# Patient Record
Sex: Female | Born: 1965 | Race: White | Hispanic: No | Marital: Married | Smoking: Never smoker
Health system: Southern US, Community
[De-identification: ages and names within clinical notes are randomized; demographics above are authoritative.]

## PROBLEM LIST (undated history)

## (undated) DIAGNOSIS — R51 Headache: Secondary | ICD-10-CM

## (undated) DIAGNOSIS — C801 Malignant (primary) neoplasm, unspecified: Secondary | ICD-10-CM

## (undated) DIAGNOSIS — R112 Nausea with vomiting, unspecified: Secondary | ICD-10-CM

## (undated) DIAGNOSIS — K219 Gastro-esophageal reflux disease without esophagitis: Secondary | ICD-10-CM

## (undated) DIAGNOSIS — J45909 Unspecified asthma, uncomplicated: Secondary | ICD-10-CM

## (undated) DIAGNOSIS — K625 Hemorrhage of anus and rectum: Secondary | ICD-10-CM

## (undated) DIAGNOSIS — M67439 Ganglion, unspecified wrist: Secondary | ICD-10-CM

## (undated) DIAGNOSIS — E7800 Pure hypercholesterolemia, unspecified: Secondary | ICD-10-CM

## (undated) DIAGNOSIS — T83711A Erosion of implanted vaginal mesh and other prosthetic materials to surrounding organ or tissue, initial encounter: Secondary | ICD-10-CM

## (undated) DIAGNOSIS — M359 Systemic involvement of connective tissue, unspecified: Secondary | ICD-10-CM

## (undated) DIAGNOSIS — I10 Essential (primary) hypertension: Secondary | ICD-10-CM

## (undated) DIAGNOSIS — M6530 Trigger finger, unspecified finger: Secondary | ICD-10-CM

## (undated) DIAGNOSIS — M1990 Unspecified osteoarthritis, unspecified site: Secondary | ICD-10-CM

## (undated) DIAGNOSIS — Z9889 Other specified postprocedural states: Secondary | ICD-10-CM

## (undated) DIAGNOSIS — M069 Rheumatoid arthritis, unspecified: Secondary | ICD-10-CM

## (undated) DIAGNOSIS — M797 Fibromyalgia: Secondary | ICD-10-CM

## (undated) HISTORY — DX: Hemorrhage of anus and rectum: K62.5

## (undated) HISTORY — DX: Rheumatoid arthritis, unspecified: M06.9

## (undated) HISTORY — DX: Pure hypercholesterolemia, unspecified: E78.00

## (undated) HISTORY — DX: Fibromyalgia: M79.7

## (undated) HISTORY — PX: OTHER SURGICAL HISTORY: SHX169

## (undated) HISTORY — DX: Unspecified osteoarthritis, unspecified site: M19.90

## (undated) HISTORY — PX: VAGINA RECONSTRUCTION SURGERY: SHX828

## (undated) HISTORY — DX: Essential (primary) hypertension: I10

---

## 1990-12-10 HISTORY — PX: TUBAL LIGATION: SHX77

## 1992-12-10 HISTORY — PX: ABDOMINAL HYSTERECTOMY: SHX81

## 1993-12-10 HISTORY — PX: ECTOPIC PREGNANCY SURGERY: SHX613

## 1994-12-10 HISTORY — PX: SALPINGOOPHORECTOMY: SHX82

## 1994-12-10 HISTORY — PX: APPENDECTOMY: SHX54

## 2001-08-04 ENCOUNTER — Encounter: Payer: Self-pay | Admitting: Emergency Medicine

## 2001-08-04 ENCOUNTER — Emergency Department (HOSPITAL_COMMUNITY): Admission: EM | Admit: 2001-08-04 | Discharge: 2001-08-04 | Payer: Self-pay | Admitting: Emergency Medicine

## 2001-08-20 ENCOUNTER — Emergency Department (HOSPITAL_COMMUNITY): Admission: EM | Admit: 2001-08-20 | Discharge: 2001-08-20 | Payer: Self-pay | Admitting: *Deleted

## 2001-08-20 ENCOUNTER — Encounter: Payer: Self-pay | Admitting: Emergency Medicine

## 2004-07-28 ENCOUNTER — Ambulatory Visit (HOSPITAL_COMMUNITY): Admission: RE | Admit: 2004-07-28 | Discharge: 2004-07-28 | Payer: Self-pay | Admitting: Family Medicine

## 2006-04-01 ENCOUNTER — Emergency Department (HOSPITAL_COMMUNITY): Admission: EM | Admit: 2006-04-01 | Discharge: 2006-04-01 | Payer: Self-pay | Admitting: Emergency Medicine

## 2006-07-24 ENCOUNTER — Emergency Department (HOSPITAL_COMMUNITY): Admission: EM | Admit: 2006-07-24 | Discharge: 2006-07-24 | Payer: Self-pay | Admitting: Emergency Medicine

## 2006-08-22 ENCOUNTER — Emergency Department (HOSPITAL_COMMUNITY): Admission: EM | Admit: 2006-08-22 | Discharge: 2006-08-22 | Payer: Self-pay | Admitting: Emergency Medicine

## 2006-09-06 ENCOUNTER — Observation Stay (HOSPITAL_COMMUNITY): Admission: RE | Admit: 2006-09-06 | Discharge: 2006-09-07 | Payer: Self-pay | Admitting: General Surgery

## 2006-09-06 HISTORY — PX: VENTRAL HERNIA REPAIR: SHX424

## 2006-12-31 ENCOUNTER — Ambulatory Visit (HOSPITAL_COMMUNITY): Admission: RE | Admit: 2006-12-31 | Discharge: 2006-12-31 | Payer: Self-pay | Admitting: Obstetrics & Gynecology

## 2007-01-02 ENCOUNTER — Ambulatory Visit (HOSPITAL_COMMUNITY): Admission: RE | Admit: 2007-01-02 | Discharge: 2007-01-02 | Payer: Self-pay | Admitting: Obstetrics and Gynecology

## 2007-04-01 ENCOUNTER — Ambulatory Visit (HOSPITAL_BASED_OUTPATIENT_CLINIC_OR_DEPARTMENT_OTHER): Admission: RE | Admit: 2007-04-01 | Discharge: 2007-04-01 | Payer: Self-pay | Admitting: Urology

## 2007-04-01 HISTORY — PX: CYSTOSCOPY WITH HYDRODISTENSION AND BIOPSY: SHX5127

## 2007-06-23 ENCOUNTER — Emergency Department (HOSPITAL_COMMUNITY): Admission: EM | Admit: 2007-06-23 | Discharge: 2007-06-23 | Payer: Self-pay | Admitting: Emergency Medicine

## 2008-01-08 ENCOUNTER — Other Ambulatory Visit: Admission: RE | Admit: 2008-01-08 | Discharge: 2008-01-08 | Payer: Self-pay | Admitting: Obstetrics and Gynecology

## 2008-02-12 ENCOUNTER — Emergency Department (HOSPITAL_COMMUNITY): Admission: EM | Admit: 2008-02-12 | Discharge: 2008-02-12 | Payer: Self-pay | Admitting: Emergency Medicine

## 2008-12-10 HISTORY — PX: PUBOVAGINAL SLING: SHX1035

## 2009-01-27 ENCOUNTER — Encounter (HOSPITAL_COMMUNITY): Admission: RE | Admit: 2009-01-27 | Discharge: 2009-02-26 | Payer: Self-pay | Admitting: Internal Medicine

## 2010-02-20 ENCOUNTER — Ambulatory Visit: Payer: Self-pay | Admitting: Orthopedic Surgery

## 2010-02-20 DIAGNOSIS — M25569 Pain in unspecified knee: Secondary | ICD-10-CM | POA: Insufficient documentation

## 2010-02-20 DIAGNOSIS — M069 Rheumatoid arthritis, unspecified: Secondary | ICD-10-CM | POA: Insufficient documentation

## 2010-02-20 DIAGNOSIS — IMO0001 Reserved for inherently not codable concepts without codable children: Secondary | ICD-10-CM | POA: Insufficient documentation

## 2010-02-20 LAB — CONVERTED CEMR LAB
Anti Nuclear Antibody(ANA): NEGATIVE
CRP: 1.2 mg/dL — ABNORMAL HIGH (ref <0.6)
Rhuematoid fact SerPl-aCnc: <20 intl units/mL (ref 0–20)
Sed Rate: 12 mm/hr (ref 0–22)

## 2010-02-21 ENCOUNTER — Telehealth: Payer: Self-pay | Admitting: Orthopedic Surgery

## 2010-02-22 ENCOUNTER — Encounter: Payer: Self-pay | Admitting: Orthopedic Surgery

## 2010-02-28 ENCOUNTER — Encounter (HOSPITAL_COMMUNITY): Admission: RE | Admit: 2010-02-28 | Discharge: 2010-03-30 | Payer: Self-pay | Admitting: Orthopedic Surgery

## 2010-03-01 ENCOUNTER — Telehealth: Payer: Self-pay | Admitting: Orthopedic Surgery

## 2010-03-09 ENCOUNTER — Ambulatory Visit: Payer: Self-pay | Admitting: Orthopedic Surgery

## 2010-03-10 ENCOUNTER — Encounter (INDEPENDENT_AMBULATORY_CARE_PROVIDER_SITE_OTHER): Payer: Self-pay | Admitting: *Deleted

## 2010-03-14 ENCOUNTER — Encounter: Payer: Self-pay | Admitting: Orthopedic Surgery

## 2010-03-15 ENCOUNTER — Encounter: Payer: Self-pay | Admitting: Orthopedic Surgery

## 2010-03-16 ENCOUNTER — Ambulatory Visit (HOSPITAL_COMMUNITY): Admission: RE | Admit: 2010-03-16 | Discharge: 2010-03-16 | Payer: Self-pay | Admitting: Internal Medicine

## 2010-03-23 ENCOUNTER — Ambulatory Visit (HOSPITAL_COMMUNITY): Admission: RE | Admit: 2010-03-23 | Discharge: 2010-03-23 | Payer: Self-pay | Admitting: Neurology

## 2010-04-04 ENCOUNTER — Telehealth: Payer: Self-pay | Admitting: Orthopedic Surgery

## 2010-04-24 ENCOUNTER — Telehealth: Payer: Self-pay | Admitting: Orthopedic Surgery

## 2011-01-09 NOTE — Progress Notes (Signed)
Summary: no reply for referral appointment  Phone Note Other Incoming   Summary of Call: Someone from [facility] ( did not leave a name) left a message that they have left messages for Debbrah Sampedro to call their office to schedule an appointment.  So far there has been bo reply to the messages Initial call taken by: Jacklynn Ganong,  April 04, 2010 4:19 PM

## 2011-01-09 NOTE — Assessment & Plan Note (Signed)
Summary: BILAT KNEE PAIN/FILMS HERE/?NEW XRAY/NOTES REV'D,APPR'D DR H/...   Visit Type:  Initial Consult  CC:  bilateral knee pian.  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  Patient reports LEFT knee pain worse than RIGHT for over a year.  She does get some relief from tramadol.  She complains of swelling in her knees with stabbing pain whic  It's  an 8/10 it is constant.  It's worse when she gets up in the morning and last until she goes to bed.  It came on gradually she seems to get relief by soaking her feet and the child were soaking in its or sitting or lying down it's worse with bending and getting on her knees and walking.  She has associated numbness and tingling.  Previous treatment includes anti-inflammatory medications which included Voltaren 75 mg.  She is on Ultram 50 mg.  She had bilateral injections and she said she couldn't walk for one week after the shots.  This made her unhappy and she wanted to transfer care at that point.  She had x-rays of both knees according to the 01/02/10 no  Medications: Clonazepam, Tramadol, V-dot.  Allergies (verified): 1)  ! Darvocet  Past History:  Past Medical History: IC  Past Surgical History: Cholecystectomy  Review of Systems Constitutional:  Complains of weight loss and fatigue; denies weight gain, fever, and chills. Cardiovascular:  Denies chest pain, palpitations, fainting, and murmurs. Respiratory:  Denies short of breath, wheezing, couch, tightness, pain on inspiration, and snoring . Gastrointestinal:  Complains of heartburn; denies nausea, vomiting, diarrhea, constipation, and blood in your stools. Genitourinary:  Complains of frequency and urgency; denies difficulty urinating, painful urination, flank pain, and bleeding in urine. Neurologic:  Complains of numbness and  tingling; denies unsteady gait, dizziness, tremors, and seizure. Musculoskeletal:  Complains of joint pain, swelling, and stiffness; denies instability, redness, heat, and muscle pain. Endocrine:  Denies excessive thirst, exessive urination, and heat or cold intolerance. Psychiatric:  Complains of depression; denies nervousness, anxiety, and hallucinations. Skin:  Denies changes in the skin, poor healing, rash, itching, and redness. HEENT:  Denies blurred or double vision, eye pain, redness, and watering. Immunology:  Complains of seasonal allergies; denies sinus problems and allergic to bee stings. Hemoatologic:  Denies easy bleeding and brusing.  Physical Exam  Additional Exam:   VS reviewed and were normal  GEN: appearance was normal body habitus was endomorphic, medium  CDV: normal pulses temperature and no edema  LYMPH nodes were normal   SKIN was normal   Neuro: normal sensation, reflexes were normal, balance was normal, coordination was normal Psyche: AAO x 3 and mood was normal   MSK *Gait was normal   Bilateral knee examination she was tender over both knee joints surrounding the patella and both joint lines and really just everywhere.  There was no swelling there was no fullness to the synovium.  Her range of motion was full she had normal strength there is no instability.  The only abnormal finding was that she went into hyperextension of about 10 with both knees.      Impression & Recommendations:  Problem # 1:  RHEUMATOID ARTHRITIS (ICD-714.0) Assessment New  Orders: T-Antinuclear Antib (ANA) [phone]) T-C-Reactive Protein [phone]) T-Rheumatoid Factor [phone]) T-Sed Rate (Automated) [phone]) New Patient Level III (28413)  Problem # 2:  KNEE PAIN (KGM-010.27) Assessment: New  Orders: New Patient Level III (16109)  Problem # 3:  FIBROMYALGIA (ICD-729.1) Assessment: New  differential diagnoses and assessment  The patient's x-rays  are normal I've seen him I do not see a reason for her pain.  She has no joint swelling.  I think she should at least have a rheumatoid panel, a bone scan and then reassess the situation after 12 days of a steroid Dosepak.  If no relief things reveal a diagnosis then I would get rheumatologic and neurologic consultations.  At this point though I do not see any surgical problem here area the patient says she is depressed because her mom recently past but her symptoms started before then.  Orders: New Patient Level III (60454)  Patient Instructions: 1)  Bone scan come back in 2 weeks 2)  Rheumatoid panel  3)  Prednisone dose pack

## 2011-01-09 NOTE — Progress Notes (Signed)
Summary: unable to contact patient  Phone Note Other Incoming   Summary of Call: Donnie/[facility] had been unable to reach Viviano Simas to schedule an appointment with Dr. Corliss Skains. Will shred all paperwork from our office. Initial call taken by: Jacklynn Ganong,  Apr 24, 2010 2:24 PM

## 2011-01-09 NOTE — Letter (Signed)
Summary: Previous notes brought by the patient  Previous notes brought by the patient   Imported By: Jacklynn Ganong 02/23/2010 08:18:01  _____________________________________________________________________  External Attachment:    Type:   Image     Comment:   External Document

## 2011-01-09 NOTE — Consult Note (Signed)
Summary: Consult notes from Dr. Gerilyn Pilgrim  Consult notes from Dr. Gerilyn Pilgrim   Imported By: Jacklynn Ganong 03/16/2010 15:48:21  _____________________________________________________________________  External Attachment:    Type:   Image     Comment:   External Document

## 2011-01-09 NOTE — Assessment & Plan Note (Signed)
Summary: bone scan results/bcbs/bsf    Visit Type:  Follow-up  CC:  bilateral knee pain.Marland Kitchen  History of Present Illness: see previous history for details but patient came in with bilateral knee pain, multiple joint aches, and numbness and tingling in her legs with some back pain as well  I sent her for rheumatology workup it was negative, I sent her for a bone scan and we only saw uptake in her shoulders.  I explained the results to her she did not have any other questions.  I advised her that she should see a rheumatologist to evaluate her for fibromyalgia.  She does not need any surgery at this time.  Data reviewed includes laboratory studies, bone scan with report.   Medications: Clonazepam, Tramadol, V-dot.    Allergies: 1)  ! Darvocet   Impression & Recommendations:  Problem # 1:  FIBROMYALGIA (ICD-729.1) Assessment Unchanged  Orders: Neurology Referral (Neuro) Rheumatology Referral (Rheumatology) Est. Patient Level II (36144)  Patient Instructions: 1)  Referral to Neurology and Rheumatology

## 2011-01-09 NOTE — Letter (Signed)
Summary: History form  History form   Imported By: [name] 02/23/2010 08:17:11  _____________________________________________________________________  External Attachment:    Type:   Image     Comment:   External Document

## 2011-01-09 NOTE — Progress Notes (Signed)
Summary: call from patient about results and to report back hurting  Phone Note Call from Patient   Caller: Patient Summary of Call: Patient called, left voice mail msg, relaying that her back is hurting after "they made me unload the truck at work".  States knows that she will be called with results of her bone scan.  Her ph# [phone]. Initial call taken by: Cammie Sickle,  March 01, 2010 3:34 PM  Follow-up for Phone Call        Patient Instructions: 1)  Bone scan come back in 2 weeks 2)  Rheumatoid panel  3)  Prednisone dose pack    not sure what this note means but she should be coming back wk of the 28th  Follow-up by: Fuller Canada MD,  March 02, 2010 7:54 AM  Additional Follow-up for Phone Call Additional follow up Details #1::        Appointment scheduled for 03/09/10,confirmed. Additional Follow-up by: Cammie Sickle,  March 03, 2010 4:04 PM

## 2011-01-09 NOTE — Medication Information (Signed)
Summary: Tax adviser   Imported By: Cammie Sickle 03/23/2010 08:47:31  _____________________________________________________________________  External Attachment:    Type:   Image     Comment:   External Document

## 2011-01-09 NOTE — Miscellaneous (Signed)
Summary: faxed notes for rheumatoid and neurologist referral  Clinical Lists Changes

## 2011-01-09 NOTE — Progress Notes (Signed)
Summary: having pain in knees and numbness in toes  Phone Note Call from Patient Call back at Home Phone [phone]   Summary of Call: patient states that she is having alot of pain with her knees and numbness in her toes since her examination yesterday, she started her prednisone pack this am, she just wanted to let you know Initial call taken by: Ether Griffins,  February 21, 2010 10:58 AM

## 2011-01-17 ENCOUNTER — Emergency Department (HOSPITAL_COMMUNITY)
Admission: EM | Admit: 2011-01-17 | Discharge: 2011-01-17 | Disposition: A | Discharge status: Home / Self Care | Payer: BC Managed Care – PPO | Attending: Emergency Medicine | Admitting: Emergency Medicine

## 2011-01-17 DIAGNOSIS — R110 Nausea: Secondary | ICD-10-CM | POA: Insufficient documentation

## 2011-01-17 DIAGNOSIS — Z79899 Other long term (current) drug therapy: Secondary | ICD-10-CM | POA: Insufficient documentation

## 2011-01-17 DIAGNOSIS — I2510 Atherosclerotic heart disease of native coronary artery without angina pectoris: Secondary | ICD-10-CM | POA: Insufficient documentation

## 2011-01-17 DIAGNOSIS — R339 Retention of urine, unspecified: Secondary | ICD-10-CM | POA: Insufficient documentation

## 2011-01-17 LAB — CBC
HCT: 31.5 % — ABNORMAL LOW (ref 36.0–46.0)
Hemoglobin: 10.9 g/dL — ABNORMAL LOW (ref 12.0–15.0)
MCH: 33.3 pg (ref 26.0–34.0)
MCHC: 34.6 g/dL (ref 30.0–36.0)
MCV: 96.3 fL (ref 78.0–100.0)
Platelets: 189 10*3/uL (ref 150–400)
RBC: 3.27 MIL/uL — ABNORMAL LOW (ref 3.87–5.11)
RDW: 13 % (ref 11.5–15.5)
WBC: 10.4 10*3/uL (ref 4.0–10.5)

## 2011-01-17 LAB — URINALYSIS, ROUTINE W REFLEX MICROSCOPIC
Bilirubin Urine: NEGATIVE
Hgb urine dipstick: NEGATIVE
Ketones, ur: NEGATIVE mg/dL
Nitrite: NEGATIVE
Protein, ur: NEGATIVE mg/dL
Specific Gravity, Urine: 1.015 (ref 1.005–1.030)
Urine Glucose, Fasting: NEGATIVE mg/dL
Urobilinogen, UA: 0.2 mg/dL (ref 0.0–1.0)
pH: 6 (ref 5.0–8.0)

## 2011-01-17 LAB — BASIC METABOLIC PANEL
BUN: 12 mg/dL (ref 6–23)
CO2: 28 mEq/L (ref 19–32)
Calcium: 8.8 mg/dL (ref 8.4–10.5)
Chloride: 105 mEq/L (ref 96–112)
Creatinine, Ser: 0.75 mg/dL (ref 0.4–1.2)
GFR calc Af Amer: >60 mL/min (ref >60)
GFR calc non Af Amer: >60 mL/min (ref >60)
Glucose, Bld: 110 mg/dL — ABNORMAL HIGH (ref 70–99)
Potassium: 4.4 mEq/L (ref 3.5–5.1)
Sodium: 139 mEq/L (ref 135–145)

## 2011-01-17 LAB — DIFFERENTIAL
Basophils Absolute: 0 10*3/uL (ref 0.0–0.1)
Basophils Relative: 0 % (ref 0–1)
Eosinophils Absolute: 0.1 10*3/uL (ref 0.0–0.7)
Eosinophils Relative: 1 % (ref 0–5)
Lymphocytes Relative: 30 % (ref 12–46)
Lymphs Abs: 3.1 10*3/uL (ref 0.7–4.0)
Monocytes Absolute: 0.7 10*3/uL (ref 0.1–1.0)
Monocytes Relative: 7 % (ref 3–12)
Neutro Abs: 6.5 10*3/uL (ref 1.7–7.7)
Neutrophils Relative %: 63 % (ref 43–77)

## 2011-02-16 ENCOUNTER — Ambulatory Visit (HOSPITAL_COMMUNITY)
Admission: RE | Admit: 2011-02-16 | Discharge: 2011-02-16 | Disposition: A | Discharge status: Home / Self Care | Payer: BC Managed Care – PPO | Source: Ambulatory Visit | Attending: Internal Medicine | Admitting: Internal Medicine

## 2011-02-16 ENCOUNTER — Other Ambulatory Visit (HOSPITAL_COMMUNITY): Payer: Self-pay | Admitting: Internal Medicine

## 2011-02-16 DIAGNOSIS — Z139 Encounter for screening, unspecified: Secondary | ICD-10-CM

## 2011-02-16 DIAGNOSIS — R0602 Shortness of breath: Secondary | ICD-10-CM | POA: Insufficient documentation

## 2011-03-20 ENCOUNTER — Ambulatory Visit (HOSPITAL_COMMUNITY)
Admission: RE | Admit: 2011-03-20 | Discharge: 2011-03-20 | Disposition: A | Discharge status: Home / Self Care | Payer: BC Managed Care – PPO | Source: Ambulatory Visit | Attending: Internal Medicine | Admitting: Internal Medicine

## 2011-03-20 DIAGNOSIS — Z1231 Encounter for screening mammogram for malignant neoplasm of breast: Secondary | ICD-10-CM | POA: Insufficient documentation

## 2011-03-20 DIAGNOSIS — Z139 Encounter for screening, unspecified: Secondary | ICD-10-CM

## 2011-04-24 NOTE — Procedures (Signed)
[name], [name]                ACCOUNT NO.:  [id number]   MEDICAL RECORD NO.:  [id number]          PATIENT TYPE:  REC   LOCATION:  RAD                           FACILITY:  [facility]   PHYSICIAN:  Kingsley Callander. Ouida Sills, MD       DATE OF BIRTH:  10/13/1966   DATE OF PROCEDURE:  DATE OF DISCHARGE:                                  STRESS TEST   MYOVIEW STRESS TEST REPORT   [name] underwent a Myoview stress test for evaluation of chest pain.  She exercised 10 minutes 2 seconds (1 minute 2 seconds into stage IV of  the Bruce protocol) attaining a maximal heart rate of 178 (99% of the  age-predicted maximal heart rate) at a workload of 12.8 METS and  discontinued exercise due to fatigue.  She experienced chest tightness  early in stage I.  Symptoms resolved during exercise.  There were no  arrhythmias.  There were no ST segment changes diagnostic of ischemia.  The baseline electrocardiogram revealed normal sinus rhythm at 74 beats  per minute with poor R-wave progression.   IMPRESSION:  No evidence of exercise-induced ischemia.  Myoview images  pending.      Kingsley Callander. Ouida Sills, MD  Electronically Signed     ROF/MEDQ  D:  01/27/2009  T:  01/27/2009  Job:  [id number]

## 2011-04-27 NOTE — Op Note (Signed)
[name], [name]                ACCOUNT NO.:  [id number]   MEDICAL RECORD NO.:  [id number]          PATIENT TYPE:  OBV   LOCATION:  A305                          FACILITY:  [facility]   PHYSICIAN:  Dalia Heading, M.D.  DATE OF BIRTH:  March 08, 1966   DATE OF PROCEDURE:  09/06/2006  DATE OF DISCHARGE:  09/07/2006                                 OPERATIVE REPORT   PREOPERATIVE DIAGNOSIS:  Ventral hernia.   POSTOPERATIVE DIAGNOSIS:  Ventral hernia, right spigelian hernia   PROCEDURE:  Ventral herniorrhaphy with mesh.   SURGEON:  Dr. Franky Macho   ANESTHESIA:  General endotracheal.   INDICATIONS:  The patient is a 45 year old white female who is referred for  evaluation and treatment of a right lower quadrant hernia.  It has been  present for several months and recently has been increasing in size and  causing her discomfort.  It appears to be a right spigelian hernia.  The  patient now comes to the operating room for a ventral herniorrhaphy with  mesh.  The risks and benefits of the procedure including bleeding,  infection, pain, recurrence of the hernia were fully explained to the  patient, gave informed consent.   PROCEDURE NOTE:  The patient was placed in the supine position.  After  induction of general endotracheal anesthesia, the abdomen was prepped and  draped in the usual sterile technique with Betadine.  Surgical site  confirmation was performed.   An oblique incision was made in the right lower quadrant region.  This was  taken down to the hernia defect.  The patient was noted to have  properitoneal fat herniating through the external oblique aponeurosis.  This  appeared be close to the semilunar line.  The excess adipose tissue was  excised and removed from the operative field.  A small 1.5 to 2 cm oblong  defect was noted in the external oblique.  This was repaired using a small  size ultra pro mesh.  The external portion of the mesh was secured to the  external oblique  aponeurosis using 2-0 Novofil interrupted sutures.  Subcutaneous layers were reapproximated using a 2-0 Vicryl interrupted  suture.  The skin was closed using staples.  0.5 cm Sensorcaine was  instilled into the surrounding wound.  Betadine ointment and dry sterile  dressing were applied.   All sponge and needle counts correct and the end of the procedure.  The  patient was extubated in the operating room, went back to recovery room  awake and in stable condition.  Complications none.   SPECIMEN:  Properitoneal fat, hernia contents.   BLOOD LOSS:  Minimal.      Dalia Heading, M.D.  Electronically Signed     MAJ/MEDQ  D:  09/06/2006  T:  09/08/2006  Job:  [id number]   cc:   Kirk Ruths, M.D.  Fax: [phone]   Dalia Heading, M.D.  Fax: [phone]

## 2011-04-27 NOTE — H&P (Signed)
NAMEJOVANI, [name]                ACCOUNT NO.:  [id number]   MEDICAL RECORD NO.:  [id number]          PATIENT TYPE:  AMB   LOCATION:  DAY                           FACILITY:  [facility]   PHYSICIAN:  Dalia Heading, M.D.  DATE OF BIRTH:  10/28/1966   DATE OF ADMISSION:  DATE OF DISCHARGE:  LH                                HISTORY & PHYSICAL   CHIEF COMPLAINT:  Ventral hernia.   HISTORY OF PRESENT ILLNESS:  The patient is a 45 year old white female who  is referred for and evaluation treatment of a right lower quadrant hernia.  It has been present for several months and recently has enlarged in size and  is causing her discomfort.  No nausea or vomiting had been noted.  It is  made worse with straining.   PAST MEDICAL HISTORY:  Unremarkable.   PAST SURGICAL HISTORY:  Hysterectomy, oophorectomy, laparoscopic  cholecystectomy, appendectomy.   CURRENT MEDICATIONS:  __________  1 tablet p.o. q.d.   ALLERGIES:  DARVOCET.   REVIEW OF SYSTEMS:  The patient denies drinking or smoking.   PHYSICAL EXAMINATION:  GENERAL:  The patient is a well-developed, well-  nourished white female in no acute distress.  She is a afebrile.  VITAL SIGNS:  Stable.  LUNGS:  Clear to auscultation with equal breath sounds bilaterally.  HEART:  Examination reveals regular a rate and rhythm without S3, S4,  murmurs.  ABDOMEN:  Soft, nontender, nondistended.  No hepatosplenomegaly or masses  noted.  Reducible right spigelian hernia is noted.   IMPRESSION:  Right spigelian hernia.   PLAN:  The patient is scheduled for ventral herniorrhaphy with mesh on  09/06/2006.  The risks and benefits of the procedure including bleeding,  infection, pain and recurrence of the hernia were fully explained to the  patient, gave informed consent.      Dalia Heading, M.D.  Electronically Signed     MAJ/MEDQ  D:  08/29/2006  T:  09/01/2006  Job:  [id number]   cc:   Dalia Heading, M.D.  Fax: [phone]   Kirk Ruths, M.D.  Fax: [phone]   Short Stay - [facility]

## 2011-04-27 NOTE — Op Note (Signed)
[name], [name]                ACCOUNT NO.:  [id number]   MEDICAL RECORD NO.:  [id number]          PATIENT TYPE:  AMB   LOCATION:  [facility]                         FACILITY:  [facility]   PHYSICIAN:  Jamison Neighbor, M.D.  DATE OF BIRTH:  [birth year]   DATE OF PROCEDURE:  04/01/2007  DATE OF DISCHARGE:                               OPERATIVE REPORT   PREOPERATIVE DIAGNOSES:  1. Microscopic hematuria.  2. Chronic pelvic pain consistent with interstitial cystitis.   POSTOPERATIVE DIAGNOSES:  1. Microscopic hematuria.  2. Chronic pelvic pain consistent with interstitial cystitis.   PROCEDURE:  Cystoscopy, urethral calibration, hydrodistention of the  bladder, Marcaine and Pyridium installation, Marcaine and Kenalog  injection.   SURGEON:  Dr. Logan Bores.   ANESTHESIA:  General.   COMPLICATIONS:  None.   DRAINS:  None.   BRIEF HISTORY:  This 45 year old female has had problems with urgency  and frequency as well as urgency incontinence.  She underwent urodynamic  testing.  This showed evidence of sensory urgency but no detrusor  instability.  The patient has reasonably good support for the urethra  and only a modest decrease in her leak-point pressure.  The patient also  has chronic pelvic pain and microscopic hematuria.  In order to  determine if she has interstitial cystitis, we made the decision to  proceed with cystoscopy, hydrodistention of the bladder, and retrograde  studies to evaluate the hematuria.  She understands the risks and  benefits of the procedure and gave full informed consent.   PROCEDURE:  After successful induction of general anesthesia, the  patient was placed in the dorsal lithotomy position, prepped with  Betadine and draped in the usual sterile fashion.  Careful bimanual  examination revealed a modest cystocele but not much in the way of  rectocele or vault prolapse.  There was some urethral hypermobility but  not significant prolapse.  The urethra was  calibrated and accepted a 63-  [place] female urethral sound with minimal stenosis and no stricturing.  The cystoscope was inserted.  The bladder was carefully inspected.  It  is free of any tumor or stones.  Both ureteral orifices are normal in  configuration and location.   Bilateral retrograde pyelograms were performed.  An acorn-tipped  catheter was placed into the right ureter.  Contrast was injected.  The  ureter was of normal caliber.  Some air bubbles did get in which caused  some irregularities at the upper portion of the ureter on the retrograde  pyelogram, but it was clear that there was no actual filling defect.  Collecting system itself was of normal caliber, very thin calyces, and  an intrarenal pelvis.  Drain-out films appeared normal, and it was felt  that that was a normal retrograde.  On the opposite side, the same  catheter was utilized.  The ureter was unremarkable in its appearance.  Collecting system was also free of any filling defect or obstruction,  and drain-out films were normal.  There certainly was no source for  hematuria on the retrograde studies.   Following completion of the retrograde, the patient underwent  hydrodistention of the bladder.  Bladder was distended at a pressure of  100 cm of water for 5 minutes.  When the bladder was drained,  granulations could be seen throughout the bladder consistent with  interstitial cystitis.  No ulcers could be seen.  There was nothing  requiring biopsy.  The bladder capacity was 600 mL which is almost  exactly the same as the average IC capacity under anesthesia of 575 and  significantly less than normal bladder capacity of 1150.  A mixture of  Marcaine and Pyridium was left in the bladder.  Marcaine and Kenalog  were injected periurethrally.  The patient tolerated the procedure and  was taken to the recovery in good condition.  She will be sent home with  Lorcet Plus, Pyridium Plus and doxycycline and return in  follow-up.  We  will then determine whether we should start her on instillation therapy,  but certainly she will be started on oral therapy for IC, and if she can  get that stabilized, we can certainly consider sling repair.           ______________________________  Jamison Neighbor, M.D.  Electronically Signed     RJE/MEDQ  D:  04/01/2007  T:  04/01/2007  Job:  [id number]

## 2011-07-09 ENCOUNTER — Other Ambulatory Visit (HOSPITAL_COMMUNITY): Payer: Self-pay | Admitting: Internal Medicine

## 2011-07-09 DIAGNOSIS — R52 Pain, unspecified: Secondary | ICD-10-CM

## 2011-07-10 ENCOUNTER — Ambulatory Visit (HOSPITAL_COMMUNITY): Payer: BC Managed Care – PPO

## 2011-07-10 ENCOUNTER — Ambulatory Visit (HOSPITAL_COMMUNITY)
Admission: RE | Admit: 2011-07-10 | Discharge: 2011-07-10 | Disposition: A | Discharge status: Home / Self Care | Payer: BC Managed Care – PPO | Source: Ambulatory Visit | Attending: Internal Medicine | Admitting: Internal Medicine

## 2011-07-10 DIAGNOSIS — N949 Unspecified condition associated with female genital organs and menstrual cycle: Secondary | ICD-10-CM | POA: Insufficient documentation

## 2011-07-10 DIAGNOSIS — R1031 Right lower quadrant pain: Secondary | ICD-10-CM | POA: Insufficient documentation

## 2011-07-10 DIAGNOSIS — R52 Pain, unspecified: Secondary | ICD-10-CM

## 2011-09-03 LAB — HEPATIC FUNCTION PANEL
ALT: 14
AST: 18
Albumin: 3.1 — ABNORMAL LOW
Alkaline Phosphatase: 51
Bilirubin, Direct: 0.1
Indirect Bilirubin: 0.8
Total Bilirubin: 0.9
Total Protein: 5.8 — ABNORMAL LOW

## 2012-02-18 ENCOUNTER — Other Ambulatory Visit: Payer: Self-pay | Admitting: Obstetrics & Gynecology

## 2012-02-18 DIAGNOSIS — Z139 Encounter for screening, unspecified: Secondary | ICD-10-CM

## 2012-03-21 ENCOUNTER — Ambulatory Visit (HOSPITAL_COMMUNITY)
Admission: RE | Admit: 2012-03-21 | Discharge: 2012-03-21 | Disposition: A | Discharge status: Home / Self Care | Payer: BC Managed Care – PPO | Source: Ambulatory Visit | Attending: Obstetrics & Gynecology | Admitting: Obstetrics & Gynecology

## 2012-03-21 DIAGNOSIS — Z1231 Encounter for screening mammogram for malignant neoplasm of breast: Secondary | ICD-10-CM | POA: Insufficient documentation

## 2012-03-21 DIAGNOSIS — Z139 Encounter for screening, unspecified: Secondary | ICD-10-CM

## 2012-12-10 DIAGNOSIS — M67439 Ganglion, unspecified wrist: Secondary | ICD-10-CM

## 2012-12-10 DIAGNOSIS — M6530 Trigger finger, unspecified finger: Secondary | ICD-10-CM

## 2012-12-10 HISTORY — DX: Ganglion, unspecified wrist: M67.439

## 2012-12-10 HISTORY — DX: Trigger finger, unspecified finger: M65.30

## 2012-12-11 ENCOUNTER — Encounter (HOSPITAL_BASED_OUTPATIENT_CLINIC_OR_DEPARTMENT_OTHER): Payer: Self-pay | Admitting: *Deleted

## 2012-12-17 ENCOUNTER — Encounter (HOSPITAL_BASED_OUTPATIENT_CLINIC_OR_DEPARTMENT_OTHER): Payer: Self-pay | Admitting: Anesthesiology

## 2012-12-17 ENCOUNTER — Ambulatory Visit (HOSPITAL_BASED_OUTPATIENT_CLINIC_OR_DEPARTMENT_OTHER): Payer: BC Managed Care – PPO | Admitting: Anesthesiology

## 2012-12-17 ENCOUNTER — Ambulatory Visit (HOSPITAL_BASED_OUTPATIENT_CLINIC_OR_DEPARTMENT_OTHER)
Admission: RE | Admit: 2012-12-17 | Discharge: 2012-12-17 | Disposition: A | Discharge status: Home / Self Care | Payer: BC Managed Care – PPO | Source: Ambulatory Visit | Attending: Orthopedic Surgery | Admitting: Orthopedic Surgery

## 2012-12-17 ENCOUNTER — Encounter (HOSPITAL_BASED_OUTPATIENT_CLINIC_OR_DEPARTMENT_OTHER): Payer: Self-pay | Admitting: Orthopedic Surgery

## 2012-12-17 ENCOUNTER — Encounter (HOSPITAL_BASED_OUTPATIENT_CLINIC_OR_DEPARTMENT_OTHER)
Admission: RE | Disposition: A | Discharge status: Home / Self Care | Payer: Self-pay | Source: Ambulatory Visit | Attending: Orthopedic Surgery

## 2012-12-17 DIAGNOSIS — M6530 Trigger finger, unspecified finger: Secondary | ICD-10-CM | POA: Insufficient documentation

## 2012-12-17 DIAGNOSIS — Z8261 Family history of arthritis: Secondary | ICD-10-CM | POA: Insufficient documentation

## 2012-12-17 DIAGNOSIS — J45909 Unspecified asthma, uncomplicated: Secondary | ICD-10-CM | POA: Insufficient documentation

## 2012-12-17 DIAGNOSIS — K219 Gastro-esophageal reflux disease without esophagitis: Secondary | ICD-10-CM | POA: Insufficient documentation

## 2012-12-17 DIAGNOSIS — M6740 Ganglion, unspecified site: Secondary | ICD-10-CM | POA: Insufficient documentation

## 2012-12-17 DIAGNOSIS — Z833 Family history of diabetes mellitus: Secondary | ICD-10-CM | POA: Insufficient documentation

## 2012-12-17 DIAGNOSIS — Z8249 Family history of ischemic heart disease and other diseases of the circulatory system: Secondary | ICD-10-CM | POA: Insufficient documentation

## 2012-12-17 HISTORY — DX: Gastro-esophageal reflux disease without esophagitis: K21.9

## 2012-12-17 HISTORY — DX: Headache: R51

## 2012-12-17 HISTORY — DX: Other specified postprocedural states: Z98.890

## 2012-12-17 HISTORY — PX: GANGLION CYST EXCISION: SHX1691

## 2012-12-17 HISTORY — DX: Nausea with vomiting, unspecified: R11.2

## 2012-12-17 HISTORY — DX: Erosion of implanted vaginal mesh to surrounding organ or tissue, initial encounter: T83.711A

## 2012-12-17 HISTORY — PX: TRIGGER FINGER RELEASE: SHX641

## 2012-12-17 HISTORY — DX: Ganglion, unspecified wrist: M67.439

## 2012-12-17 HISTORY — DX: Unspecified asthma, uncomplicated: J45.909

## 2012-12-17 HISTORY — DX: Trigger finger, unspecified finger: M65.30

## 2012-12-17 LAB — POCT HEMOGLOBIN-HEMACUE: Hemoglobin: 11.5 g/dL — ABNORMAL LOW (ref 12.0–15.0)

## 2012-12-17 SURGERY — RELEASE, A1 PULLEY, FOR TRIGGER FINGER
Anesthesia: General | Site: Wrist | Laterality: Left | Wound class: Clean

## 2012-12-17 MED ORDER — OXYCODONE HCL 5 MG/5ML PO SOLN: 5.0000 mg | Freq: Once | ORAL | Status: DC | PRN

## 2012-12-17 MED ORDER — HYDROCODONE-IBUPROFEN 7.5-200 MG PO TABS: 1.0000 | ORAL_TABLET | Freq: Three times a day (TID) | ORAL | Status: DC | PRN

## 2012-12-17 MED ORDER — BUPIVACAINE HCL (PF) 0.25 % IJ SOLN
INTRAMUSCULAR | Status: DC | PRN
  Administered 2012-12-17: 8 mL

## 2012-12-17 MED ORDER — MIDAZOLAM HCL 5 MG/5ML IJ SOLN
INTRAMUSCULAR | Status: DC | PRN
  Administered 2012-12-17: 2 mg via INTRAVENOUS

## 2012-12-17 MED ORDER — ONDANSETRON HCL 4 MG/2ML IJ SOLN
INTRAMUSCULAR | Status: DC | PRN
  Administered 2012-12-17: 4 mg via INTRAVENOUS

## 2012-12-17 MED ORDER — PROPOFOL 10 MG/ML IV BOLUS
INTRAVENOUS | Status: DC | PRN
  Administered 2012-12-17: 150 mg via INTRAVENOUS

## 2012-12-17 MED ORDER — OXYCODONE HCL 5 MG PO TABS: 5.0000 mg | ORAL_TABLET | Freq: Once | ORAL | Status: DC | PRN

## 2012-12-17 MED ORDER — CITRIC ACID-SODIUM CITRATE 334-500 MG/5ML PO SOLN: 30.0000 mL | Freq: Once | ORAL | Status: DC

## 2012-12-17 MED ORDER — SCOPOLAMINE 1 MG/3DAYS TD PT72
1.0000 | MEDICATED_PATCH | Freq: Once | TRANSDERMAL | Status: DC
  Administered 2012-12-17: 1.5 mg via TRANSDERMAL

## 2012-12-17 MED ORDER — HYDROMORPHONE HCL PF 1 MG/ML IJ SOLN
0.2500 mg | INTRAMUSCULAR | Status: DC | PRN
  Administered 2012-12-17: 0.5 mg via INTRAVENOUS
  Administered 2012-12-17: 0.25 mg via INTRAVENOUS
  Administered 2012-12-17: 0.5 mg via INTRAVENOUS

## 2012-12-17 MED ORDER — PROMETHAZINE HCL 25 MG/ML IJ SOLN: 6.2500 mg | INTRAMUSCULAR | Status: DC | PRN

## 2012-12-17 MED ORDER — MIDAZOLAM HCL 2 MG/2ML IJ SOLN: 1.0000 mg | INTRAMUSCULAR | Status: DC | PRN

## 2012-12-17 MED ORDER — DEXAMETHASONE SODIUM PHOSPHATE 4 MG/ML IJ SOLN
INTRAMUSCULAR | Status: DC | PRN
  Administered 2012-12-17: 10 mg via INTRAVENOUS

## 2012-12-17 MED ORDER — ACETAMINOPHEN 10 MG/ML IV SOLN
1000.0000 mg | Freq: Once | INTRAVENOUS | Status: AC
  Administered 2012-12-17: 1000 mg via INTRAVENOUS

## 2012-12-17 MED ORDER — LACTATED RINGERS IV SOLN
INTRAVENOUS | Status: DC
  Administered 2012-12-17: 20 mL/h via INTRAVENOUS
  Administered 2012-12-17 (×2): via INTRAVENOUS

## 2012-12-17 MED ORDER — LIDOCAINE HCL (CARDIAC) 20 MG/ML IV SOLN
INTRAVENOUS | Status: DC | PRN
  Administered 2012-12-17: 75 mg via INTRAVENOUS

## 2012-12-17 MED ORDER — CHLORHEXIDINE GLUCONATE 4 % EX LIQD
60.0000 mL | Freq: Once | CUTANEOUS | Status: AC
  Administered 2012-12-17: 4 via TOPICAL

## 2012-12-17 MED ORDER — CITRIC ACID-SODIUM CITRATE 334-500 MG/5ML PO SOLN
30.0000 mL | Freq: Once | ORAL | Status: AC
  Administered 2012-12-17: 30 mL via ORAL

## 2012-12-17 MED ORDER — FENTANYL CITRATE 0.05 MG/ML IJ SOLN
INTRAMUSCULAR | Status: DC | PRN
  Administered 2012-12-17: 50 ug via INTRAVENOUS

## 2012-12-17 MED ORDER — FENTANYL CITRATE 0.05 MG/ML IJ SOLN: 50.0000 ug | Freq: Once | INTRAMUSCULAR | Status: DC

## 2012-12-17 MED ORDER — CEFAZOLIN SODIUM-DEXTROSE 2-3 GM-% IV SOLR
2.0000 g | INTRAVENOUS | Status: AC
  Administered 2012-12-17: 2 g via INTRAVENOUS

## 2012-12-17 SURGICAL SUPPLY — 47 items
BANDAGE COBAN STERILE 2 (GAUZE/BANDAGES/DRESSINGS) ×2 IMPLANT
BANDAGE GAUZE ELAST BULKY 4 IN (GAUZE/BANDAGES/DRESSINGS) ×3 IMPLANT
BLADE MINI RND TIP GREEN BEAV (BLADE) IMPLANT
BLADE SURG 15 STRL LF DISP TIS (BLADE) ×2 IMPLANT
BLADE SURG 15 STRL SS (BLADE) ×3
BNDG CMPR 9X4 STRL LF SNTH (GAUZE/BANDAGES/DRESSINGS) ×2
BNDG COHESIVE 3X5 TAN STRL LF (GAUZE/BANDAGES/DRESSINGS) ×3 IMPLANT
BNDG ESMARK 4X9 LF (GAUZE/BANDAGES/DRESSINGS) ×1 IMPLANT
CHLORAPREP W/TINT 26ML (MISCELLANEOUS) ×3 IMPLANT
CLOTH BEACON ORANGE TIMEOUT ST (SAFETY) ×3 IMPLANT
CORDS BIPOLAR (ELECTRODE) ×3 IMPLANT
COVER MAYO STAND STRL (DRAPES) ×3 IMPLANT
COVER TABLE BACK 60X90 (DRAPES) ×3 IMPLANT
CUFF TOURNIQUET SINGLE 18IN (TOURNIQUET CUFF) ×1 IMPLANT
DECANTER SPIKE VIAL GLASS SM (MISCELLANEOUS) IMPLANT
DRAPE EXTREMITY T 121X128X90 (DRAPE) ×3 IMPLANT
DRAPE SURG 17X23 STRL (DRAPES) ×3 IMPLANT
GAUZE XEROFORM 1X8 LF (GAUZE/BANDAGES/DRESSINGS) ×3 IMPLANT
GLOVE BIO SURGEON STRL SZ 6.5 (GLOVE) ×2 IMPLANT
GLOVE BIOGEL PI IND STRL 7.0 (GLOVE) IMPLANT
GLOVE BIOGEL PI IND STRL 8 (GLOVE) IMPLANT
GLOVE BIOGEL PI IND STRL 8.5 (GLOVE) ×2 IMPLANT
GLOVE BIOGEL PI INDICATOR 7.0 (GLOVE) ×1
GLOVE BIOGEL PI INDICATOR 8 (GLOVE) ×1
GLOVE BIOGEL PI INDICATOR 8.5 (GLOVE) ×1
GLOVE SURG ORTHO 8.0 STRL STRW (GLOVE) ×3 IMPLANT
GOWN BRE IMP PREV XXLGXLNG (GOWN DISPOSABLE) ×3 IMPLANT
GOWN PREVENTION PLUS XLARGE (GOWN DISPOSABLE) ×3 IMPLANT
NEEDLE 27GAX1X1/2 (NEEDLE) ×3 IMPLANT
NS IRRIG 1000ML POUR BTL (IV SOLUTION) ×3 IMPLANT
PACK BASIN DAY SURGERY FS (CUSTOM PROCEDURE TRAY) ×3 IMPLANT
PAD CAST 3X4 CTTN HI CHSV (CAST SUPPLIES) ×2 IMPLANT
PADDING CAST ABS 4INX4YD NS (CAST SUPPLIES) ×1
PADDING CAST ABS COTTON 4X4 ST (CAST SUPPLIES) ×2 IMPLANT
PADDING CAST COTTON 3X4 STRL (CAST SUPPLIES) ×3
SPLINT PLASTER CAST XFAST 3X15 (CAST SUPPLIES) ×10 IMPLANT
SPLINT PLASTER XTRA FASTSET 3X (CAST SUPPLIES) ×5
SPONGE GAUZE 4X4 12PLY (GAUZE/BANDAGES/DRESSINGS) ×3 IMPLANT
STOCKINETTE 4X48 STRL (DRAPES) ×3 IMPLANT
SUT VIC AB 4-0 P2 18 (SUTURE) IMPLANT
SUT VICRYL 4-0 PS2 18IN ABS (SUTURE) IMPLANT
SUT VICRYL RAPIDE 4/0 PS 2 (SUTURE) ×3 IMPLANT
SYR BULB 3OZ (MISCELLANEOUS) ×3 IMPLANT
SYR CONTROL 10ML LL (SYRINGE) ×3 IMPLANT
TOWEL OR 17X24 6PK STRL BLUE (TOWEL DISPOSABLE) ×6 IMPLANT
UNDERPAD 30X30 INCONTINENT (UNDERPADS AND DIAPERS) ×3 IMPLANT
WATER STERILE IRR 1000ML POUR (IV SOLUTION) ×3 IMPLANT

## 2012-12-17 NOTE — Transfer of Care (Signed)
Immediate Anesthesia Transfer of Care Note  Patient: [name]  Procedure(s) Performed: Procedure(s) (LRB) with comments: RELEASE TRIGGER FINGER/A-1 PULLEY (Left) REMOVAL GANGLION OF WRIST (Left)  Patient Location: PACU  Anesthesia Type:General  Level of Consciousness: sedated and patient cooperative  Airway & Oxygen Therapy: Patient Spontanous Breathing and Patient connected to face mask oxygen  Post-op Assessment: Report given to PACU RN and Post -op Vital signs reviewed and stable  Post vital signs: Reviewed and stable  Complications: No apparent anesthesia complications

## 2012-12-17 NOTE — Op Note (Signed)
Dictation Number [id number]

## 2012-12-17 NOTE — Anesthesia Postprocedure Evaluation (Signed)
  Anesthesia Post-op Note  Patient: [name]  Procedure(s) Performed: Procedure(s) (LRB) with comments: RELEASE TRIGGER FINGER/A-1 PULLEY (Left) REMOVAL GANGLION OF WRIST (Left)  Patient Location: PACU  Anesthesia Type:General  Level of Consciousness: awake and alert   Airway and Oxygen Therapy: Patient Spontanous Breathing  Post-op Pain: mild  Post-op Assessment: Post-op Vital signs reviewed, Patient's Cardiovascular Status Stable, Respiratory Function Stable, Patent Airway, No signs of Nausea or vomiting, Adequate PO intake and Pain level controlled  Post-op Vital Signs: stable  Complications: No apparent anesthesia complications

## 2012-12-17 NOTE — Brief Op Note (Signed)
12/17/2012  10:36 AM  PATIENT:  [name]  47 y.o. female  PRE-OPERATIVE DIAGNOSIS:  STS LMF DORSAL CYST LEFT WRIST  POST-OPERATIVE DIAGNOSIS:  STS LMF DORSAL CYST LEFT WRIST  PROCEDURE:  Procedure(s) (LRB) with comments: RELEASE TRIGGER FINGER/A-1 PULLEY (Left) REMOVAL GANGLION OF WRIST (Left)  SURGEON:  Surgeon(s) and Role:    * Nicki Reaper, MD - Primary  PHYSICIAN ASSISTANT:   ASSISTANTS: none   ANESTHESIA:   local and general  EBL:     BLOOD ADMINISTERED:none  DRAINS: none   LOCAL MEDICATIONS USED:  MARCAINE     SPECIMEN:  Excision  DISPOSITION OF SPECIMEN:  PATHOLOGY  COUNTS:  YES  TOURNIQUET:   Total Tourniquet Time Documented: Upper Arm (Right) - 23 minutes  DICTATION: .Other Dictation: Dictation Number [id number]  PLAN OF CARE: Discharge to home after PACU  PATIENT DISPOSITION:  PACU - hemodynamically stable.

## 2012-12-17 NOTE — Anesthesia Preprocedure Evaluation (Signed)
Anesthesia Evaluation  Patient identified by MRN, date of birth, ID band Patient awake    Reviewed: Allergy & Precautions, H&P , NPO status , Patient's Chart, lab work & pertinent test results  History of Anesthesia Complications (+) PONV  Airway Mallampati: II TM Distance: >3 FB Neck ROM: Full    Dental   Pulmonary asthma ,  breath sounds clear to auscultation        Cardiovascular Rhythm:Regular Rate:Normal     Neuro/Psych  Headaches,    GI/Hepatic GERD-  ,  Endo/Other    Renal/GU      Musculoskeletal  (+) Fibromyalgia -  Abdominal   Peds  Hematology   Anesthesia Other Findings   Reproductive/Obstetrics                           Anesthesia Physical Anesthesia Plan  ASA: II  Anesthesia Plan: General   Post-op Pain Management:    Induction: Intravenous  Airway Management Planned: LMA  Additional Equipment:   Intra-op Plan:   Post-operative Plan: Extubation in OR  Informed Consent: I have reviewed the patients History and Physical, chart, labs and discussed the procedure including the risks, benefits and alternatives for the proposed anesthesia with the patient or authorized representative who has indicated his/her understanding and acceptance.     Plan Discussed with: CRNA and Surgeon  Anesthesia Plan Comments:         Anesthesia Quick Evaluation

## 2012-12-17 NOTE — Anesthesia Procedure Notes (Signed)
Procedure Name: LMA Insertion Date/Time: 12/17/2012 10:02 AM Performed by: Gar Gibbon Pre-anesthesia Checklist: Patient identified, Emergency Drugs available, Suction available and Patient being monitored Patient Re-evaluated:Patient Re-evaluated prior to inductionOxygen Delivery Method: Circle System Utilized Preoxygenation: Pre-oxygenation with 100% oxygen Intubation Type: IV induction Ventilation: Mask ventilation without difficulty LMA: LMA with gastric port inserted LMA Size: 4.0 Number of attempts: 1 Placement Confirmation: positive ETCO2 Tube secured with: Tape Dental Injury: Teeth and Oropharynx as per pre-operative assessment

## 2012-12-17 NOTE — H&P (Signed)
[name] is a 47 year-old right-hand dominant female with a mass dorsal aspect left wrist.  This has been present for several months.  It has been increasing in size . It causes a moderate amount of discomfort. She complains of numbness of her middle finger.  She suffered an injury to her fourth finger and has been treated by [place] and was told she had a fracture.  She is not wearing a splint at the present time.  She has no history of diabetes, thyroid problems, arthritis or gout.  There is family history of diabetes, arthritis and gout.  She complains of intermittent, moderate to severe, sharp, dull, stabbing, throbbing, burning pain all of the time with a feeling of numbness and weakness.  She states it is getting worse.  Activity, exercise and work make this worse.  Rest has helped.  She has been wearing a brace which helps her work. She states that a ladder fell on it over the weekend. She is complaining of discomfort on the dorsal aspect.  X-rays reveal some irregularity along the dorsal what appears to be capitate or hamate distally. AP and lateral shows no discrete fracture. She does have the dorsal wrist ganglion and triggering of her left middle finger left wrist and hand.  She has had her nerve conductions done and these are entirely normal  ALLERGIES:    Darvocet and latex.  MEDICATIONS:    Oxcarbazepine, gabapentin and ibuprofen PRN headaches.  SURGICAL HISTORY:    She has had five surgeries in the past two years, 20 over her lifetime including hernia repairs, multiple GYN surgeries, a tubal pregnancy, bladder reconstruction.  FAMILY MEDICAL HISTORY:     Positive for diabetes, heart disease, high blood pressure and arthritis.  SOCIAL HISTORY:     She does not smoke or drink.  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   REVIEW OF SYSTEMS:   Positive for cancer, glasses, ringing in her ears, headaches, otherwise negative 14 points.  [name]  is an 47 y.o. female.   Chief Complaint: trigger long finger and dorsal cyst leeft HPI: see above  Past Medical History  Diagnosis Date  . Headache     tension/migraines  . GERD (gastroesophageal reflux disease)     TUMS as needed  . Asthma     triggered by weather changes; no inhaler  . 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   . PONV (postoperative nausea and vomiting)     also states stopped breathing after hernia surgery  . History of uterine cancer   . Stenosing tenosynovitis of finger 12/2012    left middle finger  . Ganglion cyst of wrist 12/2012    left dorsal cyst    Past Surgical History  Procedure Date  . Pubovaginal sling 2010  . Vagina reconstruction surgery     x 5 since 2010  . Abdominal hysterectomy 1994  . Ectopic pregnancy surgery 1995    left tube and ovary removed  . Appendectomy 1996  . Tubal ligation 1992  . Salpingoophorectomy 1996    right  . Ventral hernia repair 09/06/2006  . Cystoscopy with hydrodistension and biopsy 04/01/2007    History reviewed. No pertinent family history. Social History:  reports that she has never smoked. She has never used smokeless tobacco. She reports that she does not drink alcohol or use illicit drugs.  Allergies:  Allergies  Allergen Reactions  . Latex Rash  . Propoxyphene-Acetaminophen Nausea And Vomiting  and Rash    No prescriptions prior to admission    No results found for this or any previous visit (from the past 48 hour(s)).  No results found.   Pertinent items are noted in HPI.  Height 5\' 4"  (1.626 m), weight 78.472 kg (173 lb).  General appearance: alert, cooperative and appears stated age Head: Normocephalic, without obvious abnormality Neck: no adenopathy Resp: clear to auscultation bilaterally Cardio: regular rate and rhythm, S1, S2 normal, no murmur, click, rub or gallop GI: soft, non-tender; bowel sounds normal; no masses,  no organomegaly Extremities: extremities normal, atraumatic, no  cyanosis or edema Pulses: 2+ and symmetric Skin: Skin color, texture, turgor normal. No rashes or lesions Neurologic: Grossly normal Incision/Wound: na  Assessment/Plan e have discussed the possibility of releasing the A-1 pulley middle finger, excision of the dorsal wrist ganglion. She would like to go ahead and have that done. She is scheduled for excision dorsal wrist ganglion left wrist with release A-1 pulley left middle finger. The pre, peri and post op course are discussed along with risks and complications.  She is aware there is no guarantee with surgery, possibility of infection, recurrence, injury to arteries, nerves and tendons, incomplete relief of symptoms and dystrophy.  She will be scheduled for excision left dorsal wrist ganglion, STS left middle finger.  Hildegarde Dunaway R 12/17/2012, 8:04 AM

## 2012-12-18 ENCOUNTER — Encounter (HOSPITAL_BASED_OUTPATIENT_CLINIC_OR_DEPARTMENT_OTHER): Payer: Self-pay | Admitting: Orthopedic Surgery

## 2012-12-18 NOTE — Op Note (Signed)
[name], [name]                ACCOUNT NO.:  [id number]  MEDICAL RECORD NO.:  [id number]  LOCATION:                               FACILITY:  [facility]  PHYSICIAN:  Cindee Salt, M.D.            DATE OF BIRTH:  DATE OF PROCEDURE:  12/17/2012 DATE OF DISCHARGE:  12/17/2012                              OPERATIVE REPORT   PREOPERATIVE DIAGNOSIS:  Trigger finger left middle finger, ganglion cyst, dorsal left wrist.  POSTOPERATIVE DIAGNOSIS:  Trigger finger left middle finger, ganglion cyst, dorsal left wrist.  OPERATION:  Release A1 pulley, left long finger with excision of dorsal wrist ganglion, left wrist.  SURGEON:  Cindee Salt, MD  ANESTHESIA:  General with local infiltration.  ANESTHESIOLOGIST:  Bedelia Person, M.D.  HISTORY:  The patient is a 47 year old female with a history of a large cyst dorsal aspect left wrist with triggering left middle finger.  This did not responded to conservative treatment.  She has elected to undergo surgical release of the A1 pulley of left arm middle finger and excision of dorsal wrist ganglion.  She is scheduled as an outpatient.  She is aware that there is no guarantee with surgery; possibility of infection; recurrence of injury to arteries, nerves, tendons, incomplete relief of symptoms, dystrophy.  In the preoperative area, the patient is seen, the extremity marked by both the patient and surgeon.  Antibiotic given.  PROCEDURE:  The patient was brought to the operating room where a general anesthetic was carried out without difficulty.  She was prepped using ChloraPrep, supine in position, left arm free.  A 3-minute dry time was allowed.  Time-out taken, confirming patient and procedure.  An oblique incision was made over the A1 pulley left middle finger carried down through subcutaneous tissue.  Bleeders were electrocauterized with bipolar.  Neurovascular structures protected with retractors the A1 pulley was released on its radial aspect small  incision was made centrally in A2 and 8 partial tenosynovectomy was performed proximally of the finger placed through full range motion, no further triggering was noted.  The wound was irrigated and closed with interrupted 4-0 Vicryl deep sutures.  A transverse incision was then made over the mass, dorsal aspect of her left wrist carried down through subcutaneous tissue.  Bleeders again electrocauterized with bipolar.  A large cystic lesion was immediately encountered with blunt and sharp dissection this was dissected free and found to arise from the ulnar aspect of her wrist just volar to the dorsal radiocarpal ligament.  This area was debrided. The wound irrigated the opening closed with figure-of-eight 4-0 Vicryl sutures.  The retinaculum closed with interrupted 4-0 Vicryl, the subcutaneous tissue with interrupted 4-0 Vicryl and the skin with subcuticular 4-0 Vicryl Rapide suture.  A local infiltration with 0.25% Marcaine without epinephrine was given to each incision.  A 8 mL was used.  Sterile compressive dressing, volar splint with the fingers free applied.  On deflation of the tourniquet, all fingers immediately pinked.  She was taken to the recovery room for observation in satisfactory condition.  She will be discharged home from current [facility] in 1 week on Vicodin.  ______________________________ Cindee Salt, M.D.     GK/MEDQ  D:  12/17/2012  T:  12/18/2012  Job:  [id number]

## 2013-02-11 ENCOUNTER — Other Ambulatory Visit (HOSPITAL_COMMUNITY): Payer: Self-pay | Admitting: Internal Medicine

## 2013-02-11 DIAGNOSIS — IMO0001 Reserved for inherently not codable concepts without codable children: Secondary | ICD-10-CM

## 2013-03-23 ENCOUNTER — Ambulatory Visit (HOSPITAL_COMMUNITY)
Admission: RE | Admit: 2013-03-23 | Discharge: 2013-03-23 | Disposition: A | Discharge status: Home / Self Care | Payer: BC Managed Care – PPO | Source: Ambulatory Visit | Attending: Internal Medicine | Admitting: Internal Medicine

## 2013-03-23 DIAGNOSIS — Z1231 Encounter for screening mammogram for malignant neoplasm of breast: Secondary | ICD-10-CM | POA: Insufficient documentation

## 2013-03-23 DIAGNOSIS — IMO0001 Reserved for inherently not codable concepts without codable children: Secondary | ICD-10-CM

## 2013-04-29 ENCOUNTER — Other Ambulatory Visit (INDEPENDENT_AMBULATORY_CARE_PROVIDER_SITE_OTHER): Payer: Self-pay | Admitting: *Deleted

## 2013-04-29 ENCOUNTER — Encounter (INDEPENDENT_AMBULATORY_CARE_PROVIDER_SITE_OTHER): Payer: Self-pay

## 2013-04-29 ENCOUNTER — Encounter (HOSPITAL_COMMUNITY): Payer: Self-pay | Admitting: Pharmacy Technician

## 2013-04-29 ENCOUNTER — Telehealth (INDEPENDENT_AMBULATORY_CARE_PROVIDER_SITE_OTHER): Payer: Self-pay | Admitting: *Deleted

## 2013-04-29 DIAGNOSIS — K625 Hemorrhage of anus and rectum: Secondary | ICD-10-CM

## 2013-04-29 DIAGNOSIS — Z800 Family history of malignant neoplasm of digestive organs: Secondary | ICD-10-CM

## 2013-04-29 MED ORDER — SOD PHOS MONO-SOD PHOS DIBASIC 1.102-0.398 G PO TABS: 1.0000 | ORAL_TABLET | Freq: Once | ORAL | Status: DC

## 2013-04-29 NOTE — Telephone Encounter (Signed)
  Procedure: tcs  Reason/Indication:  Rectal bleeding, fam hx colon ca  Has patient had this procedure before?  no  If so, when, by whom and where?    Is there a family history of colon cancer?  Yes, maternal grandmother  Who?  What age when diagnosed?    Is patient diabetic?   no      Does patient have prosthetic heart valve?  no  Do you have a pacemaker?  no  Has patient ever had endocarditis? no  Has patient had joint replacement within last 12 months?  no  Is patient on Coumadin, Plavix and/or Aspirin? no  Medications: ibuprofen prn, neurotin 900 mg daily  Allergies: darvocet, cymbalta, imitrex  Medication Adjustment:   Procedure date & time: 05/08/13 at 240

## 2013-04-29 NOTE — Telephone Encounter (Signed)
agree

## 2013-05-08 ENCOUNTER — Encounter (HOSPITAL_COMMUNITY): Payer: Self-pay | Admitting: *Deleted

## 2013-05-08 ENCOUNTER — Ambulatory Visit (HOSPITAL_COMMUNITY)
Admission: RE | Admit: 2013-05-08 | Discharge: 2013-05-08 | Disposition: A | Discharge status: Home / Self Care | Payer: BC Managed Care – PPO | Source: Ambulatory Visit | Attending: Internal Medicine | Admitting: Internal Medicine

## 2013-05-08 ENCOUNTER — Encounter (HOSPITAL_COMMUNITY)
Admission: RE | Disposition: A | Discharge status: Home / Self Care | Payer: Self-pay | Source: Ambulatory Visit | Attending: Internal Medicine

## 2013-05-08 DIAGNOSIS — Z800 Family history of malignant neoplasm of digestive organs: Secondary | ICD-10-CM

## 2013-05-08 DIAGNOSIS — K625 Hemorrhage of anus and rectum: Secondary | ICD-10-CM

## 2013-05-08 DIAGNOSIS — K644 Residual hemorrhoidal skin tags: Secondary | ICD-10-CM

## 2013-05-08 DIAGNOSIS — Z85038 Personal history of other malignant neoplasm of large intestine: Secondary | ICD-10-CM | POA: Insufficient documentation

## 2013-05-08 DIAGNOSIS — D126 Benign neoplasm of colon, unspecified: Secondary | ICD-10-CM | POA: Insufficient documentation

## 2013-05-08 DIAGNOSIS — K921 Melena: Secondary | ICD-10-CM | POA: Insufficient documentation

## 2013-05-08 HISTORY — PX: COLONOSCOPY: SHX5424

## 2013-05-08 SURGERY — COLONOSCOPY
Anesthesia: Moderate Sedation

## 2013-05-08 MED ORDER — MIDAZOLAM HCL 5 MG/5ML IJ SOLN
INTRAMUSCULAR | Status: DC | PRN
  Administered 2013-05-08: 3 mg via INTRAVENOUS
  Administered 2013-05-08: 2 mg via INTRAVENOUS
  Administered 2013-05-08: 1 mg via INTRAVENOUS
  Administered 2013-05-08 (×2): 2 mg via INTRAVENOUS

## 2013-05-08 MED ORDER — SODIUM CHLORIDE 0.9 % IV SOLN
INTRAVENOUS | Status: DC
  Administered 2013-05-08: 1000 mL via INTRAVENOUS

## 2013-05-08 MED ORDER — STERILE WATER FOR IRRIGATION IR SOLN
Status: DC | PRN
  Administered 2013-05-08: 15:00:00

## 2013-05-08 MED ORDER — MEPERIDINE HCL 50 MG/ML IJ SOLN
INTRAMUSCULAR | Status: DC | PRN
  Administered 2013-05-08 (×2): 25 mg via INTRAVENOUS

## 2013-05-08 MED ORDER — ONDANSETRON HCL 4 MG/2ML IJ SOLN
INTRAMUSCULAR | Status: DC | PRN
  Administered 2013-05-08: 4 mg via INTRAVENOUS

## 2013-05-08 MED ORDER — ONDANSETRON HCL 4 MG/2ML IJ SOLN
INTRAMUSCULAR | Status: AC
  Filled 2013-05-08: qty 2

## 2013-05-08 MED ORDER — MEPERIDINE HCL 50 MG/ML IJ SOLN
INTRAMUSCULAR | Status: AC
  Filled 2013-05-08: qty 1

## 2013-05-08 MED ORDER — HYDROCORTISONE ACETATE 25 MG RE SUPP: 25.0000 mg | Freq: Every day | RECTAL | Status: DC

## 2013-05-08 MED ORDER — HYDROCODONE-ACETAMINOPHEN 5-300 MG PO TABS: 1.0000 | ORAL_TABLET | Freq: Three times a day (TID) | ORAL | Status: DC | PRN

## 2013-05-08 MED ORDER — MIDAZOLAM HCL 5 MG/5ML IJ SOLN
INTRAMUSCULAR | Status: AC
  Filled 2013-05-08: qty 10

## 2013-05-08 NOTE — H&P (Signed)
[name] is an 47 y.o. female.   Chief Complaint: Patient's here for colonoscopy. HPI: Patient is 47 year old Caucasian female who presents with 5 month history of intermittent hematochezia. She generally would have one episode a month of this month she's had 4 episodes and on one occasion she got some clots. She denies abdominal pain diarrhea or constipation. She has good appetite and has not lost any weight. She does take OTC NSAIDs once or twice a week for headache. Family history significant for colon carcinoma and maternal grandmother 52 and she died of anesthetic disease at 39.  Past Medical History  Diagnosis Date  . Headache(784.0)     tension/migraines  . GERD (gastroesophageal reflux disease)     TUMS as needed  . Asthma     triggered by weather changes; no inhaler  . 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   . PONV (postoperative nausea and vomiting)     also states stopped breathing after hernia surgery  . History of uterine cancer   . Stenosing tenosynovitis of finger 12/2012    left middle finger  . Ganglion cyst of wrist 12/2012    left dorsal cyst    Past Surgical History  Procedure Laterality Date  . Pubovaginal sling  2010  . Vagina reconstruction surgery      x 5 since 2010  . Abdominal hysterectomy  1994  . Ectopic pregnancy surgery  1995    left tube and ovary removed  . Appendectomy  1996  . Tubal ligation  1992  . Salpingoophorectomy  1996    right  . Ventral hernia repair  09/06/2006  . Cystoscopy with hydrodistension and biopsy  04/01/2007  . Trigger finger release  12/17/2012    Procedure: RELEASE TRIGGER FINGER/A-1 PULLEY;  Surgeon: Nicki Reaper, MD;  Location: [facility];  Service: Orthopedics;  Laterality: Left;  . Ganglion cyst excision  12/17/2012    Procedure: REMOVAL GANGLION OF WRIST;  Surgeon: Nicki Reaper, MD;  Location:  SURGERY CENTER;  Service: Orthopedics;  Laterality: Left;    History reviewed. No pertinent family  history. Social History:  reports that she has never smoked. She has never used smokeless tobacco. She reports that she does not drink alcohol or use illicit drugs.  Allergies:  Allergies  Allergen Reactions  . Imitrex (Sumatriptan)     Causes a migraine  . Cymbalta (Duloxetine Hcl) Rash  . Latex Rash  . Propoxyphene-Acetaminophen Nausea And Vomiting and Rash    Medications Prior to Admission  Medication Sig Dispense Refill  . estradiol (VIVELLE-DOT) 0.075 MG/24HR Place 1 patch onto the skin 2 (two) times a week.      . gabapentin (NEURONTIN) 300 MG capsule Take 900 mg by mouth at bedtime.      Marland Kitchen ibuprofen (ADVIL,MOTRIN) 200 MG tablet Take 400 mg by mouth every 6 (six) hours as needed for pain.       . sodium phosphates (OSMOPREP) 1.102-0.398 G TABS Take 1 tablet by mouth once.  32 tablet  0    No results found for this or any previous visit (from the past 48 hour(s)). No results found.  ROS  Blood pressure 127/79, pulse 61, temperature 98.1 F (36.7 C), temperature source Oral, resp. rate 16, height 5\' 4"  (1.626 m), weight 172 lb (78.019 kg), SpO2 95.00%. Physical Exam  Constitutional: She appears well-developed and well-nourished.  HENT:  Mouth/Throat: Oropharynx is clear and moist.  Eyes: Conjunctivae are normal. No scleral icterus.  Neck: No thyromegaly present.  Cardiovascular: Normal rate, regular rhythm and normal heart sounds.   No murmur heard. Respiratory: Effort normal and breath sounds normal.  GI: Soft. She exhibits no distension and no mass. There is no tenderness.  Musculoskeletal: She exhibits no edema.  Lymphadenopathy:    She has no cervical adenopathy.  Neurological: She is alert.  Skin: Skin is warm and dry.     Assessment/Plan Recurrent hematochezia. Diagnostic colonoscopy.  REHMAN,NAJEEB U 05/08/2013, 2:32 PM

## 2013-05-08 NOTE — Op Note (Signed)
COLONOSCOPY PROCEDURE REPORT  PATIENT:  [name]  MR#:  [id number] Birthdate:  01-Dec-1966, 47 y.o., female Endoscopist:  Dr. Malissa Hippo, MD Referred By:  Dr. Carylon Perches, MD Procedure Date: 05/08/2013  Procedure:   Colonoscopy  Indications:  Patient is 47 year old Caucasian female who presents with five-month history of intermittent hematochezia. She denies diarrhea or constipation. Family history significant for colon carcinoma and maternal grandmother at age 5.  Informed Consent:  The procedure and risks were reviewed with the patient and informed consent was obtained.  Medications:  Demerol 50 mg IV Versed 10 mg IV  Description of procedure:  After a digital rectal exam was performed, that colonoscope was advanced from the anus through the rectum and colon to the area of the cecum, ileocecal valve and appendiceal orifice. The cecum was deeply intubated. These structures were well-seen and photographed for the record. From the level of the cecum and ileocecal valve, the scope was slowly and cautiously withdrawn. The mucosal surfaces were carefully surveyed utilizing scope tip to flexion to facilitate fold flattening as needed. The scope was pulled down into the rectum where a thorough exam including retroflexion was performed.  Findings:   Prep satisfactory except she had stool coating the mucosa ascending colon and cecum. Cecal landmarks were well-seen. Noncompliant tortuous sigmoid colon resulting in difficult exam. Four small polyps were ablated via cold biopsy. These were submitted together. Two are located at cecum one hepatic flexure and one at sigmoid colon. Normal rectal mucosa. Small hemorrhoids below the dentate line. Suspect recurrent hematochezia secondary to hemorrhoids.   Therapeutic/Diagnostic Maneuvers Performed:  See above  Complications:  None  Cecal Withdrawal Time:  10 minutes  Impression:  Examination performed to cecum. Noncompliant sigmoid colon  resulting in difficult exam. Four small polyps ablated via cold biopsy and submitted together(two at cecum, one at hepatic flexure and fourth one at sigmoid colon). External hemorrhoids.   Recommendations:  Standard instructions given. Anusol-HC suppository 1 per rectum daily at bedtime for 2 weeks. Vicodin 5/300 by mouth 3 times a day when necessary for abdominal discomfort. Prescription given for 20 doses. Patient advised to keep symptom diary until office visit in 3 months. I will contact patient with biopsy results and further recommendations.  [name] U  05/08/2013 3:45 PM  CC: Dr. Carylon Perches, MD & Dr. Bonnetta Barry ref. provider found

## 2013-05-11 ENCOUNTER — Encounter (HOSPITAL_COMMUNITY): Payer: Self-pay | Admitting: Internal Medicine

## 2013-05-19 ENCOUNTER — Encounter (INDEPENDENT_AMBULATORY_CARE_PROVIDER_SITE_OTHER): Payer: Self-pay | Admitting: *Deleted

## 2013-07-14 ENCOUNTER — Telehealth (INDEPENDENT_AMBULATORY_CARE_PROVIDER_SITE_OTHER): Payer: Self-pay | Admitting: *Deleted

## 2013-07-14 NOTE — Telephone Encounter (Signed)
Noted, this is the recommendation's from procedure in May 2014 : Recommendations:  Standard instructions given.  Anusol-HC suppository 1 per rectum daily at bedtime for 2 weeks.  Vicodin 5/300 by mouth 3 times a day when necessary for abdominal discomfort. Prescription given for 20 doses.  Patient advised to keep symptom diary until office visit in 3 months.  I will contact patient with biopsy results and further recommendations   This will be discussed with Dr.Rehman.

## 2013-07-14 NOTE — Telephone Encounter (Signed)
[name] had a TCS in May 2014. She was told to call if she had anymore episodes of a bloody stool. Had one 07/01/13 then today, 07/14/13, was the worse. Her return phone number is [phone].

## 2013-07-15 NOTE — Telephone Encounter (Signed)
I have spoke with the patient and she states that on yesterday it was really bad. Her bowel movement was bloody and when she wiped it was blood and she noted a blood clot. She has not had a BM this morning. She is out of the Anusol Suppositories. She ask that Dr.Rehman be made aware that  She is just like she was before the Colonoscopy. Her work # [phone] and ask for her.

## 2013-07-15 NOTE — Telephone Encounter (Signed)
Per Dr.Rehman: Make sure the patient is on a High Fiber Diet , we may call in Anusol [facility] Suppositories 1 per rectum every night for 2 weeks. Patient should have a HGB the day before or day of appointment 08/11/13.Give 1 Hemoccult card, and patient should collect this prior to appointment and when she does not see blood.  Patient was called and made aware. She ask that we call Dr. Ouida Sills and request her recent lab work she just had done. She will come by and pick up her card on 07/16/13. Prescription was called to [facility].

## 2013-07-15 NOTE — Telephone Encounter (Signed)
The prescription information was given to Shawn/[facility]

## 2013-07-27 ENCOUNTER — Telehealth (INDEPENDENT_AMBULATORY_CARE_PROVIDER_SITE_OTHER): Payer: Self-pay | Admitting: *Deleted

## 2013-07-27 NOTE — Telephone Encounter (Signed)
Patient called and made aware.

## 2013-07-27 NOTE — Telephone Encounter (Signed)
Per Dr.Rehman the patient may try something else , such as Fiber Choice  Daily and take 2 stool softners a day.

## 2013-07-27 NOTE — Telephone Encounter (Signed)
Emanuella has an upcoming apt with Dr. Karilyn Cota on 08/11/13. She has had another episode on Friday, 07/24/13. When she went to the bathroom to have a BM, it came out like "plunk" with blood. Right before this is when the pit of her stomach started to hurt and lasted one hour afterwards. Edgar is scared because her grandmother died at a young age with colon cancer. On Sunday, 07/26/13, she had another bowel movement that was normal and NO blood. An average bowel movement for her is x2 weekly and can even go for weeks without one. She has tried the Fiber One but can not take it when she has to work. It giver her gas and she feels bloated. Can no longer eat Broccoli either. Nija's return phone number is [phone].

## 2013-07-27 NOTE — Telephone Encounter (Signed)
Message left on her answering machine 

## 2013-08-11 ENCOUNTER — Encounter (INDEPENDENT_AMBULATORY_CARE_PROVIDER_SITE_OTHER): Payer: Self-pay | Admitting: Internal Medicine

## 2013-08-11 ENCOUNTER — Ambulatory Visit (INDEPENDENT_AMBULATORY_CARE_PROVIDER_SITE_OTHER): Payer: BC Managed Care – PPO | Admitting: Internal Medicine

## 2013-08-11 VITALS — BP 110/70 | HR 78 | Temp 97.8°F | Resp 18

## 2013-08-11 DIAGNOSIS — K921 Melena: Secondary | ICD-10-CM | POA: Insufficient documentation

## 2013-08-11 DIAGNOSIS — K5900 Constipation, unspecified: Secondary | ICD-10-CM | POA: Insufficient documentation

## 2013-08-11 MED ORDER — LINACLOTIDE 145 MCG PO CAPS: 145.0000 ug | ORAL_CAPSULE | Freq: Every day | ORAL | Status: DC

## 2013-08-11 NOTE — Progress Notes (Signed)
Presenting complaint;  Followup for rectal bleeding.  Subjective:  Patient is 47 year old Caucasian female who underwent colonoscopy on 05/08/2013 for 1 year history of intermittent rectal bleeding. 4 small polyps were removed and these turned out to be tubular adenomas. She was also found to have external hemorrhoids. Patient was given Anusol suppository for 2 weeks. She was asked to keep symptom diary. She's had 4 episodes of hematochezia since her colonoscopy. On 2 occasions she passed small amount of blood with a bowel movement and on two other occasions she describes passing large amount of fresh blood per rectum. She had explosive bowel movement both times. She also complains of constipation which she's had since hysterectomy in 1994. She has tried stool softeners OTC laxatives but without good results. She has no more than 2-3 bowel movements per month. She also complains of pain and hypogastric region which starts before all moment and may last for an hour after a bowel movement. She has good appetite. Her weight has been stable. She tries to eat a high-fiber diet. She has experienced bloating with fiber supplements. She tries to walk at least 1 mile every day.  Current Medications: Current Outpatient Prescriptions  Medication Sig Dispense Refill  . estradiol (VIVELLE-DOT) 0.075 MG/24HR Place 1 patch onto the skin 2 (two) times a week.      . gabapentin (NEURONTIN) 300 MG capsule Take 900 mg by mouth at bedtime.      Marland Kitchen Hydrocodone-Acetaminophen (VICODIN) 5-300 MG TABS Take 1 tablet by mouth 3 (three) times daily as needed.  20 each  0  . ibuprofen (ADVIL,MOTRIN) 200 MG tablet Take 400 mg by mouth every 6 (six) hours as needed for pain.        No current facility-administered medications for this visit.     Objective: Blood pressure 110/70, pulse 78, temperature 97.8 F (36.6 C), temperature source Oral, resp. rate 18. Patient is alert and in no acute distress. Conjunctiva is pink.  Sclera is nonicteric Oropharyngeal mucosa is normal. No neck masses or thyromegaly noted. Abdomen. Bowel sounds are normal. Abdomen is soft and nontender without organomegaly or masses. No LE edema or clubbing noted.    Assessment:  #1. Recurrent hematochezia felt to be secondary to hemorrhoids in the setting of constipation and/or explosive bowel movements. Recent colonoscopy negative for colitis. #2. Chronic constipation. 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 #3. Recent colonoscopy revealed 4 small tubular adenomas.    Plan:  Continue high fiber diet. Probiotic one capsule daily(OTC). Linzess 145 mcg by mouth every morning. One month supply given along with prescription. Patient will keep symptom and artery for one month. Office visit in 4 months. Next colonoscopy would be in May, 2019.

## 2013-08-11 NOTE — Patient Instructions (Addendum)
High fiber diet. Keep symptom diary as to frequency, consistency of stools, rectal bleeding and abdominal pain for 4 weeks.

## 2013-09-15 ENCOUNTER — Encounter (INDEPENDENT_AMBULATORY_CARE_PROVIDER_SITE_OTHER): Payer: Self-pay | Admitting: Internal Medicine

## 2013-10-14 ENCOUNTER — Encounter (HOSPITAL_COMMUNITY): Payer: Self-pay | Admitting: Emergency Medicine

## 2013-10-14 ENCOUNTER — Emergency Department (HOSPITAL_COMMUNITY)
Admission: EM | Admit: 2013-10-14 | Discharge: 2013-10-14 | Disposition: A | Discharge status: Home / Self Care | Payer: BC Managed Care – PPO | Attending: Emergency Medicine | Admitting: Emergency Medicine

## 2013-10-14 ENCOUNTER — Emergency Department (HOSPITAL_COMMUNITY): Payer: BC Managed Care – PPO

## 2013-10-14 DIAGNOSIS — Z8739 Personal history of other diseases of the musculoskeletal system and connective tissue: Secondary | ICD-10-CM | POA: Insufficient documentation

## 2013-10-14 DIAGNOSIS — K219 Gastro-esophageal reflux disease without esophagitis: Secondary | ICD-10-CM | POA: Insufficient documentation

## 2013-10-14 DIAGNOSIS — R202 Paresthesia of skin: Secondary | ICD-10-CM

## 2013-10-14 DIAGNOSIS — Z8542 Personal history of malignant neoplasm of other parts of uterus: Secondary | ICD-10-CM | POA: Insufficient documentation

## 2013-10-14 DIAGNOSIS — Z91040 Latex allergy status: Secondary | ICD-10-CM | POA: Insufficient documentation

## 2013-10-14 DIAGNOSIS — J45909 Unspecified asthma, uncomplicated: Secondary | ICD-10-CM | POA: Insufficient documentation

## 2013-10-14 DIAGNOSIS — Z79899 Other long term (current) drug therapy: Secondary | ICD-10-CM | POA: Insufficient documentation

## 2013-10-14 DIAGNOSIS — R209 Unspecified disturbances of skin sensation: Secondary | ICD-10-CM | POA: Insufficient documentation

## 2013-10-14 LAB — DIFFERENTIAL
Basophils Absolute: 0 10*3/uL (ref 0.0–0.1)
Basophils Relative: 0 % (ref 0–1)
Eosinophils Absolute: 0.3 10*3/uL (ref 0.0–0.7)
Eosinophils Relative: 4 % (ref 0–5)
Lymphocytes Relative: 43 % (ref 12–46)
Lymphs Abs: 3.2 10*3/uL (ref 0.7–4.0)
Monocytes Absolute: 0.6 10*3/uL (ref 0.1–1.0)
Monocytes Relative: 8 % (ref 3–12)
Neutro Abs: 3.4 10*3/uL (ref 1.7–7.7)
Neutrophils Relative %: 45 % (ref 43–77)

## 2013-10-14 LAB — URINALYSIS, ROUTINE W REFLEX MICROSCOPIC
Bilirubin Urine: NEGATIVE
Glucose, UA: NEGATIVE mg/dL
Ketones, ur: NEGATIVE mg/dL
Leukocytes, UA: NEGATIVE
Nitrite: NEGATIVE
Protein, ur: NEGATIVE mg/dL
Specific Gravity, Urine: >1.03 — ABNORMAL HIGH (ref 1.005–1.030)
Urobilinogen, UA: 0.2 mg/dL (ref 0.0–1.0)
pH: 5.5 (ref 5.0–8.0)

## 2013-10-14 LAB — CBC
HCT: 42.5 % (ref 36.0–46.0)
Hemoglobin: 14.1 g/dL (ref 12.0–15.0)
MCH: 32.8 pg (ref 26.0–34.0)
MCHC: 33.2 g/dL (ref 30.0–36.0)
MCV: 98.8 fL (ref 78.0–100.0)
Platelets: 227 10*3/uL (ref 150–400)
RBC: 4.3 MIL/uL (ref 3.87–5.11)
RDW: 12.6 % (ref 11.5–15.5)
WBC: 7.5 10*3/uL (ref 4.0–10.5)

## 2013-10-14 LAB — RAPID URINE DRUG SCREEN, HOSP PERFORMED
Amphetamines: NOT DETECTED
Barbiturates: NOT DETECTED
Benzodiazepines: NOT DETECTED
Cocaine: NOT DETECTED
Opiates: NOT DETECTED
Tetrahydrocannabinol: NOT DETECTED

## 2013-10-14 LAB — COMPREHENSIVE METABOLIC PANEL
ALT: 21 U/L (ref 0–35)
AST: 24 U/L (ref 0–37)
Albumin: 4.2 g/dL (ref 3.5–5.2)
Alkaline Phosphatase: 84 U/L (ref 39–117)
BUN: 16 mg/dL (ref 6–23)
CO2: 28 mEq/L (ref 19–32)
Calcium: 9.8 mg/dL (ref 8.4–10.5)
Chloride: 102 mEq/L (ref 96–112)
Creatinine, Ser: 0.82 mg/dL (ref 0.50–1.10)
GFR calc Af Amer: >90 mL/min (ref >90)
GFR calc non Af Amer: 85 mL/min — ABNORMAL LOW (ref >90)
Glucose, Bld: 98 mg/dL (ref 70–99)
Potassium: 3.9 mEq/L (ref 3.5–5.1)
Sodium: 139 mEq/L (ref 135–145)
Total Bilirubin: 0.6 mg/dL (ref 0.3–1.2)
Total Protein: 7.8 g/dL (ref 6.0–8.3)

## 2013-10-14 LAB — PROTIME-INR
INR: 0.93 (ref 0.00–1.49)
Prothrombin Time: 12.3 seconds (ref 11.6–15.2)

## 2013-10-14 LAB — URINE MICROSCOPIC-ADD ON

## 2013-10-14 LAB — TROPONIN I: Troponin I: <0.3 ng/mL (ref <0.30)

## 2013-10-14 LAB — APTT: aPTT: 27 seconds (ref 24–37)

## 2013-10-14 LAB — ETHANOL: Alcohol, Ethyl (B): 18 mg/dL — ABNORMAL HIGH (ref 0–11)

## 2013-10-14 MED ORDER — ONDANSETRON HCL 4 MG/2ML IJ SOLN
4.0000 mg | Freq: Once | INTRAMUSCULAR | Status: AC
  Administered 2013-10-14: 4 mg via INTRAVENOUS
  Filled 2013-10-14: qty 2

## 2013-10-14 NOTE — ED Notes (Signed)
Pt c/o right side numbness, headache, and nausea. Pt states symptoms began this morning when she woke up at 6am. Pt states "at first I couldn't move my right arm or leg". Pt was ambulatory on arrival to ED. Pt is unsure if she had slurred speech at time of symptom onset. Pt denies chest pain, SOB. Pt states "I just feel funny on my entire right side. My face, arms, and legs". Pt has family hx of stroke.

## 2013-10-14 NOTE — ED Notes (Signed)
Pt reports waking at 6am today and "whole right side of body was numb", headache, and nausea. Able to move her extremities, but is walking slower. No difficulty w/ swallowing.

## 2013-10-14 NOTE — ED Provider Notes (Signed)
CSN: [id number]     Arrival date & time 10/14/13  4098 History  This chart was scribed for Charles B. Bernette Mayers, MD by Quintella Reichert, ED scribe.  This patient was seen in room APA06/[facility]06 and the patient's care was started at 8:42 AM.   Chief Complaint  Patient presents with  . Numbness    The history is provided by the patient. No language interpreter was used.    HPI Comments: [name] is a 47 y.o. female who presents to the Emergency Department complaining of constant, progressively-worsening right-sided extremity and facial numbness that began this morning.  Pt states she awoke up this morning at 6 AM with her right arm completely numb.  She then developed numbness to her right leg and the right side of her face.  Numbness is localized to the entire arm and leg all the way down, and to the right side of her face including her teeth.  She denies numbness to abdomen or chest.  Numbness has persisted since onset and has not been relieved by anything.  She states she is walking slow and "can't walk normal pace" but denies loss of coordination, difficulty with balance or recent falls.  She denies prior h/o similar symptoms.  She states she has been slightly fatigued recently but has otherwise felt well before her symptoms today.  She denies h/o HTN, DM, or high cholesterol.  She does not smoke.  She denies possibility of pregnancy (h/o hysterectomy).  PCP: Carylon Perches, MD   Past Medical History  Diagnosis Date  . Headache(784.0)     tension/migraines  . GERD (gastroesophageal reflux disease)     TUMS as needed  . Asthma     triggered by weather changes; no inhaler  . 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   . PONV (postoperative nausea and vomiting)     also states stopped breathing after hernia surgery  . History of uterine cancer   . Stenosing tenosynovitis of finger 12/2012    left middle finger  . Ganglion cyst of wrist 12/2012    left dorsal cyst    Past Surgical History   Procedure Laterality Date  . Pubovaginal sling  2010  . Vagina reconstruction surgery      x 5 since 2010  . Abdominal hysterectomy  1994  . Ectopic pregnancy surgery  1995    left tube and ovary removed  . Appendectomy  1996  . Tubal ligation  1992  . Salpingoophorectomy  1996    right  . Ventral hernia repair  09/06/2006  . Cystoscopy with hydrodistension and biopsy  04/01/2007  . Trigger finger release  12/17/2012    Procedure: RELEASE TRIGGER FINGER/A-1 PULLEY;  Surgeon: Nicki Reaper, MD;  Location: [facility];  Service: Orthopedics;  Laterality: Left;  . Ganglion cyst excision  12/17/2012    Procedure: REMOVAL GANGLION OF WRIST;  Surgeon: Nicki Reaper, MD;  Location: [facility];  Service: Orthopedics;  Laterality: Left;  . Colonoscopy N/A 05/08/2013    Procedure: COLONOSCOPY;  Surgeon: Malissa Hippo, MD;  [facility];  Service: Endoscopy;  Laterality: N/A;  240    No family history on file.   History  Substance Use Topics  . Smoking status: Never Smoker   . Smokeless tobacco: Never Used  . Alcohol Use: No    OB History   Grav Para Term Preterm Abortions TAB SAB Ect Mult Living  Review of Systems A complete 10 system review of systems was obtained and all systems are negative except as noted in the HPI and PMH.    Allergies  Imitrex; Cymbalta; Latex; and Propoxyphene-acetaminophen  Home Medications   Current Outpatient Rx  Name  Route  Sig  Dispense  Refill  . estradiol (VIVELLE-DOT) 0.075 MG/24HR   Transdermal   Place 1 patch onto the skin 2 (two) times a week.         . gabapentin (NEURONTIN) 300 MG capsule   Oral   Take 900 mg by mouth at bedtime.         Marland Kitchen Hydrocodone-Acetaminophen (VICODIN) 5-300 MG TABS   Oral   Take 1 tablet by mouth 3 (three) times daily as needed.   20 each   0   . ibuprofen (ADVIL,MOTRIN) 200 MG tablet   Oral   Take 400 mg by mouth every 6 (six) hours as needed for  pain.          Marland Kitchen Linaclotide (LINZESS) 145 MCG CAPS capsule   Oral   Take 1 capsule (145 mcg total) by mouth daily.   30 capsule   5    BP 133/87  Pulse 78  Temp(Src) 99.7 F (37.6 C) (Oral)  Resp 18  Ht 5\' 4"  (1.626 m)  Wt 165 lb (74.844 kg)  BMI 28.31 kg/m2  SpO2 97%  Physical Exam  Nursing note and vitals reviewed. Constitutional: She is oriented to person, place, and time. She appears well-developed and well-nourished.  HENT:  Head: Normocephalic and atraumatic.  Eyes: EOM are normal. Pupils are equal, round, and reactive to light.  Neck: Normal range of motion. Neck supple.  Cardiovascular: Normal rate, normal heart sounds and intact distal pulses.   Pulmonary/Chest: Effort normal and breath sounds normal.  Abdominal: Bowel sounds are normal. She exhibits no distension. There is no tenderness.  Musculoskeletal: Normal range of motion. She exhibits no edema and no tenderness.  Neurological: She is alert and oriented to person, place, and time. She has normal strength and normal reflexes. No cranial nerve deficit. Coordination normal.  Diminished sensation to entire right side of body including face, torso arm and leg  Skin: Skin is warm and dry. No rash noted.  Psychiatric: She has a normal mood and affect.    ED Course  Procedures (including critical care time)  DIAGNOSTIC STUDIES: Oxygen Saturation is 97% on room air, normal by my interpretation.    COORDINATION OF CARE: 8:47 AM-Discussed treatment plan which includes labs and imaging with pt at bedside and pt agreed to plan.    Labs Review Labs Reviewed  ETHANOL - Abnormal; Notable for the following:    Alcohol, Ethyl (B) 18 (*)    All other components within normal limits  COMPREHENSIVE METABOLIC PANEL - Abnormal; Notable for the following:    GFR calc non Af Amer 85 (*)    All other components within normal limits  URINALYSIS, ROUTINE W REFLEX MICROSCOPIC - Abnormal; Notable for the following:     Specific Gravity, Urine >1.030 (*)    Hgb urine dipstick TRACE (*)    All other components within normal limits  PROTIME-INR  APTT  CBC  DIFFERENTIAL  URINE RAPID DRUG SCREEN (HOSP PERFORMED)  TROPONIN I  URINE MICROSCOPIC-ADD ON     Imaging Review Ct Head Wo Contrast  10/14/2013   CLINICAL DATA:  Right-sided numbness and headache.  EXAM: CT HEAD WITHOUT CONTRAST  TECHNIQUE: Contiguous axial images were obtained  from the base of the skull through the vertex without contrast.  COMPARISON:  Brain MR 03/23/2010  FINDINGS: Normal appearance of the intracranial structures. No evidence for acute hemorrhage, mass lesion, midline shift, hydrocephalus or large infarct. No acute bony abnormality. The visualized sinuses are clear.  IMPRESSION: No acute intracranial abnormality.   Electronically Signed   By: Richarda Overlie M.D.   On: 10/14/2013 09:22   Mr Brain Wo Contrast  10/14/2013   CLINICAL DATA:  Right-sided numbness. Rule out stroke.  EXAM: MRI HEAD WITHOUT CONTRAST  TECHNIQUE: Multiplanar, multisequence MR imaging was performed. No intravenous contrast was administered.  COMPARISON:  CT 10/14/2013. MRI 03/23/2010  FINDINGS: Ventricle size is normal. Craniocervical junction normal. Pituitary is normal in size.  Negative for acute or chronic infarction. Negative for demyelinating disease. Cerebral white matter is normal. Brainstem and cerebellum are normal.  Negative for intracranial hemorrhage. Negative for infarct space at negative for mass or edema.  Paranasal sinuses are clear.  IMPRESSION: Normal examination.   Electronically Signed   By: Marlan Palau M.D.   On: 10/14/2013 11:33    EKG Interpretation     Ventricular Rate:  65 PR Interval:  164 QRS Duration: 88 QT Interval:  392 QTC Calculation: 407 R Axis:   65 Text Interpretation:  Normal sinus rhythm Nonspecific ST abnormality Abnormal ECG When compared with ECG of 23-Jun-2007 16:31, No significant change was found             MDM   1. Paresthesias     Labs and imaging reviewed and unremarkable. Paresthesias without focal neuro deficits. Neg CT and MRI. Doubt this is CVA or TIA. Advised Neurology followup for recheck.     I personally performed the services described in this documentation, which was scribed in my presence. The recorded information has been reviewed and is accurate.      Charles B. Bernette Mayers, MD 10/14/13 1256

## 2013-10-15 ENCOUNTER — Other Ambulatory Visit: Payer: Self-pay

## 2013-12-14 ENCOUNTER — Ambulatory Visit (INDEPENDENT_AMBULATORY_CARE_PROVIDER_SITE_OTHER): Payer: BC Managed Care – PPO | Admitting: Internal Medicine

## 2014-01-04 ENCOUNTER — Ambulatory Visit (INDEPENDENT_AMBULATORY_CARE_PROVIDER_SITE_OTHER): Payer: BC Managed Care – PPO | Admitting: Obstetrics & Gynecology

## 2014-01-04 ENCOUNTER — Other Ambulatory Visit (HOSPITAL_COMMUNITY)
Admission: RE | Admit: 2014-01-04 | Discharge: 2014-01-04 | Disposition: A | Discharge status: Home / Self Care | Payer: BC Managed Care – PPO | Source: Ambulatory Visit | Attending: Obstetrics & Gynecology | Admitting: Obstetrics & Gynecology

## 2014-01-04 ENCOUNTER — Encounter: Payer: Self-pay | Admitting: Obstetrics & Gynecology

## 2014-01-04 VITALS — BP 112/80 | Ht 63.2 in | Wt 173.0 lb

## 2014-01-04 DIAGNOSIS — Z01419 Encounter for gynecological examination (general) (routine) without abnormal findings: Secondary | ICD-10-CM | POA: Insufficient documentation

## 2014-01-04 DIAGNOSIS — Z1151 Encounter for screening for human papillomavirus (HPV): Secondary | ICD-10-CM | POA: Insufficient documentation

## 2014-01-04 NOTE — Addendum Note (Signed)
Addended by: Doyne Keel on: 01/04/2014 10:48 AM   Modules accepted: Orders

## 2014-01-04 NOTE — Progress Notes (Signed)
Patient ID: [name], female   DOB: Aug 26, 1966, 48 y.o.   MRN: [id number] Subjective:     [name] is a 48 y.o. female here for a routine exam.  No LMP recorded. Patient has had a hysterectomy. No obstetric history on file. Birth Control Method:  na Menstrual Calendar(currently): na  Current complaints: stress incontinence.   Current acute medical issues:  none   Recent Gynecologic History No LMP recorded. Patient has had a hysterectomy. Last Pap: na,  na Last mammogram: 2014,  normal  Past Medical History  Diagnosis Date  . Headache(784.0)     tension/migraines  . GERD (gastroesophageal reflux disease)     TUMS as needed  . Asthma     triggered by weather changes; no inhaler  . 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   . PONV (postoperative nausea and vomiting)     also states stopped breathing after hernia surgery  . History of uterine cancer   . Stenosing tenosynovitis of finger 12/2012    left middle finger  . Ganglion cyst of wrist 12/2012    left dorsal cyst    Past Surgical History  Procedure Laterality Date  . Pubovaginal sling  2010  . Vagina reconstruction surgery      x 5 since 2010  . Abdominal hysterectomy  1994  . Ectopic pregnancy surgery  1995    left tube and ovary removed  . Appendectomy  1996  . Tubal ligation  1992  . Salpingoophorectomy  1996    right  . Ventral hernia repair  09/06/2006  . Cystoscopy with hydrodistension and biopsy  04/01/2007  . Trigger finger release  12/17/2012    Procedure: RELEASE TRIGGER FINGER/A-1 PULLEY;  Surgeon: Wynonia Sours, MD;  Location: [place];  Service: Orthopedics;  Laterality: Left;  . Ganglion cyst excision  12/17/2012    Procedure: REMOVAL GANGLION OF WRIST;  Surgeon: Wynonia Sours, MD;  Location: [place];  Service: Orthopedics;  Laterality: Left;  . Colonoscopy N/A 05/08/2013    Procedure: COLONOSCOPY;  Surgeon: Rogene Houston, MD;  [facility];  Service:  Endoscopy;  Laterality: N/A;  240    OB History   Grav Para Term Preterm Abortions TAB SAB Ect Mult Living                  History   Social History  . Marital Status: Married    Spouse Name: N/A    Number of Children: N/A  . Years of Education: N/A   Social History Main Topics  . Smoking status: Never Smoker   . Smokeless tobacco: Never Used  . Alcohol Use: No  . Drug Use: No  . Sexual Activity: Yes    Birth Control/ Protection: Surgical   Other Topics Concern  . None   Social History Narrative  . None    Family History  Problem Relation Age of Onset  . Lung cancer Mother   . Bladder Cancer Mother   . Cervical cancer Mother   . Heart disease Father   . Cancer Maternal Grandmother      Review of Systems  Review of Systems  Constitutional: Negative for fever, chills, weight loss, malaise/fatigue and diaphoresis.  HENT: Negative for hearing loss, ear pain, nosebleeds, congestion, sore throat, neck pain, tinnitus and ear discharge.   Eyes: Negative for blurred vision, double vision, photophobia, pain, discharge and redness.  Respiratory: Negative for cough, hemoptysis, sputum production, shortness  of breath, wheezing and stridor.   Cardiovascular: Negative for chest pain, palpitations, orthopnea, claudication, leg swelling and PND.  Gastrointestinal: negative for abdominal pain. Negative for heartburn, nausea, vomiting, diarrhea, constipation, blood in stool and melena.  Genitourinary: Negative for dysuria, urgency, frequency, hematuria and flank pain.  Musculoskeletal: Negative for myalgias, back pain, joint pain and falls.  Skin: Negative for itching and rash.  Neurological: Negative for dizziness, tingling, tremors, sensory change, speech change, focal weakness, seizures, loss of consciousness, weakness and headaches.  Endo/Heme/Allergies: Negative for environmental allergies and polydipsia. Does not bruise/bleed easily.  Psychiatric/Behavioral: Negative for  depression, suicidal ideas, hallucinations, memory loss and substance abuse. The patient is not nervous/anxious and does not have insomnia.        Objective:    Physical Exam  Vitals reviewed. Constitutional: She is oriented to person, place, and time. She appears well-developed and well-nourished.  HENT:  Head: Normocephalic and atraumatic.        Right Ear: External ear normal.  Left Ear: External ear normal.  Nose: Nose normal.  Mouth/Throat: Oropharynx is clear and moist.  Eyes: Conjunctivae and EOM are normal. Pupils are equal, round, and reactive to light. Right eye exhibits no discharge. Left eye exhibits no discharge. No scleral icterus.  Neck: Normal range of motion. Neck supple. No tracheal deviation present. No thyromegaly present.  Cardiovascular: Normal rate, regular rhythm, normal heart sounds and intact distal pulses.  Exam reveals no gallop and no friction rub.   No murmur heard. Respiratory: Effort normal and breath sounds normal. No respiratory distress. She has no wheezes. She has no rales. She exhibits no tenderness.  GI: Soft. Bowel sounds are normal. She exhibits no distension and no mass. There is no tenderness. There is no rebound and no guarding.  Genitourinary:  Breasts no masses skin changes or nipple changes bilaterally      Vulva is normal without lesions Vagina is pink moist without discharge Cervix absent Uterus is absent Adnexa is negative with normal sized ovaries   Musculoskeletal: Normal range of motion. She exhibits no edema and no tenderness.  Neurological: She is alert and oriented to person, place, and time. She has normal reflexes. She displays normal reflexes. No cranial nerve deficit. She exhibits normal muscle tone. Coordination normal.  Skin: Skin is warm and dry. No rash noted. No erythema. No pallor.  Psychiatric: She has a normal mood and affect. Her behavior is normal. Judgment and thought content normal.       Assessment:     Healthy female exam.    Plan:    Mammogram ordered. Follow up in: 1 year.

## 2014-01-05 ENCOUNTER — Telehealth: Payer: Self-pay | Admitting: Obstetrics & Gynecology

## 2014-01-05 MED ORDER — ESTRADIOL 0.075 MG/24HR TD PTTW: 1.0000 | MEDICATED_PATCH | TRANSDERMAL | Status: DC

## 2014-01-05 NOTE — Telephone Encounter (Signed)
Left message letting pt know Vivelle-dot had been sent to pharmacy. [place]

## 2014-01-05 NOTE — Telephone Encounter (Signed)
Pt saw Dr. Elonda Husky yesterday and RX for vivelle-dot to be sent to pharmacy but pharmacy states did not get RX.

## 2014-01-13 ENCOUNTER — Emergency Department (HOSPITAL_COMMUNITY)
Admission: EM | Admit: 2014-01-13 | Discharge: 2014-01-13 | Disposition: A | Discharge status: Home / Self Care | Payer: BC Managed Care – PPO | Attending: Emergency Medicine | Admitting: Emergency Medicine

## 2014-01-13 ENCOUNTER — Emergency Department (HOSPITAL_COMMUNITY): Payer: BC Managed Care – PPO

## 2014-01-13 ENCOUNTER — Encounter (HOSPITAL_COMMUNITY): Payer: Self-pay | Admitting: Emergency Medicine

## 2014-01-13 DIAGNOSIS — Y9389 Activity, other specified: Secondary | ICD-10-CM | POA: Insufficient documentation

## 2014-01-13 DIAGNOSIS — Z8542 Personal history of malignant neoplasm of other parts of uterus: Secondary | ICD-10-CM | POA: Insufficient documentation

## 2014-01-13 DIAGNOSIS — Y9289 Other specified places as the place of occurrence of the external cause: Secondary | ICD-10-CM | POA: Insufficient documentation

## 2014-01-13 DIAGNOSIS — R51 Headache: Secondary | ICD-10-CM | POA: Insufficient documentation

## 2014-01-13 DIAGNOSIS — Z8719 Personal history of other diseases of the digestive system: Secondary | ICD-10-CM | POA: Insufficient documentation

## 2014-01-13 DIAGNOSIS — S96819A Strain of other specified muscles and tendons at ankle and foot level, unspecified foot, initial encounter: Principal | ICD-10-CM

## 2014-01-13 DIAGNOSIS — M65839 Other synovitis and tenosynovitis, unspecified forearm: Secondary | ICD-10-CM | POA: Insufficient documentation

## 2014-01-13 DIAGNOSIS — IMO0002 Reserved for concepts with insufficient information to code with codable children: Secondary | ICD-10-CM

## 2014-01-13 DIAGNOSIS — Z79899 Other long term (current) drug therapy: Secondary | ICD-10-CM | POA: Insufficient documentation

## 2014-01-13 DIAGNOSIS — X500XXA Overexertion from strenuous movement or load, initial encounter: Secondary | ICD-10-CM | POA: Insufficient documentation

## 2014-01-13 DIAGNOSIS — Z8742 Personal history of other diseases of the female genital tract: Secondary | ICD-10-CM | POA: Insufficient documentation

## 2014-01-13 DIAGNOSIS — M65849 Other synovitis and tenosynovitis, unspecified hand: Secondary | ICD-10-CM

## 2014-01-13 DIAGNOSIS — Z91040 Latex allergy status: Secondary | ICD-10-CM | POA: Insufficient documentation

## 2014-01-13 DIAGNOSIS — S93499A Sprain of other ligament of unspecified ankle, initial encounter: Secondary | ICD-10-CM | POA: Insufficient documentation

## 2014-01-13 DIAGNOSIS — J45901 Unspecified asthma with (acute) exacerbation: Secondary | ICD-10-CM | POA: Insufficient documentation

## 2014-01-13 MED ORDER — IBUPROFEN 800 MG PO TABS
800.0000 mg | ORAL_TABLET | Freq: Once | ORAL | Status: AC
  Administered 2014-01-13: 800 mg via ORAL
  Filled 2014-01-13: qty 1

## 2014-01-13 MED ORDER — OXYCODONE-ACETAMINOPHEN 5-325 MG PO TABS: 1.0000 | ORAL_TABLET | Freq: Four times a day (QID) | ORAL | Status: DC | PRN

## 2014-01-13 MED ORDER — ONDANSETRON HCL 4 MG PO TABS
4.0000 mg | ORAL_TABLET | Freq: Once | ORAL | Status: AC
  Administered 2014-01-13: 4 mg via ORAL
  Filled 2014-01-13: qty 1

## 2014-01-13 MED ORDER — OXYCODONE-ACETAMINOPHEN 5-325 MG PO TABS
1.0000 | ORAL_TABLET | Freq: Once | ORAL | Status: AC
  Administered 2014-01-13: 1 via ORAL
  Filled 2014-01-13: qty 1

## 2014-01-13 MED ORDER — DICLOFENAC SODIUM 75 MG PO TBEC: 75.0000 mg | DELAYED_RELEASE_TABLET | Freq: Two times a day (BID) | ORAL | Status: DC

## 2014-01-13 NOTE — Discharge Instructions (Signed)
Your x-ray is negative for fracture. There is question of ligament injury of the right ankle. Please keep the foot and ankle elevated above the waist. Please apply ice until you're seen by the orthopedic specialist. Please use crutches until seen by the orthopedic specialist. Please use diclofenac 2 times daily with food until all taken. May use Tylenol for mild pain, use Percocet for more severe pain. This medication may cause drowsiness, and/or constipation. Please use with caution. Please see Dr. Aline Brochure as sone as possible for orthopedic evaluation. Ligament Sprain Ligaments are tough, fibrous tissues that hold bones together at the joints. A sprain can occur when a ligament is stretched. This injury may take several weeks to heal. [place] the injured area for as long as directed by your caregiver. Then slowly start using the joint as directed by your caregiver and as the pain allows.  Keep the affected joint raised if possible to lessen swelling.  Apply ice for 15-20 minutes to the injured area every couple hours for the first half day, then 03-04 times per day for the first 48 hours. Put the ice in a plastic bag and place a towel between the bag of ice and your skin.  Wear any splinting, casting, or elastic bandage applications as instructed.  Only take over-the-counter or prescription medicines for pain, discomfort, or fever as directed by your caregiver. Do not use aspirin immediately after the injury unless instructed by your caregiver. Aspirin can cause increased bleeding and bruising of the tissues.  If you were given crutches, continue to use them as instructed and do not resume weight bearing on the affected extremity until instructed. SEEK MEDICAL CARE IF:   Your bruising, swelling, or pain increases.  You have cold and numb fingers or toes if your arm or leg was injured. SEEK IMMEDIATE MEDICAL CARE IF:   Your toes are numb or blue if your leg was  injured.  Your fingers are numb or blue if your arm was injured.  Your pain is not responding to medicines and continues to stay the same or gets worse. MAKE SURE YOU:   Understand these instructions.  Will watch your condition.  Will get help right away if you are not doing well or get worse. Document Released: 11/23/2000 Document Revised: 02/18/2012 Document Reviewed: 09/21/2008 [facility] Patient Information 2014 [place].

## 2014-01-13 NOTE — ED Provider Notes (Signed)
CSN: [id number]     Arrival date & time 01/13/14  1730 History   First MD Initiated Contact with Patient 01/13/14 1909     Chief Complaint  Patient presents with  . Ankle Pain   (Consider location/radiation/quality/duration/timing/severity/associated sxs/prior Treatment) HPI Comments: Patient is a 48 year old female who presents to the emergency department with a complaint of right ankle pain. The patient states that she stepped in a hole in her yard and heard 2 pops. After that she had swelling of the ankle and severe pain. She states she is unable to put any weight on the right ankle. It is of note that she has" weak ankles" she has had problems with strains and sprains in the past, but this one seems to be worse than usual. The patient denies being on any blood thinning type medications. She's not had any previous operations or procedures involving the ankle.  Patient is a 49 y.o. female presenting with ankle pain. The history is provided by the patient.  Ankle Pain Location:  Ankle Ankle location:  R ankle Associated symptoms: no back pain and no neck pain     Past Medical History  Diagnosis Date  . Headache(784.0)     tension/migraines  . GERD (gastroesophageal reflux disease)     TUMS as needed  . Asthma     triggered by weather changes; no inhaler  . 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   . PONV (postoperative nausea and vomiting)     also states stopped breathing after hernia surgery  . History of uterine cancer   . Stenosing tenosynovitis of finger 12/2012    left middle finger  . Ganglion cyst of wrist 12/2012    left dorsal cyst   Past Surgical History  Procedure Laterality Date  . Pubovaginal sling  2010  . Vagina reconstruction surgery      x 5 since 2010  . Abdominal hysterectomy  1994  . Ectopic pregnancy surgery  1995    left tube and ovary removed  . Appendectomy  1996  . Tubal ligation  1992  . Salpingoophorectomy  1996    right  . Ventral hernia repair   09/06/2006  . Cystoscopy with hydrodistension and biopsy  04/01/2007  . Trigger finger release  12/17/2012    Procedure: RELEASE TRIGGER FINGER/A-1 PULLEY;  Surgeon: Wynonia Sours, MD;  Location: [place];  Service: Orthopedics;  Laterality: Left;  . Ganglion cyst excision  12/17/2012    Procedure: REMOVAL GANGLION OF WRIST;  Surgeon: Wynonia Sours, MD;  Location: [place];  Service: Orthopedics;  Laterality: Left;  . Colonoscopy N/A 05/08/2013    Procedure: COLONOSCOPY;  Surgeon: Rogene Houston, MD;  [facility];  Service: Endoscopy;  Laterality: N/A;  240   Family History  Problem Relation Age of Onset  . Lung cancer Mother   . Bladder Cancer Mother   . Cervical cancer Mother   . Heart disease Father   . Cancer Maternal Grandmother    History  Substance Use Topics  . Smoking status: Never Smoker   . Smokeless tobacco: Never Used  . Alcohol Use: No   OB History   Grav Para Term Preterm Abortions TAB SAB Ect Mult Living                 Review of Systems  Constitutional: Negative for activity change.       All ROS Neg except as noted in HPI  HENT:  Negative for nosebleeds.   Eyes: Negative for photophobia and discharge.  Respiratory: Positive for wheezing. Negative for cough and shortness of breath.   Cardiovascular: Negative for chest pain and palpitations.  Gastrointestinal: Negative for abdominal pain and blood in stool.  Genitourinary: Negative for dysuria, frequency and hematuria.  Musculoskeletal: Positive for arthralgias. Negative for back pain and neck pain.  Skin: Negative.   Neurological: Positive for headaches. Negative for dizziness, seizures and speech difficulty.  Psychiatric/Behavioral: Negative for hallucinations and confusion.    Allergies  Imitrex; Cymbalta; Latex; and Propoxyphene n-acetaminophen  Home Medications   Current Outpatient Rx  Name  Route  Sig  Dispense  Refill  . estradiol (VIVELLE-DOT) 0.075  MG/24HR   Transdermal   Place 1 patch onto the skin 2 (two) times a week.   8 patch   11   . gabapentin (NEURONTIN) 300 MG capsule   Oral   Take 900 mg by mouth at bedtime.          BP 139/88  Pulse 82  Temp(Src) 98.4 F (36.9 C) (Oral)  Resp 18  SpO2 98% Physical Exam  Nursing note and vitals reviewed. Constitutional: She is oriented to person, place, and time. She appears well-developed and well-nourished.  Non-toxic appearance.  HENT:  Head: Normocephalic.  Right Ear: Tympanic membrane and external ear normal.  Left Ear: Tympanic membrane and external ear normal.  Eyes: EOM and lids are normal. Pupils are equal, round, and reactive to light.  Neck: Normal range of motion. Neck supple. Carotid bruit is not present.  Cardiovascular: Normal rate, regular rhythm, normal heart sounds, intact distal pulses and normal pulses.   Pulmonary/Chest: Breath sounds normal. No respiratory distress.  Abdominal: Soft. Bowel sounds are normal. There is no tenderness. There is no guarding.  Musculoskeletal: Normal range of motion.  There is full range of motion of the right hip and knee. There is no deformity of the tibia or fibula area. There is pain and swelling of the lateral malleolus. The examination of the Achilles tendon is limited but appears to be intact. The dorsalis pedis pulses 2+. There is good range of motion of the toes of the right lower extremity.  Lymphadenopathy:       Head (right side): No submandibular adenopathy present.       Head (left side): No submandibular adenopathy present.    She has no cervical adenopathy.  Neurological: She is alert and oriented to person, place, and time. She has normal strength. No cranial nerve deficit or sensory deficit.  Skin: Skin is warm and dry.  Psychiatric: She has a normal mood and affect. Her speech is normal.    ED Course  Procedures (including critical care time) Labs Review Labs Reviewed - No data to display Imaging  Review Dg Ankle Complete Right  01/13/2014   CLINICAL DATA:  Fall.  Lateral ankle pain and swelling.  EXAM: RIGHT ANKLE - COMPLETE 3+ VIEW  COMPARISON:  DG FOOT COMPLETE*R* dated 09/06/2006; DG ANKLE COMPLETE*R* dated 09/06/2006  FINDINGS: Abnormal soft tissue swelling overlies the lateral malleolus, without underlying malleolar fracture observed. However, there is abnormal widening of the tibiotalar joint laterally, implying a significant lateral ligamentous complex injury. Tibiotalar distance 6 mm laterally and 3 mm medially. Tibiotalar joint effusion.  Plantar and Achilles calcaneal spurs.  IMPRESSION: 1. Prominent soft tissue swelling over the lateral malleolus with abnormal widening of the tibiotalar joint laterally implying torn lateral ligamentous complex. 2. Tibiotalar joint effusion.   Electronically Signed  By: Sherryl Barters M.D.   On: 01/13/2014 18:42    EKG Interpretation   None       MDM  No diagnosis found. **I have reviewed nursing notes, vital signs, and all appropriate lab and imaging results for this patient.*  Patient stepped in a hole in her regard heard 2 pops in her ankle and began having pain and swelling of the right ankle. X-ray of the right ankle is negative for fracture, there is noted prominent soft tissue swelling over the lateral malleolus. There is widening of the tibiotalar joint laterally, suggesting ligamentous damage.  Plan at this time is for the patient to use ice and elevation. Patient is fitted with a posterior splint, postop shoe, and crutches. The patient will be treated with Percocet and diclofenac. Patient will see Dr. Aline Brochure in the office for office appointment.  Lenox Ahr, PA-C 01/13/14 [phone]

## 2014-01-13 NOTE — ED Provider Notes (Signed)
Medical screening examination/treatment/procedure(s) were performed by non-physician practitioner and as supervising physician I was immediately available for consultation/collaboration.  EKG Interpretation   None         Maudry Diego, MD 01/13/14 2228

## 2014-01-13 NOTE — ED Notes (Signed)
Pt reports stepped in hole in her yard and heard r ankle "pop."  Swelling noted. Pedal pulse present, pt can wiggle toes but causes pain.  Capillary refill WNL.

## 2014-01-13 NOTE — ED Notes (Signed)
Pt states she has crutches at home.

## 2014-01-14 ENCOUNTER — Telehealth: Payer: Self-pay | Admitting: Orthopedic Surgery

## 2014-01-14 NOTE — Telephone Encounter (Signed)
Call received from patient's husband Dellis Filbert, states patient was asleep, requests appointment following [facility] visit last evening, 01/13/14 for right ankle injury.  Offered first available appointment, which is Monday 01/18/14, due to Dr Aline Brochure being called to do emergency surgery today and schedule changes which have resulted.  Patient's husband elects to wait to schedule, states he would like to get her seen today and therefore said "will call [facility]" and if needs to call back for the Monday appointment, he will call back. PH# [phone].

## 2014-01-15 ENCOUNTER — Encounter: Payer: Self-pay | Admitting: Obstetrics & Gynecology

## 2014-01-15 ENCOUNTER — Encounter (INDEPENDENT_AMBULATORY_CARE_PROVIDER_SITE_OTHER): Payer: Self-pay | Admitting: Internal Medicine

## 2014-01-15 NOTE — Telephone Encounter (Signed)
No reply back to offer of appointment for Monday, 01/18/14, as patient and husband were planning to seek out appointment in [facility] for yesterday, 01/14/14.

## 2014-01-28 ENCOUNTER — Other Ambulatory Visit (HOSPITAL_COMMUNITY): Payer: Self-pay | Admitting: Orthopaedic Surgery

## 2014-01-28 DIAGNOSIS — T148XXA Other injury of unspecified body region, initial encounter: Secondary | ICD-10-CM

## 2014-02-02 ENCOUNTER — Ambulatory Visit (HOSPITAL_COMMUNITY)
Admission: RE | Admit: 2014-02-02 | Discharge: 2014-02-02 | Disposition: A | Discharge status: Home / Self Care | Payer: BC Managed Care – PPO | Source: Ambulatory Visit | Attending: Orthopaedic Surgery | Admitting: Orthopaedic Surgery

## 2014-02-02 DIAGNOSIS — M65979 Unspecified synovitis and tenosynovitis, unspecified ankle and foot: Secondary | ICD-10-CM | POA: Insufficient documentation

## 2014-02-02 DIAGNOSIS — M25473 Effusion, unspecified ankle: Secondary | ICD-10-CM | POA: Insufficient documentation

## 2014-02-02 DIAGNOSIS — S96819A Strain of other specified muscles and tendons at ankle and foot level, unspecified foot, initial encounter: Principal | ICD-10-CM

## 2014-02-02 DIAGNOSIS — M659 Synovitis and tenosynovitis, unspecified: Secondary | ICD-10-CM | POA: Insufficient documentation

## 2014-02-02 DIAGNOSIS — M25579 Pain in unspecified ankle and joints of unspecified foot: Secondary | ICD-10-CM | POA: Insufficient documentation

## 2014-02-02 DIAGNOSIS — X500XXA Overexertion from strenuous movement or load, initial encounter: Secondary | ICD-10-CM | POA: Insufficient documentation

## 2014-02-02 DIAGNOSIS — M25476 Effusion, unspecified foot: Secondary | ICD-10-CM | POA: Insufficient documentation

## 2014-02-02 DIAGNOSIS — M948X9 Other specified disorders of cartilage, unspecified sites: Secondary | ICD-10-CM | POA: Insufficient documentation

## 2014-02-02 DIAGNOSIS — S93499A Sprain of other ligament of unspecified ankle, initial encounter: Secondary | ICD-10-CM | POA: Insufficient documentation

## 2014-02-02 DIAGNOSIS — S8990XA Unspecified injury of unspecified lower leg, initial encounter: Secondary | ICD-10-CM | POA: Insufficient documentation

## 2014-02-02 DIAGNOSIS — S99919A Unspecified injury of unspecified ankle, initial encounter: Secondary | ICD-10-CM

## 2014-02-02 DIAGNOSIS — T148XXA Other injury of unspecified body region, initial encounter: Secondary | ICD-10-CM

## 2014-02-02 DIAGNOSIS — S99929A Unspecified injury of unspecified foot, initial encounter: Secondary | ICD-10-CM

## 2014-02-24 ENCOUNTER — Encounter (HOSPITAL_COMMUNITY): Payer: Self-pay | Admitting: Emergency Medicine

## 2014-02-24 ENCOUNTER — Emergency Department (HOSPITAL_COMMUNITY)
Admission: EM | Admit: 2014-02-24 | Discharge: 2014-02-24 | Disposition: A | Discharge status: Home / Self Care | Payer: BC Managed Care – PPO | Attending: Emergency Medicine | Admitting: Emergency Medicine

## 2014-02-24 ENCOUNTER — Emergency Department (HOSPITAL_COMMUNITY): Payer: BC Managed Care – PPO

## 2014-02-24 DIAGNOSIS — Z9889 Other specified postprocedural states: Secondary | ICD-10-CM | POA: Insufficient documentation

## 2014-02-24 DIAGNOSIS — Z8739 Personal history of other diseases of the musculoskeletal system and connective tissue: Secondary | ICD-10-CM | POA: Insufficient documentation

## 2014-02-24 DIAGNOSIS — Z91040 Latex allergy status: Secondary | ICD-10-CM | POA: Insufficient documentation

## 2014-02-24 DIAGNOSIS — R5383 Other fatigue: Secondary | ICD-10-CM

## 2014-02-24 DIAGNOSIS — Z8742 Personal history of other diseases of the female genital tract: Secondary | ICD-10-CM | POA: Insufficient documentation

## 2014-02-24 DIAGNOSIS — Z8542 Personal history of malignant neoplasm of other parts of uterus: Secondary | ICD-10-CM | POA: Insufficient documentation

## 2014-02-24 DIAGNOSIS — H538 Other visual disturbances: Secondary | ICD-10-CM

## 2014-02-24 DIAGNOSIS — J45909 Unspecified asthma, uncomplicated: Secondary | ICD-10-CM | POA: Insufficient documentation

## 2014-02-24 DIAGNOSIS — R5381 Other malaise: Secondary | ICD-10-CM | POA: Insufficient documentation

## 2014-02-24 DIAGNOSIS — R002 Palpitations: Secondary | ICD-10-CM | POA: Insufficient documentation

## 2014-02-24 DIAGNOSIS — K219 Gastro-esophageal reflux disease without esophagitis: Secondary | ICD-10-CM | POA: Insufficient documentation

## 2014-02-24 DIAGNOSIS — Z791 Long term (current) use of non-steroidal anti-inflammatories (NSAID): Secondary | ICD-10-CM | POA: Insufficient documentation

## 2014-02-24 LAB — COMPREHENSIVE METABOLIC PANEL
ALT: 30 U/L (ref 0–35)
AST: 44 U/L — ABNORMAL HIGH (ref 0–37)
Albumin: 3.4 g/dL — ABNORMAL LOW (ref 3.5–5.2)
Alkaline Phosphatase: 63 U/L (ref 39–117)
BUN: 15 mg/dL (ref 6–23)
CO2: 32 mEq/L (ref 19–32)
Calcium: 9.5 mg/dL (ref 8.4–10.5)
Chloride: 105 mEq/L (ref 96–112)
Creatinine, Ser: 1.09 mg/dL (ref 0.50–1.10)
GFR calc Af Amer: 70 mL/min — ABNORMAL LOW (ref >90)
GFR calc non Af Amer: 60 mL/min — ABNORMAL LOW (ref >90)
Glucose, Bld: 91 mg/dL (ref 70–99)
Potassium: 4.6 mEq/L (ref 3.7–5.3)
Sodium: 143 mEq/L (ref 137–147)
Total Bilirubin: 0.2 mg/dL — ABNORMAL LOW (ref 0.3–1.2)
Total Protein: 6.6 g/dL (ref 6.0–8.3)

## 2014-02-24 LAB — CBC WITH DIFFERENTIAL/PLATELET
Basophils Absolute: 0.1 10*3/uL (ref 0.0–0.1)
Basophils Relative: 1 % (ref 0–1)
Eosinophils Absolute: 0.4 10*3/uL (ref 0.0–0.7)
Eosinophils Relative: 5 % (ref 0–5)
HCT: 34 % — ABNORMAL LOW (ref 36.0–46.0)
Hemoglobin: 11.4 g/dL — ABNORMAL LOW (ref 12.0–15.0)
Lymphocytes Relative: 43 % (ref 12–46)
Lymphs Abs: 3.4 10*3/uL (ref 0.7–4.0)
MCH: 33.1 pg (ref 26.0–34.0)
MCHC: 33.5 g/dL (ref 30.0–36.0)
MCV: 98.8 fL (ref 78.0–100.0)
Monocytes Absolute: 0.5 10*3/uL (ref 0.1–1.0)
Monocytes Relative: 7 % (ref 3–12)
Neutro Abs: 3.5 10*3/uL (ref 1.7–7.7)
Neutrophils Relative %: 45 % (ref 43–77)
Platelets: 168 10*3/uL (ref 150–400)
RBC: 3.44 MIL/uL — ABNORMAL LOW (ref 3.87–5.11)
RDW: 12.9 % (ref 11.5–15.5)
WBC: 7.8 10*3/uL (ref 4.0–10.5)

## 2014-02-24 MED ORDER — HYDROCODONE-ACETAMINOPHEN 5-325 MG PO TABS: 1.0000 | ORAL_TABLET | Freq: Four times a day (QID) | ORAL | Status: DC | PRN

## 2014-02-24 MED ORDER — ONDANSETRON HCL 4 MG/2ML IJ SOLN
4.0000 mg | Freq: Once | INTRAMUSCULAR | Status: AC
  Administered 2014-02-24: 4 mg via INTRAVENOUS
  Filled 2014-02-24: qty 2

## 2014-02-24 MED ORDER — SODIUM CHLORIDE 0.9 % IV BOLUS (SEPSIS)
500.0000 mL | Freq: Once | INTRAVENOUS | Status: AC
  Administered 2014-02-24: 500 mL via INTRAVENOUS

## 2014-02-24 MED ORDER — HYDROCODONE-ACETAMINOPHEN 5-325 MG PO TABS: ORAL_TABLET | ORAL | Status: DC

## 2014-02-24 MED ORDER — MORPHINE SULFATE 4 MG/ML IJ SOLN
4.0000 mg | Freq: Once | INTRAMUSCULAR | Status: AC
  Administered 2014-02-24: 4 mg via INTRAVENOUS
  Filled 2014-02-24: qty 1

## 2014-02-24 NOTE — ED Provider Notes (Addendum)
CSN: [id number]     Arrival date & time 02/24/14  1054 History  This chart was scribed for Maudry Diego, MD by Ludger Nutting, ED Scribe. This patient was seen in room APA09/[facility]09 and the patient's care was started 11:14 AM.    Chief Complaint  Patient presents with  . Allergic Reaction      Patient is a 48 y.o. female presenting with eye problem. The history is provided by the patient. No language interpreter was used.  Eye Problem Location:  R eye Duration:  2 hours Timing:  Constant Progression:  Unchanged Chronicity:  New Relieved by:  Nothing Associated symptoms: no discharge and no headaches     HPI Comments: [name] is a 48 y.o. female who presents to the Emergency Department complaining of a constant, unchanged dilated right pupil that began upon waking this morning. Pt reports having right ankle surgery by Dr. Lorin Mercy 2 days ago in [place]. She has been taking pain medication, anti-emetics, and benadryl since the surgery. She states the pain medication causes her to itch but she was guided to take benadryl with each dose to minimize this. She reports feeling fatigued and having palpitations since the surgery. She also complains of muscle spasms to her right leg. Pt is concerned she is having an allergic reaction. She usually wears eyeglasses. She denies HA, dizziness.    Past Medical History  Diagnosis Date  . Headache(784.0)     tension/migraines  . GERD (gastroesophageal reflux disease)     TUMS as needed  . Asthma     triggered by weather changes; no inhaler  . 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   . PONV (postoperative nausea and vomiting)     also states stopped breathing after hernia surgery  . History of uterine cancer   . Stenosing tenosynovitis of finger 12/2012    left middle finger  . Ganglion cyst of wrist 12/2012    left dorsal cyst   Past Surgical History  Procedure Laterality Date  . Pubovaginal sling  2010  . Vagina reconstruction surgery       x 5 since 2010  . Abdominal hysterectomy  1994  . Ectopic pregnancy surgery  1995    left tube and ovary removed  . Appendectomy  1996  . Tubal ligation  1992  . Salpingoophorectomy  1996    right  . Ventral hernia repair  09/06/2006  . Cystoscopy with hydrodistension and biopsy  04/01/2007  . Trigger finger release  12/17/2012    Procedure: RELEASE TRIGGER FINGER/A-1 PULLEY;  Surgeon: Wynonia Sours, MD;  Location: [place];  Service: Orthopedics;  Laterality: Left;  . Ganglion cyst excision  12/17/2012    Procedure: REMOVAL GANGLION OF WRIST;  Surgeon: Wynonia Sours, MD;  Location: [place];  Service: Orthopedics;  Laterality: Left;  . Colonoscopy N/A 05/08/2013    Procedure: COLONOSCOPY;  Surgeon: Rogene Houston, MD;  [facility];  Service: Endoscopy;  Laterality: N/A;  240   Family History  Problem Relation Age of Onset  . Lung cancer Mother   . Bladder Cancer Mother   . Cervical cancer Mother   . Heart disease Father   . Cancer Maternal Grandmother    History  Substance Use Topics  . Smoking status: Never Smoker   . Smokeless tobacco: Never Used  . Alcohol Use: No   OB History   Grav Para Term Preterm Abortions TAB SAB Ect Mult Living  3 2  2 1   1        Review of Systems  Constitutional: Negative for appetite change and fatigue.  HENT: Negative for congestion, ear discharge and sinus pressure.   Eyes: Negative for discharge.       Right pupil dilation   Respiratory: Negative for cough.   Cardiovascular: Negative for chest pain.  Gastrointestinal: Negative for abdominal pain and diarrhea.  Genitourinary: Negative for frequency and hematuria.  Musculoskeletal: Negative for back pain.  Skin: Negative for rash.  Neurological: Negative for seizures and headaches.  Psychiatric/Behavioral: Negative for hallucinations.      Allergies  Imitrex; Cymbalta; Latex; and Propoxyphene n-acetaminophen  Home Medications   Current  Outpatient Rx  Name  Route  Sig  Dispense  Refill  . diclofenac (VOLTAREN) 75 MG EC tablet   Oral   Take 1 tablet (75 mg total) by mouth 2 (two) times daily.   12 tablet   0   . estradiol (VIVELLE-DOT) 0.075 MG/24HR   Transdermal   Place 1 patch onto the skin 2 (two) times a week.   8 patch   11   . gabapentin (NEURONTIN) 300 MG capsule   Oral   Take 900 mg by mouth at bedtime.         Marland Kitchen oxyCODONE-acetaminophen (PERCOCET/ROXICET) 5-325 MG per tablet   Oral   Take 1 tablet by mouth every 6 (six) hours as needed for severe pain.   20 tablet   0    BP 124/70  Pulse 70  Temp(Src) 98.3 F (36.8 C) (Oral)  Resp 16  Ht 5\' 4"  (1.626 m)  Wt 170 lb (77.111 kg)  BMI 29.17 kg/m2  SpO2 96% Physical Exam  Nursing note and vitals reviewed. Constitutional: She is oriented to person, place, and time. She appears well-developed.  HENT:  Head: Normocephalic.  Eyes: Conjunctivae and EOM are normal. No scleral icterus. Pupils are unequal.  Right pupil is large, dilated, and minimally reactive. Approximately 9 mm.  Vision nl.      Neck: Neck supple. No thyromegaly present.  Cardiovascular: Normal rate and regular rhythm.  Exam reveals no gallop and no friction rub.   No murmur heard. Pulmonary/Chest: No stridor. She has no wheezes. She has no rales. She exhibits no tenderness.  Abdominal: She exhibits no distension. There is no tenderness. There is no rebound.  Musculoskeletal: Normal range of motion. She exhibits no edema.  Cast on right ankle  Lymphadenopathy:    She has no cervical adenopathy.  Neurological: She is oriented to person, place, and time. She exhibits normal muscle tone. Coordination normal.  Skin: No rash noted. No erythema.  Psychiatric: She has a normal mood and affect. Her behavior is normal.    ED Course  Procedures (including critical care time)  DIAGNOSTIC STUDIES: Oxygen Saturation is 96% on RA, adequate by my interpretation.    COORDINATION OF  CARE: 11:24 AM Discussed treatment plan with pt at bedside and pt agreed to plan.   Labs Review Labs Reviewed  CBC WITH DIFFERENTIAL - Abnormal; Notable for the following:    RBC 3.44 (*)    Hemoglobin 11.4 (*)    HCT 34.0 (*)    All other components within normal limits  COMPREHENSIVE METABOLIC PANEL - Abnormal; Notable for the following:    Albumin 3.4 (*)    AST 44 (*)    Total Bilirubin 0.2 (*)    GFR calc non Af Amer 60 (*)    GFR  calc Af Amer 70 (*)    All other components within normal limits   Imaging Review Ct Head Wo Contrast  02/24/2014   CLINICAL DATA:  Patient awoke this morning with dilated right pupil, blurred vision, and itching, with history of right ankle surgery 2 days ago  EXAM: CT HEAD WITHOUT CONTRAST  TECHNIQUE: Contiguous axial images were obtained from the base of the skull through the vertex without intravenous contrast.  COMPARISON:  MR HEAD W/O CM dated 10/14/2013; CT HEAD W/O CM dated 10/14/2013  FINDINGS: No mass lesion. No midline shift. No acute hemorrhage or hematoma. No extra-axial fluid collections. No evidence of acute infarction.  IMPRESSION: Normal CT of the head   Electronically Signed   By: Skipper Cliche M.D.   On: 02/24/2014 11:36     EKG Interpretation None      MDM   Final diagnoses:  None  I spoke with Dr. Iona Hansen opthalmology.  He stated the dilation was from benadryl.  He will see the pt Monday if she has not improved   The chart was scribed for me under my direct supervision.  I personally performed the history, physical, and medical decision making and all procedures in the evaluation of this patient.Maudry Diego, MD 02/26/14 1126  Maudry Diego, MD 02/26/14 [phone]

## 2014-02-24 NOTE — ED Notes (Signed)
Pt awoke this am with dilated R pupil, blurred vision and itching. Pt had surgery on her R ankle Monday.

## 2014-02-24 NOTE — Discharge Instructions (Signed)
Stop the benadryl and follow up with Dr. Iona Hansen Monday if not better

## 2014-05-05 ENCOUNTER — Other Ambulatory Visit (HOSPITAL_COMMUNITY): Payer: Self-pay | Admitting: Internal Medicine

## 2014-05-05 DIAGNOSIS — Z1231 Encounter for screening mammogram for malignant neoplasm of breast: Secondary | ICD-10-CM

## 2014-05-17 ENCOUNTER — Ambulatory Visit (HOSPITAL_COMMUNITY)
Admission: RE | Admit: 2014-05-17 | Discharge: 2014-05-17 | Disposition: A | Discharge status: Home / Self Care | Payer: BC Managed Care – PPO | Source: Ambulatory Visit | Attending: Internal Medicine | Admitting: Internal Medicine

## 2014-05-17 DIAGNOSIS — Z1231 Encounter for screening mammogram for malignant neoplasm of breast: Secondary | ICD-10-CM | POA: Insufficient documentation

## 2014-08-05 ENCOUNTER — Encounter (HOSPITAL_COMMUNITY): Payer: Self-pay | Admitting: Pharmacy Technician

## 2014-08-05 ENCOUNTER — Other Ambulatory Visit (HOSPITAL_COMMUNITY): Payer: Self-pay | Admitting: Orthopaedic Surgery

## 2014-08-06 ENCOUNTER — Encounter (HOSPITAL_COMMUNITY): Payer: Self-pay

## 2014-08-06 ENCOUNTER — Encounter (HOSPITAL_COMMUNITY)
Admission: RE | Admit: 2014-08-06 | Discharge: 2014-08-06 | Disposition: A | Discharge status: Home / Self Care | Payer: BC Managed Care – PPO | Source: Ambulatory Visit | Attending: Orthopaedic Surgery | Admitting: Orthopaedic Surgery

## 2014-08-06 DIAGNOSIS — Z472 Encounter for removal of internal fixation device: Secondary | ICD-10-CM | POA: Diagnosis present

## 2014-08-06 DIAGNOSIS — X58XXXA Exposure to other specified factors, initial encounter: Secondary | ICD-10-CM | POA: Diagnosis not present

## 2014-08-06 DIAGNOSIS — M24173 Other articular cartilage disorders, unspecified ankle: Secondary | ICD-10-CM | POA: Diagnosis not present

## 2014-08-06 DIAGNOSIS — J45909 Unspecified asthma, uncomplicated: Secondary | ICD-10-CM | POA: Diagnosis not present

## 2014-08-06 DIAGNOSIS — Z90710 Acquired absence of both cervix and uterus: Secondary | ICD-10-CM | POA: Diagnosis not present

## 2014-08-06 DIAGNOSIS — Z91040 Latex allergy status: Secondary | ICD-10-CM | POA: Diagnosis not present

## 2014-08-06 DIAGNOSIS — M24876 Other specific joint derangements of unspecified foot, not elsewhere classified: Secondary | ICD-10-CM | POA: Diagnosis not present

## 2014-08-06 DIAGNOSIS — K219 Gastro-esophageal reflux disease without esophagitis: Secondary | ICD-10-CM | POA: Diagnosis not present

## 2014-08-06 DIAGNOSIS — Z9089 Acquired absence of other organs: Secondary | ICD-10-CM | POA: Diagnosis not present

## 2014-08-06 DIAGNOSIS — M24176 Other articular cartilage disorders, unspecified foot: Secondary | ICD-10-CM | POA: Diagnosis not present

## 2014-08-06 DIAGNOSIS — Z885 Allergy status to narcotic agent status: Secondary | ICD-10-CM | POA: Diagnosis not present

## 2014-08-06 DIAGNOSIS — M24873 Other specific joint derangements of unspecified ankle, not elsewhere classified: Secondary | ICD-10-CM | POA: Diagnosis not present

## 2014-08-06 DIAGNOSIS — M249 Joint derangement, unspecified: Secondary | ICD-10-CM | POA: Diagnosis not present

## 2014-08-06 DIAGNOSIS — G43909 Migraine, unspecified, not intractable, without status migrainosus: Secondary | ICD-10-CM | POA: Diagnosis not present

## 2014-08-06 DIAGNOSIS — Z888 Allergy status to other drugs, medicaments and biological substances status: Secondary | ICD-10-CM | POA: Diagnosis not present

## 2014-08-06 LAB — BASIC METABOLIC PANEL
Anion gap: 11 (ref 5–15)
BUN: 14 mg/dL (ref 6–23)
CO2: 25 mEq/L (ref 19–32)
Calcium: 9.6 mg/dL (ref 8.4–10.5)
Chloride: 103 mEq/L (ref 96–112)
Creatinine, Ser: 0.76 mg/dL (ref 0.50–1.10)
GFR calc Af Amer: >90 mL/min (ref >90)
GFR calc non Af Amer: >90 mL/min (ref >90)
Glucose, Bld: 85 mg/dL (ref 70–99)
Potassium: 4.4 mEq/L (ref 3.7–5.3)
Sodium: 139 mEq/L (ref 137–147)

## 2014-08-06 LAB — CBC
HCT: 37.7 % (ref 36.0–46.0)
Hemoglobin: 12.7 g/dL (ref 12.0–15.0)
MCH: 32.5 pg (ref 26.0–34.0)
MCHC: 33.7 g/dL (ref 30.0–36.0)
MCV: 96.4 fL (ref 78.0–100.0)
Platelets: 211 10*3/uL (ref 150–400)
RBC: 3.91 MIL/uL (ref 3.87–5.11)
RDW: 12.7 % (ref 11.5–15.5)
WBC: 7.3 10*3/uL (ref 4.0–10.5)

## 2014-08-06 NOTE — Pre-Procedure Instructions (Signed)
[name]  08/06/2014   Your procedure is scheduled on:  08-09-2014   Monday    Report to [facility] Admitting at 5:30 AM.   Call this number if you have problems the morning of surgery: [phone]   Remember:   Do not eat food or drink liquids after midnight.    Take these medicines the morning of surgery with A SIP OF WATER: Zofran as needed,terbinafine(Lamisil)   Do not wear jewelry, make-up or nail polish.  Do not wear lotions, powders, or perfumes. You may not wear deodorant.  Do not shave 48 hours prior to surgery. Men may shave face and neck.  Do not bring valuables to the hospital.  [facility] is not responsible for any belongings or valuables.               Contacts, dentures or bridgework may not be worn into surgery.   Leave suitcase in the car. After surgery it may be brought to your room.   For patients admitted to the hospital, discharge time is determined by your treatment team.               Patients discharged the day of surgery will not be allowed to drive home.    Special Instructions: See attached sheet for instructions on CHG shower/bath   Please read over the following fact sheets that you were given: Pain Booklet, Coughing and Deep Breathing and Surgical Site Infection Prevention

## 2014-08-06 NOTE — H&P (Signed)
[name] is an 48 y.o. female.   Chief Complaint: Followup right ankle ORIF syndesmosis with 2-hole plate and 2 screws and Brostrom repair for chronic lateral right ankle instability.  HPI:  She is back working at [employer].  She has been using her boot.  She sits occasionally but really does not have much time to sit as she is so busy working.  She has had a plate and ORIF syndesmotic screw for right  lateral ankle reconstruction. Pt had right ankle longstanding injury, and 2-hole Synthes plate and 2 bicortical screws were placed.  She has now been 3 months.  We will schedule her for placement of a tightrope anchor, tensioned, and then remove the plate and 2 screws with the lateral incision.  She is limited in dorsiflexion, just comes to neutral.  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  Pt wishes to proceed.  Past Medical History  Diagnosis Date  . Headache(784.0)     tension/migraines  . GERD (gastroesophageal reflux disease)     TUMS as needed  . Asthma     triggered by weather changes; no inhaler  . 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   . PONV (postoperative nausea and vomiting)     also states stopped breathing after hernia surgery  . History of uterine cancer   . Stenosing tenosynovitis of finger 12/2012    left middle finger  . Ganglion cyst of wrist 12/2012    left dorsal cyst    Past Surgical History  Procedure Laterality Date  . Pubovaginal sling  2010  . Vagina reconstruction surgery      x 5 since 2010  . Abdominal hysterectomy  1994  . Ectopic pregnancy surgery  1995    left tube and ovary removed  . Appendectomy  1996  . Tubal ligation  1992  . Salpingoophorectomy  1996    right  . Ventral hernia repair  09/06/2006  . Cystoscopy with hydrodistension and biopsy  04/01/2007  . Trigger  finger release  12/17/2012    Procedure: RELEASE TRIGGER FINGER/A-1 PULLEY;  Surgeon: Wynonia Sours, MD;  Location: [place];  Service: Orthopedics;  Laterality: Left;  . Ganglion cyst excision  12/17/2012    Procedure: REMOVAL GANGLION OF WRIST;  Surgeon: Wynonia Sours, MD;  Location: [place];  Service: Orthopedics;  Laterality: Left;  . Colonoscopy N/A 05/08/2013    Procedure: COLONOSCOPY;  Surgeon: Rogene Houston, MD;  [facility];  Service: Endoscopy;  Laterality: N/A;  240    Family History  Problem Relation Age of Onset  . Lung cancer Mother   . Bladder Cancer Mother   . Cervical cancer Mother   . Heart disease Father   . Cancer Maternal Grandmother    Social History:  reports that she has never smoked. She has never used smokeless tobacco. She reports that she does not drink alcohol or use illicit drugs.  Allergies:  Allergies  Allergen Reactions  . Imitrex [Sumatriptan]     Causes a migraine  . Percocet [Oxycodone-Acetaminophen] Other (See Comments)    Eyes dilate  . Cymbalta [Duloxetine Hcl] Rash  . Latex Rash  . Propoxyphene N-Acetaminophen Nausea And Vomiting and Rash  . Tramadol Rash    No prescriptions prior to admission  No results found for this or any previous visit (from the past 48 hour(s)). No results found.  Review of Systems  Musculoskeletal:       Right ankle pain and swellinig    There were no vitals taken for this visit. Physical Exam  Constitutional: She is oriented to person, place, and time. She appears well-developed and well-nourished.  HENT:  Head: Normocephalic and atraumatic.  Eyes: Pupils are equal, round, and reactive to light.  Neck: Normal range of motion.  Cardiovascular: Normal rate and intact distal pulses.   Respiratory: Effort normal.  GI: Soft.  Musculoskeletal:  Well healed surgical wounds of right ankle.  Limited ROM of right ankle with dorsiflexion  Neurological: She is alert  and oriented to person, place, and time.  Skin: Skin is warm.  Psychiatric: She has a normal mood and affect.     Assessment/Plan Status post right ankle syndesmosis ORIF  PLAN:  Removal of right ankle plates and screws with placement of tight rope for right ankle instability  Evanell Redlich M 08/06/2014, 10:57 AM

## 2014-08-08 MED ORDER — CEFAZOLIN SODIUM-DEXTROSE 2-3 GM-% IV SOLR
2.0000 g | INTRAVENOUS | Status: AC
  Administered 2014-08-09: 2 g via INTRAVENOUS
  Filled 2014-08-08: qty 50

## 2014-08-08 NOTE — Anesthesia Preprocedure Evaluation (Addendum)
Anesthesia Evaluation   Patient awake    Reviewed: Allergy & Precautions, H&P , NPO status , Patient's Chart, lab work & pertinent test results  History of Anesthesia Complications (+) PONV  Airway Mallampati: II TM Distance: >3 FB     Dental  (+) Teeth Intact, Dental Advisory Given   Pulmonary asthma ,  breath sounds clear to auscultation        Cardiovascular Rhythm:Regular Rate:Abnormal     Neuro/Psych    GI/Hepatic GERD-  Medicated,  Endo/Other    Renal/GU      Musculoskeletal  (+) Fibromyalgia -  Abdominal   Peds  Hematology   Anesthesia Other Findings   Reproductive/Obstetrics                          Anesthesia Physical Anesthesia Plan  ASA: II  Anesthesia Plan: Regional   Post-op Pain Management:    Induction: Intravenous  Airway Management Planned: LMA and Oral ETT  Additional Equipment:   Intra-op Plan:   Post-operative Plan: Extubation in OR  Informed Consent: I have reviewed the patients History and Physical, chart, labs and discussed the procedure including the risks, benefits and alternatives for the proposed anesthesia with the patient or authorized representative who has indicated his/her understanding and acceptance.     Plan Discussed with:   Anesthesia Plan Comments:         Anesthesia Quick Evaluation

## 2014-08-09 ENCOUNTER — Ambulatory Visit (HOSPITAL_COMMUNITY): Payer: BC Managed Care – PPO

## 2014-08-09 ENCOUNTER — Ambulatory Visit (HOSPITAL_COMMUNITY): Payer: BC Managed Care – PPO | Admitting: Anesthesiology

## 2014-08-09 ENCOUNTER — Encounter (HOSPITAL_COMMUNITY): Payer: Self-pay | Admitting: Certified Registered Nurse Anesthetist

## 2014-08-09 ENCOUNTER — Encounter (HOSPITAL_COMMUNITY): Payer: BC Managed Care – PPO | Admitting: Anesthesiology

## 2014-08-09 ENCOUNTER — Ambulatory Visit (HOSPITAL_COMMUNITY)
Admission: RE | Admit: 2014-08-09 | Discharge: 2014-08-09 | Disposition: A | Discharge status: Home / Self Care | Payer: BC Managed Care – PPO | Source: Ambulatory Visit | Attending: Orthopaedic Surgery | Admitting: Orthopaedic Surgery

## 2014-08-09 ENCOUNTER — Encounter (HOSPITAL_COMMUNITY)
Admission: RE | Disposition: A | Discharge status: Home / Self Care | Payer: Self-pay | Source: Ambulatory Visit | Attending: Orthopaedic Surgery

## 2014-08-09 DIAGNOSIS — Z91040 Latex allergy status: Secondary | ICD-10-CM | POA: Insufficient documentation

## 2014-08-09 DIAGNOSIS — M24876 Other specific joint derangements of unspecified foot, not elsewhere classified: Secondary | ICD-10-CM

## 2014-08-09 DIAGNOSIS — G43909 Migraine, unspecified, not intractable, without status migrainosus: Secondary | ICD-10-CM | POA: Insufficient documentation

## 2014-08-09 DIAGNOSIS — Z472 Encounter for removal of internal fixation device: Secondary | ICD-10-CM | POA: Diagnosis not present

## 2014-08-09 DIAGNOSIS — Z885 Allergy status to narcotic agent status: Secondary | ICD-10-CM | POA: Insufficient documentation

## 2014-08-09 DIAGNOSIS — M25371 Other instability, right ankle: Secondary | ICD-10-CM

## 2014-08-09 DIAGNOSIS — X58XXXA Exposure to other specified factors, initial encounter: Secondary | ICD-10-CM | POA: Insufficient documentation

## 2014-08-09 DIAGNOSIS — M24173 Other articular cartilage disorders, unspecified ankle: Secondary | ICD-10-CM | POA: Insufficient documentation

## 2014-08-09 DIAGNOSIS — Z9089 Acquired absence of other organs: Secondary | ICD-10-CM | POA: Insufficient documentation

## 2014-08-09 DIAGNOSIS — M24873 Other specific joint derangements of unspecified ankle, not elsewhere classified: Secondary | ICD-10-CM | POA: Insufficient documentation

## 2014-08-09 DIAGNOSIS — M24176 Other articular cartilage disorders, unspecified foot: Secondary | ICD-10-CM | POA: Insufficient documentation

## 2014-08-09 DIAGNOSIS — M249 Joint derangement, unspecified: Secondary | ICD-10-CM | POA: Insufficient documentation

## 2014-08-09 DIAGNOSIS — Z888 Allergy status to other drugs, medicaments and biological substances status: Secondary | ICD-10-CM | POA: Insufficient documentation

## 2014-08-09 DIAGNOSIS — K219 Gastro-esophageal reflux disease without esophagitis: Secondary | ICD-10-CM | POA: Insufficient documentation

## 2014-08-09 DIAGNOSIS — Z90710 Acquired absence of both cervix and uterus: Secondary | ICD-10-CM | POA: Insufficient documentation

## 2014-08-09 DIAGNOSIS — J45909 Unspecified asthma, uncomplicated: Secondary | ICD-10-CM | POA: Insufficient documentation

## 2014-08-09 HISTORY — PX: HARDWARE REMOVAL: SHX979

## 2014-08-09 SURGERY — REMOVAL, HARDWARE
Anesthesia: Regional | Site: Ankle | Laterality: Right

## 2014-08-09 MED ORDER — LACTATED RINGERS IV SOLN
INTRAVENOUS | Status: DC | PRN
  Administered 2014-08-09: 07:00:00 via INTRAVENOUS

## 2014-08-09 MED ORDER — ACETAMINOPHEN 10 MG/ML IV SOLN
INTRAVENOUS | Status: AC
  Filled 2014-08-09: qty 100

## 2014-08-09 MED ORDER — MEPERIDINE HCL 25 MG/ML IJ SOLN: 6.2500 mg | INTRAMUSCULAR | Status: DC | PRN

## 2014-08-09 MED ORDER — FENTANYL CITRATE 0.05 MG/ML IJ SOLN
INTRAMUSCULAR | Status: AC
  Filled 2014-08-09: qty 2

## 2014-08-09 MED ORDER — DIPHENHYDRAMINE HCL 50 MG/ML IJ SOLN
INTRAMUSCULAR | Status: DC | PRN
  Administered 2014-08-09: 12.5 mg via INTRAVENOUS

## 2014-08-09 MED ORDER — FENTANYL CITRATE 0.05 MG/ML IJ SOLN
INTRAMUSCULAR | Status: DC | PRN
  Administered 2014-08-09 (×2): 100 ug via INTRAVENOUS

## 2014-08-09 MED ORDER — DEXAMETHASONE SODIUM PHOSPHATE 10 MG/ML IJ SOLN
INTRAMUSCULAR | Status: DC | PRN
  Administered 2014-08-09: 10 mg via INTRAVENOUS

## 2014-08-09 MED ORDER — ONDANSETRON HCL 4 MG/2ML IJ SOLN
INTRAMUSCULAR | Status: DC | PRN
  Administered 2014-08-09: 4 mg via INTRAVENOUS

## 2014-08-09 MED ORDER — FENTANYL CITRATE 0.05 MG/ML IJ SOLN
INTRAMUSCULAR | Status: AC
  Filled 2014-08-09: qty 5

## 2014-08-09 MED ORDER — LIDOCAINE HCL (CARDIAC) 20 MG/ML IV SOLN
INTRAVENOUS | Status: DC | PRN
  Administered 2014-08-09: 100 mg via INTRAVENOUS

## 2014-08-09 MED ORDER — PROPOFOL 10 MG/ML IV BOLUS
INTRAVENOUS | Status: DC | PRN
  Administered 2014-08-09: 180 mg via INTRAVENOUS

## 2014-08-09 MED ORDER — MIDAZOLAM HCL 2 MG/2ML IJ SOLN
INTRAMUSCULAR | Status: AC
  Filled 2014-08-09: qty 2

## 2014-08-09 MED ORDER — PROMETHAZINE HCL 25 MG/ML IJ SOLN: 6.2500 mg | INTRAMUSCULAR | Status: DC | PRN

## 2014-08-09 MED ORDER — 0.9 % SODIUM CHLORIDE (POUR BTL) OPTIME
TOPICAL | Status: DC | PRN
  Administered 2014-08-09: 1000 mL

## 2014-08-09 MED ORDER — FENTANYL CITRATE 0.05 MG/ML IJ SOLN
25.0000 ug | INTRAMUSCULAR | Status: DC | PRN
  Administered 2014-08-09 (×3): 50 ug via INTRAVENOUS

## 2014-08-09 MED ORDER — HYDROCODONE-ACETAMINOPHEN 5-325 MG PO TABS: 2.0000 | ORAL_TABLET | Freq: Four times a day (QID) | ORAL | Status: DC | PRN

## 2014-08-09 MED ORDER — ACETAMINOPHEN 10 MG/ML IV SOLN
1000.0000 mg | Freq: Once | INTRAVENOUS | Status: AC
  Administered 2014-08-09: 1000 mg via INTRAVENOUS

## 2014-08-09 MED ORDER — SCOPOLAMINE 1 MG/3DAYS TD PT72
1.0000 | MEDICATED_PATCH | TRANSDERMAL | Status: DC
  Administered 2014-08-09: 1.5 mg via TRANSDERMAL

## 2014-08-09 MED ORDER — PROPOFOL 10 MG/ML IV BOLUS
INTRAVENOUS | Status: AC
  Filled 2014-08-09: qty 20

## 2014-08-09 MED ORDER — SCOPOLAMINE 1 MG/3DAYS TD PT72
MEDICATED_PATCH | TRANSDERMAL | Status: AC
  Administered 2014-08-09: 1.5 mg via TRANSDERMAL
  Filled 2014-08-09: qty 1

## 2014-08-09 MED ORDER — MIDAZOLAM HCL 5 MG/5ML IJ SOLN
INTRAMUSCULAR | Status: DC | PRN
  Administered 2014-08-09: 2 mg via INTRAVENOUS

## 2014-08-09 MED ORDER — ONDANSETRON HCL 4 MG PO TABS: 4.0000 mg | ORAL_TABLET | Freq: Three times a day (TID) | ORAL | Status: DC | PRN

## 2014-08-09 SURGICAL SUPPLY — 57 items
BANDAGE ELASTIC 4 VELCRO ST LF (GAUZE/BANDAGES/DRESSINGS) ×1 IMPLANT
BANDAGE ELASTIC 6 VELCRO ST LF (GAUZE/BANDAGES/DRESSINGS) IMPLANT
BANDAGE ESMARK 6X9 LF (GAUZE/BANDAGES/DRESSINGS) IMPLANT
BNDG CMPR 9X6 STRL LF SNTH (GAUZE/BANDAGES/DRESSINGS) ×1
BNDG COHESIVE 4X5 TAN STRL (GAUZE/BANDAGES/DRESSINGS) IMPLANT
BNDG ESMARK 6X9 LF (GAUZE/BANDAGES/DRESSINGS) ×2
BNDG GAUZE ELAST 4 BULKY (GAUZE/BANDAGES/DRESSINGS) ×1 IMPLANT
CATH URET WHISTLE 8FR 331008 (CATHETERS) ×1 IMPLANT
COVER SURGICAL LIGHT HANDLE (MISCELLANEOUS) ×2 IMPLANT
CUFF TOURNIQUET SINGLE 18IN (TOURNIQUET CUFF) ×1 IMPLANT
DRAPE C-ARM 42X72 X-RAY (DRAPES) ×1 IMPLANT
DRAPE EXTREMITY T 121X128X90 (DRAPE) ×1 IMPLANT
DRAPE INCISE IOBAN 66X45 STRL (DRAPES) ×1 IMPLANT
DRAPE ORTHO SPLIT 77X108 STRL (DRAPES)
DRAPE PROXIMA HALF (DRAPES) ×1 IMPLANT
DRAPE SURG ORHT 6 SPLT 77X108 (DRAPES) IMPLANT
DRAPE U-SHAPE 47X51 STRL (DRAPES) ×1 IMPLANT
DRSG EMULSION OIL 3X3 NADH (GAUZE/BANDAGES/DRESSINGS) ×1 IMPLANT
DRSG PAD ABDOMINAL 8X10 ST (GAUZE/BANDAGES/DRESSINGS) ×1 IMPLANT
ELECT REM PT RETURN 9FT ADLT (ELECTROSURGICAL) ×2
ELECTRODE REM PT RTRN 9FT ADLT (ELECTROSURGICAL) ×1 IMPLANT
GAUZE SPONGE 4X4 12PLY STRL (GAUZE/BANDAGES/DRESSINGS) ×2 IMPLANT
GAUZE XEROFORM 1X8 LF (GAUZE/BANDAGES/DRESSINGS) ×1 IMPLANT
GLOVE BIOGEL PI IND STRL 6.5 (GLOVE) IMPLANT
GLOVE BIOGEL PI IND STRL 7.5 (GLOVE) ×1 IMPLANT
GLOVE BIOGEL PI IND STRL 8 (GLOVE) ×1 IMPLANT
GLOVE BIOGEL PI INDICATOR 6.5 (GLOVE) ×1
GLOVE BIOGEL PI INDICATOR 7.5 (GLOVE) ×1
GLOVE BIOGEL PI INDICATOR 8 (GLOVE) ×1
GLOVE BIOGEL PI ORTHO PRO SZ7 (GLOVE) ×2
GLOVE ECLIPSE 7.0 STRL STRAW (GLOVE) ×1 IMPLANT
GLOVE ORTHO TXT STRL SZ7.5 (GLOVE) ×1 IMPLANT
GLOVE PI ORTHO PRO STRL SZ7 (GLOVE) IMPLANT
GLOVE SURG SS PI 6.5 STRL IVOR (GLOVE) ×1 IMPLANT
GLOVE SURG SS PI 7.0 STRL IVOR (GLOVE) ×3 IMPLANT
GLOVE SURG SS PI 7.5 STRL IVOR (GLOVE) ×2 IMPLANT
GOWN STRL REUS W/ TWL LRG LVL3 (GOWN DISPOSABLE) ×3 IMPLANT
GOWN STRL REUS W/TWL LRG LVL3 (GOWN DISPOSABLE) ×6
KIT BASIN OR (CUSTOM PROCEDURE TRAY) ×2 IMPLANT
KIT ROOM TURNOVER OR (KITS) ×2 IMPLANT
MANIFOLD NEPTUNE II (INSTRUMENTS) ×2 IMPLANT
NS IRRIG 1000ML POUR BTL (IV SOLUTION) ×2 IMPLANT
PACK GENERAL/GYN (CUSTOM PROCEDURE TRAY) ×2 IMPLANT
PAD ARMBOARD 7.5X6 YLW CONV (MISCELLANEOUS) ×3 IMPLANT
PAD CAST 4YDX4 CTTN HI CHSV (CAST SUPPLIES) ×1 IMPLANT
PADDING CAST COTTON 4X4 STRL (CAST SUPPLIES) ×2
STAPLER VISISTAT 35W (STAPLE) ×2 IMPLANT
STOCKINETTE IMPERVIOUS 9X36 MD (GAUZE/BANDAGES/DRESSINGS) ×1 IMPLANT
SUT ETHILON 4 0 FS 1 (SUTURE) ×1 IMPLANT
SUT VIC AB 0 CT1 27 (SUTURE)
SUT VIC AB 0 CT1 27XBRD ANBCTR (SUTURE) IMPLANT
SUT VIC AB 2-0 CT1 27 (SUTURE) ×2
SUT VIC AB 2-0 CT1 TAPERPNT 27 (SUTURE) IMPLANT
TIGHTROPE SYNDESMOSIS (Orthopedic Implant) ×1 IMPLANT
TOWEL OR 17X24 6PK STRL BLUE (TOWEL DISPOSABLE) ×2 IMPLANT
TOWEL OR 17X26 10 PK STRL BLUE (TOWEL DISPOSABLE) ×2 IMPLANT
WATER STERILE IRR 1000ML POUR (IV SOLUTION) ×1 IMPLANT

## 2014-08-09 NOTE — Progress Notes (Signed)
Orthopedic Tech Progress Note Patient Details:  [name] [birth year] [id number]  Ortho Devices Type of Ortho Device: CAM walker Ortho Device/Splint Interventions: Application   Cammer, Theodoro Parma 08/09/2014, 1:28 PM

## 2014-08-09 NOTE — Interval H&P Note (Signed)
History and Physical Interval Note:  08/09/2014 7:20 AM  [name]  has presented today for surgery, with the diagnosis of Status Post Open Reduction Internal Fixation Right Syndesmosis  The various methods of treatment have been discussed with the patient and family. After consideration of risks, benefits and other options for treatment, the patient has consented to  Procedure(s): Placement Right Ankle Tight Rope, Removal Plate and Screws (Right) as a surgical intervention .  The patient's history has been reviewed, patient examined, no change in status, stable for surgery.  I have reviewed the patient's chart and labs.  Questions were answered to the patient's satisfaction.     Tarae Wooden C

## 2014-08-09 NOTE — Discharge Summary (Signed)
  Patient not admitted had surgery on right ankle and sent home.

## 2014-08-09 NOTE — Discharge Instructions (Signed)
Elevate ankle for swelling . Wear boot. Weight bearing as tolerated. Keep dressing dry  For 48 hrs then may remove shower and rewrap ace bandage over sutures and reapply boot.   What to eat:  For your first meals, you should eat lightly; only small meals initially.  If you do not have nausea, you may eat larger meals.  Avoid spicy, greasy and heavy food.    General Anesthesia, Adult, Care After  Refer to this sheet in the next few weeks. These instructions provide you with information on caring for yourself after your procedure. Your health care provider may also give you more specific instructions. Your treatment has been planned according to current medical practices, but problems sometimes occur. Call your health care provider if you have any problems or questions after your procedure.  WHAT TO EXPECT AFTER THE PROCEDURE  After the procedure, it is typical to experience:  Sleepiness.  Nausea and vomiting. HOME CARE INSTRUCTIONS  For the first 24 hours after general anesthesia:  Have a responsible person with you.  Do not drive a car. If you are alone, do not take public transportation.  Do not drink alcohol.  Do not take medicine that has not been prescribed by your health care provider.  Do not sign important papers or make important decisions.  You may resume a normal diet and activities as directed by your health care provider.  Change bandages (dressings) as directed.  If you have questions or problems that seem related to general anesthesia, call the hospital and ask for the anesthetist or anesthesiologist on call. SEEK MEDICAL CARE IF:  You have nausea and vomiting that continue the day after anesthesia.  You develop a rash. SEEK IMMEDIATE MEDICAL CARE IF:  You have difficulty breathing.  You have chest pain.  You have any allergic problems. Document Released: 03/04/2001 Document Revised: 07/29/2013 Document Reviewed: 06/11/2013  [facility] Patient Information 2014 [place],  [place].

## 2014-08-09 NOTE — Op Note (Signed)
[name], [name]                ACCOUNT NO.:  [id number]  MEDICAL RECORD NO.:  [id number]  LOCATION:  [facility]                         FACILITY:  [place]  PHYSICIAN:  Kensie Susman C. Lorin Mercy, M.D.    DATE OF BIRTH:  19-Nov-1966  DATE OF PROCEDURE:  08/09/2014 DATE OF DISCHARGE:                              OPERATIVE REPORT   PREOPERATIVE DIAGNOSIS:  Status post right lateral ankle reconstruction with syndesmosis, open reduction and internal fixation with retained plate and screws.  POSTOPERATIVE DIAGNOSIS:  Status post right lateral ankle reconstruction with syndesmosis, open reduction and internal fixation with retained plate and screws.  PROCEDURE:  Placement of right ankle TightRope, removal of fibular plate and screws.  SURGEON:  Riot Barrick C. Lorin Mercy, MD  ANESTHESIA:  General.  TOURNIQUET TIME:  15 minutes.  IMPLANTS:  One TightRope.  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  She underwent ORIF, syndesmosis, placement of a two-holed syndesmotic plate on the fibula with 2 screws across the syndesmosis and a lateral ankle Brostrom ankle repair.  She had done well and now is here for hardware removal of the syndesmosis and placement of TightRope which will be permanently in place.  DESCRIPTION OF PROCEDURE:  After induction of  general anesthesia, Ancef prophylaxis, and time-out procedure, prepping with a calf tourniquet. Toes were covered with sterile glove after prepping.  Time-out procedure completed.  Usual extremity sheets and drapes  were applied.  Sterile skin marker was used in the old incision and incision was opened.  Freer elevator was used to uncover the soft tissue overlying the plate and screws.  Just distal to the plate after putting the screwdriver in the distal most screw hole since screws were parallel to the joint, this helped in alignment and the TightRope syndesmotic pin was drilled across, checked under C-arm, over drilled, and then passed with the TightRope needle through stab incision in the medial skin over the tibia, and then the TightRope was pulled down directly against the bone, tightened down securely.  Once this was tied down tight in multiple knots, the syndesmotic plate screw was removed, irrigated, tourniquet was deflated after 15 minutes, then 2-0 Vicryl, and nylon skin closure.  The patient tolerated the procedure well and was transferred to recovery room, will be discharged home.     William Schake C. Lorin Mercy, M.D.     MCY/MEDQ  D:  08/09/2014  T:  08/09/2014  Job:  [id number]

## 2014-08-09 NOTE — Brief Op Note (Signed)
08/09/2014  8:31 AM  PATIENT:  [name]  48 y.o. female  PRE-OPERATIVE DIAGNOSIS:  Status Post Open Reduction Internal Fixation Right Syndesmosis  POST-OPERATIVE DIAGNOSIS:  Status Post Open Reduction Internal Fixation Right   PROCEDURE:  Procedure(s): Placement Right Ankle Tight Rope, Removal Plate and Screws (Right)  SURGEON:  Surgeon(s) and Role:    * Marybelle Killings, MD - Primary  PHYSICIAN ASSISTANT:   ASSISTANTS: none   ANESTHESIA:   general  EBL:     BLOOD ADMINISTERED:none  DRAINS: none   LOCAL MEDICATIONS USED:  NONE  SPECIMEN:  No Specimen  DISPOSITION OF SPECIMEN:  N/A  COUNTS:  YES  TOURNIQUET:   Total Tourniquet Time Documented: Calf (Right) - 15 minutes Total: Calf (Right) - 15 minutes   DICTATION: .Other Dictation: Dictation Number [id number]  PLAN OF CARE: Discharge to home after PACU  PATIENT DISPOSITION:  PACU - hemodynamically stable.   Delay start of Pharmacological VTE agent (>24hrs) due to surgical blood loss or risk of bleeding: not applicable

## 2014-08-09 NOTE — Anesthesia Postprocedure Evaluation (Signed)
  Anesthesia Post-op Note  Patient: [name]  Procedure(s) Performed: Procedure(s): Placement Right Ankle Tight Rope, Removal Plate and Screws (Right)  Patient Location: PACU  Anesthesia Type:General  Level of Consciousness: alert  and oriented  Airway and Oxygen Therapy: Patient Spontanous Breathing and Patient connected to nasal cannula oxygen  Post-op Pain: mild  Post-op Assessment: Post-op Vital signs reviewed, Patient's Cardiovascular Status Stable, Respiratory Function Stable and No signs of Nausea or vomiting  Post-op Vital Signs: Reviewed  Last Vitals:  Filed Vitals:   08/09/14 0915  BP:   Pulse: 57  Temp:   Resp: 19    Complications: No apparent anesthesia complications

## 2014-08-09 NOTE — Transfer of Care (Signed)
Immediate Anesthesia Transfer of Care Note  Patient: [name]  Procedure(s) Performed: Procedure(s): Placement Right Ankle Tight Rope, Removal Plate and Screws (Right)  Patient Location: PACU  Anesthesia Type:General  Level of Consciousness: awake, alert , oriented, patient cooperative and responds to stimulation  Airway & Oxygen Therapy: Patient Spontanous Breathing  Post-op Assessment: Report given to PACU RN, Post -op Vital signs reviewed and stable and Patient moving all extremities X 4  Post vital signs: Reviewed and stable  Complications: No apparent anesthesia complications

## 2014-08-10 ENCOUNTER — Encounter (HOSPITAL_COMMUNITY): Payer: Self-pay | Admitting: Orthopaedic Surgery

## 2014-09-06 ENCOUNTER — Emergency Department (HOSPITAL_COMMUNITY): Payer: BC Managed Care – PPO

## 2014-09-06 ENCOUNTER — Emergency Department (HOSPITAL_COMMUNITY)
Admission: EM | Admit: 2014-09-06 | Discharge: 2014-09-06 | Disposition: A | Discharge status: Home / Self Care | Payer: BC Managed Care – PPO | Attending: Emergency Medicine | Admitting: Emergency Medicine

## 2014-09-06 ENCOUNTER — Encounter (HOSPITAL_COMMUNITY): Payer: Self-pay | Admitting: Emergency Medicine

## 2014-09-06 DIAGNOSIS — G43909 Migraine, unspecified, not intractable, without status migrainosus: Secondary | ICD-10-CM | POA: Insufficient documentation

## 2014-09-06 DIAGNOSIS — Z91040 Latex allergy status: Secondary | ICD-10-CM | POA: Diagnosis not present

## 2014-09-06 DIAGNOSIS — Z8719 Personal history of other diseases of the digestive system: Secondary | ICD-10-CM | POA: Insufficient documentation

## 2014-09-06 DIAGNOSIS — R110 Nausea: Secondary | ICD-10-CM | POA: Diagnosis not present

## 2014-09-06 DIAGNOSIS — Z79899 Other long term (current) drug therapy: Secondary | ICD-10-CM | POA: Insufficient documentation

## 2014-09-06 DIAGNOSIS — R079 Chest pain, unspecified: Secondary | ICD-10-CM

## 2014-09-06 DIAGNOSIS — Z8542 Personal history of malignant neoplasm of other parts of uterus: Secondary | ICD-10-CM | POA: Insufficient documentation

## 2014-09-06 DIAGNOSIS — J45901 Unspecified asthma with (acute) exacerbation: Secondary | ICD-10-CM | POA: Insufficient documentation

## 2014-09-06 DIAGNOSIS — Z8739 Personal history of other diseases of the musculoskeletal system and connective tissue: Secondary | ICD-10-CM | POA: Insufficient documentation

## 2014-09-06 LAB — CBC WITH DIFFERENTIAL/PLATELET
Basophils Absolute: 0 10*3/uL (ref 0.0–0.1)
Basophils Relative: 0 % (ref 0–1)
Eosinophils Absolute: 0.2 10*3/uL (ref 0.0–0.7)
Eosinophils Relative: 3 % (ref 0–5)
HCT: 37.6 % (ref 36.0–46.0)
Hemoglobin: 13 g/dL (ref 12.0–15.0)
Lymphocytes Relative: 32 % (ref 12–46)
Lymphs Abs: 2.1 10*3/uL (ref 0.7–4.0)
MCH: 33.3 pg (ref 26.0–34.0)
MCHC: 34.6 g/dL (ref 30.0–36.0)
MCV: 96.4 fL (ref 78.0–100.0)
Monocytes Absolute: 0.5 10*3/uL (ref 0.1–1.0)
Monocytes Relative: 8 % (ref 3–12)
Neutro Abs: 3.7 10*3/uL (ref 1.7–7.7)
Neutrophils Relative %: 57 % (ref 43–77)
Platelets: 199 10*3/uL (ref 150–400)
RBC: 3.9 MIL/uL (ref 3.87–5.11)
RDW: 12.7 % (ref 11.5–15.5)
WBC: 6.4 10*3/uL (ref 4.0–10.5)

## 2014-09-06 LAB — COMPREHENSIVE METABOLIC PANEL
ALT: 16 U/L (ref 0–35)
AST: 18 U/L (ref 0–37)
Albumin: 3.8 g/dL (ref 3.5–5.2)
Alkaline Phosphatase: 76 U/L (ref 39–117)
Anion gap: 10 (ref 5–15)
BUN: 9 mg/dL (ref 6–23)
CO2: 26 mEq/L (ref 19–32)
Calcium: 9.2 mg/dL (ref 8.4–10.5)
Chloride: 103 mEq/L (ref 96–112)
Creatinine, Ser: 0.81 mg/dL (ref 0.50–1.10)
GFR calc Af Amer: >90 mL/min (ref >90)
GFR calc non Af Amer: 86 mL/min — ABNORMAL LOW (ref >90)
Glucose, Bld: 93 mg/dL (ref 70–99)
Potassium: 4.4 mEq/L (ref 3.7–5.3)
Sodium: 139 mEq/L (ref 137–147)
Total Bilirubin: 0.4 mg/dL (ref 0.3–1.2)
Total Protein: 7.2 g/dL (ref 6.0–8.3)

## 2014-09-06 LAB — TROPONIN I
Troponin I: <0.3 ng/mL (ref <0.30)
Troponin I: <0.3 ng/mL (ref <0.30)

## 2014-09-06 MED ORDER — ONDANSETRON HCL 4 MG/2ML IJ SOLN
4.0000 mg | Freq: Once | INTRAMUSCULAR | Status: AC
  Administered 2014-09-06: 4 mg via INTRAVENOUS
  Filled 2014-09-06: qty 2

## 2014-09-06 MED ORDER — ASPIRIN 81 MG PO CHEW
324.0000 mg | CHEWABLE_TABLET | Freq: Once | ORAL | Status: AC
  Administered 2014-09-06: 324 mg via ORAL
  Filled 2014-09-06: qty 4

## 2014-09-06 NOTE — ED Notes (Signed)
Left sided cp intermittent x 3 days with achy pain and nausea per pt.

## 2014-09-06 NOTE — ED Provider Notes (Signed)
HEART score less than 3 Will repeat troponin at 3 hr mark   Sharyon Cable, MD 09/06/14 1109

## 2014-09-06 NOTE — Discharge Instructions (Signed)

## 2014-09-06 NOTE — ED Provider Notes (Signed)
CSN: [id number]     Arrival date & time 09/06/14  3235 History   First MD Initiated Contact with Patient 09/06/14 0935     Chief Complaint  Patient presents with  . Chest Pain     (Consider location/radiation/quality/duration/timing/severity/associated sxs/prior Treatment) The history is provided by the patient.   CAMDYNN MARANTO is a 48 y.o. female presenting with intermittent chest pressure, the first episode occuring 2 nights ago which woke her up.  The episode was accompanied by sob and last 1.5 hours, then resolved.  She had another episode last evening while at rest lasting about 1 hour. She describes an "elephant sitting on my chest" with radiation into her upper back, nausea without emesis.  She has had a stressful year with multiple surgeries including a vaginal mesh removal procedure at [facility], and most recently right ankle revision surgery with hardware removal 3 weeks ago, and is optimistic that these episodes are stress induced due to her stressful year.  She denies leg pain or swelling. She has a strong family history of cardiac disease, father with first MI at age 70. She has elevated triglycerides and LDL per screening in April, not currently on medication.  She has found no alleviators or triggers for her symptoms and is currently symptom free.    Past Medical History  Diagnosis Date  . Headache(784.0)     tension/migraines  . GERD (gastroesophageal reflux disease)     TUMS as needed  . Asthma     triggered by weather changes; no inhaler  . 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   . History of uterine cancer   . Stenosing tenosynovitis of finger 12/2012    left middle finger  . Ganglion cyst of wrist 12/2012    left dorsal cyst  . PONV (postoperative nausea and vomiting)     also states stopped breathing after hernia surgery   Past Surgical History  Procedure Laterality Date  . Pubovaginal sling  2010  . Vagina reconstruction surgery      x 5 since 2010  . Abdominal  hysterectomy  1994  . Ectopic pregnancy surgery  1995    left tube and ovary removed  . Appendectomy  1996  . Tubal ligation  1992  . Salpingoophorectomy  1996    right  . Ventral hernia repair  09/06/2006  . Cystoscopy with hydrodistension and biopsy  04/01/2007  . Trigger finger release  12/17/2012    Procedure: RELEASE TRIGGER FINGER/A-1 PULLEY;  Surgeon: Wynonia Sours, MD;  Location: [place];  Service: Orthopedics;  Laterality: Left;  . Ganglion cyst excision  12/17/2012    Procedure: REMOVAL GANGLION OF WRIST;  Surgeon: Wynonia Sours, MD;  Location: [place];  Service: Orthopedics;  Laterality: Left;  . Colonoscopy N/A 05/08/2013    Procedure: COLONOSCOPY;  Surgeon: Rogene Houston, MD;  [facility];  Service: Endoscopy;  Laterality: N/A;  240  . Hardware removal Right 08/09/2014    Procedure: Placement Right Ankle Tight Rope, Removal Plate and Screws;  Surgeon: Marybelle Killings, MD;  Location: [place];  Service: Orthopedics;  Laterality: Right;   Family History  Problem Relation Age of Onset  . Lung cancer Mother   . Bladder Cancer Mother   . Cervical cancer Mother   . Heart disease Father   . Cancer Maternal Grandmother    History  Substance Use Topics  . Smoking status: Never Smoker   . Smokeless tobacco: Never  Used  . Alcohol Use: No   OB History   Grav Para Term Preterm Abortions TAB SAB Ect Mult Living   3 2  2 1   1        Review of Systems  Constitutional: Negative for fever and diaphoresis.  HENT: Negative for congestion and sore throat.   Eyes: Negative.   Respiratory: Positive for shortness of breath. Negative for chest tightness.   Cardiovascular: Positive for chest pain.  Gastrointestinal: Positive for nausea. Negative for vomiting and abdominal pain.  Genitourinary: Negative.   Musculoskeletal: Negative for arthralgias, joint swelling and neck pain.  Skin: Negative.  Negative for rash and wound.  Neurological: Negative  for dizziness, weakness, light-headedness, numbness and headaches.  Psychiatric/Behavioral: Negative.       Allergies  Imitrex; Percocet; Cymbalta; Latex; Propoxyphene n-acetaminophen; and Tramadol  Home Medications   Prior to Admission medications   Medication Sig Start Date End Date Taking? Authorizing Provider  Biotin 1000 MCG tablet Take 1,000 mcg by mouth 2 (two) times daily.   Yes Historical Provider, MD  estradiol (VIVELLE-DOT) 0.075 MG/24HR Place 1 patch onto the skin 2 (two) times a week. Tuesdays and fridays   Yes Historical Provider, MD  gabapentin (NEURONTIN) 400 MG capsule Take 800 mg by mouth at bedtime.  04/16/14 04/16/15 Yes Historical Provider, MD  HYDROcodone-acetaminophen (NORCO) 5-325 MG per tablet Take 2 tablets by mouth every 6 (six) hours as needed for moderate pain or severe pain. 08/09/14  Yes Marybelle Killings, MD  Multiple Vitamin (MULTIVITAMIN WITH MINERALS) TABS tablet Take 1 tablet by mouth daily.   Yes Historical Provider, MD  ondansetron (ZOFRAN) 4 MG tablet Take 4 mg by mouth every 6 (six) hours as needed for nausea or vomiting.   Yes Historical Provider, MD  terbinafine (LAMISIL) 250 MG tablet Take 250 mg by mouth daily.   Yes Historical Provider, MD   BP 97/59  Pulse 72  Temp(Src) 98.9 F (37.2 C) (Oral)  Resp 14  Wt 168 lb (76.204 kg)  SpO2 99% Physical Exam  Nursing note and vitals reviewed. Constitutional: She appears well-developed and well-nourished.  HENT:  Head: Normocephalic and atraumatic.  Eyes: Conjunctivae are normal.  Neck: Normal range of motion.  Cardiovascular: Normal rate, regular rhythm, normal heart sounds and intact distal pulses.   Pulmonary/Chest: Effort normal and breath sounds normal. No respiratory distress. She has no wheezes. She exhibits no tenderness.  Abdominal: Soft. Bowel sounds are normal. She exhibits no distension. There is no tenderness. There is no rebound.  Musculoskeletal: Normal range of motion. She exhibits no  edema and no tenderness.  Well healing right ankle surgical incision.  Neurological: She is alert.  Skin: Skin is warm and dry.  Psychiatric: She has a normal mood and affect.    ED Course  Procedures (including critical care time) Labs Review Labs Reviewed  COMPREHENSIVE METABOLIC PANEL - Abnormal; Notable for the following:    GFR calc non Af Amer 86 (*)    All other components within normal limits  TROPONIN I  CBC WITH DIFFERENTIAL  TROPONIN I    Imaging Review Dg Chest 2 View  09/06/2014   CLINICAL DATA:  Chest pain for 2 days.  EXAM: CHEST  2 VIEW  COMPARISON:  02/16/2011.  FINDINGS: AP and lateral views of the chest show no focal airspace consolidation or pulmonary edema. No pleural effusion or pneumothorax. The cardiopericardial silhouette is within normal limits for size. Imaged bony structures of the thorax are intact.  Telemetry leads overlie the chest.  IMPRESSION: No active cardiopulmonary disease.   Electronically Signed   By: Misty Stanley M.D.   On: 09/06/2014 10:26     EKG Interpretation   Date/Time:  Monday September 06 2014 12:45:01 EDT Ventricular Rate:  63 PR Interval:  171 QRS Duration: 82 QT Interval:  418 QTC Calculation: 428 R Axis:   115 Text Interpretation:  Sinus rhythm Right axis deviation Low voltage,  precordial leads No significant change since last tracing Confirmed by  Christy Gentles  MD, Elenore Rota ([id number]) on 09/06/2014 12:54:21 PM      MDM   Final diagnoses:  Chest pain, unspecified chest pain type    Pt with cardiac risk factors including fam hx, elevated chol.  Doubt PE  given intermittent sx.    Patients labs and/or radiological studies were viewed and considered during the medical decision making and disposition process. Troponin x 2 negative.  Pt remained sx free while here.  She was seen by Dr. Christy Gentles during this visit.  She was dc'd home with close outpatient f/u with cardiology, referral given. Pt understands and agrees with  plan.    Evalee Jefferson, PA-C 09/07/14 1751

## 2014-09-06 NOTE — ED Provider Notes (Signed)
Patient seen/examined in the Emergency Department in conjunction with Midlevel Provider [place] Patient reports chest pain Exam : awake/alert, no distress.  She is watching TV Plan: will monitor in ED,  Will repeat EKG/Troponin at 3 hr mark    Sharyon Cable, MD 09/06/14 1035

## 2014-09-07 ENCOUNTER — Ambulatory Visit (HOSPITAL_COMMUNITY)
Admission: RE | Admit: 2014-09-07 | Discharge: 2014-09-07 | Disposition: A | Discharge status: Home / Self Care | Payer: BC Managed Care – PPO | Source: Ambulatory Visit | Attending: Orthopaedic Surgery | Admitting: Orthopaedic Surgery

## 2014-09-07 DIAGNOSIS — M25571 Pain in right ankle and joints of right foot: Secondary | ICD-10-CM

## 2014-09-07 DIAGNOSIS — M25579 Pain in unspecified ankle and joints of unspecified foot: Secondary | ICD-10-CM | POA: Diagnosis not present

## 2014-09-07 DIAGNOSIS — M25673 Stiffness of unspecified ankle, not elsewhere classified: Secondary | ICD-10-CM | POA: Insufficient documentation

## 2014-09-07 DIAGNOSIS — M25676 Stiffness of unspecified foot, not elsewhere classified: Secondary | ICD-10-CM | POA: Diagnosis not present

## 2014-09-07 DIAGNOSIS — R262 Difficulty in walking, not elsewhere classified: Secondary | ICD-10-CM | POA: Insufficient documentation

## 2014-09-07 DIAGNOSIS — IMO0001 Reserved for inherently not codable concepts without codable children: Secondary | ICD-10-CM | POA: Diagnosis present

## 2014-09-07 NOTE — Evaluation (Signed)
Physical Therapy Evaluation  Patient Details  Name: [name] MRN: [id number] Date of Birth: [birth year]  Today's Date: 09/07/2014 Time: 5809-9833 PT Time Calculation (min): 45 min Charge: Evaluation              Visit#: 1 of 8  Re-eval: 10/07/14 Assessment Diagnosis: ORIF Rt ankle Surgical Date: 08/09/14 Next MD Visit: 09/16/2014 Prior Therapy: none  Authorization: BCBS    Past Medical History:  Past Medical History  Diagnosis Date  . Headache(784.0)     tension/migraines  . GERD (gastroesophageal reflux disease)     TUMS as needed  . Asthma     triggered by weather changes; no inhaler  . 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   . History of uterine cancer   . Stenosing tenosynovitis of finger 12/2012    left middle finger  . Ganglion cyst of wrist 12/2012    left dorsal cyst  . PONV (postoperative nausea and vomiting)     also states stopped breathing after hernia surgery   Past Surgical History:  Past Surgical History  Procedure Laterality Date  . Pubovaginal sling  2010  . Vagina reconstruction surgery      x 5 since 2010  . Abdominal hysterectomy  1994  . Ectopic pregnancy surgery  1995    left tube and ovary removed  . Appendectomy  1996  . Tubal ligation  1992  . Salpingoophorectomy  1996    right  . Ventral hernia repair  09/06/2006  . Cystoscopy with hydrodistension and biopsy  04/01/2007  . Trigger finger release  12/17/2012    Procedure: RELEASE TRIGGER FINGER/A-1 PULLEY;  Surgeon: Wynonia Sours, MD;  Location: [facility];  Service: Orthopedics;  Laterality: Left;  . Ganglion cyst excision  12/17/2012    Procedure: REMOVAL GANGLION OF WRIST;  Surgeon: Wynonia Sours, MD;  Location: [facility];  Service: Orthopedics;  Laterality: Left;  . Colonoscopy N/A 05/08/2013    Procedure: COLONOSCOPY;  Surgeon: Rogene Houston, MD;  Location[facility];  Service: Endoscopy;  Laterality: N/A;  240  . Hardware removal Right 08/09/2014     Procedure: Placement Right Ankle Tight Rope, Removal Plate and Screws;  Surgeon: Marybelle Killings, MD;  Location: [facility];  Service: Orthopedics;  Laterality: Right;    Subjective Symptoms/Limitations Symptoms: Ms. Stegemann had a Rt ORIF on 08/09/2014 to remove plates and screws and have a tight rope placed in order to decrease her pain and improve her mobility.  She states since the surgery her ankle is a little better.  Today is the first day she has been without crutches.  She states she is unable to walk normal using a step to gait pattern.  She states that the scar is painful to the touch.    Pertinent History: Pt initially fell in a hole on Feburary 4th and had surgery .  How long can you sit comfortably?: PT states she needs to prop her foot up after 5-10 minutes.   Pt has fallen twice since the surgery once [place] and [place].  How long can you stand comfortably?: 5-10 minutes How long can you walk comfortably?: with crutches pt is able to walk for about  30 minutes but without crutches she has not walked greater than five minutes.    Pain Assessment Currently in Pain?: Yes Pain Score: 5  Pain Location: Ankle Pain Orientation: Right Pain Type: Chronic pain Pain Onset: More  than a month ago Pain Frequency: Intermittent Pain Relieving Factors: ice and elevation  Effect of Pain on Daily Activities: increases pain.  Precautions/Restrictions     Balance Screening Balance Screen Has the patient fallen in the past 6 months: Yes How many times?: 2 Has the patient had a decrease in activity level because of a fear of falling? : No Is the patient reluctant to leave their home because of a fear of falling? : No  Prior Functioning  Prior Function Vocation: Full time employment Vocation Requirements: climbing ladder, walking  Leisure: Hobbies-yes (Comment) Comments: walk, swim,   Cognition/Observation    Sensation/Coordination/Flexibility/Functional Tests Functional  Tests Functional Tests: foto 46   Assessment RLE AROM (degrees) Right Ankle Dorsiflexion: 0 Right Ankle Plantar Flexion: 40 Right Ankle Inversion: 20 Right Ankle Eversion: 10 RLE Strength Right Ankle Dorsiflexion: 4/5 (in available range) Right Ankle Plantar Flexion: 3/5 Right Ankle Inversion: 3/5 Right Ankle Eversion: 3-/5  Exercise/Treatments Mobility/Balance  Ambulation/Gait Ambulation/Gait: Yes Gait Pattern: Step-to pattern;Decreased step length - left;Decreased dorsiflexion - right   Ankle Stretches Gastroc Stretch: 3 reps;30 seconds   Ankle Exercises - Seated ABC's: 1 rep Ankle Circles/Pumps: 5 reps   Ankle Exercises - Sidelying Ankle Inversion: 5 reps Ankle Eversion: 5 reps    Physical Therapy Assessment and Plan PT Assessment and Plan Clinical Impression Statement: Pt is a 48 yo female who has been referred to therapy following removal of screws in her ankle and placement of a tight rope.  Examination demonstrates increased swelling, decreased ROM, decreased strength, decreased balance, increased pain and diffinculty walking.  Ms. Flott will benefit from skillled PT to address these issues and return the patient back to her previous level of activitiy .  Pt will benefit from skilled therapeutic intervention in order to improve on the following deficits: Abnormal gait;Decreased activity tolerance;Decreased balance;Difficulty walking;Pain;Decreased strength;Decreased range of motion;Increased fascial restricitons Rehab Potential: Good PT Frequency: Min 2X/week PT Duration: 4 weeks PT Treatment/Interventions: Gait training;Therapeutic activities;Therapeutic exercise;Manual techniques;Patient/family education PT Plan: begin weight bearing activity including rockerboard, baps, heelraise, toe raise, SLS to promote strength, ROM, and balance.     Goals Home Exercise Program Pt/caregiver will Perform Home Exercise Program: For increased ROM;For increased  strengthening PT Goal: Perform Home Exercise Program - Progress: Goal set today PT Short Term Goals Time to Complete Short Term Goals: 2 weeks PT Short Term Goal 1: Pain level to be no more than a 5/10 80 % of the day PT Short Term Goal 2: Improve ROM by 5 degrees to allow more normalized gt PT Short Term Goal 3: Pt to be able to sit for an hour with comfort PT Short Term Goal 4: Pt to be able to stand for 20 minutes to make a small meal  PT Short Term Goal 5: Pt to be able to walk for 30 minutes to be able to shop PT Long Term Goals Time to Complete Long Term Goals: 4 weeks PT Long Term Goal 1: Pt pain to be no greater than a 2/10 PT Long Term Goal 2: Pt ROM to be improved by 10 degrees to allow normalized gt Long Term Goal 3: Pt to be able to sit for two hours to allow traveling Long Term Goal 4: Pt to be able to stand for 30 minutes to be able to socialize PT Long Term Goal 5: Pt to be able to walk for an hour to return to work duties   Problem List Patient Active Problem List   Diagnosis  Date Noted  . Stiffness of joint, not elsewhere classified, ankle and foot 09/07/2014  . Pain in joint, ankle and foot 09/07/2014  . Difficulty in walking(719.7) 09/07/2014  . 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 Hematochezia 08/11/2013  . Constipation 08/11/2013  . RHEUMATOID ARTHRITIS 02/20/2010  . KNEE PAIN 02/20/2010  . FIBROMYALGIA 02/20/2010    PT Plan of Care PT Home Exercise Plan: given for ROM   GP    RUSSELL,CINDY 09/07/2014, 5:25 PM  Physician Documentation Your signature is required to indicate approval of the treatment plan as stated above.  Please sign and either send electronically or make a copy of this report for your files and return this physician signed original.   Please mark one 1.__approve of plan  2. ___approve of plan with the following conditions.   ______________________________                                                           _____________________ Physician Signature                                                                                                             Date

## 2014-09-08 ENCOUNTER — Ambulatory Visit (HOSPITAL_COMMUNITY)
Admission: RE | Admit: 2014-09-08 | Discharge: 2014-09-08 | Disposition: A | Discharge status: Home / Self Care | Payer: BC Managed Care – PPO | Source: Ambulatory Visit | Attending: Physical Therapy | Admitting: Physical Therapy

## 2014-09-08 DIAGNOSIS — IMO0001 Reserved for inherently not codable concepts without codable children: Secondary | ICD-10-CM | POA: Diagnosis not present

## 2014-09-08 NOTE — Progress Notes (Signed)
Physical Therapy Treatment Patient Details  Name: [name] MRN: [id number] Date of Birth: 1966-01-15  Today's Date: 09/08/2014 Time: 1016-1102 PT Time Calculation (min): 46 min 29' TE 10' manual  Visit#: 2 of 8  Re-eval: 10/07/14    Authorization: BCBS  Authorization Time Period:    Authorization Visit#:   of     Subjective: Symptoms/Limitations Symptoms: Pt enters gym without crutches and antalgic gait. Pt complains of 3/10 R ankle pain.      Exercise/Treatments Mobility/Balance        Ankle Stretches Gastroc Stretch: Limitations (too painful today)   Ankle Exercises - Standing SLS: 2x 20" with B finger tips for balance Other Standing Ankle Exercises: 30' gait training without AD;focus step thru and heel/toe pattern Other Standing Ankle Exercises: squats x10:weight shifting in all directions and WS on RLE only with backwards half "C" Ankle Exercises - Seated ABC's: 1 rep Ankle Circles/Pumps: 10 reps;Limitations (cc/cw) BAPS: Sitting;Level 3;Limitations (30" cc/cw each) Manual Therapy Manual Therapy: Joint mobilization Joint Mobilization: grade 1 oscillation for pain  Physical Therapy Assessment and Plan PT Assessment and Plan Clinical Impression Statement: Patient experienced 9/10 pain today with rockerboard, slantboard  and gait today with medial aspect of R ankle,  bruising appears here as well . Eversion at full ROM in also very painful PROM/AROM  as well.Added seated BAPS, squats. SLS and HR/TR all tolerated well. Performed grade 1 oscillating ankle mobs of R ankle for pain relief, which patient stated "that feels good"  PT Plan: begin weight bearing activity including rockerboard,,SLS to promote strength, ROM, and balance. :continue with PT POC    Goals    Problem List Patient Active Problem List   Diagnosis Date Noted  . Stiffness of joint, not elsewhere classified, ankle and foot 09/07/2014  . Pain in joint, ankle and foot 09/07/2014  . Difficulty  in walking(719.7) 09/07/2014  . 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 Hematochezia 08/11/2013  . Constipation 08/11/2013  . RHEUMATOID ARTHRITIS 02/20/2010  . KNEE PAIN 02/20/2010  . FIBROMYALGIA 02/20/2010       GP    Cincere Zorn, [place] 09/08/2014, 12:17 PM

## 2014-09-08 NOTE — ED Provider Notes (Signed)
Medical screening examination/treatment/procedure(s) were conducted as a shared visit with non-physician practitioner(s) and myself.  I personally evaluated the patient during the encounter.   EKG Interpretation   Date/Time:  Monday September 06 2014 12:45:01 EDT Ventricular Rate:  63 PR Interval:  171 QRS Duration: 82 QT Interval:  418 QTC Calculation: 428 R Axis:   115 Text Interpretation:  Sinus rhythm Right axis deviation Low voltage,  precordial leads No significant change since last tracing Confirmed by  Christy Gentles  MD, Maeola Mchaney ([id number]) on 09/06/2014 12:54:21 PM        Sharyon Cable, MD 09/08/14 [phone]

## 2014-09-14 ENCOUNTER — Ambulatory Visit (HOSPITAL_COMMUNITY): Payer: BC Managed Care – PPO | Admitting: Physical Therapy

## 2014-09-17 ENCOUNTER — Ambulatory Visit (HOSPITAL_COMMUNITY)
Admission: RE | Admit: 2014-09-17 | Discharge: 2014-09-17 | Disposition: A | Discharge status: Home / Self Care | Payer: BC Managed Care – PPO | Source: Ambulatory Visit | Attending: Orthopaedic Surgery | Admitting: Orthopaedic Surgery

## 2014-09-17 DIAGNOSIS — M25579 Pain in unspecified ankle and joints of unspecified foot: Secondary | ICD-10-CM | POA: Diagnosis not present

## 2014-09-17 DIAGNOSIS — M25671 Stiffness of right ankle, not elsewhere classified: Secondary | ICD-10-CM | POA: Diagnosis present

## 2014-09-17 DIAGNOSIS — Z9889 Other specified postprocedural states: Secondary | ICD-10-CM | POA: Diagnosis not present

## 2014-09-17 DIAGNOSIS — R262 Difficulty in walking, not elsewhere classified: Secondary | ICD-10-CM | POA: Insufficient documentation

## 2014-09-17 DIAGNOSIS — M25371 Other instability, right ankle: Secondary | ICD-10-CM | POA: Insufficient documentation

## 2014-09-17 DIAGNOSIS — M25571 Pain in right ankle and joints of right foot: Secondary | ICD-10-CM

## 2014-09-17 NOTE — Progress Notes (Signed)
Physical Therapy Treatment Patient Details  Name: [name] MRN: [id number] Date of Birth: [birth year]  Today's Date: 09/17/2014 Time: 3007-6226 PT Time Calculation (min): 96 min Charge: TE 3335-4562, [place] [id number]  Visit#: 3 of 8  Re-eval: 10/07/14 Assessment Diagnosis: ORIF Rt ankle Surgical Date: 08/09/14 Next MD Visit: 3 weeks from 09/17/2014 Prior Therapy: none  Authorization: BCBS  Authorization Time Period:    Authorization Visit#:   of     Subjective: Symptoms/Limitations Symptoms: Pain free today, has been putting on numbness cream prior exercising at home that seems to help a lot.  Plans to return to work on Monday. Pain Assessment Currently in Pain?: No/denies  Objective:  Exercise/Treatments Ankle Stretches Gastroc Stretch: 3 reps;30 seconds;Limitations Gastroc Stretch Limitations: sitting with rope and standing Ankle Exercises - Standing SLS: 2 x 40" with Bil HHA iniitially then 1 thumb Rocker Board: 2 minutes;Limitations Rocker Board Limitations: R/L Other Standing Ankle Exercises: Gait training x 35' with cueing for toe push off to normalize gait. Other Standing Ankle Exercises: 3D ankle excursion for dorsi/plantar flexion and eversion/inversion 5 reps- HEP given Ankle Exercises - Seated BAPS: Sitting;Level 3;10 reps    Manual Therapy Manual Therapy: Other (comment) Edema Management: Retro massage with LE elevated and ankle pumps with edema control; very light as pt sensitive anterior tib region Joint Mobilization: grade 1 oscillation for pain Myofascial Release: MFR to Anterior tibalis  Other Manual Therapy: Gentle PROM all directions'  Physical Therapy Assessment and Plan PT Assessment and Plan Clinical Impression Statement: Session focus on improving ankle AROM and manual techniques to reduce edema, myofascial restrictions and to improve AROM.  Continued with BAPS, rocker board and SLS activities to improve ankle ROM.  Added 3D ankle  excursion and pt given HEP printout to begin at work to assist with ROM and stiffness as pt is to return to work next week.  Pt stated increased pain with weight bearing activities.  Pt  encouraged to apply ice to ankle with elevation for pain and edema control. PT Plan: Continue with weight bearing activiies including rockerboard,,SLS to promote strength, ROM, and balance. :continue with PT POC    Goals PT Short Term Goals PT Short Term Goal 1: Pain level to be no more than a 5/10 80 % of the day PT Short Term Goal 2: Improve ROM by 5 degrees to allow more normalized gt PT Short Term Goal 2 - Progress: Progressing toward goal PT Short Term Goal 3: Pt to be able to sit for an hour with comfort PT Short Term Goal 3 - Progress: Progressing toward goal PT Short Term Goal 4: Pt to be able to stand for 20 minutes to make a small meal  PT Short Term Goal 4 - Progress: Progressing toward goal PT Short Term Goal 5: Pt to be able to walk for 30 minutes to be able to shop PT Short Term Goal 5 - Progress: Progressing toward goal PT Long Term Goals PT Long Term Goal 1: Pt pain to be no greater than a 2/10 PT Long Term Goal 2: Pt ROM to be improved by 10 degrees to allow normalized gt Long Term Goal 3: Pt to be able to sit for two hours to allow traveling Long Term Goal 4: Pt to be able to stand for 30 minutes to be able to socialize PT Long Term Goal 5: Pt to be able to walk for an hour to return to work duties   Problem List Patient Active Problem List  Diagnosis Date Noted  . Stiffness of joint, not elsewhere classified, ankle and foot 09/07/2014  . Pain in joint, ankle and foot 09/07/2014  . Difficulty in walking(719.7) 09/07/2014  . 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 Hematochezia 08/11/2013  . Constipation 08/11/2013  . RHEUMATOID ARTHRITIS 02/20/2010  . KNEE PAIN 02/20/2010  . FIBROMYALGIA 02/20/2010    PT - End of Session Activity Tolerance: Patient tolerated treatment  well General Behavior During Therapy: [facility] for tasks assessed/performed  GP    Aldona Lento 09/17/2014, 5:11 PM

## 2014-09-20 ENCOUNTER — Ambulatory Visit (HOSPITAL_COMMUNITY): Payer: BC Managed Care – PPO | Admitting: Physical Therapy

## 2014-09-22 ENCOUNTER — Ambulatory Visit (HOSPITAL_COMMUNITY)
Admission: RE | Admit: 2014-09-22 | Discharge: 2014-09-22 | Disposition: A | Discharge status: Home / Self Care | Payer: BC Managed Care – PPO | Source: Ambulatory Visit

## 2014-09-22 DIAGNOSIS — M25671 Stiffness of right ankle, not elsewhere classified: Secondary | ICD-10-CM | POA: Diagnosis not present

## 2014-09-22 DIAGNOSIS — M25571 Pain in right ankle and joints of right foot: Secondary | ICD-10-CM

## 2014-09-22 NOTE — Progress Notes (Signed)
Physical Therapy Treatment Patient Details  Name: [name] MRN: [id number] Date of Birth: January 28, 1966  Today's Date: 09/22/2014 Time: 4431-5400 PT Time Calculation (min): 27 min Charge: TE 8676-1950, 9326-7124;  Manual [id number], [place] [phone]  Visit#: 4 of 8  Re-eval: 10/07/14 Assessment Diagnosis: ORIF Rt ankle Surgical Date: 08/09/14 Next MD Visit: Lorin Mercy 10/29 Prior Therapy: none  Authorization: BCBS  Authorization Time Period:    Authorization Visit#:   of     Subjective: Symptoms/Limitations Symptoms: Began work on Monday, noted increased swelling with standing for long periods of time.  Pain scale 5/10 today Pain Assessment Currently in Pain?: Yes Pain Score: 5  Pain Location: Ankle Pain Orientation: Right  Objective:   Exercise/Treatments Ankle Stretches Gastroc Stretch: 3 reps;30 seconds;Limitations Gastroc Stretch Limitations: standing Ankle Exercises - Standing Rocker Board: 2 minutes;Limitations Rocker Board Limitations: R/L Other Standing Ankle Exercises: 3D hip excursion Other Standing Ankle Exercises: knee drives on 8in step to increase dorsi and plantar flexion 10x3" Ankle Exercises - Seated BAPS: Sitting;Level 3;10 reps    Manual Therapy Edema Management: Retro massage with LE elevated and ankle pumps with edema control. Other Manual Therapy: Gentle PROM all directions  Physical Therapy Assessment and Plan PT Assessment and Plan Clinical Impression Statement: Manual techniques complete for edema and pain control, pt with decreased sensations medial region on ankle, able to increase pressure with increase tolerance.  Pt educated on benefits for compression hose.  Added ROM and weight bearing exercises, therapist facilitation for proper form and technique.  Ended session with ice for pain and edema control.   PT Plan: Continue with weight bearing activiies including rockerboard,,SLS to promote strength, ROM, and balance. :continue with PT POC.   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      Goals PT Short Term Goals PT Short Term Goal 1: Pain level to be no more than a 5/10 80 % of the day PT Short Term Goal 1 - Progress: Progressing toward goal PT Short Term Goal 2: Improve ROM by 5 degrees to allow more normalized gt PT Short Term Goal 2 - Progress: Progressing toward goal PT Short Term Goal 3: Pt to be able to sit for an hour with comfort PT Short Term Goal 3 - Progress: Progressing toward goal PT Short Term Goal 4: Pt to be able to stand for 20 minutes to make a small meal  PT Short Term Goal 4 - Progress: Progressing toward goal PT Short Term Goal 5: Pt to be able to walk for 30 minutes to be able to shop PT Long Term Goals PT Long Term Goal 1: Pt pain to be no greater than a 2/10 PT Long Term Goal 2: Pt ROM to be improved by 10 degrees to allow normalized gt Long Term Goal 3: Pt to be able to sit for two hours to allow traveling Long Term Goal 4: Pt to be able to stand for 30 minutes to be able to socialize PT Long Term Goal 5: Pt to be able to walk for an hour to return to work duties   Problem List Patient Active Problem List   Diagnosis Date Noted  . Stiffness of joint, not elsewhere classified, ankle and foot 09/07/2014  . Pain in joint, ankle and foot 09/07/2014  . Difficulty in walking(719.7) 09/07/2014  . 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  Hematochezia 08/11/2013  . Constipation 08/11/2013  . RHEUMATOID ARTHRITIS 02/20/2010  . KNEE PAIN 02/20/2010  . FIBROMYALGIA 02/20/2010    PT - End of Session Activity Tolerance: Patient tolerated treatment well General Behavior During Therapy: [facility] for tasks assessed/performed  GP    Aldona Lento 09/22/2014, 3:42 PM

## 2014-09-24 ENCOUNTER — Ambulatory Visit (HOSPITAL_COMMUNITY)
Admission: RE | Admit: 2014-09-24 | Discharge: 2014-09-24 | Disposition: A | Discharge status: Home / Self Care | Payer: BC Managed Care – PPO | Source: Ambulatory Visit | Attending: Physical Therapy | Admitting: Physical Therapy

## 2014-09-24 DIAGNOSIS — M25671 Stiffness of right ankle, not elsewhere classified: Secondary | ICD-10-CM | POA: Diagnosis not present

## 2014-09-24 DIAGNOSIS — M25571 Pain in right ankle and joints of right foot: Secondary | ICD-10-CM

## 2014-09-24 NOTE — Progress Notes (Signed)
Physical Therapy Treatment Patient Details  Name: [name] MRN: [id number] Date of Birth: 1966-02-29  Today's Date: 09/24/2014 Time: 0932-1022 PT Time Calculation (min): 50 min Charge: Manual [id number], TE 5176-1607, [place] 3710-6269  Visit#: 5 of 8  Re-eval: 10/07/14 Assessment Diagnosis: ORIF Rt ankle Surgical Date: 08/09/14 Next MD Visit: Lorin Mercy 10/29 Prior Therapy: none  Authorization: BCBS  Authorization Time Period:    Authorization Visit#:   of     Subjective: Symptoms/Limitations Symptoms: Pain scale 5/10, increased swelling following work yesterday.  Had to climb ladders a bunch last night wth only 1 hour seated break.   Pain Assessment Currently in Pain?: Yes Pain Score: 5  Pain Location: Ankle Pain Orientation: Right  Objective:   Exercise/Treatments Ankle Stretches Gastroc Stretch: 3 reps;30 seconds;Limitations Gastroc Stretch Limitations: supine and standing Ankle Exercises - Standing Rocker Board: 2 minutes;Limitations Rocker Board Limitations: R/L Other Standing Ankle Exercises: 3D hip excursion Other Standing Ankle Exercises: 2D walking hip excursion    Modalities Modalities: Cryotherapy Manual Therapy Manual Therapy: Other (comment) Edema Management: Retro massage with LE elevated and ankle pumps with edema control  Other Manual Therapy: Gentle PROM all directions Cryotherapy Number Minutes Cryotherapy: 10 Minutes Cryotherapy Location: Ankle Type of Cryotherapy: Ice pack  Physical Therapy Assessment and Plan PT Assessment and Plan Clinical Impression Statement: Began session with manual techniiques for edema, pain control and to reduce fascial restrictions primarily medial portion.  Pt with improve sensation tolerance with ability to increase pressure with no c/o. Added 2D hip excursion walking to ijmprove weight distribution with gait.  Ended session with ice for pain control.  Pt stated pain reduced to 2/10 at end of session.   PT Plan:  Continue with weight bearing activiies including rockerboard,,SLS to promote strength, ROM, and balance. :continue with PT POC.  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      Goals PT Short Term Goals PT Short Term Goal 1: Pain level to be no more than a 5/10 80 % of the day PT Short Term Goal 1 - Progress: Progressing toward goal PT Short Term Goal 2: Improve ROM by 5 degrees to allow more normalized gt PT Short Term Goal 2 - Progress: Progressing toward goal PT Short Term Goal 3: Pt to be able to sit for an hour with comfort PT Short Term Goal 3 - Progress: Progressing toward goal PT Short Term Goal 4: Pt to be able to stand for 20 minutes to make a small meal  PT Short Term Goal 4 - Progress: Progressing toward goal PT Short Term Goal 5: Pt to be able to walk for 30 minutes to be able to shop PT Short Term Goal 5 - Progress: Progressing toward goal PT Long Term Goals PT Long Term Goal 1: Pt pain to be no greater than a 2/10 PT Long Term Goal 2: Pt ROM to be improved by 10 degrees to allow normalized gt Long Term Goal 3: Pt to be able to sit for two hours to allow traveling Long Term Goal 4: Pt to be able to stand for 30 minutes to be able to socialize PT Long Term Goal 5: Pt to be able to walk for an hour to return to work duties   Problem List Patient Active Problem List   Diagnosis Date Noted  . Stiffness of joint, not elsewhere  classified, ankle and foot 09/07/2014  . Pain in joint, ankle and foot 09/07/2014  . Difficulty in walking(719.7) 09/07/2014  . 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 Hematochezia 08/11/2013  . Constipation 08/11/2013  . RHEUMATOID ARTHRITIS 02/20/2010  . KNEE PAIN 02/20/2010  . FIBROMYALGIA 02/20/2010    PT - End of Session Activity Tolerance: Patient tolerated treatment  well General Behavior During Therapy: [facility] for tasks assessed/performed  GP    Aldona Lento 09/24/2014, 10:46 AM

## 2014-09-27 ENCOUNTER — Ambulatory Visit (HOSPITAL_COMMUNITY)
Admission: RE | Admit: 2014-09-27 | Discharge: 2014-09-27 | Disposition: A | Discharge status: Home / Self Care | Payer: BC Managed Care – PPO | Source: Ambulatory Visit | Attending: Physical Therapy | Admitting: Physical Therapy

## 2014-09-27 DIAGNOSIS — M25671 Stiffness of right ankle, not elsewhere classified: Secondary | ICD-10-CM | POA: Diagnosis not present

## 2014-09-27 NOTE — Progress Notes (Signed)
Physical Therapy Treatment Patient Details  Name: [name] MRN: [id number] Date of Birth: 1966-06-04  Today's Date: 09/27/2014 Time: 4098-1191 PT Time Calculation (min): 43 min TE 1432-1500, MT 1500-1510, Ice 1510-1515 Visit#: 6 of 8  Re-eval: 10/07/14 Assessment Diagnosis: ORIF Rt ankle Surgical Date: 08/09/14 Next MD Visit: Lorin Mercy 10/29   Subjective: Symptoms/Limitations Symptoms: Pt reports no complaints of pain in the ankle today, only stiffness on the medial aspect of the ankle.  Pt demonstrates improving gait patterning with increased WB on the Rt ankle.  How long can you sit comfortably?: Pt able to sit for unlimited time. (was 5-10 minutes with foot propped) Pain Assessment Currently in Pain?: No/denies   Exercise/Treatments Ankle Stretches Gastroc Stretch: 3 reps;30 seconds;Limitations Gastroc Stretch Limitations: Slantboard Other Stretch: HS Stretch, 14" Box 30" x2 Ankle Exercises - Standing Vector Stance: 2 reps;Limitations Vector Stance Limitations: 20" SLS: Lt 60", Rt 10" x2 with touching 1 UE as needed Heel Raises: 10 reps;Limitations Heel Raises Limitations: x10 on floor, x10 on airex Toe Raise: 10 reps;Limitations Toe Raise Limitations: x10 on floor, x10 on airex Other Standing Ankle Exercises: 3D hip excursion Ankle Exercises - Seated Ankle Circles/Pumps: 10 reps  Physical Therapy Assessment and Plan PT Assessment and Plan Clinical Impression Statement: Pt presents to clinic with improving gait pattern, with decreased WB on the Rt foot though pt is able to tolerance increased WB during gait than last session (where pt was dragging leg).  Assessed SLS balance with pt only able to maintain balance 4" without HHA, and 10 seconds with limited touches of 1 UE.  Educated pt to continue to increase tolerance at home, increasing WB and normalizing gait patterning.   Pt reports she is still descending stairs sideways - will assess next visit.  Pt will benefit  from skilled therapeutic intervention in order to improve on the following deficits: Abnormal gait;Decreased activity tolerance;Decreased balance;Difficulty walking;Pain;Decreased strength;Decreased range of motion;Increased fascial restricitons PT Treatment/Interventions: Gait training;Therapeutic activities;Therapeutic exercise;Manual techniques;Patient/family education PT Plan: Progress functional mobility skills, with ascending/descending stairs stating on 4" steps.     Goals PT Short Term Goals PT Short Term Goal 1: Pain level to be no more than a 5/10 80 % of the day PT Short Term Goal 1 - Progress: Progressing toward goal PT Short Term Goal 2: Improve ROM by 5 degrees to allow more normalized gt PT Short Term Goal 2 - Progress: Progressing toward goal PT Short Term Goal 3: Pt to be able to sit for an hour with comfort PT Short Term Goal 3 - Progress: Met  Problem List Patient Active Problem List   Diagnosis Date Noted  . Stiffness of joint, not elsewhere classified, ankle and foot 09/07/2014  . Pain in joint, ankle and foot 09/07/2014  . Difficulty in walking(719.7) 09/07/2014  . 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 Hematochezia 08/11/2013  . Constipation 08/11/2013  . RHEUMATOID ARTHRITIS 02/20/2010  . KNEE PAIN 02/20/2010  . FIBROMYALGIA 02/20/2010    PT - End of Session Activity Tolerance: Patient tolerated treatment well General Behavior During Therapy: [facility] for tasks assessed/performed   Shakemia Madera 09/27/2014, 3:21 PM

## 2014-09-29 ENCOUNTER — Ambulatory Visit (HOSPITAL_COMMUNITY)
Admission: RE | Admit: 2014-09-29 | Discharge: 2014-09-29 | Disposition: A | Discharge status: Home / Self Care | Payer: BC Managed Care – PPO | Source: Ambulatory Visit | Attending: Internal Medicine | Admitting: Internal Medicine

## 2014-09-29 DIAGNOSIS — M25671 Stiffness of right ankle, not elsewhere classified: Secondary | ICD-10-CM | POA: Diagnosis not present

## 2014-09-29 NOTE — Progress Notes (Signed)
Physical Therapy Treatment Patient Details  Name: [name] MRN: [id number] Date of Birth: 08/19/1966  Today's Date: 09/29/2014 Time: 8101-7510 PT Time Calculation (min): 2 min TE 2585-2778  Visit#: 7 of 8  Re-eval: 10/07/14 Assessment Diagnosis: ORIF Rt ankle Surgical Date: 08/09/14 Next MD Visit: Lorin Mercy 10/14/14  Subjective: Symptoms/Limitations Symptoms: Pt reports complaints of pain in the Rt ankle at 3/10 after standing/walking a lot today at a funeral.  How long can you stand comfortably?: Pt reports she was able to stand for 15 minutes prior to onset of pain in the Rt ankle. (was 5-10 minutes) Pain Assessment Currently in Pain?: Yes Pain Score: 3  Pain Location: Ankle Pain Orientation: Right Pain Type: Chronic pain   Exercise/Treatments Ankle Stretches Gastroc Stretch: 3 reps;30 seconds;Limitations Gastroc Stretch Limitations: Slantboard Other Stretch: HS Stretch, 18" Box 30" x2 Ankle Exercises - Standing Rocker Board: Limitations Rocker Board Limitations: x10 F/B, R/L Heel Raises: 10 reps;Limitations Heel Raises Limitations: x10 on floor, x10 on airex Toe Raise: 10 reps;Limitations Toe Raise Limitations: x10 on floor, x10 on airex Other Standing Ankle Exercises: 4" Stairs, step through ascend, step to descend x2 Ankle Exercises - Seated Ankle Circles/Pumps: 10 reps;Limitations Ankle Circles/Pumps Limitations: 1 1/2#  Other Seated Ankle Exercises: Ankle 4 Way, GTB DF/PF and RTB IN/EV x10 each   Physical Therapy Assessment and Plan PT Assessment and Plan Clinical Impression Statement: pt reports increased complaints of pain today after standing/walking on uneven terrain of grass for a funeral; limitations on standing exercises due to pain today. Pt does report improved standing tolerance to 15 minutes today.  Initiated stair training on 4" steps as pt reports she has previously been descending stairs sideways.  Pt was able to descend stairs with right and  left leading with step to gait, though pain did increase leading with Lt LE (increased DF on the Rt foot); educated pt to descend stairs forward (instead of sideways) leading with Rt foot to decrease pain and improve normaliziation of gait pattern.  Educated pt on desensitization techniques to continue at home, as pt reports increase sensitivity to medial ankle (encouraged pt to use deep and light touch, and difference textures of silk, cotton, or rough washcloth with increasing time tolerance at home).    Pt will benefit from skilled therapeutic intervention in order to improve on the following deficits: Abnormal gait;Decreased activity tolerance;Decreased balance;Difficulty walking;Pain;Decreased strength;Decreased range of motion;Increased fascial restricitons PT Plan: Re-Eval next visit.  Review densensitivation techniques. Progress functional mobility skills progressing CKC exercises to tolerance.     Goals PT Short Term Goals PT Short Term Goal 1: Pain level to be no more than a 5/10 80 % of the day PT Short Term Goal 1 - Progress: Progressing toward goal PT Short Term Goal 2: Improve ROM by 5 degrees to allow more normalized gt PT Short Term Goal 2 - Progress: Progressing toward goal PT Short Term Goal 4: Pt to be able to stand for 20 minutes to make a small meal  (Pt reports improved standing tolerance to 15 minutes today. ) PT Short Term Goal 4 - Progress: Progressing toward goal PT Long Term Goals Long Term Goal 3: Pt to be able to sit for two hours to allow traveling (Pt reports she is able to sit for unlimited periods of time (rubbing ankle as needed) on good days, not always on bad days. ) Long Term Goal 3 Progress: Partly met  Problem List Patient Active Problem List   Diagnosis Date Noted  .  Stiffness of joint, not elsewhere classified, ankle and foot 09/07/2014  . Pain in joint, ankle and foot 09/07/2014  . Difficulty in walking(719.7) 09/07/2014  . Right ankle instability  08/09/2014  . Hematochezia 08/11/2013  . Constipation 08/11/2013  . RHEUMATOID ARTHRITIS 02/20/2010  . KNEE PAIN 02/20/2010  . FIBROMYALGIA 02/20/2010    PT - End of Session Activity Tolerance: Patient limited by pain General Behavior During Therapy: [facility] for tasks assessed/performed   Liyana Suniga 09/29/2014, 2:43 PM

## 2014-10-01 ENCOUNTER — Ambulatory Visit (HOSPITAL_COMMUNITY)
Admission: RE | Admit: 2014-10-01 | Discharge: 2014-10-01 | Disposition: A | Discharge status: Home / Self Care | Payer: BC Managed Care – PPO | Source: Ambulatory Visit | Attending: Orthopaedic Surgery | Admitting: Orthopaedic Surgery

## 2014-10-01 DIAGNOSIS — M25671 Stiffness of right ankle, not elsewhere classified: Secondary | ICD-10-CM | POA: Diagnosis not present

## 2014-10-01 DIAGNOSIS — M25571 Pain in right ankle and joints of right foot: Secondary | ICD-10-CM

## 2014-10-01 NOTE — Progress Notes (Signed)
Physical Therapy Re-evaluation/ Treatment Note  Patient Details  Name: [name] MRN: [id number] Date of Birth: [birth year]  Today's Date: 10/01/2014 Time: 2229-7989 PT Time Calculation (min): 42 min Charge: TE 1530-1555, ROM/MMT 1555-1600, Manual 1600-1610, FOTO (no charge) [phone]              Visit#: 8 of 16  Re-eval: 10/29/14 Assessment Diagnosis: ORIF Rt ankle Surgical Date: 08/09/14 Next MD Visit: Lorin Mercy 10/14/14 Prior Therapy: none  Authorization: [employer]    Authorization Time Period:    Authorization Visit#:   of     Subjective Symptoms/Limitations Symptoms: Pt stated she has been sitting in meeting with work a lot today, pain scale 0/10.  Compliant with HEP in bed  stretches and exercises and has started rubbing different clothes on ankle.  Pain scale range 0-8/10. with standing How long can you sit comfortably?: Pt able to sit for unlimited time. (was 5-10 minutes with foot propped) How long can you stand comfortably?: Pt reports she was able to stand for 15 minutes prior to onset of pain in the Rt ankle. (was 5-10 minutes) How long can you walk comfortably?: Able to walk comfortably for without crutches for 30 minutes, able to tolerance for 2 hours with work (with crutches pt is able to walk for about  30 minutes but without crutches she has not walked greater than five minutes.   ) Pain Assessment Currently in Pain?: No/denies  Sensation/Coordination/Flexibility/Functional Tests Functional Tests Functional Tests: FOTO 37% (foto was 45)  Assessment RLE AROM (degrees) Right Ankle Dorsiflexion: 8 (was 0) Right Ankle Plantar Flexion: 50 (was 40) Right Ankle Inversion: 32 (was 20) Right Ankle Eversion: 20 (was 10) RLE Strength Right Ankle Dorsiflexion:  (4+/5 was 4/5) Right Ankle Plantar Flexion: 4/5 (was 3/5) Right Ankle Inversion: 3+/5 (was 3/5) Right Ankle Eversion: 3+/5 (was 3-/5)  Exercise/Treatments Ankle Stretches Gastroc Stretch: 3 reps;30  seconds;Limitations Gastroc Stretch Limitations: Slantboard Ankle Exercises - Standing Heel Raises: 10 reps;Limitations Toe Raise: 10 reps;Limitations Other Standing Ankle Exercises: stair training 2RT 4in ascend and descending Other Standing Ankle Exercises: 3D ankle excursiojnt Manual Therapy Manual Therapy: Myofascial release Myofascial Release: MFR to anterior tib and medial ankle region  Physical Therapy Assessment and Plan PT Assessment and Plan Clinical Impression Statement: Re-eval complete with the following findings:  Pt complaint with HEP and able to demonstrate/verbalize appropraite technique with all exercises.  Pt reports increased tolerance with standing and walking but does have antalgic gait due to reduced stance phase with gait.  ROM and strength are progressing well.  Pt unable to perform squat past 40 degress knee fleioxn due to "pulling" of medial ankle fascial restrictions.  Pt wtih increased difficulty with stairs. Noted fascial restrions medial ankle near incision with increased tenderness.  Pt encouraged to continue rubbing different material to improve sensation PT Plan: Recommend contiinuing OPPT 2x week for 4 more weeks to address ROM, gait, functioanl activites with stairs and work activities and manual to reduce fascial restrictions medial ankle to improve functional mobility and reduce pain.      Goals Home Exercise Program PT Goal: Perform Home Exercise Program - Progress: Met PT Short Term Goals PT Short Term Goal 1: Pain level to be no more than a 5/10 80 % of the day PT Short Term Goal 1 - Progress: Progressing toward goal PT Short Term Goal 2: Improve ROM by 5 degrees to allow more normalized gt PT Short Term Goal 3: Pt to be able to sit for an  hour with comfort PT Short Term Goal 3 - Progress: Met PT Short Term Goal 4: Pt to be able to stand for 20 minutes to make a small meal  PT Short Term Goal 4 - Progress:  (15 minutes) PT Short Term Goal 5: Pt to  be able to walk for 30 minutes to be able to shop PT Short Term Goal 5 - Progress: Met PT Long Term Goals PT Long Term Goal 1: Pt pain to be no greater than a 2/10 PT Long Term Goal 1 - Progress: Not met PT Long Term Goal 2: Pt ROM to be improved by 10 degrees to allow normalized gt Long Term Goal 3: Pt to be able to sit for two hours to allow traveling Long Term Goal 3 Progress: Partly met (45 minutes comfortably) Long Term Goal 4: Pt to be able to stand for 30 minutes to be able to socialize Long Term Goal 4 Progress: Not met PT Long Term Goal 5: Pt to be able to walk for an hour to return to work duties  Long Term Goal 5 Progress: Partly met  Problem List Patient Active Problem List   Diagnosis Date Noted  . Stiffness of joint, not elsewhere classified, ankle and foot 09/07/2014  . Pain in joint, ankle and foot 09/07/2014  . Difficulty in walking(719.7) 09/07/2014  . 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 Hematochezia 08/11/2013  . Constipation 08/11/2013  . RHEUMATOID ARTHRITIS 02/20/2010  . KNEE PAIN 02/20/2010  . FIBROMYALGIA 02/20/2010    PT - End of Session Activity Tolerance: Patient tolerated treatment well General Behavior During Therapy: [facility] for tasks assessed/performed  GP    Aldona Lento 10/01/2014, 4:51 PM  Physician Documentation Your signature is required to indicate approval of the treatment plan as stated above.  Please sign and either send electronically or make a copy of this report for your files and return this physician signed original.   Please mark one 1.__approve of plan  2. ___approve of plan with the following conditions.   ______________________________                                                          _____________________ Physician Signature                                                                                                             Date

## 2014-10-05 ENCOUNTER — Ambulatory Visit (HOSPITAL_COMMUNITY): Admission: RE | Admit: 2014-10-05 | Payer: BC Managed Care – PPO | Source: Ambulatory Visit

## 2014-10-05 NOTE — Progress Notes (Signed)
Physical Therapy Re-evaluation/ Treatment Note  Patient Details  Name: [name] MRN: [id number] Date of Birth: [birth year]  Today's Date: 10/01/2014 Time: 2229-7989 PT Time Calculation (min): 42 min Charge: TE 1530-1555, ROM/MMT 1555-1600, Manual 1600-1610, FOTO (no charge) [phone]              Visit#: 8 of 16  Re-eval: 10/29/14 Assessment Diagnosis: ORIF Rt ankle Surgical Date: 08/09/14 Next MD Visit: Lorin Mercy 10/14/14 Prior Therapy: none  Authorization: [employer]    Authorization Time Period:    Authorization Visit#:   of     Subjective Symptoms/Limitations Symptoms: Pt stated she has been sitting in meeting with work a lot today, pain scale 0/10.  Compliant with HEP in bed  stretches and exercises and has started rubbing different clothes on ankle.  Pain scale range 0-8/10. with standing How long can you sit comfortably?: Pt able to sit for unlimited time. (was 5-10 minutes with foot propped) How long can you stand comfortably?: Pt reports she was able to stand for 15 minutes prior to onset of pain in the Rt ankle. (was 5-10 minutes) How long can you walk comfortably?: Able to walk comfortably for without crutches for 30 minutes, able to tolerance for 2 hours with work (with crutches pt is able to walk for about  30 minutes but without crutches she has not walked greater than five minutes.   ) Pain Assessment Currently in Pain?: No/denies  Sensation/Coordination/Flexibility/Functional Tests Functional Tests Functional Tests: FOTO 37% (foto was 45)  Assessment RLE AROM (degrees) Right Ankle Dorsiflexion: 8 (was 0) Right Ankle Plantar Flexion: 50 (was 40) Right Ankle Inversion: 32 (was 20) Right Ankle Eversion: 20 (was 10) RLE Strength Right Ankle Dorsiflexion:  (4+/5 was 4/5) Right Ankle Plantar Flexion: 4/5 (was 3/5) Right Ankle Inversion: 3+/5 (was 3/5) Right Ankle Eversion: 3+/5 (was 3-/5)  Exercise/Treatments Ankle Stretches Gastroc Stretch: 3 reps;30  seconds;Limitations Gastroc Stretch Limitations: Slantboard Ankle Exercises - Standing Heel Raises: 10 reps;Limitations Toe Raise: 10 reps;Limitations Other Standing Ankle Exercises: stair training 2RT 4in ascend and descending Other Standing Ankle Exercises: 3D ankle excursiojnt Manual Therapy Manual Therapy: Myofascial release Myofascial Release: MFR to anterior tib and medial ankle region  Physical Therapy Assessment and Plan PT Assessment and Plan Clinical Impression Statement: Re-eval complete with the following findings:  Pt complaint with HEP and able to demonstrate/verbalize appropraite technique with all exercises.  Pt reports increased tolerance with standing and walking but does have antalgic gait due to reduced stance phase with gait.  ROM and strength are progressing well.  Pt unable to perform squat past 40 degress knee fleioxn due to "pulling" of medial ankle fascial restrictions.  Pt wtih increased difficulty with stairs. Noted fascial restrions medial ankle near incision with increased tenderness.  Pt encouraged to continue rubbing different material to improve sensation PT Plan: Recommend contiinuing OPPT 2x week for 4 more weeks to address ROM, gait, functioanl activites with stairs and work activities and manual to reduce fascial restrictions medial ankle to improve functional mobility and reduce pain.      Goals Home Exercise Program PT Goal: Perform Home Exercise Program - Progress: Met PT Short Term Goals PT Short Term Goal 1: Pain level to be no more than a 5/10 80 % of the day PT Short Term Goal 1 - Progress: Progressing toward goal PT Short Term Goal 2: Improve ROM by 5 degrees to allow more normalized gt PT Short Term Goal 3: Pt to be able to sit for an  hour with comfort PT Short Term Goal 3 - Progress: Met PT Short Term Goal 4: Pt to be able to stand for 20 minutes to make a small meal  PT Short Term Goal 4 - Progress:  (15 minutes) PT Short Term Goal 5: Pt to  be able to walk for 30 minutes to be able to shop PT Short Term Goal 5 - Progress: Met PT Long Term Goals PT Long Term Goal 1: Pt pain to be no greater than a 2/10 PT Long Term Goal 1 - Progress: Not met PT Long Term Goal 2: Pt ROM to be improved by 10 degrees to allow normalized gt Long Term Goal 3: Pt to be able to sit for two hours to allow traveling Long Term Goal 3 Progress: Partly met (45 minutes comfortably) Long Term Goal 4: Pt to be able to stand for 30 minutes to be able to socialize Long Term Goal 4 Progress: Not met PT Long Term Goal 5: Pt to be able to walk for an hour to return to work duties  Long Term Goal 5 Progress: Partly met  Problem List Patient Active Problem List   Diagnosis Date Noted  . Stiffness of joint, not elsewhere classified, ankle and foot 09/07/2014  . Pain in joint, ankle and foot 09/07/2014  . Difficulty in walking(719.7) 09/07/2014  . 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 Hematochezia 08/11/2013  . Constipation 08/11/2013  . RHEUMATOID ARTHRITIS 02/20/2010  . KNEE PAIN 02/20/2010  . FIBROMYALGIA 02/20/2010    PT - End of Session Activity Tolerance: Patient tolerated treatment well General Behavior During Therapy: [facility] for tasks assessed/performed  GP    Aldona Lento 10/01/2014, 4:51 PM  Devona Konig PT DPT  Physician Documentation Your signature is required to indicate approval of the treatment plan as stated above.  Please sign and either send electronically or make a copy of this report for your files and return this physician signed original.   Please mark one 1.__approve of plan  2. ___approve of plan with the following conditions.   ______________________________                                                          _____________________ Physician Signature                                                                                                             Date

## 2014-10-06 ENCOUNTER — Telehealth (HOSPITAL_COMMUNITY): Payer: Self-pay

## 2014-10-06 ENCOUNTER — Ambulatory Visit (HOSPITAL_COMMUNITY): Admission: RE | Admit: 2014-10-06 | Payer: BC Managed Care – PPO | Source: Ambulatory Visit

## 2014-10-06 NOTE — Telephone Encounter (Signed)
Left a message to cancel but no reason given

## 2014-10-08 ENCOUNTER — Ambulatory Visit (HOSPITAL_COMMUNITY)
Admission: RE | Admit: 2014-10-08 | Discharge: 2014-10-08 | Disposition: A | Discharge status: Home / Self Care | Payer: BC Managed Care – PPO | Source: Ambulatory Visit | Attending: Internal Medicine | Admitting: Internal Medicine

## 2014-10-08 DIAGNOSIS — M25671 Stiffness of right ankle, not elsewhere classified: Secondary | ICD-10-CM | POA: Diagnosis not present

## 2014-10-08 DIAGNOSIS — M25571 Pain in right ankle and joints of right foot: Secondary | ICD-10-CM

## 2014-10-08 NOTE — Progress Notes (Signed)
Physical Therapy Treatment Patient Details  Name: [name] MRN: [id number] Date of Birth: December 29, 1965  Today's Date: 10/08/2014 Time: 7414-2395 PT Time Calculation (min): 50 min There ex 3202-3343; manual [phone], self care [phone]  Visit#: 9 of 16  Re-eval: 10/29/14    Authorization: [employer]  Subjective: Symptoms/Limitations Symptoms: Pt states that she has not been able to sit at work and her pain and swelling  is up because of it.   Pain Assessment Currently in Pain?: Yes Pain Score: 7  Pain Location: Ankle Pain Orientation: Right;Medial Pain Type: Chronic pain      Exercise/Treatments BAPS: Level 3;Standing;Limitations BAPS Limitations: Dorsi/plantar x 10  Rocker Board: 2 minutes Rocker Board Limitations: both Ant/Post and Rt/Lt  Other Standing Ankle Exercises: working on heel toe gait. Manual Therapy Manual Therapy: Other (comment) Myofascial Release: MFR to medial ankle as well as contract relax to increase DF.    Laser treatment to medial aspect of ankle 4 treatments setting on chronic bone pain 1:45" at 75 J/cm2  Physical Therapy Assessment and Plan PT Assessment and Plan Clinical Impression Statement: Pt states that she has been working 8 hour days and she is not able to sit down which has increased her pain level.  Pt states that her ankle was very swollen yesterday and she had to ice for two hours.  After rockerboard and just starting BAPS pt stated that he pain was now a 10/10.  Therapist explained that a 10/10 would be equal to an amputation on the  war field without anethesia pt stated that his was how much pain she was having.  Therapist stopped exercises to complete manual and laser treatment. Pt was told to continue strengtheing where she is on B LE but not on Rt only over the weekend   PT Plan: continue with strengthening and pain control techniques.   Assess if Laser seemed to decrease pain.      Problem List Patient Active Problem List   Diagnosis Date Noted  . Stiffness of joint, not elsewhere classified, ankle and foot 09/07/2014  . Pain in joint, ankle and foot 09/07/2014  . Difficulty in walking(719.7) 09/07/2014  . 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 Hematochezia 08/11/2013  . Constipation 08/11/2013  . RHEUMATOID ARTHRITIS 02/20/2010  . KNEE PAIN 02/20/2010  . FIBROMYALGIA 02/20/2010       GP    Robynn Marcel,CINDY 10/08/2014, 2:38 PM

## 2014-10-11 ENCOUNTER — Encounter (HOSPITAL_COMMUNITY): Payer: Self-pay | Admitting: Emergency Medicine

## 2014-10-11 ENCOUNTER — Ambulatory Visit (HOSPITAL_COMMUNITY)
Admission: RE | Admit: 2014-10-11 | Discharge: 2014-10-11 | Disposition: A | Discharge status: Home / Self Care | Payer: BC Managed Care – PPO | Source: Ambulatory Visit | Attending: Orthopaedic Surgery | Admitting: Orthopaedic Surgery

## 2014-10-11 DIAGNOSIS — R262 Difficulty in walking, not elsewhere classified: Secondary | ICD-10-CM | POA: Diagnosis not present

## 2014-10-11 DIAGNOSIS — Z5189 Encounter for other specified aftercare: Secondary | ICD-10-CM | POA: Insufficient documentation

## 2014-10-11 DIAGNOSIS — M25571 Pain in right ankle and joints of right foot: Secondary | ICD-10-CM | POA: Diagnosis not present

## 2014-10-11 DIAGNOSIS — M25671 Stiffness of right ankle, not elsewhere classified: Secondary | ICD-10-CM | POA: Diagnosis not present

## 2014-10-11 NOTE — Therapy (Signed)
Physical Therapy Treatment  Patient Details  Name: [name] MRN: [id number] Date of Birth: 1966-08-21  Encounter Date: 10/11/2014      PT End of Session - 10/11/14 1500    Visit Number 10   Number of Visits 16   Date for PT Re-Evaluation 10/29/14   PT Start Time 8937   PT Stop Time 1430   PT Time Calculation (min) 42 min   PT Charge Details TE [id number], Laser (no charge) 1426-1430   Activity Tolerance Patient tolerated treatment well      Past Medical History  Diagnosis Date  . Headache(784.0)     tension/migraines  . GERD (gastroesophageal reflux disease)     TUMS as needed  . Asthma     triggered by weather changes; no inhaler  . 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   . History of uterine cancer   . Stenosing tenosynovitis of finger 12/2012    left middle finger  . Ganglion cyst of wrist 12/2012    left dorsal cyst  . PONV (postoperative nausea and vomiting)     also states stopped breathing after hernia surgery    Past Surgical History  Procedure Laterality Date  . Pubovaginal sling  2010  . Vagina reconstruction surgery      x 5 since 2010  . Abdominal hysterectomy  1994  . Ectopic pregnancy surgery  1995    left tube and ovary removed  . Appendectomy  1996  . Tubal ligation  1992  . Salpingoophorectomy  1996    right  . Ventral hernia repair  09/06/2006  . Cystoscopy with hydrodistension and biopsy  04/01/2007  . Trigger finger release  12/17/2012    Procedure: RELEASE TRIGGER FINGER/A-1 PULLEY;  Surgeon: Wynonia Sours, MD;  Location: [place];  Service: Orthopedics;  Laterality: Left;  . Ganglion cyst excision  12/17/2012    Procedure: REMOVAL GANGLION OF WRIST;  Surgeon: Wynonia Sours, MD;  Location: [place];  Service: Orthopedics;  Laterality: Left;  . Colonoscopy N/A 05/08/2013    Procedure: COLONOSCOPY;  Surgeon: Rogene Houston, MD;  [facility];  Service: Endoscopy;  Laterality: N/A;  240  . Hardware  removal Right 08/09/2014    Procedure: Placement Right Ankle Tight Rope, Removal Plate and Screws;  Surgeon: Marybelle Killings, MD;  Location: [place];  Service: Orthopedics;  Laterality: Right;    There were no vitals taken for this visit.  Visit Diagnosis:  Pain in joint, ankle and foot, right   Subjective: Pt stated she walked into a hole over weekend and was unable to walk the rest of weekend.  Currently pain free.  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 Pain scale 0/10       Adult PT Treatment/Exercise - 10/11/14 1457    Functional Squat 5 reps   Soleus Stretch 2 reps;30 seconds;Limitations   Soleus Stretch Limitations slant board   Gastroc Stretch 3 reps;30 seconds;Limitations   Gastroc Stretch Limitations Slantboard   BAPS Level 3;Standing;Limitations   BAPS Limitations Dorsi/plantar x 10    Rocker Board 2 minutes   Rocker Board Limitations both Ant/Post and Rt/Lt    Heel Raises 10 reps;Limitations   Toe Raise 10 reps;Limitations   Other Standing Ankle Exercises stair training 2RT 7in ascend and 4in descending   Other Standing Ankle Exercises working on heel toe gait.  PT Short Term Goals - 10/11/14 1505    Title Pain level to be no more than a 5/10 80 % of the day   Status On-going   Title Improve ROM by 5 degrees to allow more normalized gt   Title Pt to be able to sit for an hour with comfort   Status Achieved   Title Pt to be able to stand for 20 minutes to make a small meal     Status On-going   Title Pt to be able to walk for 30 minutes to be able to shop   Status Achieved          PT Long Term Goals - 10/11/14 1513    Title Pt pain to be no greater than a 2/10   Status On-going   Title Pt ROM to be improved by 10 degrees to allow normalized gt   Title Pt to be able to sit for two hours to allow traveling   Title Pt to be able to stand for 30 minutes to be able to socialize          Plan -  10/11/14 1501    Clinical Impression Statement Session focus on ankle mobility and gait training to improve AROM and normalize gait mechanics.  Added soleus stretches to improve flexibilty.  Noted improved knee flexion following stretches.  Pt with ability to complete squats today with HHA.  Continued with laser for pain control and encouraged pt to continue working on sensations contact.     PT Plan continue with strengthening and pain control techniques.         Problem List Patient Active Problem List   Diagnosis Date Noted  . Stiffness of joint, not elsewhere classified, ankle and foot 09/07/2014  . Pain in joint, ankle and foot 09/07/2014  . Difficulty in walking(719.7) 09/07/2014  . 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 Hematochezia 08/11/2013  . Constipation 08/11/2013  . RHEUMATOID ARTHRITIS 02/20/2010  . KNEE PAIN 02/20/2010  . FIBROMYALGIA 02/20/2010    Aldona Lento, PTA Aldona Lento 10/11/2014, 3:17 PM

## 2014-10-13 ENCOUNTER — Encounter (HOSPITAL_COMMUNITY): Payer: Self-pay | Admitting: Physical Therapy

## 2014-10-13 ENCOUNTER — Ambulatory Visit (HOSPITAL_COMMUNITY)
Admission: RE | Admit: 2014-10-13 | Discharge: 2014-10-13 | Disposition: A | Discharge status: Home / Self Care | Payer: BC Managed Care – PPO | Source: Ambulatory Visit | Attending: Internal Medicine | Admitting: Internal Medicine

## 2014-10-13 DIAGNOSIS — M25671 Stiffness of right ankle, not elsewhere classified: Secondary | ICD-10-CM

## 2014-10-13 DIAGNOSIS — M25571 Pain in right ankle and joints of right foot: Secondary | ICD-10-CM

## 2014-10-13 DIAGNOSIS — Z5189 Encounter for other specified aftercare: Secondary | ICD-10-CM | POA: Diagnosis not present

## 2014-10-13 DIAGNOSIS — R262 Difficulty in walking, not elsewhere classified: Secondary | ICD-10-CM

## 2014-10-13 NOTE — Therapy (Addendum)
Physical Therapy Treatment/Reassessment  Patient Details  Name: [name] MRN: [id number] Date of Birth: [birth year]  Encounter Date: 10/13/2014    Past Medical History  Diagnosis Date  . Headache(784.0)     tension/migraines  . GERD (gastroesophageal reflux disease)     TUMS as needed  . Asthma     triggered by weather changes; no inhaler  . 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   . History of uterine cancer   . Stenosing tenosynovitis of finger 12/2012    left middle finger  . Ganglion cyst of wrist 12/2012    left dorsal cyst  . PONV (postoperative nausea and vomiting)     also states stopped breathing after hernia surgery    Past Surgical History  Procedure Laterality Date  . Pubovaginal sling  2010  . Vagina reconstruction surgery      x 5 since 2010  . Abdominal hysterectomy  1994  . Ectopic pregnancy surgery  1995    left tube and ovary removed  . Appendectomy  1996  . Tubal ligation  1992  . Salpingoophorectomy  1996    right  . Ventral hernia repair  09/06/2006  . Cystoscopy with hydrodistension and biopsy  04/01/2007  . Trigger finger release  12/17/2012    Procedure: RELEASE TRIGGER FINGER/A-1 PULLEY;  Surgeon: Wynonia Sours, MD;  Location: [place];  Service: Orthopedics;  Laterality: Left;  . Ganglion cyst excision  12/17/2012    Procedure: REMOVAL GANGLION OF WRIST;  Surgeon: Wynonia Sours, MD;  Location: [place];  Service: Orthopedics;  Laterality: Left;  . Colonoscopy N/A 05/08/2013    Procedure: COLONOSCOPY;  Surgeon: Rogene Houston, MD;  [facility];  Service: Endoscopy;  Laterality: N/A;  240  . Hardware removal Right 08/09/2014    Procedure: Placement Right Ankle Tight Rope, Removal Plate and Screws;  Surgeon: Marybelle Killings, MD;  Location: [place];  Service: Orthopedics;  Laterality: Right;    There were no vitals taken for this visit.  Visit Diagnosis:  Pain in joint, ankle and foot, right  Difficulty  walking  Stiffness of ankle joint, right  Pain is about a 6/10        Adult PT Treatment/Exercise - 10/13/14 1357    Exercises   Exercises Ankle   Ankle Exercises: Stretches   Soleus Stretch 3 reps;30 seconds   Gastroc Stretch 3 reps;30 seconds   Gastroc Stretch Limitations Slantboard   Ankle Exercises: Machines for Strengthening   Cybex Leg Press 10 rep Pl 3   Ankle Exercises: Standing   BAPS Level 3;Standing;Limitations   BAPS Limitations Dorsi/plantar x 10    SLS 5x on foam   Rocker Board 2 minutes   Rocker Board Limitations both Ant/Post and Rt/Lt    Heel Walk (Round Trip) 1 RT-82ft   Toe Walk (Round Trip) 1 RT-30 ft    Other Standing Ankle Exercises stair training 2RT 7in ascend and 4in descending     PT AROM:  Dorsiflexion:       8 degrees was 0    Stength: 4+/5   was 4/5                     Plantarflexion:   50 was 40                                5/5  was 3/5                     Inversion:          22 was 20                                3+/5  was 3/5                     Eversion:          20 was 10                                 5-/5 was 3-/5      PT Short Term Goals - 10/13/14 1441    PT SHORT TERM GOAL #1   Title Pain level to be no more than a 5/10 80 % of the day   Time 2   Period Weeks   Status Achieved   PT SHORT TERM GOAL #2   Title Improve ROM by 5 degrees to allow more normalized gt   Time 2   Period Weeks   Status Achieved   PT SHORT TERM GOAL #3   Title Pt to be able to sit for an hour with comfort   Time 2   Status Achieved   PT SHORT TERM GOAL #4   Title Pt to be able to stand for 20 minutes to make a small meal     Time 2   Status Achieved   PT SHORT TERM GOAL #5   Title Pt to be able to walk for 30 minutes to be able to shop   Time 2   Period Weeks          PT Long Term Goals - 10/13/14 1444    PT LONG TERM GOAL #1   Title Pt pain to be no greater than a 2/10   Time 4   Status On-going   PT LONG TERM GOAL #2   Title Pt  ROM to be improved by 10 degrees to allow normalized gt   Time 4   Status Achieved   PT LONG TERM GOAL #3   Title Pt to be able to sit for two hours to allow traveling   Time 4   Status Achieved   PT LONG TERM GOAL #4   Title Pt to be able to stand for 30 minutes to be able to socialize   Time 4   Period Weeks   Status Achieved  30 minutes is max   PT LONG TERM GOAL #5   Title Pt to be able to walk for an hour to return to work duties    Time 4   Status Achieved   Additional Long Term Goals   Additional Long Term Goals Yes   PT LONG TERM GOAL #6   Title new as of 10/13/2014- Pt to be able to be on foot for 6 hours to be able to complete job duties without increased pain   Time 8   Period Weeks   Status New   PT LONG TERM GOAL #7   Title Pt to be able to stand for an hour in order to complete work activites   Time 8   Status New          Plan - 10/13/14 1426  Clinical Impression Statement Pt with noted tight soleus mm, decreased activity tolerance as well as decreased balance.  Today treatment focused on these issues as well as general strengthening.    Pt will benefit from skilled therapeutic intervention in order to improve on the following deficits Abnormal gait;Decreased activity tolerance;Decreased balance;Decreased range of motion;Decreased strength;Difficulty walking;Pain;Increased edema   Rehab Potential Good   PT Frequency Min 2X/week   PT Duration 4 weeks  for a total of 8 weeks    PT Treatment/Interventions Gait training;Stair training;Therapeutic activities;Therapeutic exercise;Manual techniques;Patient/family education;Modalities   PT Plan continue to continue with plan of care         Problem List Patient Active Problem List   Diagnosis Date Noted  . Stiffness of joint, not elsewhere classified, ankle and foot 09/07/2014  . Pain in joint, ankle and foot 09/07/2014  . Difficulty in walking(719.7) 09/07/2014  . 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  Hematochezia 08/11/2013  . Constipation 08/11/2013  . RHEUMATOID ARTHRITIS 02/20/2010  . KNEE PAIN 02/20/2010  . FIBROMYALGIA 02/20/2010        RUSSELL,CINDY PT 10/13/2014, 2:58 PM

## 2014-10-15 ENCOUNTER — Ambulatory Visit (HOSPITAL_COMMUNITY)
Admission: RE | Admit: 2014-10-15 | Discharge: 2014-10-15 | Disposition: A | Discharge status: Home / Self Care | Payer: BC Managed Care – PPO | Source: Ambulatory Visit | Attending: Internal Medicine | Admitting: Internal Medicine

## 2014-10-15 DIAGNOSIS — R262 Difficulty in walking, not elsewhere classified: Secondary | ICD-10-CM

## 2014-10-15 DIAGNOSIS — Z5189 Encounter for other specified aftercare: Secondary | ICD-10-CM | POA: Diagnosis not present

## 2014-10-15 DIAGNOSIS — M25571 Pain in right ankle and joints of right foot: Secondary | ICD-10-CM

## 2014-10-15 DIAGNOSIS — M25671 Stiffness of right ankle, not elsewhere classified: Secondary | ICD-10-CM

## 2014-10-15 NOTE — Therapy (Signed)
Physical Therapy Treatment  Patient Details  Name: [name] MRN: [id number] Date of Birth: 05-29-1966  Encounter Date: 10/15/2014      PT End of Session - 10/15/14 1406    Visit Number 12   Number of Visits 16   Date for PT Re-Evaluation 10/29/14   PT Start Time 1350   PT Stop Time 1432   PT Time Calculation (min) 42 min   PT Charge Details TE [id number], Laser (no charge) [id number]      Past Medical History  Diagnosis Date  . Headache(784.0)     tension/migraines  . GERD (gastroesophageal reflux disease)     TUMS as needed  . Asthma     triggered by weather changes; no inhaler  . 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   . History of uterine cancer   . Stenosing tenosynovitis of finger 12/2012    left middle finger  . Ganglion cyst of wrist 12/2012    left dorsal cyst  . PONV (postoperative nausea and vomiting)     also states stopped breathing after hernia surgery    Past Surgical History  Procedure Laterality Date  . Pubovaginal sling  2010  . Vagina reconstruction surgery      x 5 since 2010  . Abdominal hysterectomy  1994  . Ectopic pregnancy surgery  1995    left tube and ovary removed  . Appendectomy  1996  . Tubal ligation  1992  . Salpingoophorectomy  1996    right  . Ventral hernia repair  09/06/2006  . Cystoscopy with hydrodistension and biopsy  04/01/2007  . Trigger finger release  12/17/2012    Procedure: RELEASE TRIGGER FINGER/A-1 PULLEY;  Surgeon: Wynonia Sours, MD;  Location: [place];  Service: Orthopedics;  Laterality: Left;  . Ganglion cyst excision  12/17/2012    Procedure: REMOVAL GANGLION OF WRIST;  Surgeon: Wynonia Sours, MD;  Location: [place];  Service: Orthopedics;  Laterality: Left;  . Colonoscopy N/A 05/08/2013    Procedure: COLONOSCOPY;  Surgeon: Rogene Houston, MD;  [facility];  Service: Endoscopy;  Laterality: N/A;  240  . Hardware removal Right 08/09/2014    Procedure: Placement Right  Ankle Tight Rope, Removal Plate and Screws;  Surgeon: Marybelle Killings, MD;  Location: [place];  Service: Orthopedics;  Laterality: Right;    There were no vitals taken for this visit.  Visit Diagnosis:  Pain in joint, ankle and foot, right  Difficulty walking  Stiffness of ankle joint, right  Subjective: Went to MD yesterday, pt stated MD stated he may have hit a nerve during surgery.  Current pain scale 1/10, feels stiff today.        [facility] Adult PT Treatment/Exercise - 10/15/14 1926    Exercises   Exercises Ankle   Knee/Hip Exercises: Standing   Functional Squat 10 reps   Stairs 5RT reciprocal pattern on 7 in step, cueing for toe push off when ascending and knee flexion descending   Other Standing Knee Exercises 3D ankle excursiotn 10x   Other Standing Knee Exercises knee drives to improve dorsi/plantar flexoin and inv/env 10x3" on 12in step   Modalities   Modalities --  Laser treatment to medial aspect of ankle 4 treatments setti   Ankle Exercises: Stretches   Gastroc Stretch 3 reps;30 seconds   Gastroc Stretch Limitations Slantboard   Ankle Exercises: Standing   BAPS Level 3;Standing;Limitations   BAPS Limitations Dorsi/plantar, inv/env; CW/CCW x 10  Other Standing Ankle Exercises stair training ascend and descend 7in step           Education - 10/15/14 1928    Education provided Yes   Education Details Discussed beneftis with compression hose and techniques for edema control   Methods Explanation   Comprehension Verbalized understanding          PT Short Term Goals - 10/15/14 1406    PT SHORT TERM GOAL #1   Title Pain level to be no more than a 5/10 80 % of the day   Status Achieved   PT SHORT TERM GOAL #2   Title Improve ROM by 5 degrees to allow more normalized gt   Status Achieved   PT SHORT TERM GOAL #3   Title Pt to be able to sit for an hour with comfort   Status Achieved   PT SHORT TERM GOAL #4   Title Pt to be able to stand for 20 minutes to make a  small meal     Status Achieved   PT SHORT TERM GOAL #5   Title Pt to be able to walk for 30 minutes to be able to shop   Status Achieved          PT Long Term Goals - 10/15/14 1407    PT LONG TERM GOAL #1   Title Pt pain to be no greater than a 2/10   Status On-going   PT LONG TERM GOAL #2   Title Pt ROM to be improved by 10 degrees to allow normalized gt   PT LONG TERM GOAL #3   Title Pt to be able to sit for two hours to allow traveling   PT LONG TERM GOAL #4   Title Pt to be able to stand for 30 minutes to be able to socialize   PT LONG TERM GOAL #5   Title Pt to be able to walk for an hour to return to work duties    PT [place] #6   Title new as of 10/13/2014- Pt to be able to be on foot for 6 hours to be able to complete job duties without increased pain   Status On-going   PT LONG TERM GOAL #7   Title Pt to be able to stand for an hour in order to complete work activites   Status On-going          Plan - 10/15/14 1928    Clinical Impression Statement Pt with increased swelling lateral aspect of ankle from extended weight bearing.  Session focus on improving ankle dorsiflexion and inversion/eversion to improve gait mechanics with functional motions.  Added knee drive exercises to increase AROM with proper technique complete following demonstration.  Ended session with laser for pain control.     PT Plan Continue to progress pt working on improving functional motion         Problem List Patient Active Problem List   Diagnosis Date Noted  . Stiffness of joint, not elsewhere classified, ankle and foot 09/07/2014  . Pain in joint, ankle and foot 09/07/2014  . Difficulty in walking(719.7) 09/07/2014  . 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 Hematochezia 08/11/2013  . Constipation 08/11/2013  . RHEUMATOID ARTHRITIS 02/20/2010  . KNEE PAIN 02/20/2010  . FIBROMYALGIA 02/20/2010   Aldona Lento, PTA Aldona Lento 10/15/2014, 7:34 PM

## 2014-10-18 ENCOUNTER — Ambulatory Visit (HOSPITAL_COMMUNITY): Payer: BC Managed Care – PPO

## 2014-10-20 ENCOUNTER — Ambulatory Visit (HOSPITAL_COMMUNITY)
Admission: RE | Admit: 2014-10-20 | Discharge: 2014-10-20 | Disposition: A | Discharge status: Home / Self Care | Payer: BC Managed Care – PPO | Source: Ambulatory Visit | Attending: Internal Medicine | Admitting: Internal Medicine

## 2014-10-20 DIAGNOSIS — M25671 Stiffness of right ankle, not elsewhere classified: Secondary | ICD-10-CM

## 2014-10-20 DIAGNOSIS — M25571 Pain in right ankle and joints of right foot: Secondary | ICD-10-CM

## 2014-10-20 DIAGNOSIS — R262 Difficulty in walking, not elsewhere classified: Secondary | ICD-10-CM

## 2014-10-20 DIAGNOSIS — Z5189 Encounter for other specified aftercare: Secondary | ICD-10-CM | POA: Diagnosis not present

## 2014-10-20 NOTE — Therapy (Addendum)
Physical Therapy Treatment  Patient Details  Name: [name] MRN: [id number] Date of Birth: [birth year]  Encounter Date: 10/20/2014      PT End of Session - 10/20/14 1433    Visit Number 13   Number of Visits 16   Date for PT Re-Evaluation 10/29/14   Activity Tolerance Patient tolerated treatment well      Past Medical History  Diagnosis Date  . Headache(784.0)     tension/migraines  . GERD (gastroesophageal reflux disease)     TUMS as needed  . Asthma     triggered by weather changes; no inhaler  . 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   . History of uterine cancer   . Stenosing tenosynovitis of finger 12/2012    left middle finger  . Ganglion cyst of wrist 12/2012    left dorsal cyst  . PONV (postoperative nausea and vomiting)     also states stopped breathing after hernia surgery    Past Surgical History  Procedure Laterality Date  . Pubovaginal sling  2010  . Vagina reconstruction surgery      x 5 since 2010  . Abdominal hysterectomy  1994  . Ectopic pregnancy surgery  1995    left tube and ovary removed  . Appendectomy  1996  . Tubal ligation  1992  . Salpingoophorectomy  1996    right  . Ventral hernia repair  09/06/2006  . Cystoscopy with hydrodistension and biopsy  04/01/2007  . Trigger finger release  12/17/2012    Procedure: RELEASE TRIGGER FINGER/A-1 PULLEY;  Surgeon: Wynonia Sours, MD;  Location: [place];  Service: Orthopedics;  Laterality: Left;  . Ganglion cyst excision  12/17/2012    Procedure: REMOVAL GANGLION OF WRIST;  Surgeon: Wynonia Sours, MD;  Location: [place];  Service: Orthopedics;  Laterality: Left;  . Colonoscopy N/A 05/08/2013    Procedure: COLONOSCOPY;  Surgeon: Rogene Houston, MD;  [facility];  Service: Endoscopy;  Laterality: N/A;  240  . Hardware removal Right 08/09/2014    Procedure: Placement Right Ankle Tight Rope, Removal Plate and Screws;  Surgeon: Marybelle Killings, MD;  Location: [place];   Service: Orthopedics;  Laterality: Right;    There were no vitals taken for this visit.  Visit Diagnosis:  Pain in joint, ankle and foot, right  Difficulty walking  Stiffness of ankle joint, right      Subjective Assessment - 10/20/14 1349    Symptoms Pt states that she has been on her feet all day and her ankle pain is at a 1/10.  A tool box fell on her foot last night    Currently in Pain? Yes   Pain Score 1    Pain Location Ankle   Pain Orientation Right   Pain Type Chronic pain            [facility] Adult PT Treatment/Exercise - 10/20/14 1400    Exercises   Exercises Ankle   Knee/Hip Exercises: Standing   Heel Raises 10 reps;Limitations   Heel Raises Limitations up on both down on RT    Functional Squat 10 reps;Limitations   Functional Squat Limitations touch the floor    Stairs 2 Rt    Rocker Board 2 minutes   Rocker Board Limitations rt/LT as well as ant/post.   SLS 5x    Other Standing Knee Exercises baps level 3; [place] x 2 rt/ cybex leg press using ankle 3Pl x 10  Other Standing Knee Exercises knee drives to improve dorsi/plantar flexoin and inv/env 10x3" on 12in step   Modalities   Modalities --  laser for chronic pain 4x chronic mm pain and stiffness 60J/                Plan - 10/20/14 1433    Clinical Impression Statement Pt still has significant decreased balance but is improving with heel toe gait.  PT will be out of town for two weeks following this week.   PT Next Visit Plan give t-band for home strengthening        Problem List Patient Active Problem List   Diagnosis Date Noted  . Stiffness of joint, not elsewhere classified, ankle and foot 09/07/2014  . Pain in joint, ankle and foot 09/07/2014  . Difficulty in walking(719.7) 09/07/2014  . 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 Hematochezia 08/11/2013  . Constipation 08/11/2013  . RHEUMATOID ARTHRITIS 02/20/2010  . KNEE PAIN 02/20/2010  . FIBROMYALGIA 02/20/2010            Azucena Freed PT/CLT [phone]  10/20/2014, 2:35 PM

## 2014-10-22 ENCOUNTER — Ambulatory Visit (HOSPITAL_COMMUNITY)
Admission: RE | Admit: 2014-10-22 | Discharge: 2014-10-22 | Disposition: A | Discharge status: Home / Self Care | Payer: BC Managed Care – PPO | Source: Ambulatory Visit | Attending: Internal Medicine | Admitting: Internal Medicine

## 2014-10-22 DIAGNOSIS — M25671 Stiffness of right ankle, not elsewhere classified: Secondary | ICD-10-CM

## 2014-10-22 DIAGNOSIS — Z5189 Encounter for other specified aftercare: Secondary | ICD-10-CM | POA: Diagnosis not present

## 2014-10-22 DIAGNOSIS — M25571 Pain in right ankle and joints of right foot: Secondary | ICD-10-CM

## 2014-10-22 DIAGNOSIS — R262 Difficulty in walking, not elsewhere classified: Secondary | ICD-10-CM

## 2014-10-22 NOTE — Therapy (Signed)
Physical Therapy Treatment  Patient Details  Name: [name] MRN: [id number] Date of Birth: [birth year]  Encounter Date: 10/22/2014      PT End of Session - 10/22/14 1632    Date for PT Re-Evaluation 10/29/14   Authorization Type BCBS   PT Start Time (ACUTE ONLY) 1400   PT Stop Time (ACUTE ONLY) 1430   PT Time Calculation (min) (ACUTE ONLY) 30 min      Past Medical History  Diagnosis Date  . Headache(784.0)     tension/migraines  . GERD (gastroesophageal reflux disease)     TUMS as needed  . Asthma     triggered by weather changes; no inhaler  . 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   . History of uterine cancer   . Stenosing tenosynovitis of finger 12/2012    left middle finger  . Ganglion cyst of wrist 12/2012    left dorsal cyst  . PONV (postoperative nausea and vomiting)     also states stopped breathing after hernia surgery    Past Surgical History  Procedure Laterality Date  . Pubovaginal sling  2010  . Vagina reconstruction surgery      x 5 since 2010  . Abdominal hysterectomy  1994  . Ectopic pregnancy surgery  1995    left tube and ovary removed  . Appendectomy  1996  . Tubal ligation  1992  . Salpingoophorectomy  1996    right  . Ventral hernia repair  09/06/2006  . Cystoscopy with hydrodistension and biopsy  04/01/2007  . Trigger finger release  12/17/2012    Procedure: RELEASE TRIGGER FINGER/A-1 PULLEY;  Surgeon: Wynonia Sours, MD;  Location: [place];  Service: Orthopedics;  Laterality: Left;  . Ganglion cyst excision  12/17/2012    Procedure: REMOVAL GANGLION OF WRIST;  Surgeon: Wynonia Sours, MD;  Location: [place];  Service: Orthopedics;  Laterality: Left;  . Colonoscopy N/A 05/08/2013    Procedure: COLONOSCOPY;  Surgeon: Rogene Houston, MD;  [facility];  Service: Endoscopy;  Laterality: N/A;  240  . Hardware removal Right 08/09/2014    Procedure: Placement Right Ankle Tight Rope, Removal Plate and  Screws;  Surgeon: Marybelle Killings, MD;  Location: [place];  Service: Orthopedics;  Laterality: Right;    There were no vitals taken for this visit.  Visit Diagnosis:  No diagnosis found.   PT states her pain is at a 2/10 today.         [facility] Adult PT Treatment/Exercise - 10/22/14 1409    Balance Poses: Yoga   Tree Pose 2 reps;30 seconds   Exercises   Exercises Ankle   Knee/Hip Exercises: Standing   Functional Squat 10 reps   Functional Squat Limitations touch the floor    Stairs 2 Rt    SLS with Vectors 10" x 3    Other Standing Knee Exercises walking on toes x 2 RT    Ankle Exercises: Stretches   Gastroc Stretch 3 reps;30 seconds   Gastroc Stretch Limitations Slantboard   Ankle Exercises: Plyometrics   Bilateral Jumping 10 reps   Ankle Exercises: Standing   BAPS Level 3   BAPS Limitations Dorsi/plantar, inv/env; CW/CCW x 10    Ankle Exercises: Seated   Other Seated Ankle Exercises inversion 0#,1# 10x each.                Plan - 10/22/14 1632    Clinical Impression Statement  Pt arrived 13  minutes late for treatment.  Pt continues to show gain in strength and ROM but states this is not corresponding to decrease pain.  Pt states the even the MD thinks that there may be a nerve pinched in her ankle .    PT Next Visit Plan give t-band for home strengthening due to pt being late this did not happen.     Clinical Impression Statement (ACUTE ONLY)  Pt continues to have significant weakness of invertors, balance continues to be decreased but pt has been educated in exercises and should be ready for discharge with updated HEP to include higher level balancing, T-band exercises.    PT Plan Reassess next visit as pt will be out of town with work for the next two weeks.         Problem List Patient Active Problem List   Diagnosis Date Noted  . Stiffness of joint, not elsewhere classified, ankle and foot 09/07/2014  . Pain in joint, ankle and foot 09/07/2014  . Difficulty in  walking(719.7) 09/07/2014  . 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 Hematochezia 08/11/2013  . Constipation 08/11/2013  . RHEUMATOID ARTHRITIS 02/20/2010  . KNEE PAIN 02/20/2010  . FIBROMYALGIA 02/20/2010        RUSSELL,CINDY  PT 10/22/2014, 4:39 PM

## 2014-10-25 ENCOUNTER — Ambulatory Visit (HOSPITAL_COMMUNITY): Payer: BC Managed Care – PPO

## 2014-10-27 ENCOUNTER — Ambulatory Visit (HOSPITAL_COMMUNITY): Payer: BC Managed Care – PPO | Admitting: Physical Therapy

## 2014-10-27 ENCOUNTER — Telehealth (HOSPITAL_COMMUNITY): Payer: Self-pay | Admitting: Physical Therapy

## 2014-10-27 NOTE — Addendum Note (Signed)
Encounter addended by: Leeroy Cha, PT on: 10/27/2014 12:25 PM<BR>     Documentation filed: Clinical Notes

## 2014-10-27 NOTE — Addendum Note (Signed)
Encounter addended by: Leeroy Cha, PT on: 10/27/2014  2:51 PM<BR>     Documentation filed: Clinical Notes

## 2014-10-27 NOTE — Telephone Encounter (Signed)
SHe has dropped a box on her foot and can not come in today

## 2014-10-29 ENCOUNTER — Ambulatory Visit (HOSPITAL_COMMUNITY): Payer: BC Managed Care – PPO | Admitting: Physical Therapy

## 2014-11-01 ENCOUNTER — Ambulatory Visit (HOSPITAL_COMMUNITY)
Admission: RE | Admit: 2014-11-01 | Discharge: 2014-11-01 | Disposition: A | Discharge status: Home / Self Care | Payer: BC Managed Care – PPO | Source: Ambulatory Visit | Attending: Internal Medicine | Admitting: Internal Medicine

## 2014-11-01 DIAGNOSIS — M25671 Stiffness of right ankle, not elsewhere classified: Secondary | ICD-10-CM

## 2014-11-01 DIAGNOSIS — R262 Difficulty in walking, not elsewhere classified: Secondary | ICD-10-CM

## 2014-11-01 DIAGNOSIS — M25571 Pain in right ankle and joints of right foot: Secondary | ICD-10-CM

## 2014-11-01 DIAGNOSIS — Z5189 Encounter for other specified aftercare: Secondary | ICD-10-CM | POA: Diagnosis not present

## 2014-11-01 NOTE — Therapy (Signed)
Physical Therapy Re-evaluation  Patient Details  Name: [name] MRN: [id number] Date of Birth: 01/14/1966  Encounter Date: 11/01/2014      PT End of Session - 11/01/14 1437    Visit Number 15   Number of Visits 28   Date for PT Re-Evaluation 11/29/14   Authorization Type BCBS   PT Start Time 1348   PT Stop Time 1430   PT Time Calculation (min) 42 min   Activity Tolerance Patient tolerated treatment well   Behavior During Therapy [facility] for tasks assessed/performed      Past Medical History  Diagnosis Date  . Headache(784.0)     tension/migraines  . GERD (gastroesophageal reflux disease)     TUMS as needed  . Asthma     triggered by weather changes; no inhaler  . 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   . History of uterine cancer   . Stenosing tenosynovitis of finger 12/2012    left middle finger  . Ganglion cyst of wrist 12/2012    left dorsal cyst  . PONV (postoperative nausea and vomiting)     also states stopped breathing after hernia surgery    Past Surgical History  Procedure Laterality Date  . Pubovaginal sling  2010  . Vagina reconstruction surgery      x 5 since 2010  . Abdominal hysterectomy  1994  . Ectopic pregnancy surgery  1995    left tube and ovary removed  . Appendectomy  1996  . Tubal ligation  1992  . Salpingoophorectomy  1996    right  . Ventral hernia repair  09/06/2006  . Cystoscopy with hydrodistension and biopsy  04/01/2007  . Trigger finger release  12/17/2012    Procedure: RELEASE TRIGGER FINGER/A-1 PULLEY;  Surgeon: Wynonia Sours, MD;  Location: [place];  Service: Orthopedics;  Laterality: Left;  . Ganglion cyst excision  12/17/2012    Procedure: REMOVAL GANGLION OF WRIST;  Surgeon: Wynonia Sours, MD;  Location: [place];  Service: Orthopedics;  Laterality: Left;  . Colonoscopy N/A 05/08/2013    Procedure: COLONOSCOPY;  Surgeon: Rogene Houston, MD;  [facility];  Service: Endoscopy;   Laterality: N/A;  240  . Hardware removal Right 08/09/2014    Procedure: Placement Right Ankle Tight Rope, Removal Plate and Screws;  Surgeon: Marybelle Killings, MD;  Location: [place];  Service: Orthopedics;  Laterality: Right;    There were no vitals taken for this visit.  Visit Diagnosis:  Pain in joint, ankle and foot, right  Difficulty walking  Stiffness of ankle joint, right      Subjective Assessment - 11/01/14 1433    Symptoms Pt states she is now back on full duty.  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  States her pain level continues to vary and is never painfree.  Reports pain today 5/10 in Rt medial ankle area that worsens with weight bearing.   Currently in Pain? Yes   Pain Score 5    Pain Location Ankle   Pain Orientation Right;Medial   Pain Descriptors / Indicators Constant          [facility] PT Assessment - 11/01/14 1356    Assessment   Next MD Visit [facility] 10/14/14   Prior Therapy none   AROM   Right Ankle Dorsiflexion 10  was 8 degrees   Right Ankle Plantar Flexion 50  was 50 degrees   Right Ankle Inversion 32  was 32 degrees  Right Ankle Eversion 20  was 20 degrees   Strength   Right Ankle Dorsiflexion 4+/5  was 4+/5   Right Ankle Plantar Flexion 4/5  was 4/5   Right Ankle Inversion 3+/5  was 3+/5   Right Ankle Eversion 3+/5  was 3+/5             PT Short Term Goals - 11/01/14 1408    PT SHORT TERM GOAL #1   Title Pain level to be no more than a 5/10 80 % of the day   Time 2   Period Weeks   Status Not Met   PT SHORT TERM GOAL #2   Title Improve ROM by 5 degrees to allow more normalized gt   Time 2   Period Weeks   Status Achieved   PT SHORT TERM GOAL #3   Title Pt to be able to sit for an hour with comfort   Status Achieved   PT SHORT TERM GOAL #4   Title Pt to be able to stand for 20 minutes to make a small meal     Status Achieved   PT SHORT TERM GOAL #5   Title Pt to be able to walk for 30 minutes to be able to shop    Time 2   Period Weeks   Status Achieved          PT Long Term Goals - 11/01/14 1410    PT LONG TERM GOAL #1   Title Pt pain to be no greater than a 2/10   Time 4   Period Weeks   Status Not Met   PT LONG TERM GOAL #2   Title Pt ROM to be improved by 10 degrees to allow normalized gt   Time 4   Period Weeks   Status Achieved   PT LONG TERM GOAL #3   Title Pt to be able to sit for two hours to allow traveling   Time 4   Period Weeks   Status Achieved   PT LONG TERM GOAL #4   Title Pt to be able to stand for 30 minutes to be able to socialize   Time 4   Period Weeks   Status Achieved   PT LONG TERM GOAL #5   Title Pt to be able to walk for an hour to return to work duties    Time 4   Period Weeks   Status Achieved   PT LONG TERM GOAL #6   Title new as of 10/13/2014- Pt to be able to be on foot for 6 hours to be able to complete job duties without increased pain   Time 8   Period Weeks   Status On-going   PT LONG TERM GOAL #7   Title Pt to be able to stand for an hour in order to complete work activites   Time 8   Period Weeks   Status Not Met          Plan - 11/01/14 1440    Clinical Impression Statement Pt has met 1/5 STG's and 3/7 LTG's.  Major limitations include medial ankle pain and instability in ankle with single leg stance actvities.  Pt is progressing well and has returned to work, however she is finding herself shifting weight off her leg while standing at work to decrease discomfort.  Pt may benefit from iontophoresis to decrease pain in Rt ankle.  evaluating therapist and patient in aggreement.     PT Next Visit  Plan give t-band for home strengthening as forgot this visit.  Begin single leg stance actvities (hip musculature is also weak).   PT Plan Begin ionto with dexamethasone when order returned.  Recommend continuation 3 X 3-4 more weeks to progress stability in Rt LE  and decrease pain.  Begin PRE's to address weakness of invertors and evertors.          Problem List Patient Active Problem List   Diagnosis Date Noted  . Stiffness of joint, not elsewhere classified, ankle and foot 09/07/2014  . Pain in joint, ankle and foot 09/07/2014  . Difficulty in walking(719.7) 09/07/2014  . 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 Hematochezia 08/11/2013  . Constipation 08/11/2013  . RHEUMATOID ARTHRITIS 02/20/2010  . KNEE PAIN 02/20/2010  . FIBROMYALGIA 02/20/2010         Teena Irani, PTA/CLT [phone] 11/01/2014, 5:37 PM   Azucena Freed PT/CLT [phone]

## 2014-11-03 ENCOUNTER — Ambulatory Visit (HOSPITAL_COMMUNITY): Payer: BC Managed Care – PPO

## 2014-11-05 ENCOUNTER — Ambulatory Visit (HOSPITAL_COMMUNITY): Payer: BC Managed Care – PPO | Admitting: Physical Therapy

## 2014-11-08 ENCOUNTER — Ambulatory Visit (HOSPITAL_COMMUNITY)
Admission: RE | Admit: 2014-11-08 | Discharge: 2014-11-08 | Disposition: A | Discharge status: Home / Self Care | Payer: BC Managed Care – PPO | Source: Ambulatory Visit | Attending: Internal Medicine | Admitting: Internal Medicine

## 2014-11-08 ENCOUNTER — Ambulatory Visit (HOSPITAL_COMMUNITY): Payer: BC Managed Care – PPO | Admitting: Physical Therapy

## 2014-11-09 ENCOUNTER — Ambulatory Visit (HOSPITAL_COMMUNITY)
Admission: RE | Admit: 2014-11-09 | Discharge: 2014-11-09 | Disposition: A | Discharge status: Home / Self Care | Payer: BC Managed Care – PPO | Source: Ambulatory Visit | Attending: Orthopaedic Surgery | Admitting: Orthopaedic Surgery

## 2014-11-09 DIAGNOSIS — M25571 Pain in right ankle and joints of right foot: Secondary | ICD-10-CM | POA: Diagnosis not present

## 2014-11-09 DIAGNOSIS — Z5189 Encounter for other specified aftercare: Secondary | ICD-10-CM | POA: Insufficient documentation

## 2014-11-09 DIAGNOSIS — M25671 Stiffness of right ankle, not elsewhere classified: Secondary | ICD-10-CM | POA: Diagnosis not present

## 2014-11-09 DIAGNOSIS — R262 Difficulty in walking, not elsewhere classified: Secondary | ICD-10-CM

## 2014-11-09 NOTE — Therapy (Signed)
[facility] [address] [place], [place], [address] Phone: [phone]   Fax:  [phone]  Physical Therapy Treatment  Patient Details  Name: [name] MRN: [id number] Date of Birth: 1966/06/25  Encounter Date: 11/09/2014      PT End of Session - 11/09/14 1847    Visit Number 16   Number of Visits 28   Date for PT Re-Evaluation 11/29/14   Authorization Type BCBS   PT Start Time 1735   PT Stop Time 1820   PT Time Calculation (min) 45 min   Activity Tolerance Patient tolerated treatment well   Behavior During Therapy [facility] for tasks assessed/performed      Past Medical History  Diagnosis Date  . Headache(784.0)     tension/migraines  . GERD (gastroesophageal reflux disease)     TUMS as needed  . Asthma     triggered by weather changes; no inhaler  . 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   . History of uterine cancer   . Stenosing tenosynovitis of finger 12/2012    left middle finger  . Ganglion cyst of wrist 12/2012    left dorsal cyst  . PONV (postoperative nausea and vomiting)     also states stopped breathing after hernia surgery    Past Surgical History  Procedure Laterality Date  . Pubovaginal sling  2010  . Vagina reconstruction surgery      x 5 since 2010  . Abdominal hysterectomy  1994  . Ectopic pregnancy surgery  1995    left tube and ovary removed  . Appendectomy  1996  . Tubal ligation  1992  . Salpingoophorectomy  1996    right  . Ventral hernia repair  09/06/2006  . Cystoscopy with hydrodistension and biopsy  04/01/2007  . Trigger finger release  12/17/2012    Procedure: RELEASE TRIGGER FINGER/A-1 PULLEY;  Surgeon: Wynonia Sours, MD;  Location: [place];  Service: Orthopedics;  Laterality: Left;  . Ganglion cyst excision  12/17/2012    Procedure: REMOVAL GANGLION OF WRIST;  Surgeon: Wynonia Sours, MD;  Location: [place];  Service: Orthopedics;  Laterality: Left;  . Colonoscopy N/A  05/08/2013    Procedure: COLONOSCOPY;  Surgeon: Rogene Houston, MD;  [facility];  Service: Endoscopy;  Laterality: N/A;  240  . Hardware removal Right 08/09/2014    Procedure: Placement Right Ankle Tight Rope, Removal Plate and Screws;  Surgeon: Marybelle Killings, MD;  Location: [place];  Service: Orthopedics;  Laterality: Right;    There were no vitals taken for this visit.  Visit Diagnosis:  Pain in joint, ankle and foot, right  Difficulty walking  Stiffness of ankle joint, right      Subjective Assessment - 11/09/14 1750    Symptoms Patient is working full time through pain. notes a box recently fell on foot. pain increases by end of the day along with swelling. pain 1/10   Currently in Pain? Yes   Pain Score 1    Pain Orientation Right;Medial   Pain Descriptors / Indicators Constant   Pain Onset More than a month ago   Pain Frequency Intermittent            [facility] Adult PT Treatment/Exercise - 11/09/14 0001    Knee/Hip Exercises: Standing   Heel Raises 10 reps;Limitations;4 sets   Heel Raises Limitations up on both down on RT , 2 sets toes forward and 2 steps toes in  Other Standing Knee Exercises 3D ankle excursiotn 10x, 10x on airex pad   Other Standing Knee Exercises Walking on heels, toes in, backwards, forwards 65f each    Manual Therapy   Manual Therapy Joint mobilization   Joint Mobilization grade 2-4 joint mobilization throughtou foot.patient reported decreased pain with subtalar distraction.    Ankle Exercises: Stretches   Soleus Stretch 2 reps;20 seconds   Soleus Stretch Limitations 3 way   Gastroc Stretch 3 reps;20 seconds   Gastroc Stretch Limitations standing.             PT Short Term Goals - 11/09/14 1847    PT SHORT TERM GOAL #1   Title Pain level to be no more than a 5/10 80 % of the day   Time 2   Period Weeks   Status Not Met   PT SHORT TERM GOAL #2   Title Improve ROM by 5 degrees to allow more normalized gt   Time 2   Period  Weeks   Status Achieved   PT SHORT TERM GOAL #3   Title Pt to be able to sit for an hour with comfort   Status Achieved   PT SHORT TERM GOAL #4   Title Pt to be able to stand for 20 minutes to make a small meal     Status Achieved   PT SHORT TERM GOAL #5   Title Pt to be able to walk for 30 minutes to be able to shop   Time 2   Period Weeks   Status Achieved          PT Long Term Goals - 11/09/14 1847    PT LONG TERM GOAL #1   Title Pt pain to be no greater than a 2/10   Time 4   Period Weeks   Status Not Met   PT LONG TERM GOAL #2   Title Pt ROM to be improved by 10 degrees to allow normalized gt   Time 4   Period Weeks   Status Achieved   PT LONG TERM GOAL #3   Title Pt to be able to sit for two hours to allow traveling   Time 4   Period Weeks   Status Achieved   PT LONG TERM GOAL #4   Title Pt to be able to stand for 30 minutes to be able to socialize   Time 4   Period Weeks   Status Achieved   PT LONG TERM GOAL #5   Title Pt to be able to walk for an hour to return to work duties    Time 4   Period Weeks   Status Achieved   PT LONG TERM GOAL #6   Title new as of 10/13/2014- Pt to be able to be on foot for 6 hours to be able to complete job duties without increased pain   Time 8   Period Weeks   Status On-going   PT LONG TERM GOAL #7   Title Pt to be able to stand for an hour in order to complete work activites   Time 8   Period Weeks   Status Not Met          Plan - 11/09/14 1847    Clinical Impression Statement Session focused on education and performance of eccentric calf raises with toes/fot neutral and toed in to increase calf and medial gastroc activation to improve ankle strength/stability and remodellign on Lt achilles. Patient noted increased swelling pain at  end of session secondary to increased activity on feet.    PT Next Visit Plan Reassess pain next session to determine efficacy of eccentric specific loading activities.  give t-band for  home strengthening as forgot this visit.  Begin single leg stance actvities for focus on glut med strengthening.    PT Plan Begin ionto with dexamethasone when order returned.  Recommend continuation X 3-4 more weeks to progress stability in Rt LE  and decrease pain. progress ankle strength vuia use of eccentric loading exercises.                                Problem List Patient Active Problem List   Diagnosis Date Noted  . Stiffness of joint, not elsewhere classified, ankle and foot 09/07/2014  . Pain in joint, ankle and foot 09/07/2014  . Difficulty in walking(719.7) 09/07/2014  . 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 Hematochezia 08/11/2013  . Constipation 08/11/2013  . RHEUMATOID ARTHRITIS 02/20/2010  . KNEE PAIN 02/20/2010  . FIBROMYALGIA 02/20/2010    Devona Konig PT DPT [phone]

## 2014-11-10 ENCOUNTER — Ambulatory Visit (HOSPITAL_COMMUNITY): Payer: BC Managed Care – PPO

## 2014-11-12 ENCOUNTER — Ambulatory Visit (HOSPITAL_COMMUNITY): Payer: BC Managed Care – PPO | Admitting: Physical Therapy

## 2014-11-15 ENCOUNTER — Ambulatory Visit (HOSPITAL_COMMUNITY)
Admission: RE | Admit: 2014-11-15 | Discharge: 2014-11-15 | Disposition: A | Discharge status: Home / Self Care | Payer: BC Managed Care – PPO | Source: Ambulatory Visit | Attending: Internal Medicine | Admitting: Internal Medicine

## 2014-11-15 DIAGNOSIS — R262 Difficulty in walking, not elsewhere classified: Secondary | ICD-10-CM

## 2014-11-15 DIAGNOSIS — M25671 Stiffness of right ankle, not elsewhere classified: Secondary | ICD-10-CM

## 2014-11-15 DIAGNOSIS — Z5189 Encounter for other specified aftercare: Secondary | ICD-10-CM | POA: Diagnosis not present

## 2014-11-15 DIAGNOSIS — M25571 Pain in right ankle and joints of right foot: Secondary | ICD-10-CM

## 2014-11-15 NOTE — Therapy (Signed)
[facility] [address] [place], [place], [address] Phone: [phone]   Fax:  [phone]  Physical Therapy Treatment  Patient Details  Name: [name] MRN: [id number] Date of Birth: February 01, 1966  Encounter Date: 11/15/2014    Past Medical History  Diagnosis Date  . Headache(784.0)     tension/migraines  . GERD (gastroesophageal reflux disease)     TUMS as needed  . Asthma     triggered by weather changes; no inhaler  . 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   . History of uterine cancer   . Stenosing tenosynovitis of finger 12/2012    left middle finger  . Ganglion cyst of wrist 12/2012    left dorsal cyst  . PONV (postoperative nausea and vomiting)     also states stopped breathing after hernia surgery    Past Surgical History  Procedure Laterality Date  . Pubovaginal sling  2010  . Vagina reconstruction surgery      x 5 since 2010  . Abdominal hysterectomy  1994  . Ectopic pregnancy surgery  1995    left tube and ovary removed  . Appendectomy  1996  . Tubal ligation  1992  . Salpingoophorectomy  1996    right  . Ventral hernia repair  09/06/2006  . Cystoscopy with hydrodistension and biopsy  04/01/2007  . Trigger finger release  12/17/2012    Procedure: RELEASE TRIGGER FINGER/A-1 PULLEY;  Surgeon: Wynonia Sours, MD;  Location: [place];  Service: Orthopedics;  Laterality: Left;  . Ganglion cyst excision  12/17/2012    Procedure: REMOVAL GANGLION OF WRIST;  Surgeon: Wynonia Sours, MD;  Location: [place];  Service: Orthopedics;  Laterality: Left;  . Colonoscopy N/A 05/08/2013    Procedure: COLONOSCOPY;  Surgeon: Rogene Houston, MD;  [facility];  Service: Endoscopy;  Laterality: N/A;  240  . Hardware removal Right 08/09/2014    Procedure: Placement Right Ankle Tight Rope, Removal Plate and Screws;  Surgeon: Marybelle Killings, MD;  Location: [place];  Service: Orthopedics;  Laterality: Right;     There were no vitals taken for this visit.  Visit Diagnosis:  Pain in joint, ankle and foot, right  Difficulty walking  Stiffness of ankle joint, right      Subjective Assessment - 11/15/14 1603    Symptoms Pt stated pain free today, has been doing a lot of walking required with work.  Biggest difficulty currently with stairs   Currently in Pain? No/denies            [facility] Adult PT Treatment/Exercise - 11/15/14 1608    Exercises   Exercises Ankle   Knee/Hip Exercises: Standing   Heel Raises 10 reps;Limitations;4 sets   Heel Raises Limitations up on both down on RT , 2 sets toes forward and 2 steps toes in    Lateral Step Up Right;10 reps;Hand Hold: 0;Step Height: 6"   Forward Step Up Right;10 reps;Hand Hold: 0;Step Height: 6"   Step Down Right;10 reps;Hand Hold: 1;Step Height: 4"   Functional Squat 10 reps   Functional Squat Limitations touch the floor    Stairs 5RT   Other Standing Knee Exercises 3D ankle excursiotn 10x, 10x on airex pad   Other Standing Knee Exercises Walking on heels, toes in, backwards, forwards 30f each    Ankle Exercises: Stretches   Soleus Stretch 3 reps;20 seconds   Soleus Stretch Limitations 3 way   Gastroc Stretch 3 reps;20  seconds   Gastroc Stretch Limitations standing.             PT Short Term Goals - 11/15/14 1633    PT SHORT TERM GOAL #1   Title Pain level to be no more than a 5/10 80 % of the day   Time 2   Period Weeks   Status Not Met   PT SHORT TERM GOAL #2   Title Improve ROM by 5 degrees to allow more normalized gt   Time 2   Period Weeks   Status Achieved   PT SHORT TERM GOAL #3   Title Pt to be able to sit for an hour with comfort   Status Achieved   PT SHORT TERM GOAL #4   Title Pt to be able to stand for 20 minutes to make a small meal     Status Achieved   PT SHORT TERM GOAL #5   Title Pt to be able to walk for 30 minutes to be able to shop   Time 2   Period Weeks   Status Achieved          PT  Long Term Goals - 11/15/14 1633    PT LONG TERM GOAL #1   Title Pt pain to be no greater than a 2/10   Time 4   Period Weeks   Status Not Met   PT LONG TERM GOAL #2   Title Pt ROM to be improved by 10 degrees to allow normalized gt   Time 4   Period Weeks   Status Achieved   PT LONG TERM GOAL #3   Title Pt to be able to sit for two hours to allow traveling   Time 4   Period Weeks   Status Achieved   PT LONG TERM GOAL #4   Title Pt to be able to stand for 30 minutes to be able to socialize   Time 4   Period Weeks   Status Achieved   PT LONG TERM GOAL #5   Title Pt to be able to walk for an hour to return to work duties    Time 4   Period Weeks   Status Achieved   PT LONG TERM GOAL #6   Title new as of 10/13/2014- Pt to be able to be on foot for 6 hours to be able to complete job duties without increased pain   Time 8   Period Weeks   Status On-going   PT LONG TERM GOAL #7   Title Pt to be able to stand for an hour in order to complete work activites   Time 8   Period Weeks   Status Not Met          Plan - 11/15/14 1633    Clinical Impression Statement Pt unable to complete full session due to having to RTW.  Pt able to complete all exercises wtihout c/o pain.  Pt with noted instability wtih ankle excursions on airex.  No increase in pain at end of session.   PT Next Visit Plan Give t-band for home strengthening and review next visit.  Begin single leg stance actvities for focus on glut med strengthening. May begin ionto with dex if/when order received and still having pain.        Problem List Patient Active Problem List   Diagnosis Date Noted  . Stiffness of joint, not elsewhere classified, ankle and foot 09/07/2014  . Pain in joint, ankle and foot 09/07/2014  .  Difficulty in walking(719.7) 09/07/2014  . 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 Hematochezia 08/11/2013  . Constipation 08/11/2013  . RHEUMATOID ARTHRITIS 02/20/2010  . KNEE PAIN 02/20/2010  .  FIBROMYALGIA 02/20/2010    Teena Irani, PTA/CLT [phone] 11/15/2014, 4:37 PM

## 2014-11-17 ENCOUNTER — Ambulatory Visit (HOSPITAL_COMMUNITY): Payer: BC Managed Care – PPO | Admitting: Physical Therapy

## 2014-11-17 ENCOUNTER — Encounter (HOSPITAL_COMMUNITY): Payer: BC Managed Care – PPO | Admitting: Physical Therapy

## 2014-11-19 ENCOUNTER — Ambulatory Visit (HOSPITAL_COMMUNITY): Payer: BC Managed Care – PPO | Admitting: Physical Therapy

## 2014-11-22 ENCOUNTER — Ambulatory Visit (HOSPITAL_COMMUNITY): Payer: BC Managed Care – PPO | Admitting: Physical Therapy

## 2014-11-22 ENCOUNTER — Encounter (HOSPITAL_COMMUNITY): Payer: BC Managed Care – PPO | Admitting: Physical Therapy

## 2014-11-22 ENCOUNTER — Telehealth (HOSPITAL_COMMUNITY): Payer: Self-pay | Admitting: Physical Therapy

## 2014-11-22 NOTE — Telephone Encounter (Signed)
Requested to  be D/C due to work schedule not allowing her to come to PT

## 2014-11-24 ENCOUNTER — Ambulatory Visit (HOSPITAL_COMMUNITY): Payer: BC Managed Care – PPO | Admitting: Physical Therapy

## 2014-11-24 ENCOUNTER — Encounter (HOSPITAL_COMMUNITY): Payer: BC Managed Care – PPO | Admitting: Physical Therapy

## 2014-11-26 ENCOUNTER — Encounter (HOSPITAL_COMMUNITY): Payer: BC Managed Care – PPO | Admitting: Physical Therapy

## 2014-11-27 ENCOUNTER — Other Ambulatory Visit: Payer: Self-pay | Admitting: Obstetrics & Gynecology

## 2015-01-06 ENCOUNTER — Encounter: Payer: Self-pay | Admitting: Obstetrics & Gynecology

## 2015-01-06 ENCOUNTER — Ambulatory Visit (INDEPENDENT_AMBULATORY_CARE_PROVIDER_SITE_OTHER): Payer: BLUE CROSS/BLUE SHIELD | Admitting: Obstetrics & Gynecology

## 2015-01-06 VITALS — BP 100/80 | Ht 64.0 in | Wt 171.3 lb

## 2015-01-06 DIAGNOSIS — Z01419 Encounter for gynecological examination (general) (routine) without abnormal findings: Secondary | ICD-10-CM

## 2015-01-06 MED ORDER — ESTRADIOL 1 MG/GM TD GEL: 1.0000 | Freq: Every day | TRANSDERMAL | Status: DC

## 2015-01-06 NOTE — Progress Notes (Signed)
Patient ID: [name], female   DOB: May 05, 1966, 49 y.o.   MRN: [id number] Subjective:     [name] is a 49 y.o. female here for a routine exam.  No LMP recorded. Patient has had a hysterectomy. [id number] Birth Control Method:  hysterectomy Menstrual Calendar(currently): none  Current complaints: right ankle.   Current acute medical issues:  none   Recent Gynecologic History No LMP recorded. Patient has had a hysterectomy. Last Pap: na,   Last mammogram: 2015,  normal  Past Medical History  Diagnosis Date  . Headache(784.0)     tension/migraines  . GERD (gastroesophageal reflux disease)     TUMS as needed  . Asthma     triggered by weather changes; no inhaler  . 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   . History of uterine cancer   . Stenosing tenosynovitis of finger 12/2012    left middle finger  . Ganglion cyst of wrist 12/2012    left dorsal cyst  . PONV (postoperative nausea and vomiting)     also states stopped breathing after hernia surgery    Past Surgical History  Procedure Laterality Date  . Pubovaginal sling  2010  . Vagina reconstruction surgery      x 5 since 2010  . Abdominal hysterectomy  1994  . Ectopic pregnancy surgery  1995    left tube and ovary removed  . Appendectomy  1996  . Tubal ligation  1992  . Salpingoophorectomy  1996    right  . Ventral hernia repair  09/06/2006  . Cystoscopy with hydrodistension and biopsy  04/01/2007  . Trigger finger release  12/17/2012    Procedure: RELEASE TRIGGER FINGER/A-1 PULLEY;  Surgeon: Wynonia Sours, MD;  Location: [facility];  Service: Orthopedics;  Laterality: Left;  . Ganglion cyst excision  12/17/2012    Procedure: REMOVAL GANGLION OF WRIST;  Surgeon: Wynonia Sours, MD;  Location: [facility];  Service: Orthopedics;  Laterality: Left;  . Colonoscopy N/A 05/08/2013    Procedure: COLONOSCOPY;  Surgeon: Rogene Houston, MD;  [facility];  Service: Endoscopy;  Laterality:  N/A;  240  . Hardware removal Right 08/09/2014    Procedure: Placement Right Ankle Tight Rope, Removal Plate and Screws;  Surgeon: Marybelle Killings, MD;  Location: [facility];  Service: Orthopedics;  Laterality: Right;  . Rt ankle surgery      OB History    Gravida Para Term Preterm AB TAB SAB Ectopic Multiple Living   3 2  2 1   1         History   Social History  . Marital Status: Married    Spouse Name: N/A    Number of Children: N/A  . Years of Education: N/A   Social History Main Topics  . Smoking status: Never Smoker   . Smokeless tobacco: Never Used  . Alcohol Use: No  . Drug Use: No  . Sexual Activity: Yes    Birth Control/ Protection: Surgical   Other Topics Concern  . None   Social History Narrative    Family History  Problem Relation Age of Onset  . Lung cancer Mother   . Bladder Cancer Mother   . Cervical cancer Mother   . Heart disease Father   . Cancer Maternal Grandmother      Current outpatient prescriptions:  .  Biotin 1000 MCG tablet, Take 1,000 mcg by mouth 2 (two) times daily., Disp: , Rfl:  .  estradiol (VIVELLE-DOT) 0.075 MG/24HR, PLACE 1 PATCH ONTO THE SKIN 2 (TWO) TIMES A WEEK., Disp: 8 patch, Rfl: 11 .  gabapentin (NEURONTIN) 400 MG capsule, Take 800 mg by mouth at bedtime. , Disp: , Rfl:  .  pantoprazole (PROTONIX) 20 MG tablet, Take 20 mg by mouth daily., Disp: , Rfl:  .  estradiol (VIVELLE-DOT) 0.075 MG/24HR, Place 1 patch onto the skin 2 (two) times a week. Tuesdays and fridays, Disp: , Rfl:  .  HYDROcodone-acetaminophen (NORCO) 5-325 MG per tablet, Take 2 tablets by mouth every 6 (six) hours as needed for moderate pain or severe pain. (Patient not taking: Reported on 01/06/2015), Disp: 40 tablet, Rfl: 0 .  Multiple Vitamin (MULTIVITAMIN WITH MINERALS) TABS tablet, Take 1 tablet by mouth daily., Disp: , Rfl:  .  ondansetron (ZOFRAN) 4 MG tablet, Take 4 mg by mouth every 6 (six) hours as needed for nausea or vomiting., Disp: , Rfl:  .  terbinafine  (LAMISIL) 250 MG tablet, Take 250 mg by mouth daily., Disp: , Rfl:   Review of Systems  Review of Systems  Constitutional: Negative for fever, chills, weight loss, malaise/fatigue and diaphoresis.  HENT: Negative for hearing loss, ear pain, nosebleeds, congestion, sore throat, neck pain, tinnitus and ear discharge.   Eyes: Negative for blurred vision, double vision, photophobia, pain, discharge and redness.  Respiratory: Negative for cough, hemoptysis, sputum production, shortness of breath, wheezing and stridor.   Cardiovascular: Negative for chest pain, palpitations, orthopnea, claudication, leg swelling and PND.  Gastrointestinal: negative for abdominal pain. Negative for heartburn, nausea, vomiting, diarrhea, constipation, blood in stool and melena.  Genitourinary: Negative for dysuria, urgency, frequency, hematuria and flank pain.  Musculoskeletal: Negative for myalgias, back pain, joint pain and falls.  Skin: Negative for itching and rash.  Neurological: Negative for dizziness, tingling, tremors, sensory change, speech change, focal weakness, seizures, loss of consciousness, weakness and headaches.  Endo/Heme/Allergies: Negative for environmental allergies and polydipsia. Does not bruise/bleed easily.  Psychiatric/Behavioral: Negative for depression, suicidal ideas, hallucinations, memory loss and substance abuse. The patient is not nervous/anxious and does not have insomnia.        Objective:  Blood pressure 100/80, height 5\' 4"  (1.626 m), weight 171 lb 4.8 oz (77.701 kg).   Physical Exam  Vitals reviewed. Constitutional: She is oriented to person, place, and time. She appears well-developed and well-nourished.  HENT:  Head: Normocephalic and atraumatic.        Right Ear: External ear normal.  Left Ear: External ear normal.  Nose: Nose normal.  Mouth/Throat: Oropharynx is clear and moist.  Eyes: Conjunctivae and EOM are normal. Pupils are equal, round, and reactive to light.  Right eye exhibits no discharge. Left eye exhibits no discharge. No scleral icterus.  Neck: Normal range of motion. Neck supple. No tracheal deviation present. No thyromegaly present.  Cardiovascular: Normal rate, regular rhythm, normal heart sounds and intact distal pulses.  Exam reveals no gallop and no friction rub.   No murmur heard. Respiratory: Effort normal and breath sounds normal. No respiratory distress. She has no wheezes. She has no rales. She exhibits no tenderness.  GI: Soft. Bowel sounds are normal. She exhibits no distension and no mass. There is no tenderness. There is no rebound and no guarding.  Genitourinary:  Breasts no masses skin changes or nipple changes bilaterally      Vulva is normal without lesions Vagina is pink moist without discharge Cervix absent Uterus is absent Adnexa is negative with normal sized ovaries  Musculoskeletal: Normal range of motion. She exhibits no edema and no tenderness.  Neurological: She is alert and oriented to person, place, and time. She has normal reflexes. She displays normal reflexes. No cranial nerve deficit. She exhibits normal muscle tone. Coordination normal.  Skin: Skin is warm and dry. No rash noted. No erythema. No pallor.  Psychiatric: She has a normal mood and affect. Her behavior is normal. Judgment and thought content normal.       Assessment:    Healthy female exam.    Plan:    Hormone replacement therapy: hormone replacement therapy: divigel 1mg . Follow up in: 1 year.

## 2015-01-10 ENCOUNTER — Telehealth: Payer: Self-pay | Admitting: *Deleted

## 2015-01-10 ENCOUNTER — Other Ambulatory Visit: Payer: Self-pay | Admitting: Obstetrics & Gynecology

## 2015-01-10 DIAGNOSIS — Z1231 Encounter for screening mammogram for malignant neoplasm of breast: Secondary | ICD-10-CM

## 2015-01-10 MED ORDER — ESTRADIOL 0.075 MG/24HR TD PTTW: MEDICATED_PATCH | TRANSDERMAL | Status: DC

## 2015-01-10 NOTE — Telephone Encounter (Signed)
Pt states the Estradiol Gel is more expensive than the patch with her insurance. Pt requesting RX for the Estradiol patch.

## 2015-01-12 NOTE — Addendum Note (Signed)
Encounter addended by: Leeroy Cha, PT on: 01/12/2015 10:36 AM<BR>     Documentation filed: Episodes, Clinical Notes

## 2015-01-12 NOTE — Therapy (Signed)
[facility] [place], [place], [address] Phone: [phone]   Fax:  [phone]  Patient Details  Name: [name] MRN: [id number] Date of Birth: 1966-05-15 Referring Provider:  Asencion Noble, MD  Encounter Date: 11/15/2014  PHYSICAL THERAPY DISCHARGE SUMMARY  Visits from Start of Care:17  Current functional level related to goals / functional outcomes: Pt is currently able to walk for and hour sit for 2 hours; ROM improved to normalize gt;    Remaining deficits: Pain level   Education / Equipment: HEP  Plan: Patient agrees to discharge.  Patient goals were met. Patient is being discharged due to the physician's request.  ?????      Shivansh Hardaway,CINDY 01/12/2015, 10:35 AM  [place], [place], [address] Phone: [phone]   Fax:  [phone]

## 2015-02-09 ENCOUNTER — Ambulatory Visit (INDEPENDENT_AMBULATORY_CARE_PROVIDER_SITE_OTHER): Payer: BLUE CROSS/BLUE SHIELD | Admitting: Internal Medicine

## 2015-02-09 ENCOUNTER — Encounter (INDEPENDENT_AMBULATORY_CARE_PROVIDER_SITE_OTHER): Payer: Self-pay | Admitting: Internal Medicine

## 2015-02-09 VITALS — BP 102/72 | HR 80 | Temp 98.0°F | Ht 64.0 in | Wt 170.6 lb

## 2015-02-09 DIAGNOSIS — K629 Disease of anus and rectum, unspecified: Secondary | ICD-10-CM

## 2015-02-09 DIAGNOSIS — K602 Anal fissure, unspecified: Secondary | ICD-10-CM

## 2015-02-09 MED ORDER — DILTIAZEM GEL 2 %: 1.0000 "application " | Freq: Two times a day (BID) | CUTANEOUS | Status: DC

## 2015-02-09 NOTE — Progress Notes (Signed)
Subjective:    Patient ID: [name], female    DOB: October 01, 1966, 49 y.o.   MRN: [id number]  HPI Presents today with c/o rectal bleeding.   She tells me she has had bleeding when she wipes. She says her stool is red. When she has bleeding she says her stools ar not hard. She describes as "alot of blood". Symptoms x 1 weeks.  Rectal bleeding occurs every other day.  She does have some dizziness when she walks a long distance about a week ago.  There is no abdominal pain.  She says she stays constipated which is normal for her. Stools are hard and little. Stools are small but she says this is normal. Appetite is not good. She says she has lost her appetite. She has lost about 5 pounds which was intentional.  She eats 1-2 meals a day.  She does have acid reflux and takes TUMs prn. No melena.     Last colonoscopy was in 2014 with four tubular adenomas. Next colonoscopy due in 2019.  Family hx of colon cancer in a grandmother age 1 (Maternal).   05/08/2013 Colonoscopy: Dr. Laural Golden: rectal bleeding: Impression:  Examination performed to cecum. Noncompliant sigmoid colon resulting in difficult exam. Four small polyps ablated via cold biopsy and submitted together(two at cecum, one at hepatic flexure and fourth one at sigmoid colon). External hemorrhoids. All 4 polyps or tubular adenomas. Results have been reviewed with patient. Next colonoscopy in 5 years under fluoroscopy.  CBC    Component Value Date/Time   WBC 6.4 09/06/2014 0949   RBC 3.90 09/06/2014 0949   HGB 13.0 09/06/2014 0949   HCT 37.6 09/06/2014 0949   PLT 199 09/06/2014 0949   MCV 96.4 09/06/2014 0949   MCH 33.3 09/06/2014 0949   MCHC 34.6 09/06/2014 0949   RDW 12.7 09/06/2014 0949   LYMPHSABS 2.1 09/06/2014 0949   MONOABS 0.5 09/06/2014 0949   EOSABS 0.2 09/06/2014 0949   BASOSABS 0.0 09/06/2014 1025     Review of Systems Married. Two children in good health. Works at [employer].    Past Medical History    Diagnosis Date  . Headache(784.0)     tension/migraines  . GERD (gastroesophageal reflux disease)     TUMS as needed  . Asthma     triggered by weather changes; no inhaler  . 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   . History of uterine cancer   . Stenosing tenosynovitis of finger 12/2012    left middle finger  . Ganglion cyst of wrist 12/2012    left dorsal cyst  . PONV (postoperative nausea and vomiting)     also states stopped breathing after hernia surgery  . Rheumatoid arthritis   . Fibromyalgia   . Rectal bleeding     Past Surgical History  Procedure Laterality Date  . Pubovaginal sling  2010  . Vagina reconstruction surgery      x 5 since 2010  . Abdominal hysterectomy  1994  . Ectopic pregnancy surgery  1995    left tube and ovary removed  . Appendectomy  1996  . Tubal ligation  1992  . Salpingoophorectomy  1996    right  . Ventral hernia repair  09/06/2006  . Cystoscopy with hydrodistension and biopsy  04/01/2007  . Trigger finger release  12/17/2012    Procedure: RELEASE TRIGGER FINGER/A-1 PULLEY;  Surgeon: Wynonia Sours, MD;  Location: [facility];  Service: Orthopedics;  Laterality: Left;  . Ganglion  cyst excision  12/17/2012    Procedure: REMOVAL GANGLION OF WRIST;  Surgeon: Wynonia Sours, MD;  Location: [facility];  Service: Orthopedics;  Laterality: Left;  . Colonoscopy N/A 05/08/2013    Procedure: COLONOSCOPY;  Surgeon: Rogene Houston, MD;  [facility];  Service: Endoscopy;  Laterality: N/A;  240  . Hardware removal Right 08/09/2014    Procedure: Placement Right Ankle Tight Rope, Removal Plate and Screws;  Surgeon: Marybelle Killings, MD;  Location: [facility];  Service: Orthopedics;  Laterality: Right;  . Rt ankle surgery      Allergies  Allergen Reactions  . Imitrex [Sumatriptan]     Causes a migraine  . Percocet [Oxycodone-Acetaminophen] Other (See Comments)    Eyes dilate  . Cymbalta [Duloxetine Hcl] Rash  . Latex Rash  .  Propoxyphene N-Acetaminophen Nausea And Vomiting and Rash  . Tramadol Rash    Current Outpatient Prescriptions on File Prior to Visit  Medication Sig Dispense Refill  . gabapentin (NEURONTIN) 400 MG capsule Take 800 mg by mouth at bedtime.     . pantoprazole (PROTONIX) 20 MG tablet Take 20 mg by mouth daily.     No current facility-administered medications on file prior to visit.     Objective:   Physical ExamBlood pressure 102/72, pulse 80, temperature 98 F (36.7 C), height 5\' 4"  (1.626 m), weight 170 lb 9.6 oz (77.384 kg).  Alert and oriented. Skin warm and dry. Oral mucosa is moist.   . Sclera anicteric, conjunctivae is pink. Thyroid not enlarged. No cervical lymphadenopathy. Lungs clear. Heart regular rate and rhythm.  Abdomen is soft. Bowel sounds are positive. No hepatomegaly. No abdominal masses felt. No tenderness.  No edema to lower extremities.  Stool brown and guaiac positive. Rectal fissure at 6 o clock noted.noted on exam with bleeding during rectal exam.       Assessment & Plan:  Rectal bleeding from rectal fissure. Constipation. Stool softener daily.  Cardizem 2% gel, small amt twice a day to rectum.

## 2015-02-09 NOTE — Patient Instructions (Addendum)
Diltiazem 2%. Apply twice a day.  OV 3 months. Stool softener daily.

## 2015-02-24 ENCOUNTER — Encounter (INDEPENDENT_AMBULATORY_CARE_PROVIDER_SITE_OTHER): Payer: Self-pay | Admitting: *Deleted

## 2015-03-18 ENCOUNTER — Other Ambulatory Visit (HOSPITAL_COMMUNITY): Payer: Self-pay | Admitting: Radiology

## 2015-03-18 DIAGNOSIS — G4730 Sleep apnea, unspecified: Secondary | ICD-10-CM

## 2015-03-20 IMAGING — CR DG CHEST 2V
2 series · 2 of 2 positions shown · non-contrast
Comparison: 02/16/2011.

CLINICAL DATA: Chest pain for 2 days.

EXAM:
CHEST  2 VIEW

[view not recorded (1 of 2)]
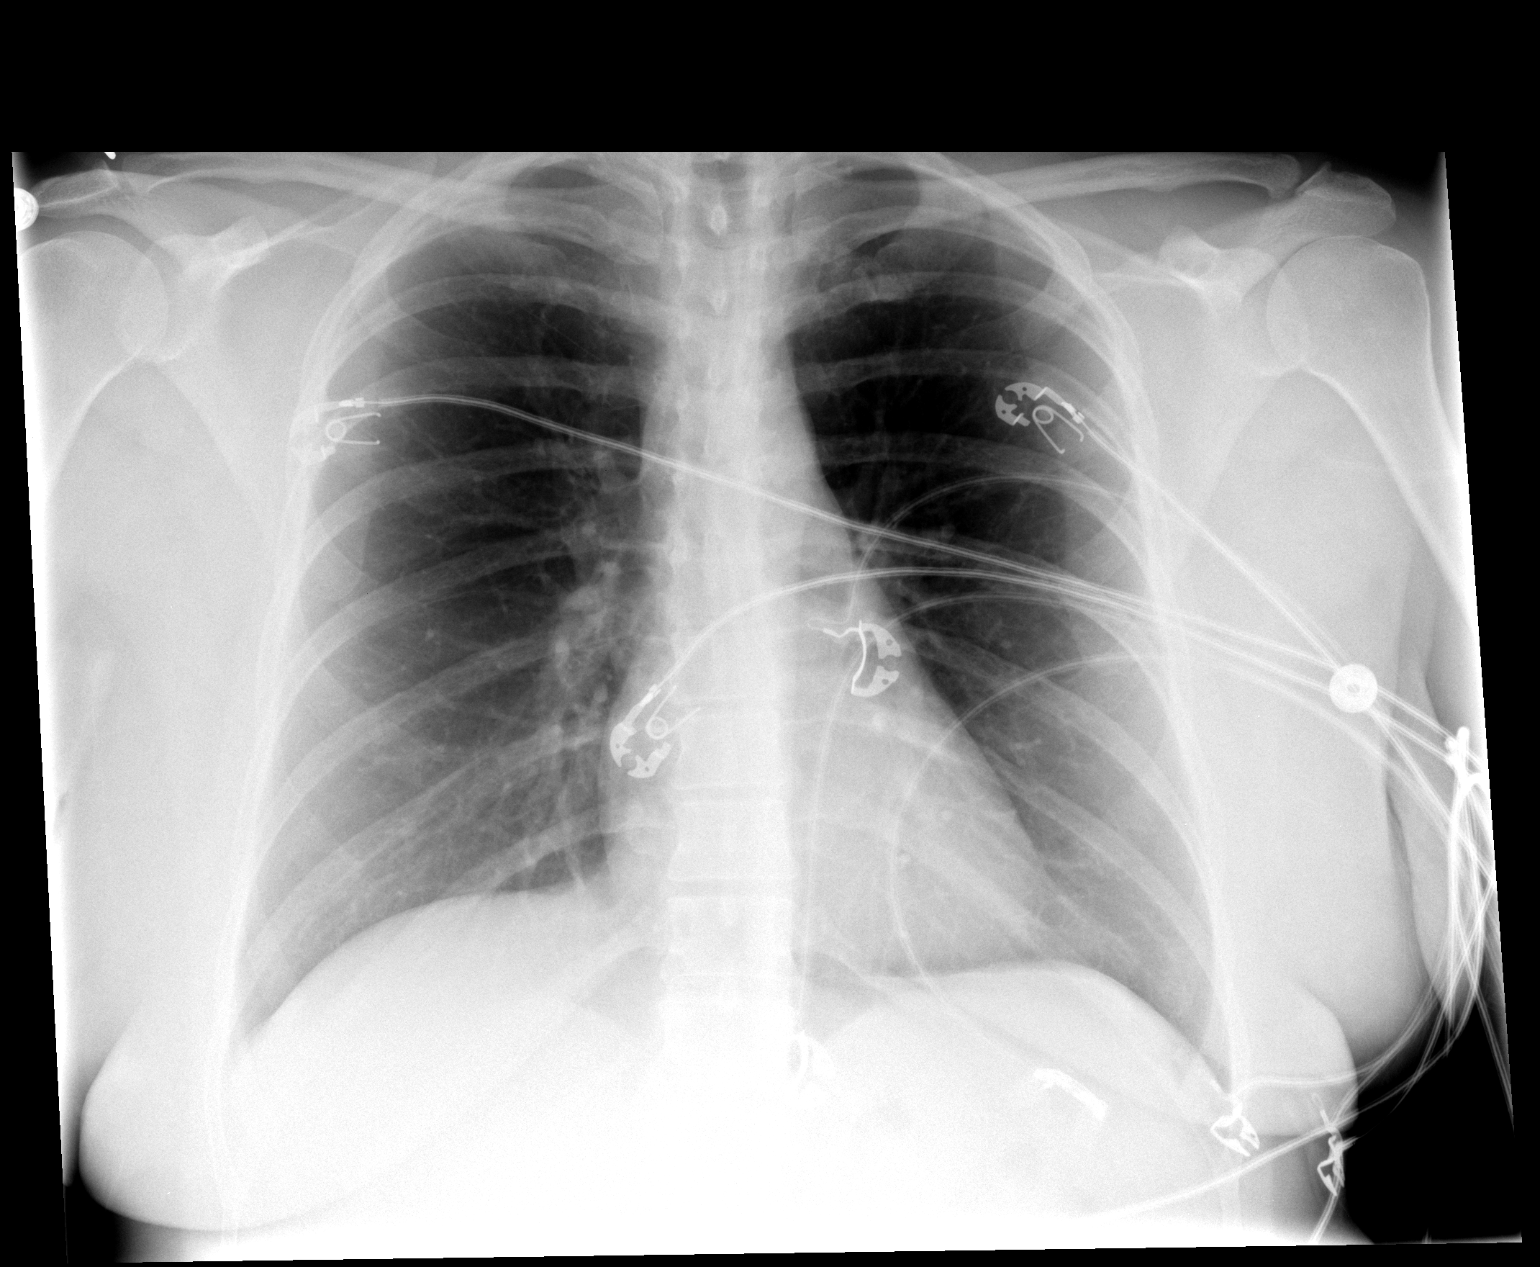

[view not recorded (2 of 2)]
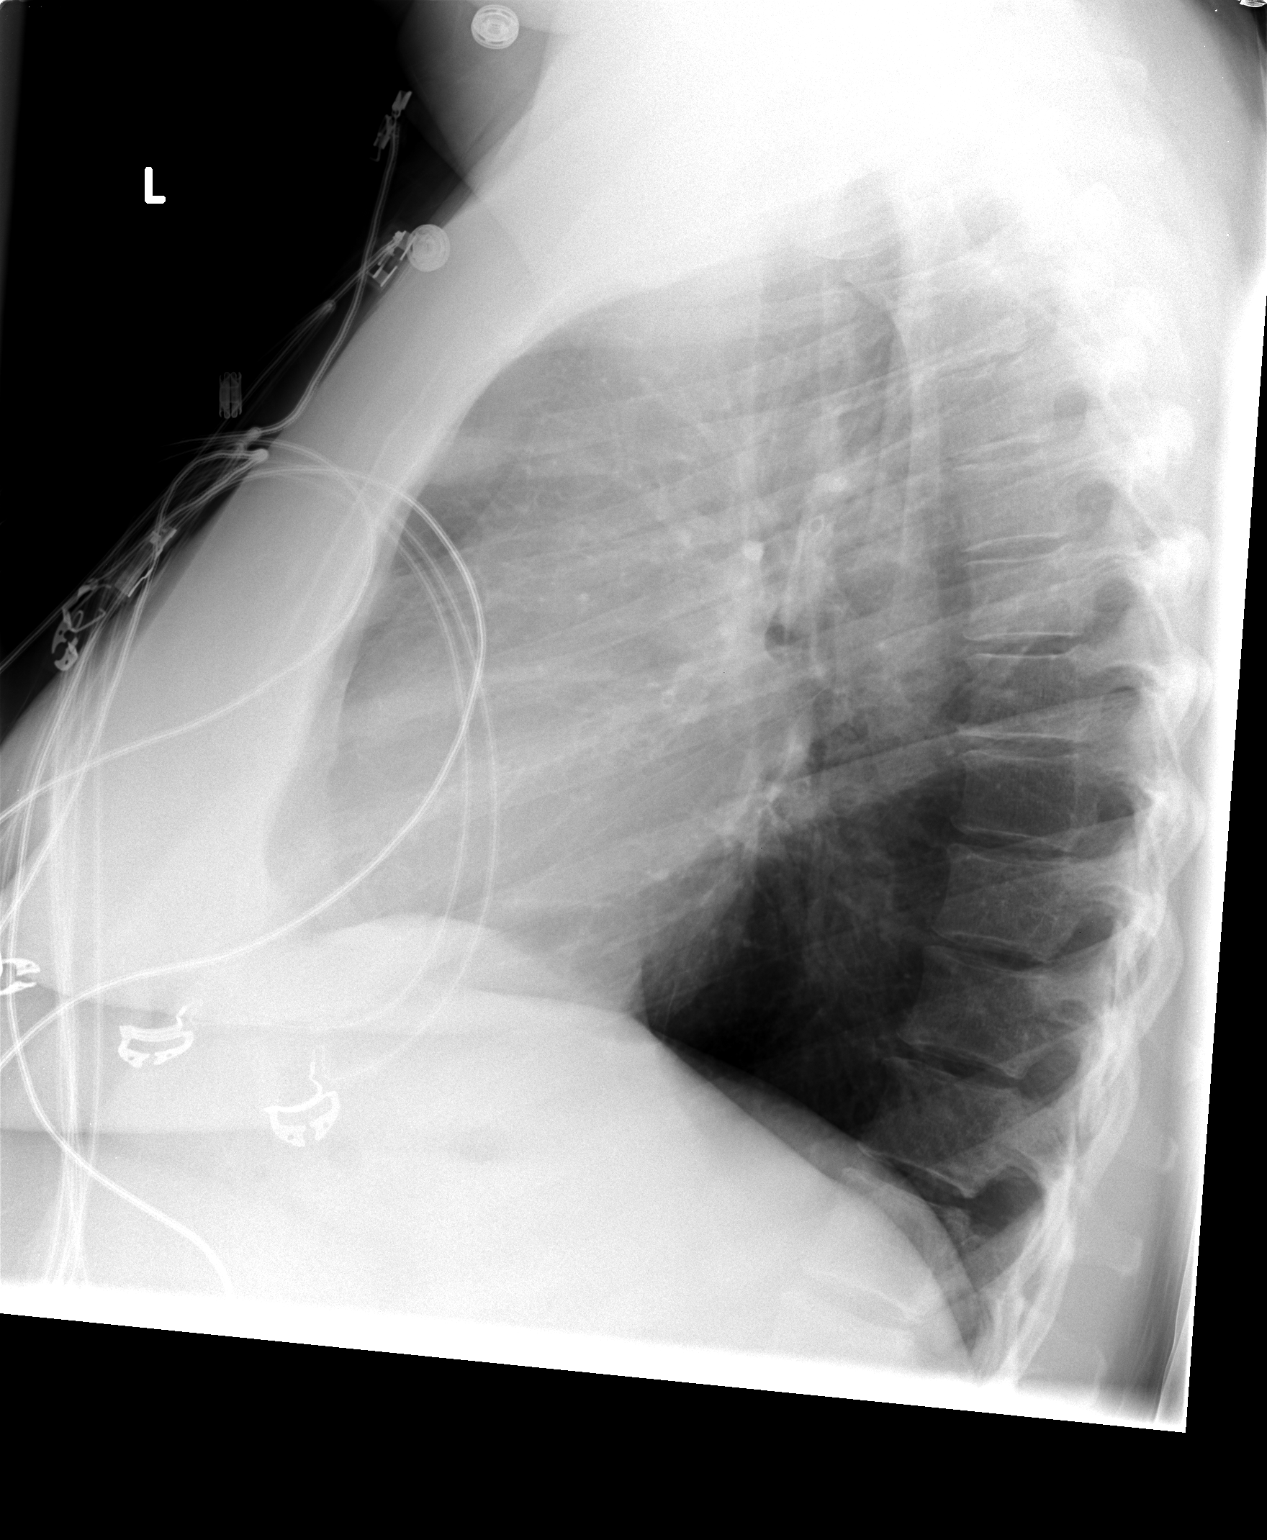

[2 of 2 positions shown; findings below may reference images not displayed]

FINDINGS: AP and lateral views of the chest show no focal airspace
consolidation or pulmonary edema. No pleural effusion or
pneumothorax. The cardiopericardial silhouette is within normal
limits for size. Imaged bony structures of the thorax are intact.
Telemetry leads overlie the chest.
IMPRESSION: No active cardiopulmonary disease.

## 2015-03-27 ENCOUNTER — Ambulatory Visit: Payer: BLUE CROSS/BLUE SHIELD | Attending: Internal Medicine | Admitting: Sleep Medicine

## 2015-03-27 DIAGNOSIS — G4730 Sleep apnea, unspecified: Secondary | ICD-10-CM

## 2015-03-28 ENCOUNTER — Encounter: Payer: Self-pay | Admitting: Neurology

## 2015-03-28 NOTE — Sleep Study (Signed)
  [place] A. Merlene Laughter, MD     [email or web address]        NOCTURNAL POLYSOMNOGRAM    LOCATION: SLEEP LAB FACILITY: [facility]   PHYSICIAN: Muskaan Smet A. Merlene Laughter, M.D.   DATE OF STUDY: 03/27/2015.   REFERRING PHYSICIAN: Asencion Noble.   INDICATIONS: The patient is a 49 year old presents with fatigue, awakening with shortness of breath and snoring.  MEDICATIONS:  Prior to Admission medications   Medication Sig Start Date End Date Taking? Authorizing Provider  diltiazem 2 % GEL Apply 1 application topically 2 (two) times daily. Apply small amount to rectal area twice a day. 02/09/15   Butch Penny, NP  gabapentin (NEURONTIN) 400 MG capsule Take 800 mg by mouth at bedtime.  04/16/14 04/16/15  Historical Provider, MD  pantoprazole (PROTONIX) 20 MG tablet Take 20 mg by mouth daily.    Historical Provider, MD      EPWORTH SLEEPINESS SCALE: 2.   BMI: 29.   ARCHITECTURAL SUMMARY: Total recording time was 421 minutes. Sleep efficiency 87 %. Sleep latency 41 minutes. REM latency 176 minutes. Stage NI 3 %, N2 51 % and N3 27 % and REM sleep 18 %.    RESPIRATORY DATA:  Baseline oxygen saturation is 95 %. The lowest saturation is 88 %. The diagnostic AHI is 4. The RDI is 4. The REM AHI is 9.  LIMB MOVEMENT SUMMARY: PLM index 6.   ELECTROCARDIOGRAM SUMMARY: Average heart rate is 60 with no significant dysrhythmias observed.   IMPRESSION:  1. Mild periodic limb movement of sleep otherwise, unremarkable nocturnal polysomnography.  Thanks for this referral.  Adrea Sherpa A. Merlene Laughter, M.D. Diplomat, Tax adviser of Sleep Medicine.

## 2015-04-02 NOTE — Addendum Note (Signed)
Addended by: Rito Ehrlich on: 04/02/2015 07:08 AM   Modules accepted: Level of Service

## 2015-04-12 ENCOUNTER — Ambulatory Visit (INDEPENDENT_AMBULATORY_CARE_PROVIDER_SITE_OTHER): Payer: BLUE CROSS/BLUE SHIELD | Admitting: Internal Medicine

## 2015-05-20 ENCOUNTER — Ambulatory Visit (HOSPITAL_COMMUNITY): Payer: Self-pay

## 2015-08-31 ENCOUNTER — Encounter (INDEPENDENT_AMBULATORY_CARE_PROVIDER_SITE_OTHER): Payer: Self-pay | Admitting: Internal Medicine

## 2015-09-07 ENCOUNTER — Other Ambulatory Visit (INDEPENDENT_AMBULATORY_CARE_PROVIDER_SITE_OTHER): Payer: Self-pay | Admitting: Internal Medicine

## 2015-09-07 ENCOUNTER — Ambulatory Visit (INDEPENDENT_AMBULATORY_CARE_PROVIDER_SITE_OTHER): Payer: Self-pay | Admitting: Internal Medicine

## 2015-09-07 ENCOUNTER — Encounter (INDEPENDENT_AMBULATORY_CARE_PROVIDER_SITE_OTHER): Payer: Self-pay | Admitting: Internal Medicine

## 2015-09-07 ENCOUNTER — Encounter (INDEPENDENT_AMBULATORY_CARE_PROVIDER_SITE_OTHER): Payer: Self-pay | Admitting: *Deleted

## 2015-09-07 VITALS — BP 112/80 | HR 72 | Temp 98.0°F | Ht 64.0 in | Wt 170.9 lb

## 2015-09-07 DIAGNOSIS — Z800 Family history of malignant neoplasm of digestive organs: Secondary | ICD-10-CM

## 2015-09-07 DIAGNOSIS — Z86010 Personal history of colonic polyps: Secondary | ICD-10-CM

## 2015-09-07 DIAGNOSIS — K625 Hemorrhage of anus and rectum: Secondary | ICD-10-CM

## 2015-09-07 DIAGNOSIS — R195 Other fecal abnormalities: Secondary | ICD-10-CM

## 2015-09-07 LAB — CBC WITH DIFFERENTIAL/PLATELET
Basophils Absolute: 0 10*3/uL (ref 0.0–0.1)
Basophils Relative: 0 % (ref 0–1)
Eosinophils Absolute: 0.2 10*3/uL (ref 0.0–0.7)
Eosinophils Relative: 2 % (ref 0–5)
HCT: 35.7 % — ABNORMAL LOW (ref 36.0–46.0)
Hemoglobin: 12.1 g/dL (ref 12.0–15.0)
Lymphocytes Relative: 46 % (ref 12–46)
Lymphs Abs: 3.7 10*3/uL (ref 0.7–4.0)
MCH: 32.8 pg (ref 26.0–34.0)
MCHC: 33.9 g/dL (ref 30.0–36.0)
MCV: 96.7 fL (ref 78.0–100.0)
MPV: 9.4 fL (ref 8.6–12.4)
Monocytes Absolute: 0.7 10*3/uL (ref 0.1–1.0)
Monocytes Relative: 9 % (ref 3–12)
Neutro Abs: 3.4 10*3/uL (ref 1.7–7.7)
Neutrophils Relative %: 43 % (ref 43–77)
Platelets: 214 10*3/uL (ref 150–400)
RBC: 3.69 MIL/uL — ABNORMAL LOW (ref 3.87–5.11)
RDW: 13.5 % (ref 11.5–15.5)
WBC: 8 10*3/uL (ref 4.0–10.5)

## 2015-09-07 NOTE — Patient Instructions (Signed)
Colonoscopy.  The risks and benefits such as perforation, bleeding, and infection were reviewed with the patient and is agreeable. 

## 2015-09-07 NOTE — Progress Notes (Signed)
Subjective:    Patient ID: [name], female    DOB: [birth year], 49 y.o.   MRN: [id number]  HPI Presents today with c/o rectal bleeding. She was last seen in March for same.  Noted on rectal exam in March she had a rectal fissure at 6:o'clock. She was started on Cardizem gel 2%. She tells me she has blood in her stools and blood clot. This has occurred x 2. Occurred 08/31/2015. She showed me pictures of the stool. She had brown stool with blood in the toilet. She did have lower abdominal pain when she had the BM. Appetite has been good .She has maintained her weight.  No abdominal pain.  Last colonoscopy was in 2014 with four tubular adenomas. Next colonoscopy due in 2019.  Family hx of colon cancer in a grandmother age 59 (Maternal).   05/08/2013 Colonoscopy: Dr. Laural Golden: rectal bleeding: Impression:  Examination performed to cecum. Noncompliant sigmoid colon resulting in difficult exam. Four small polyps ablated via cold biopsy and submitted together(two at cecum, one at hepatic flexure and fourth one at sigmoid colon). External hemorrhoids. All 4 polyps or tubular adenomas. Results have been reviewed with patient. Next colonoscopy in 5 years under fluoroscopy.     CBC    Component Value Date/Time   WBC 6.4 09/06/2014 0949   RBC 3.90 09/06/2014 0949   HGB 13.0 09/06/2014 0949   HCT 37.6 09/06/2014 0949   PLT 199 09/06/2014 0949   MCV 96.4 09/06/2014 0949   MCH 33.3 09/06/2014 0949   MCHC 34.6 09/06/2014 0949   RDW 12.7 09/06/2014 0949   LYMPHSABS 2.1 09/06/2014 0949   MONOABS 0.5 09/06/2014 0949   EOSABS 0.2 09/06/2014 0949   BASOSABS 0.0 09/06/2014 0949       Review of Systems Past Medical History  Diagnosis Date  . Headache(784.0)     tension/migraines  . GERD (gastroesophageal reflux disease)     TUMS as needed  . Asthma     triggered by weather changes; no inhaler  . 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   . History of uterine cancer   . Stenosing  tenosynovitis of finger 12/2012    left middle finger  . Ganglion cyst of wrist 12/2012    left dorsal cyst  . PONV (postoperative nausea and vomiting)     also states stopped breathing after hernia surgery  . Rheumatoid arthritis   . Fibromyalgia   . Rectal bleeding     Past Surgical History  Procedure Laterality Date  . Pubovaginal sling  2010  . Vagina reconstruction surgery      x 5 since 2010  . Abdominal hysterectomy  1994  . Ectopic pregnancy surgery  1995    left tube and ovary removed  . Appendectomy  1996  . Tubal ligation  1992  . Salpingoophorectomy  1996    right  . Ventral hernia repair  09/06/2006  . Cystoscopy with hydrodistension and biopsy  04/01/2007  . Trigger finger release  12/17/2012    Procedure: RELEASE TRIGGER FINGER/A-1 PULLEY;  Surgeon: Wynonia Sours, MD;  Location: [facility];  Service: Orthopedics;  Laterality: Left;  . Ganglion cyst excision  12/17/2012    Procedure: REMOVAL GANGLION OF WRIST;  Surgeon: Wynonia Sours, MD;  Location: [facility];  Service: Orthopedics;  Laterality: Left;  . Colonoscopy N/A 05/08/2013    Procedure: COLONOSCOPY;  Surgeon: Rogene Houston, MD;  [facility];  Service: Endoscopy;  Laterality: N/A;  240  . Hardware removal Right 08/09/2014    Procedure: Placement Right Ankle Tight Rope, Removal Plate and Screws;  Surgeon: Marybelle Killings, MD;  Location: [facility];  Service: Orthopedics;  Laterality: Right;  . Rt ankle surgery      Allergies  Allergen Reactions  . Imitrex [Sumatriptan]     Causes a migraine  . Percocet [Oxycodone-Acetaminophen] Other (See Comments)    Eyes dilate  . Cymbalta [Duloxetine Hcl] Rash  . Latex Rash  . Propoxyphene N-Acetaminophen Nausea And Vomiting and Rash  . Tramadol Rash    Current Outpatient Prescriptions on File Prior to Visit  Medication Sig Dispense Refill  . diltiazem 2 % GEL Apply 1 application topically 2 (two) times daily. Apply small amount to  rectal area twice a day. (Patient not taking: Reported on 09/07/2015) 30 g 0  . gabapentin (NEURONTIN) 400 MG capsule Take 800 mg by mouth at bedtime.      No current facility-administered medications on file prior to visit.        Objective:   Physical Exam Blood pressure 112/80, pulse 72, temperature 98 F (36.7 C), height 5\' 4"  (1.626 m), weight 170 lb 14.4 oz (77.52 kg).  Alert and oriented. Skin warm and dry. Oral mucosa is moist.   . Sclera anicteric, conjunctivae is pink. Thyroid not enlarged. No cervical lymphadenopathy. Lungs clear. Heart regular rate and rhythm.  Abdomen is soft. Bowel sounds are positive. No hepatomegaly. No abdominal masses felt. No tenderness.  No edema to lower extremities.         Assessment & Plan:  Rectal bleeding. Personal hx of colon polyps. Family hx of colon cancer in grandmother in her 45s. Colonic neoplasm needs to be ruled out. The risks and benefits such as perforation, bleeding, and infection were reviewed with the patient and is agreeable. CBC today

## 2015-09-07 NOTE — Telephone Encounter (Signed)
Patient needs osmo pill prep This encounter was created in error - please disregard.

## 2015-09-23 ENCOUNTER — Ambulatory Visit (HOSPITAL_COMMUNITY)
Admission: RE | Admit: 2015-09-23 | Discharge: 2015-09-23 | Disposition: A | Discharge status: Home / Self Care | Payer: BLUE CROSS/BLUE SHIELD | Source: Ambulatory Visit | Attending: Internal Medicine | Admitting: Internal Medicine

## 2015-09-23 ENCOUNTER — Encounter (HOSPITAL_COMMUNITY)
Admission: RE | Disposition: A | Discharge status: Home / Self Care | Payer: Self-pay | Source: Ambulatory Visit | Attending: Internal Medicine

## 2015-09-23 ENCOUNTER — Encounter (HOSPITAL_COMMUNITY): Payer: Self-pay | Admitting: *Deleted

## 2015-09-23 DIAGNOSIS — Z8542 Personal history of malignant neoplasm of other parts of uterus: Secondary | ICD-10-CM | POA: Insufficient documentation

## 2015-09-23 DIAGNOSIS — K5730 Diverticulosis of large intestine without perforation or abscess without bleeding: Secondary | ICD-10-CM | POA: Insufficient documentation

## 2015-09-23 DIAGNOSIS — Z86010 Personal history of colonic polyps: Secondary | ICD-10-CM | POA: Diagnosis not present

## 2015-09-23 DIAGNOSIS — M069 Rheumatoid arthritis, unspecified: Secondary | ICD-10-CM | POA: Insufficient documentation

## 2015-09-23 DIAGNOSIS — K625 Hemorrhage of anus and rectum: Secondary | ICD-10-CM | POA: Diagnosis not present

## 2015-09-23 DIAGNOSIS — K921 Melena: Secondary | ICD-10-CM | POA: Insufficient documentation

## 2015-09-23 DIAGNOSIS — K648 Other hemorrhoids: Secondary | ICD-10-CM | POA: Diagnosis not present

## 2015-09-23 DIAGNOSIS — Z79899 Other long term (current) drug therapy: Secondary | ICD-10-CM | POA: Insufficient documentation

## 2015-09-23 DIAGNOSIS — K219 Gastro-esophageal reflux disease without esophagitis: Secondary | ICD-10-CM | POA: Insufficient documentation

## 2015-09-23 DIAGNOSIS — Z800 Family history of malignant neoplasm of digestive organs: Secondary | ICD-10-CM

## 2015-09-23 DIAGNOSIS — K644 Residual hemorrhoidal skin tags: Secondary | ICD-10-CM | POA: Insufficient documentation

## 2015-09-23 HISTORY — PX: COLONOSCOPY: SHX5424

## 2015-09-23 SURGERY — COLONOSCOPY
Anesthesia: Moderate Sedation

## 2015-09-23 MED ORDER — MIDAZOLAM HCL 5 MG/5ML IJ SOLN
INTRAMUSCULAR | Status: DC | PRN
  Administered 2015-09-23: 3 mg via INTRAVENOUS
  Administered 2015-09-23 (×2): 1 mg via INTRAVENOUS

## 2015-09-23 MED ORDER — MEPERIDINE HCL 50 MG/ML IJ SOLN
INTRAMUSCULAR | Status: DC | PRN
  Administered 2015-09-23: 25 mg via INTRAVENOUS

## 2015-09-23 MED ORDER — SODIUM CHLORIDE 0.9 % IV SOLN
INTRAVENOUS | Status: DC
  Administered 2015-09-23: 1000 mL via INTRAVENOUS

## 2015-09-23 MED ORDER — PROMETHAZINE HCL 25 MG/ML IJ SOLN
INTRAMUSCULAR | Status: DC | PRN
  Administered 2015-09-23: 12.5 mg via INTRAVENOUS

## 2015-09-23 MED ORDER — DOCUSATE SODIUM 100 MG PO CAPS: 200.0000 mg | ORAL_CAPSULE | Freq: Every day | ORAL | Status: DC

## 2015-09-23 MED ORDER — STERILE WATER FOR IRRIGATION IR SOLN
Status: DC | PRN
  Administered 2015-09-23: 08:00:00

## 2015-09-23 MED ORDER — MEPERIDINE HCL 50 MG/ML IJ SOLN
INTRAMUSCULAR | Status: AC
  Filled 2015-09-23: qty 1

## 2015-09-23 MED ORDER — PROMETHAZINE HCL 25 MG/ML IJ SOLN
INTRAMUSCULAR | Status: AC
  Administered 2015-09-23: 12.5 mg via INTRAVENOUS
  Filled 2015-09-23: qty 1

## 2015-09-23 MED ORDER — HYDROCORTISONE ACETATE 25 MG RE SUPP: 25.0000 mg | Freq: Every day | RECTAL | Status: DC

## 2015-09-23 MED ORDER — MIDAZOLAM HCL 5 MG/5ML IJ SOLN
INTRAMUSCULAR | Status: AC
  Filled 2015-09-23: qty 10

## 2015-09-23 MED ORDER — BENEFIBER DRINK MIX PO PACK: 4.0000 g | PACK | Freq: Every day | ORAL | Status: DC

## 2015-09-23 NOTE — Op Note (Signed)
COLONOSCOPY PROCEDURE REPORT  PATIENT:  [name]  MR#:  [id number] Birthdate:  Feb 15, 1966, 49 y.o., female Endoscopist:  Dr. Rogene Houston, MD Referred By:  Dr. Asencion Noble, MD Procedure Date: 09/23/2015  Procedure:   Colonoscopy  Indications: Patient is 49 year old Caucasian female who underwent colonoscopy in May 2014 with removal of 4 small polyps and these are tubular adenomas. Patient now presents with frequent hematochezia. She passes fresh blood and clots. She says this occurs even when she is not constipated. Her bowels are irregular and at times she may go weeks without a bowel movement. Family history significant for CRC in maternal grandmother who died at 75 of postop complications.  Informed Consent:  The procedure and risks were reviewed with the patient and informed consent was obtained.  Medications:  Demerol 25 mg IV Versed 5 mg IV Promethazine 12.5 g IV 2.  Description of procedure:  After a digital rectal exam was performed, that colonoscope was advanced from the anus through the rectum and colon to the area of the cecum, ileocecal valve and appendiceal orifice. The cecum was deeply intubated. These structures were well-seen and photographed for the record. From the level of the cecum and ileocecal valve, the scope was slowly and cautiously withdrawn. The mucosal surfaces were carefully surveyed utilizing scope tip to flexion to facilitate fold flattening as needed. The scope was pulled down into the rectum where a thorough exam including retroflexion was performed. Terminal ileum was also examined.  Findings:   Prep excellent. Normal mucosa of terminal ileum. Single small diverticulum noted at proximal transverse colon otherwise mucosa of cecum ascending colon hepatic flexure, transverse colon and splenic flexure as well as descending and sigmoid colon was normal. Normal rectal mucosa. Small hemorrhoids below the dentate line along with focal  erythema.    Therapeutic/Diagnostic Maneuvers Performed:   None  Complications:  None  EBL: None  Cecal Withdrawal Time:  10 minutes  Impression:  Normal mucosa of terminal ileum. Single small diverticulum at proximal transverse colon. No evidence of recurrent polyps. External hemorrhoids with inflammation.  Recommendations:  Standard instructions given. High fiber diet. Anusol HC suppository 1 per rectum daily at bedtime for 2 weeks. Colace 20 mg by mouth daily at bedtime Benefiber 4 g by mouth daily at bedtime. Patient will keep symptom diary as to frequency of bowel movements and rectal bleeding until office visit in 3 months. Colonoscopy in 5 years.  REHMAN,NAJEEB U  09/23/2015 9:01 AM  CC: Dr. Asencion Noble, MD & Dr. Rayne Du ref. provider found

## 2015-09-23 NOTE — Discharge Instructions (Signed)
Resume usual medications and high fiber diet. Anusol HC suppository 1 per rectum daily at bedtime for 2 weeks. Colace 200 mg by mouth daily at bedtime. Benefiber 4 g by mouth daily at bedtime. Keep symptom diary as to frequency and consistency of bowel movements and rectal bleeding until office visit in 3 months. No driving for 24 hours. Next colonoscopy in 5 years.  Colonoscopy, Care After These instructions give you information on caring for yourself after your procedure. Your doctor may also give you more specific instructions. Call your doctor if you have any problems or questions after your procedure. HOME CARE  Do not drive for 24 hours.  Do not sign important papers or use machinery for 24 hours.  You may shower.  You may go back to your usual activities, but go slower for the first 24 hours.  Take rest breaks often during the first 24 hours.  Walk around or use warm packs on your belly (abdomen) if you have belly cramping or gas.  Drink enough fluids to keep your pee (urine) clear or pale yellow.  Resume your normal diet. Avoid heavy or fried foods.  Avoid drinking alcohol for 24 hours or as told by your doctor.  Only take medicines as told by your doctor. If a tissue sample (biopsy) was taken during the procedure:   Do not take aspirin or blood thinners for 7 days, or as told by your doctor.  Do not drink alcohol for 7 days, or as told by your doctor.  Eat soft foods for the first 24 hours. GET HELP IF: You still have a small amount of blood in your poop (stool) 2-3 days after the procedure. GET HELP RIGHT AWAY IF:  You have more than a small amount of blood in your poop.  You see clumps of tissue (blood clots) in your poop.  Your belly is puffy (swollen).  You feel sick to your stomach (nauseous) or throw up (vomit).  You have a fever.  You have belly pain that gets worse and medicine does not help. MAKE SURE YOU:  Understand these  instructions.  Will watch your condition.  Will get help right away if you are not doing well or get worse.   This information is not intended to replace advice given to you by your health care provider. Make sure you discuss any questions you have with your health care provider.   Document Released: 12/29/2010 Document Revised: 12/01/2013 Document Reviewed: 08/03/2013 Elsevier Interactive Patient Education 2016 Elsevier Inc.   High-Fiber Diet Fiber, also called dietary fiber, is a type of carbohydrate found in fruits, vegetables, whole grains, and beans. A high-fiber diet can have many health benefits. Your health care provider may recommend a high-fiber diet to help:  Prevent constipation. Fiber can make your bowel movements more regular.  Lower your cholesterol.  Relieve hemorrhoids, uncomplicated diverticulosis, or irritable bowel syndrome.  Prevent overeating as part of a weight-loss plan.  Prevent heart disease, type 2 diabetes, and certain cancers. WHAT IS MY PLAN? The recommended daily intake of fiber includes:  38 grams for men under age 71.  77 grams for men over age 76.  13 grams for women under age 37.  16 grams for women over age 84. You can get the recommended daily intake of dietary fiber by eating a variety of fruits, vegetables, grains, and beans. Your health care provider may also recommend a fiber supplement if it is not possible to get enough fiber through your diet.  WHAT DO I NEED TO KNOW ABOUT A HIGH-FIBER DIET?  Fiber supplements have not been widely studied for their effectiveness, so it is better to get fiber through food sources.  Always check the fiber content on thenutrition facts label of any prepackaged food. Look for foods that contain at least 5 grams of fiber per serving.  Ask your dietitian if you have questions about specific foods that are related to your condition, especially if those foods are not listed in the following  section.  Increase your daily fiber consumption gradually. Increasing your intake of dietary fiber too quickly may cause bloating, cramping, or gas.  Drink plenty of water. Water helps you to digest fiber. WHAT FOODS CAN I EAT? Grains Whole-grain breads. Multigrain cereal. Oats and oatmeal. Brown rice. Barley. Bulgur wheat. [place]. Bran muffins. Popcorn. Rye wafer crackers. Vegetables Sweet potatoes. Spinach. Kale. Artichokes. Cabbage. Broccoli. Green peas. Carrots. Squash. Fruits Berries. Pears. Apples. Oranges. Avocados. Prunes and raisins. Dried figs. Meats and Other Protein Sources [place], kidney, pinto, and soy beans. Split peas. Lentils. Nuts and seeds. Dairy Fiber-fortified yogurt. Beverages Fiber-fortified soy milk. Fiber-fortified orange juice. Other Fiber bars. The items listed above may not be a complete list of recommended foods or beverages. Contact your dietitian for more options. WHAT FOODS ARE NOT RECOMMENDED? Grains White bread. Pasta made with refined flour. White rice. Vegetables Fried potatoes. Canned vegetables. Well-cooked vegetables.  Fruits Fruit juice. Cooked, strained fruit. Meats and Other Protein Sources Fatty cuts of meat. Fried Sales executive or fried fish. Dairy Milk. Yogurt. Cream cheese. Sour cream. Beverages Soft drinks. Other Cakes and pastries. Butter and oils. The items listed above may not be a complete list of foods and beverages to avoid. Contact your dietitian for more information. WHAT ARE SOME TIPS FOR INCLUDING HIGH-FIBER FOODS IN MY DIET?  Eat a wide variety of high-fiber foods.  Make sure that half of all grains consumed each day are whole grains.  Replace breads and cereals made from refined flour or white flour with whole-grain breads and cereals.  Replace white rice with brown rice, bulgur wheat, or millet.  Start the day with a breakfast that is high in fiber, such as a cereal that contains at least 5 grams of fiber per  serving.  Use beans in place of meat in soups, salads, or pasta.  Eat high-fiber snacks, such as berries, raw vegetables, nuts, or popcorn.   This information is not intended to replace advice given to you by your health care provider. Make sure you discuss any questions you have with your health care provider.   Document Released: 11/26/2005 Document Revised: 12/17/2014 Document Reviewed: 05/11/2014 Elsevier Interactive Patient Education [employer].

## 2015-09-23 NOTE — H&P (Signed)
[name] is an 49 y.o. female.   Chief Complaint: Patient is here for colonoscopy. HPI: Patient is 49 year old Caucasian female who presents with intermittent rectal bleeding which always occurs with bowel movements. She passes fresh blood as well as clots. It happens couple of times a month. And she passes blood she also feels pain which starts in upper abdomen although they don't rectum. Her bowels are irregular. Occasionally she may be constipated. She says bleeding generally occurs when stool is soft. Patient had colonoscopy in May 2014 with removal of 4 small polyps and these are tubular adenomas. Family history significant for colon carcinoma in maternal grandmother who was 55 at the time of diagnosis and died of postop complications. She was treated at [facility].  Past Medical History  Diagnosis Date  . Headache(784.0)     tension/migraines  . GERD (gastroesophageal reflux disease)     TUMS as needed  . Asthma     triggered by weather changes; no inhaler  . 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   . History of uterine cancer   . Stenosing tenosynovitis of finger 12/2012    left middle finger  . Ganglion cyst of wrist 12/2012    left dorsal cyst  . PONV (postoperative nausea and vomiting)     also states stopped breathing after hernia surgery  . Rheumatoid arthritis ([place])   . Fibromyalgia   . Rectal bleeding     Past Surgical History  Procedure Laterality Date  . Pubovaginal sling  2010  . Vagina reconstruction surgery      x 5 since 2010  . Abdominal hysterectomy  1994  . Ectopic pregnancy surgery  1995    left tube and ovary removed  . Appendectomy  1996  . Tubal ligation  1992  . Salpingoophorectomy  1996    right  . Ventral hernia repair  09/06/2006  . Cystoscopy with hydrodistension and biopsy  04/01/2007  . Trigger finger release  12/17/2012    Procedure: RELEASE TRIGGER FINGER/A-1 PULLEY;  Surgeon: Wynonia Sours, MD;  Location: [place];  Service:  Orthopedics;  Laterality: Left;  . Ganglion cyst excision  12/17/2012    Procedure: REMOVAL GANGLION OF WRIST;  Surgeon: Wynonia Sours, MD;  Location: [place];  Service: Orthopedics;  Laterality: Left;  . Colonoscopy N/A 05/08/2013    Procedure: COLONOSCOPY;  Surgeon: Rogene Houston, MD;  [facility];  Service: Endoscopy;  Laterality: N/A;  240  . Hardware removal Right 08/09/2014    Procedure: Placement Right Ankle Tight Rope, Removal Plate and Screws;  Surgeon: Marybelle Killings, MD;  Location: [place];  Service: Orthopedics;  Laterality: Right;  . Rt ankle surgery      Family History  Problem Relation Age of Onset  . Lung cancer Mother   . Cervical cancer Mother   . Heart disease Father   . Cancer Maternal Grandmother     bladder and colon cancer   Social History:  reports that she has never smoked. She has never used smokeless tobacco. She reports that she does not drink alcohol or use illicit drugs.  Allergies:  Allergies  Allergen Reactions  . Imitrex [Sumatriptan]     Causes a migraine  . Percocet [Oxycodone-Acetaminophen] Other (See Comments)    Eyes dilate  . Cymbalta [Duloxetine Hcl] Rash  . Latex Rash  . Propoxyphene N-Acetaminophen Nausea And Vomiting and Rash  . Tramadol Rash    Medications Prior to  Admission  Medication Sig Dispense Refill  . gabapentin (NEURONTIN) 400 MG capsule Take 400 mg by mouth at bedtime.      No results found for this or any previous visit (from the past 48 hour(s)). No results found.  ROS  Blood pressure 91/79, pulse 69, temperature 98.6 F (37 C), temperature source Oral, resp. rate 22, height 5\' 4"  (1.626 m), weight 170 lb (77.111 kg), SpO2 98 %. Physical Exam  Constitutional: She appears well-developed and well-nourished.  HENT:  Mouth/Throat: Oropharynx is clear and moist.  Eyes: Conjunctivae are normal. No scleral icterus.  Neck: No thyromegaly present.  Cardiovascular: Normal rate, regular rhythm and  normal heart sounds.   No murmur heard. Respiratory: Effort normal and breath sounds normal.  GI: Soft. She exhibits no distension and no mass. There is no tenderness.  Musculoskeletal: She exhibits no edema.  Lymphadenopathy:    She has no cervical adenopathy.  Neurological: She is alert.  Skin: Skin is warm and dry.     Assessment/Plan Rectal bleeding. History of colonic adenomas and family history of CRC in second degree relative. Diagnostic colonoscopy.  Danija Gosa U 09/23/2015, 8:17 AM

## 2015-09-28 ENCOUNTER — Encounter (HOSPITAL_COMMUNITY): Payer: Self-pay | Admitting: Internal Medicine

## 2015-11-11 ENCOUNTER — Telehealth (INDEPENDENT_AMBULATORY_CARE_PROVIDER_SITE_OTHER): Payer: Self-pay | Admitting: *Deleted

## 2015-11-11 NOTE — Telephone Encounter (Signed)
Spoke with patient.

## 2015-11-11 NOTE — Telephone Encounter (Signed)
[name] - would you please review this and contact patient @ [phone].

## 2015-11-11 NOTE — Telephone Encounter (Signed)
Patient called and states that last night she had a large green stool , and this morning she had a small green stool. Denies any abdominal pain. She ask if this is something that she should be concerned about?

## 2015-12-26 ENCOUNTER — Ambulatory Visit (INDEPENDENT_AMBULATORY_CARE_PROVIDER_SITE_OTHER): Payer: BLUE CROSS/BLUE SHIELD | Admitting: Internal Medicine

## 2015-12-26 ENCOUNTER — Encounter (INDEPENDENT_AMBULATORY_CARE_PROVIDER_SITE_OTHER): Payer: Self-pay | Admitting: Internal Medicine

## 2015-12-26 VITALS — BP 110/72 | HR 64 | Temp 97.6°F | Ht 64.0 in | Wt 164.0 lb

## 2015-12-26 DIAGNOSIS — K219 Gastro-esophageal reflux disease without esophagitis: Secondary | ICD-10-CM

## 2015-12-26 DIAGNOSIS — K625 Hemorrhage of anus and rectum: Secondary | ICD-10-CM | POA: Diagnosis not present

## 2015-12-26 NOTE — Patient Instructions (Addendum)
Continue the Fiber One and Stool softener. OV in 1 year. Take OTC Zantac daily,.

## 2015-12-26 NOTE — Progress Notes (Signed)
Subjective:    Patient ID: [name], female    DOB: 08-15-1966, 50 y.o.   MRN: [id number]  HPI Here today for f/u after recent colonoscopy in October. She was started on Benefiber by Dr. Laural Golden after her colonoscopy.   Family hx of CRC in maternal grandmother.  Her stools have improved some. She has not seen any blood in over 2 weeks. She is using Fiber Choice x 2 and she uses a stool softener twice a day. She usually has a BM daily.  Her appetite is good.  She has gained 4 pounds since her last visit. She has abdominal pain with her BMs.       09/23/2015 Colonoscopy  Indications: Patient is 50 year old Caucasian female who underwent colonoscopy in May 2014 with removal of 4 small polyps and these are tubular adenomas. Patient now presents with frequent hematochezia. She passes fresh blood and clots. She says this occurs even when she is not constipated. Her bowels are irregular and at times she may go weeks without a bowel movement. Family history significant for CRC in maternal grandmother who died at 51 of postop complications.  Findings:  Prep excellent. Normal mucosa of terminal ileum. Single small diverticulum noted at proximal transverse colon otherwise mucosa of cecum ascending colon hepatic flexure, transverse colon and splenic flexure as well as descending and sigmoid colon was normal. Normal rectal mucosa. Small hemorrhoids below the dentate line along with focal erythema.   05/08/2013 Colonoscopy: Dr. Laural Golden: rectal bleeding: Impression:  Examination performed to cecum. Noncompliant sigmoid colon resulting in difficult exam. Four small polyps ablated via cold biopsy and submitted together(two at cecum, one at hepatic flexure and fourth one at sigmoid colon). External hemorrhoids. All 4 polyps or tubular adenomas. Results have been reviewed with patient. Next colonoscopy in 5 years under fluoroscopy.   Review of Systems Past Medical History  Diagnosis  Date  . Headache(784.0)     tension/migraines  . GERD (gastroesophageal reflux disease)     TUMS as needed  . Asthma     triggered by weather changes; no inhaler  . 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   . History of uterine cancer   . Stenosing tenosynovitis of finger 12/2012    left middle finger  . Ganglion cyst of wrist 12/2012    left dorsal cyst  . PONV (postoperative nausea and vomiting)     also states stopped breathing after hernia surgery  . Rheumatoid arthritis ([place])   . Fibromyalgia   . Rectal bleeding     Past Surgical History  Procedure Laterality Date  . Pubovaginal sling  2010  . Vagina reconstruction surgery      x 5 since 2010  . Abdominal hysterectomy  1994  . Ectopic pregnancy surgery  1995    left tube and ovary removed  . Appendectomy  1996  . Tubal ligation  1992  . Salpingoophorectomy  1996    right  . Ventral hernia repair  09/06/2006  . Cystoscopy with hydrodistension and biopsy  04/01/2007  . Trigger finger release  12/17/2012    Procedure: RELEASE TRIGGER FINGER/A-1 PULLEY;  Surgeon: Wynonia Sours, MD;  Location: [place];  Service: Orthopedics;  Laterality: Left;  . Ganglion cyst excision  12/17/2012    Procedure: REMOVAL GANGLION OF WRIST;  Surgeon: Wynonia Sours, MD;  Location: [place];  Service: Orthopedics;  Laterality: Left;  . Colonoscopy N/A 05/08/2013    Procedure: COLONOSCOPY;  Surgeon:  Rogene Houston, MD;  [facility];  Service: Endoscopy;  Laterality: N/A;  240  . Hardware removal Right 08/09/2014    Procedure: Placement Right Ankle Tight Rope, Removal Plate and Screws;  Surgeon: Marybelle Killings, MD;  Location: [place];  Service: Orthopedics;  Laterality: Right;  . Rt ankle surgery    . Colonoscopy N/A 09/23/2015    Procedure: COLONOSCOPY;  Surgeon: Rogene Houston, MD;  [facility];  Service: Endoscopy;  Laterality: N/A;  825    Allergies  Allergen Reactions  . Imitrex [Sumatriptan]       Causes a migraine  . Percocet [Oxycodone-Acetaminophen] Other (See Comments)    Eyes dilate  . Cymbalta [Duloxetine Hcl] Rash  . Latex Rash  . Propoxyphene N-Acetaminophen Nausea And Vomiting and Rash  . Tramadol Rash    Current Outpatient Prescriptions on File Prior to Visit  Medication Sig Dispense Refill  . gabapentin (NEURONTIN) 400 MG capsule Take 400 mg by mouth at bedtime.     No current facility-administered medications on file prior to visit.        Objective:   Physical Exam Blood pressure 110/72, pulse 64, temperature 97.6 F (36.4 C), height 5\' 4"  (1.626 m), weight 164 lb (74.39 kg). Alert and oriented. Skin warm and dry. Oral mucosa is moist.   . Sclera anicteric, conjunctivae is pink. Thyroid not enlarged. No cervical lymphadenopathy. Lungs clear. Heart regular rate and rhythm.  Abdomen is soft. Bowel sounds are positive. No hepatomegaly. No abdominal masses felt. Very slight epigastric tenderness.  No edema to lower extremities.        Assessment & Plan:  Constipation. Advised to take Stool softener, one in am and one in pm. Continue the Fiber Choice. OV in one  Year GERD: Try Zantacdaily for your GERD.

## 2016-01-12 ENCOUNTER — Encounter: Payer: Self-pay | Admitting: Obstetrics & Gynecology

## 2016-01-12 ENCOUNTER — Ambulatory Visit (INDEPENDENT_AMBULATORY_CARE_PROVIDER_SITE_OTHER): Payer: BLUE CROSS/BLUE SHIELD | Admitting: Obstetrics & Gynecology

## 2016-01-12 ENCOUNTER — Other Ambulatory Visit (HOSPITAL_COMMUNITY)
Admission: RE | Admit: 2016-01-12 | Discharge: 2016-01-12 | Disposition: A | Discharge status: Home / Self Care | Payer: BLUE CROSS/BLUE SHIELD | Source: Ambulatory Visit | Attending: Obstetrics & Gynecology | Admitting: Obstetrics & Gynecology

## 2016-01-12 VITALS — BP 110/70 | HR 72 | Ht 64.0 in | Wt 171.0 lb

## 2016-01-12 DIAGNOSIS — Z01419 Encounter for gynecological examination (general) (routine) without abnormal findings: Secondary | ICD-10-CM

## 2016-01-12 DIAGNOSIS — N952 Postmenopausal atrophic vaginitis: Secondary | ICD-10-CM

## 2016-01-12 DIAGNOSIS — Z01411 Encounter for gynecological examination (general) (routine) with abnormal findings: Secondary | ICD-10-CM

## 2016-01-12 DIAGNOSIS — R5383 Other fatigue: Secondary | ICD-10-CM

## 2016-01-12 MED ORDER — ESTROGENS, CONJUGATED 0.625 MG/GM VA CREA: 1.0000 | TOPICAL_CREAM | Freq: Every day | VAGINAL | Status: DC

## 2016-01-12 MED ORDER — ESTRADIOL 0.1 MG/24HR TD PTWK: 0.1000 mg | MEDICATED_PATCH | TRANSDERMAL | Status: DC

## 2016-01-12 NOTE — Progress Notes (Signed)
Patient ID: [name], female   DOB: Oct 01, 1966, 50 y.o.   MRN: [id number] Subjective:     [name] is a 50 y.o. female here for a routine exam.  No LMP recorded. Patient has had a hysterectomy. [id number] Birth Control Method:  hysterectomy Menstrual Calendar(currently): hysterectomy  Current complaints: Vaginal dryness with resulting discomfort with intercourse.   Current acute medical issues:  None   Recent Gynecologic History No LMP recorded. Patient has had a hysterectomy. Last Pap: Several years ago,  normal Last mammogram: 2014,  normal  Past Medical History  Diagnosis Date  . Headache(784.0)     tension/migraines  . GERD (gastroesophageal reflux disease)     TUMS as needed  . Asthma     triggered by weather changes; no inhaler  . 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   . History of uterine cancer   . Stenosing tenosynovitis of finger 12/2012    left middle finger  . Ganglion cyst of wrist 12/2012    left dorsal cyst  . PONV (postoperative nausea and vomiting)     also states stopped breathing after hernia surgery  . Rheumatoid arthritis ([place])   . Fibromyalgia   . Rectal bleeding     Past Surgical History  Procedure Laterality Date  . Pubovaginal sling  2010  . Vagina reconstruction surgery      x 5 since 2010  . Abdominal hysterectomy  1994  . Ectopic pregnancy surgery  1995    left tube and ovary removed  . Appendectomy  1996  . Tubal ligation  1992  . Salpingoophorectomy  1996    right  . Ventral hernia repair  09/06/2006  . Cystoscopy with hydrodistension and biopsy  04/01/2007  . Trigger finger release  12/17/2012    Procedure: RELEASE TRIGGER FINGER/A-1 PULLEY;  Surgeon: Wynonia Sours, MD;  Location: [place];  Service: Orthopedics;  Laterality: Left;  . Ganglion cyst excision  12/17/2012    Procedure: REMOVAL GANGLION OF WRIST;  Surgeon: Wynonia Sours, MD;  Location: [place];  Service: Orthopedics;  Laterality: Left;  .  Colonoscopy N/A 05/08/2013    Procedure: COLONOSCOPY;  Surgeon: Rogene Houston, MD;  [facility];  Service: Endoscopy;  Laterality: N/A;  240  . Hardware removal Right 08/09/2014    Procedure: Placement Right Ankle Tight Rope, Removal Plate and Screws;  Surgeon: Marybelle Killings, MD;  Location: [place];  Service: Orthopedics;  Laterality: Right;  . Rt ankle surgery    . Colonoscopy N/A 09/23/2015    Procedure: COLONOSCOPY;  Surgeon: Rogene Houston, MD;  [facility];  Service: Endoscopy;  Laterality: N/A;  825    OB History    Gravida Para Term Preterm AB TAB SAB Ectopic Multiple Living   3 2  2 1   1         Social History   Social History  . Marital Status: Married    Spouse Name: N/A  . Number of Children: N/A  . Years of Education: N/A   Social History Main Topics  . Smoking status: Never Smoker   . Smokeless tobacco: Never Used  . Alcohol Use: No  . Drug Use: No  . Sexual Activity: Yes    Birth Control/ Protection: Surgical   Other Topics Concern  . None   Social History Narrative    Family History  Problem Relation Age of Onset  . Lung cancer Mother   .  Cervical cancer Mother   . Heart disease Father   . Cancer Maternal Grandmother     bladder and colon cancer     Current outpatient prescriptions:  .  gabapentin (NEURONTIN) 400 MG capsule, Take 400 mg by mouth at bedtime., Disp: , Rfl:  .  Inulin (FIBER CHOICE PO), Take by mouth 2 (two) times daily., Disp: , Rfl:  .  conjugated estrogens (PREMARIN) vaginal cream, Place 1 Applicatorful vaginally daily., Disp: 30 g, Rfl: 12 .  estradiol (CLIMARA) 0.1 mg/24hr patch, Place 1 patch (0.1 mg total) onto the skin once a week., Disp: 4 patch, Rfl: 12  Review of Systems  Review of Systems  Constitutional: Negative for fever, chills, weight loss, malaise/fatigue and diaphoresis.  HENT: Negative for hearing loss, ear pain, nosebleeds, congestion, sore throat, neck pain, tinnitus and ear discharge.    Eyes: Negative for blurred vision, double vision, photophobia, pain, discharge and redness.  Respiratory: Negative for cough, hemoptysis, sputum production, shortness of breath, wheezing and stridor.   Cardiovascular: Negative for chest pain, palpitations, orthopnea, claudication, leg swelling and PND.  Gastrointestinal: negative for abdominal pain. Negative for heartburn, nausea, vomiting, diarrhea, constipation, blood in stool and melena.  Genitourinary: Negative for dysuria, urgency, frequency, hematuria and flank pain.  Musculoskeletal: Negative for myalgias, back pain, joint pain and falls.  Skin: Negative for itching and rash.  Neurological: Negative for dizziness, tingling, tremors, sensory change, speech change, focal weakness, seizures, loss of consciousness, weakness and headaches.  Endo/Heme/Allergies: Negative for environmental allergies and polydipsia. Does not bruise/bleed easily.  Psychiatric/Behavioral: Negative for depression, suicidal ideas, hallucinations, memory loss and substance abuse. The patient is not nervous/anxious and does not have insomnia.        Objective:  Blood pressure 110/70, pulse 72, height 5\' 4"  (1.626 m), weight 171 lb (77.565 kg).   Physical Exam  Vitals reviewed. Constitutional: She is oriented to person, place, and time. She appears well-developed and well-nourished.  HENT:  Head: Normocephalic and atraumatic.        Right Ear: External ear normal.  Left Ear: External ear normal.  Nose: Nose normal.  Mouth/Throat: Oropharynx is clear and moist.  Eyes: Conjunctivae and EOM are normal. Pupils are equal, round, and reactive to light. Right eye exhibits no discharge. Left eye exhibits no discharge. No scleral icterus.  Neck: Normal range of motion. Neck supple. No tracheal deviation present. No thyromegaly present.  Cardiovascular: Normal rate, regular rhythm, normal heart sounds and intact distal pulses.  Exam reveals no gallop and no friction rub.    No murmur heard. Respiratory: Effort normal and breath sounds normal. No respiratory distress. She has no wheezes. She has no rales. She exhibits no tenderness.  GI: Soft. Bowel sounds are normal. She exhibits no distension and no mass. There is no tenderness. There is no rebound and no guarding.  Genitourinary:  Breasts no masses skin changes or nipple changes bilaterally      Vulva is normal without lesions Vagina is pink moist without discharge Cervix absent  Uterus is absent Adnexa is negative with normal sized ovaries   Musculoskeletal: Normal range of motion. She exhibits no edema and no tenderness.  Neurological: She is alert and oriented to person, place, and time. She has normal reflexes. She displays normal reflexes. No cranial nerve deficit. She exhibits normal muscle tone. Coordination normal.  Skin: Skin is warm and dry. No rash noted. No erythema. No pallor.  Psychiatric: She has a normal mood and affect. Her behavior is  normal. Judgment and thought content normal.       Assessment:    Healthy female exam.      ICD-9-CM ICD-10-CM   1. Well woman exam with routine gynecological exam V72.31 Z01.419 Cytology - PAP  2. Lethargy 780.79 R53.83 Vitamin D (25 hydroxy)     TSH  3. Vaginal atrophy 627.3 N95.2      Plan:    Mammogram ordered. Follow up in: 1 year.     Meds ordered this encounter  Medications  . estradiol (CLIMARA) 0.1 mg/24hr patch    Sig: Place 1 patch (0.1 mg total) onto the skin once a week.    Dispense:  4 patch    Refill:  12  . conjugated estrogens (PREMARIN) vaginal cream    Sig: Place 1 Applicatorful vaginally daily.    Dispense:  30 g    Refill:  12   Orders Placed This Encounter  Procedures  . Vitamin D (25 hydroxy)  . TSH

## 2016-01-13 ENCOUNTER — Ambulatory Visit (HOSPITAL_COMMUNITY)
Admission: RE | Admit: 2016-01-13 | Discharge: 2016-01-13 | Disposition: A | Discharge status: Home / Self Care | Payer: BLUE CROSS/BLUE SHIELD | Source: Ambulatory Visit | Attending: Obstetrics & Gynecology | Admitting: Obstetrics & Gynecology

## 2016-01-13 DIAGNOSIS — Z1231 Encounter for screening mammogram for malignant neoplasm of breast: Secondary | ICD-10-CM | POA: Diagnosis present

## 2016-01-13 LAB — VITAMIN D 25 HYDROXY (VIT D DEFICIENCY, FRACTURES): Vit D, 25-Hydroxy: 20.9 ng/mL — ABNORMAL LOW (ref 30.0–100.0)

## 2016-01-13 LAB — TSH: TSH: 1.93 u[IU]/mL (ref 0.450–4.500)

## 2016-01-16 LAB — CYTOLOGY - PAP

## 2016-02-21 ENCOUNTER — Encounter: Payer: Self-pay | Admitting: Obstetrics & Gynecology

## 2016-02-21 ENCOUNTER — Ambulatory Visit (INDEPENDENT_AMBULATORY_CARE_PROVIDER_SITE_OTHER): Payer: BLUE CROSS/BLUE SHIELD | Admitting: Obstetrics & Gynecology

## 2016-02-21 VITALS — BP 120/80 | HR 72 | Ht 63.0 in | Wt 173.0 lb

## 2016-02-21 DIAGNOSIS — N952 Postmenopausal atrophic vaginitis: Secondary | ICD-10-CM | POA: Diagnosis not present

## 2016-02-21 DIAGNOSIS — E559 Vitamin D deficiency, unspecified: Secondary | ICD-10-CM

## 2016-02-21 DIAGNOSIS — Z90710 Acquired absence of both cervix and uterus: Secondary | ICD-10-CM | POA: Diagnosis not present

## 2016-02-21 MED ORDER — ESTRADIOL 1 MG/GM TD GEL: 1.0000 | Freq: Every day | TRANSDERMAL | Status: DC

## 2016-02-21 NOTE — Progress Notes (Signed)
Patient ID: [name], female   DOB: 1966-02-22, 50 y.o.   MRN: [id number]      Chief Complaint  Patient presents with  . Follow-up    vitamin D was low    Blood pressure 120/80, pulse 72, height 5\' 3"  (1.6 m), weight 173 lb (78.472 kg).  50 y.o. [id number] No LMP recorded. Patient has had a hysterectomy. The current method of family planning is status post hysterectomy.  Subjective Saw patient last month with a yearly exam and she was found to have pretty significant atrophic changes Was begun on Climara patches and some Premarin vaginal cream Patient states she is feeling better and her atrophy is also better  Objective No exam today  Pertinent ROS Per history of present illness otherwise negative  Labs or studies None    Impression Diagnoses this Encounter::   ICD-9-CM ICD-10-CM   1. Post-menopausal atrophic vaginitis 627.3 N95.2     Established relevant diagnosis(es):   Plan/Recommendations: Meds ordered this encounter  Medications  . DISCONTD: calcium citrate-vitamin D (CITRACAL+D) 315-200 MG-UNIT tablet    Sig: Take 2 tablets by mouth 2 (two) times daily.  . Vitamin D, Ergocalciferol, (DRISDOL) 50000 units CAPS capsule    Sig: Take by mouth every 7 (seven) days.  . Estradiol 1 MG/GM GEL    Sig: Place 1 packet onto the skin daily at 6 (six) AM.    Dispense:  30 g    Refill:  11    Labs or Scans Ordered: No orders of the defined types were placed in this encounter.    Management::   Follow up Return if symptoms worsen or fail to improve.        Face to face time:  10 minutes  Greater than 50% of the visit time was spent in counseling and coordination of care with the patient.  The summary and outline of the counseling and care coordination is summarized in the note above.   All questions were answered.

## 2016-03-01 ENCOUNTER — Telehealth: Payer: Self-pay | Admitting: Obstetrics & Gynecology

## 2016-03-01 NOTE — Telephone Encounter (Signed)
Pt states would like to get refill for estradiol patch until PA can be approved for the Divigel. Spoke with [facility] will refill the Estradiol patch for the pt per Dr.Eure verbal order.

## 2016-03-26 DIAGNOSIS — J029 Acute pharyngitis, unspecified: Secondary | ICD-10-CM | POA: Diagnosis not present

## 2016-03-26 DIAGNOSIS — Z6830 Body mass index (BMI) 30.0-30.9, adult: Secondary | ICD-10-CM | POA: Diagnosis not present

## 2016-05-23 ENCOUNTER — Other Ambulatory Visit: Payer: BLUE CROSS/BLUE SHIELD

## 2016-05-23 DIAGNOSIS — R7989 Other specified abnormal findings of blood chemistry: Secondary | ICD-10-CM | POA: Diagnosis not present

## 2016-05-24 LAB — VITAMIN D 25 HYDROXY (VIT D DEFICIENCY, FRACTURES): Vit D, 25-Hydroxy: 33.2 ng/mL (ref 30.0–100.0)

## 2016-05-28 ENCOUNTER — Telehealth: Payer: Self-pay | Admitting: Obstetrics & Gynecology

## 2016-05-28 NOTE — Telephone Encounter (Signed)
Pt informed of vit D results and she asked if she should stop taking the vit D since her level was normal.  Informed pt if she is taking to vit D to continue taking it.  Pt also states Dr. Elonda Husky had RX 'd Divigel back in January or February but she could not afford it so she was put on a patch instead but states she can now afford the gel and would like to switch back to it.  Pt states she would like to come in tomorrow to pick up a RX for it.  Advised pt I would route a message to Dr. Elonda Husky and if he is going to switch her back it could be E-scribed but if not I would call her back tomorrow.  Pt verbalized understanding.

## 2016-05-30 ENCOUNTER — Telehealth: Payer: Self-pay | Admitting: Obstetrics & Gynecology

## 2016-05-30 MED ORDER — ESTRADIOL 1 MG/GM TD GEL: 1.0000 | Freq: Every day | TRANSDERMAL | Status: DC

## 2016-05-30 NOTE — Telephone Encounter (Signed)
Pt informed not to stop Vit D and Estradiol gel faxed to pharmacy.

## 2016-06-04 ENCOUNTER — Telehealth: Payer: Self-pay | Admitting: Obstetrics & Gynecology

## 2016-06-04 NOTE — Telephone Encounter (Signed)
Called RX for Estradiol 1 mg/gm gel, 1 packet on the skin q morning at 6 am, 11 refills per order in chart on 05/30/2016. [facility] [place] [facility] Pharmacist. Pt informed.

## 2016-06-13 ENCOUNTER — Telehealth: Payer: Self-pay | Admitting: Obstetrics & Gynecology

## 2016-06-13 NOTE — Telephone Encounter (Signed)
Pt informed PA for Divigel approved, spoke with pharmacist and Divigel did go through that PA completed and approved. Pt will still have a $80.00 cost for RX. Pt verbalized understanding.

## 2016-06-29 DIAGNOSIS — H40013 Open angle with borderline findings, low risk, bilateral: Secondary | ICD-10-CM | POA: Diagnosis not present

## 2016-07-10 ENCOUNTER — Telehealth: Payer: Self-pay | Admitting: Obstetrics & Gynecology

## 2016-07-10 ENCOUNTER — Other Ambulatory Visit: Payer: Self-pay | Admitting: Obstetrics & Gynecology

## 2016-07-10 MED ORDER — ESTRADIOL 2 MG PO TABS: 2.0000 mg | ORAL_TABLET | Freq: Every day | ORAL | 11 refills | Status: DC

## 2016-07-10 NOTE — Telephone Encounter (Signed)
Pt states the Divigel is not helping continues to have hot flashes, please advise.

## 2016-07-10 NOTE — Telephone Encounter (Signed)
Pt informed to continue to use the Divigel as prescribed and Dr. Elonda Husky has added Estradiol 2 mg tablet daily to regimen for the hot flashes. [facility]. Pt verbalized understanding.

## 2016-10-16 ENCOUNTER — Other Ambulatory Visit: Payer: Self-pay | Admitting: *Deleted

## 2016-10-17 MED ORDER — ESTRADIOL 2 MG PO TABS: 2.0000 mg | ORAL_TABLET | Freq: Every day | ORAL | 3 refills | Status: DC

## 2016-11-06 ENCOUNTER — Encounter (INDEPENDENT_AMBULATORY_CARE_PROVIDER_SITE_OTHER): Payer: Self-pay

## 2016-11-06 ENCOUNTER — Encounter (INDEPENDENT_AMBULATORY_CARE_PROVIDER_SITE_OTHER): Payer: Self-pay | Admitting: Internal Medicine

## 2016-12-04 ENCOUNTER — Other Ambulatory Visit: Payer: Self-pay | Admitting: Obstetrics & Gynecology

## 2016-12-04 DIAGNOSIS — Z1231 Encounter for screening mammogram for malignant neoplasm of breast: Secondary | ICD-10-CM

## 2016-12-25 ENCOUNTER — Encounter (INDEPENDENT_AMBULATORY_CARE_PROVIDER_SITE_OTHER): Payer: Self-pay | Admitting: Internal Medicine

## 2016-12-25 ENCOUNTER — Ambulatory Visit (INDEPENDENT_AMBULATORY_CARE_PROVIDER_SITE_OTHER): Payer: BLUE CROSS/BLUE SHIELD | Admitting: Internal Medicine

## 2016-12-25 VITALS — BP 122/65 | HR 72 | Temp 97.6°F | Ht 64.0 in | Wt 177.2 lb

## 2016-12-25 DIAGNOSIS — K5909 Other constipation: Secondary | ICD-10-CM | POA: Diagnosis not present

## 2016-12-25 DIAGNOSIS — K219 Gastro-esophageal reflux disease without esophagitis: Secondary | ICD-10-CM

## 2016-12-25 MED ORDER — PANTOPRAZOLE SODIUM 40 MG PO TBEC: 40.0000 mg | DELAYED_RELEASE_TABLET | Freq: Every day | ORAL | 5 refills | Status: DC

## 2016-12-25 MED ORDER — GABAPENTIN 400 MG PO CAPS: 400.0000 mg | ORAL_CAPSULE | Freq: Every day | ORAL | 0 refills | Status: AC

## 2016-12-25 NOTE — Patient Instructions (Signed)
Rx for Protonix 40mg  daily Samples of Amitiza given to patient (24MCG).

## 2016-12-25 NOTE — Progress Notes (Addendum)
Subjective:    Patient ID: [name], female    DOB: 12/21/1965, 51 y.o.   MRN: [id number]  HPI Here today for f/u. Hx of GERD and constipation. She tells me she is doing good. She still has some constipation. She has a BM about once a week. She takes stool softeners every other day. She has acid reflux and takes Tums for this. She says she has had the acid reflux come up in her esophagus recently. Her appetite is good. She has gained about 13 pounds since her last visit.     09/23/2015 Colonoscopy  Indications: Patient is 51 year old Caucasian female who underwent colonoscopy in May 2014 with removal of 4 small polyps and these are tubular adenomas. Patient now presents with frequent hematochezia. She passes fresh blood and clots. She says this occurs even when she is not constipated. Her bowels are irregular and at times she may go weeks without a bowel movement. Family history significant for CRC in maternal grandmother who died at 30 of postop complications.  Findings:  Prep excellent. Normal mucosa of terminal ileum. Single small diverticulum noted at proximal transverse colon otherwise mucosa of cecum ascending colon hepatic flexure, transverse colon and splenic flexure as well as descending and sigmoid colon was normal. Normal rectal mucosa. Small hemorrhoids below the dentate line along with focal erythema.   05/08/2013 Colonoscopy: Dr. Laural Golden: rectal bleeding: Impression:  Examination performed to cecum. Noncompliant sigmoid colon resulting in difficult exam. Four small polyps ablated via cold biopsy and submitted together(two at cecum, one at hepatic flexure and fourth one at sigmoid colon). External hemorrhoids. All 4 polyps or tubular adenomas. Results have been reviewed with patient. Next colonoscopy in 5 years under fluoroscopy.    Review of Systems Past Medical History:  Diagnosis Date  . Asthma    triggered by weather changes; no inhaler  .  Fibromyalgia   . Ganglion cyst of wrist 12/2012   left dorsal cyst  . GERD (gastroesophageal reflux disease)    TUMS as needed  . Headache(784.0)    tension/migraines  . History of uterine cancer   . PONV (postoperative nausea and vomiting)    also states stopped breathing after hernia surgery  . Rectal bleeding   . Rheumatoid arthritis ([place])   . Stenosing tenosynovitis of finger 12/2012   left middle finger  . 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 ABDOMINAL HYSTERECTOMY  1994  . APPENDECTOMY  1996  . COLONOSCOPY N/A 05/08/2013   Procedure: COLONOSCOPY;  Surgeon: Rogene Houston, MD;  [facility];  Service: Endoscopy;  Laterality: N/A;  240  . COLONOSCOPY N/A 09/23/2015   Procedure: COLONOSCOPY;  Surgeon: Rogene Houston, MD;  [facility];  Service: Endoscopy;  Laterality: N/A;  825  . CYSTOSCOPY WITH HYDRODISTENSION AND BIOPSY  04/01/2007  . [place]   left tube and ovary removed  . GANGLION CYST EXCISION  12/17/2012   Procedure: REMOVAL GANGLION OF WRIST;  Surgeon: Wynonia Sours, MD;  Location: [place];  Service: Orthopedics;  Laterality: Left;  . HARDWARE REMOVAL Right 08/09/2014   Procedure: Placement Right Ankle Tight Rope, Removal Plate and Screws;  Surgeon: Marybelle Killings, MD;  Location: [place];  Service: Orthopedics;  Laterality: Right;  . PUBOVAGINAL SLING  2010  . rt ankle surgery    . SALPINGOOPHORECTOMY  1996   right  .  TRIGGER FINGER RELEASE  12/17/2012   Procedure: RELEASE TRIGGER FINGER/A-1 PULLEY;  Surgeon: Wynonia Sours, MD;  Location: [place];  Service: Orthopedics;  Laterality: Left;  . TUBAL LIGATION  1992  . VAGINA RECONSTRUCTION SURGERY     x 5 since 2010  . VENTRAL HERNIA REPAIR  09/06/2006    Allergies  Allergen Reactions  . Imitrex [Sumatriptan]     Causes a migraine  . Percocet [Oxycodone-Acetaminophen] Other (See  Comments)    Eyes dilate  . Cymbalta [Duloxetine Hcl] Rash  . Latex Rash  . Propoxyphene N-Acetaminophen Nausea And Vomiting and Rash  . Tramadol Rash    Current Outpatient Prescriptions on File Prior to Visit  Medication Sig Dispense Refill  . conjugated estrogens (PREMARIN) vaginal cream Place 1 Applicatorful vaginally daily. 30 g 12  . estradiol (ESTRACE) 2 MG tablet Take 1 tablet (2 mg total) by mouth daily. 30 tablet 11  . Inulin (FIBER CHOICE PO) Take by mouth 2 (two) times daily.    . Vitamin D, Ergocalciferol, (DRISDOL) 50000 units CAPS capsule Take 5,000 Units by mouth daily.      No current facility-administered medications on file prior to visit.        Objective:   Physical Exam Blood pressure 122/65, pulse 72, temperature 97.6 F (36.4 C), height 5\' 4"  (1.626 m), weight 177 lb 3.2 oz (80.4 kg). Alert and oriented. Skin warm and dry. Oral mucosa is moist.   . Sclera anicteric, conjunctivae is pink. Thyroid not enlarged. No cervical lymphadenopathy. Lungs clear. Heart regular rate and rhythm.  Abdomen is soft. Bowel sounds are positive. No hepatomegaly. No abdominal masses felt. No tenderness.  No edema to lower extremities.          Assessment & Plan:  GERD. Am going to start her on Protonix for her GERD. Am going to give her samples of Amitiza  8mcg BID OV in 1 year. Also am going to refill her Gabapentin x 1 till she can see her PCP (sl;eep) OV in year.

## 2016-12-31 ENCOUNTER — Encounter (INDEPENDENT_AMBULATORY_CARE_PROVIDER_SITE_OTHER): Payer: Self-pay | Admitting: Internal Medicine

## 2017-01-10 DIAGNOSIS — Z6832 Body mass index (BMI) 32.0-32.9, adult: Secondary | ICD-10-CM | POA: Diagnosis not present

## 2017-01-10 DIAGNOSIS — J111 Influenza due to unidentified influenza virus with other respiratory manifestations: Secondary | ICD-10-CM | POA: Diagnosis not present

## 2017-01-14 ENCOUNTER — Ambulatory Visit (HOSPITAL_COMMUNITY)
Admission: RE | Admit: 2017-01-14 | Discharge: 2017-01-14 | Disposition: A | Discharge status: Home / Self Care | Payer: BLUE CROSS/BLUE SHIELD | Source: Ambulatory Visit | Attending: Obstetrics & Gynecology | Admitting: Obstetrics & Gynecology

## 2017-01-14 ENCOUNTER — Encounter (HOSPITAL_COMMUNITY): Payer: Self-pay

## 2017-01-14 DIAGNOSIS — Z1231 Encounter for screening mammogram for malignant neoplasm of breast: Secondary | ICD-10-CM

## 2017-01-14 HISTORY — DX: Malignant (primary) neoplasm, unspecified: C80.1

## 2017-01-17 ENCOUNTER — Ambulatory Visit (INDEPENDENT_AMBULATORY_CARE_PROVIDER_SITE_OTHER): Payer: BLUE CROSS/BLUE SHIELD | Admitting: Obstetrics & Gynecology

## 2017-01-17 ENCOUNTER — Encounter: Payer: Self-pay | Admitting: Obstetrics & Gynecology

## 2017-01-17 VITALS — BP 120/80 | HR 82 | Ht 63.0 in | Wt 181.0 lb

## 2017-01-17 DIAGNOSIS — Z01419 Encounter for gynecological examination (general) (routine) without abnormal findings: Secondary | ICD-10-CM

## 2017-01-17 MED ORDER — ESTROGENS, CONJUGATED 0.625 MG/GM VA CREA: 1.0000 | TOPICAL_CREAM | Freq: Every day | VAGINAL | 12 refills | Status: DC

## 2017-01-17 MED ORDER — ESTRADIOL 2 MG PO TABS: 2.0000 mg | ORAL_TABLET | Freq: Every day | ORAL | 11 refills | Status: DC

## 2017-01-17 NOTE — Progress Notes (Signed)
Subjective:     [name] is a 51 y.o. female here for a routine exam.  No LMP recorded. Patient has had a hysterectomy. [id number] Birth Control Method:  hysterectomy Menstrual Calendar(currently): none  Current complaints: nails.   Current acute medical issues:     Recent Gynecologic History No LMP recorded. Patient has had a hysterectomy. Last Pap: ,   Last mammogram: 2018,  normal  Past Medical History:  Diagnosis Date  . Asthma    triggered by weather changes; no inhaler  . Cancer ([place])   . Fibromyalgia   . Ganglion cyst of wrist 12/2012   left dorsal cyst  . GERD (gastroesophageal reflux disease)    TUMS as needed  . Headache(784.0)    tension/migraines  . PONV (postoperative nausea and vomiting)    also states stopped breathing after hernia surgery  . Rectal bleeding   . Rheumatoid arthritis ([place])   . Stenosing tenosynovitis of finger 12/2012   left middle finger  . 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 ABDOMINAL HYSTERECTOMY  1994  . APPENDECTOMY  1996  . COLONOSCOPY N/A 05/08/2013   Procedure: COLONOSCOPY;  Surgeon: Rogene Houston, MD;  [facility];  Service: Endoscopy;  Laterality: N/A;  240  . COLONOSCOPY N/A 09/23/2015   Procedure: COLONOSCOPY;  Surgeon: Rogene Houston, MD;  [facility];  Service: Endoscopy;  Laterality: N/A;  825  . CYSTOSCOPY WITH HYDRODISTENSION AND BIOPSY  04/01/2007  . [place]   left tube and ovary removed  . GANGLION CYST EXCISION  12/17/2012   Procedure: REMOVAL GANGLION OF WRIST;  Surgeon: Wynonia Sours, MD;  Location: [place];  Service: Orthopedics;  Laterality: Left;  . HARDWARE REMOVAL Right 08/09/2014   Procedure: Placement Right Ankle Tight Rope, Removal Plate and Screws;  Surgeon: Marybelle Killings, MD;  Location: [place];  Service: Orthopedics;  Laterality: Right;  . PUBOVAGINAL SLING  2010  . rt ankle  surgery    . SALPINGOOPHORECTOMY  1996   right  . TRIGGER FINGER RELEASE  12/17/2012   Procedure: RELEASE TRIGGER FINGER/A-1 PULLEY;  Surgeon: Wynonia Sours, MD;  Location: [place];  Service: Orthopedics;  Laterality: Left;  . TUBAL LIGATION  1992  . VAGINA RECONSTRUCTION SURGERY     x 5 since 2010  . VENTRAL HERNIA REPAIR  09/06/2006    OB History    Gravida Para Term Preterm AB Living   3 2   2 1      SAB TAB Ectopic Multiple Live Births       1          Social History   Social History  . Marital status: Married    Spouse name: N/A  . Number of children: N/A  . Years of education: N/A   Social History Main Topics  . Smoking status: Never Smoker  . Smokeless tobacco: Never Used  . Alcohol use No  . Drug use: No  . Sexual activity: Yes    Birth control/ protection: Surgical   Other Topics Concern  . None   Social History Narrative  . None    Family History  Problem Relation Age of Onset  . Lung cancer Mother   . Cervical cancer Mother   . Heart disease Father   . Leukemia Father   . Cancer Maternal Grandmother  bladder and colon cancer     Current Outpatient Prescriptions:  .  conjugated estrogens (PREMARIN) vaginal cream, Place 1 Applicatorful vaginally daily., Disp: 30 g, Rfl: 12 .  estradiol (ESTRACE) 2 MG tablet, Take 1 tablet (2 mg total) by mouth daily., Disp: 30 tablet, Rfl: 11 .  gabapentin (NEURONTIN) 400 MG capsule, Take 1 capsule (400 mg total) by mouth at bedtime., Disp: 90 capsule, Rfl: 0 .  Inulin (FIBER CHOICE PO), Take by mouth 2 (two) times daily., Disp: , Rfl:  .  pantoprazole (PROTONIX) 40 MG tablet, Take 1 tablet (40 mg total) by mouth daily., Disp: 30 tablet, Rfl: 5 .  Vitamin D, Ergocalciferol, (DRISDOL) 50000 units CAPS capsule, Take 5,000 Units by mouth daily. , Disp: , Rfl:   Review of Systems  Review of Systems  Constitutional: Negative for fever, chills, weight loss, malaise/fatigue and diaphoresis.  HENT:  Negative for hearing loss, ear pain, nosebleeds, congestion, sore throat, neck pain, tinnitus and ear discharge.   Eyes: Negative for blurred vision, double vision, photophobia, pain, discharge and redness.  Respiratory: Negative for cough, hemoptysis, sputum production, shortness of breath, wheezing and stridor.   Cardiovascular: Negative for chest pain, palpitations, orthopnea, claudication, leg swelling and PND.  Gastrointestinal: negative for abdominal pain. Negative for heartburn, nausea, vomiting, diarrhea, constipation, blood in stool and melena.  Genitourinary: Negative for dysuria, urgency, frequency, hematuria and flank pain.  Musculoskeletal: Negative for myalgias, back pain, joint pain and falls.  Skin: Negative for itching and rash.  Neurological: Negative for dizziness, tingling, tremors, sensory change, speech change, focal weakness, seizures, loss of consciousness, weakness and headaches.  Endo/Heme/Allergies: Negative for environmental allergies and polydipsia. Does not bruise/bleed easily.  Psychiatric/Behavioral: Negative for depression, suicidal ideas, hallucinations, memory loss and substance abuse. The patient is not nervous/anxious and does not have insomnia.        Objective:  Blood pressure 120/80, pulse 82, height 5\' 3"  (1.6 m), weight 181 lb (82.1 kg).   Physical Exam  Vitals reviewed. Constitutional: She is oriented to person, place, and time. She appears well-developed and well-nourished.  HENT:  Head: Normocephalic and atraumatic.        Right Ear: External ear normal.  Left Ear: External ear normal.  Nose: Nose normal.  Mouth/Throat: Oropharynx is clear and moist.  Eyes: Conjunctivae and EOM are normal. Pupils are equal, round, and reactive to light. Right eye exhibits no discharge. Left eye exhibits no discharge. No scleral icterus.  Neck: Normal range of motion. Neck supple. No tracheal deviation present. No thyromegaly present.  Cardiovascular: Normal  rate, regular rhythm, normal heart sounds and intact distal pulses.  Exam reveals no gallop and no friction rub.   No murmur heard. Respiratory: Effort normal and breath sounds normal. No respiratory distress. She has no wheezes. She has no rales. She exhibits no tenderness.  GI: Soft. Bowel sounds are normal. She exhibits no distension and no mass. There is no tenderness. There is no rebound and no guarding.  Genitourinary:  Breasts no masses skin changes or nipple changes bilaterally      Vulva is normal without lesions Vagina is pink moist without discharge Cervix normal in appearance and pap is done Uterus is normal size shape and contour Adnexa is negative with normal sized ovaries   Musculoskeletal: Normal range of motion. She exhibits no edema and no tenderness.  Neurological: She is alert and oriented to person, place, and time. She has normal reflexes. She displays normal reflexes. No cranial nerve deficit.  She exhibits normal muscle tone. Coordination normal.  Skin: Skin is warm and dry. No rash noted. No erythema. No pallor.  Psychiatric: She has a normal mood and affect. Her behavior is normal. Judgment and thought content normal.       Medications Ordered at today's visit: Meds ordered this encounter  Medications  . estradiol (ESTRACE) 2 MG tablet    Sig: Take 1 tablet (2 mg total) by mouth daily.    Dispense:  30 tablet    Refill:  11  . conjugated estrogens (PREMARIN) vaginal cream    Sig: Place 1 Applicatorful vaginally daily.    Dispense:  30 g    Refill:  12    Other orders placed at today's visit: No orders of the defined types were placed in this encounter.     Assessment:    Healthy female exam.    Plan:    Hormone replacement therapy: hormone replacement therapy: oral and vaginal estrgoen. Follow up in: 1 year.     No Follow-up on file.

## 2017-03-14 ENCOUNTER — Emergency Department (HOSPITAL_COMMUNITY)
Admission: EM | Admit: 2017-03-14 | Discharge: 2017-03-14 | Disposition: A | Discharge status: Home / Self Care | Payer: BLUE CROSS/BLUE SHIELD | Attending: Emergency Medicine | Admitting: Emergency Medicine

## 2017-03-14 ENCOUNTER — Emergency Department (HOSPITAL_COMMUNITY): Payer: BLUE CROSS/BLUE SHIELD

## 2017-03-14 DIAGNOSIS — M5441 Lumbago with sciatica, right side: Secondary | ICD-10-CM | POA: Diagnosis not present

## 2017-03-14 DIAGNOSIS — Z79899 Other long term (current) drug therapy: Secondary | ICD-10-CM | POA: Diagnosis not present

## 2017-03-14 DIAGNOSIS — J45909 Unspecified asthma, uncomplicated: Secondary | ICD-10-CM | POA: Diagnosis not present

## 2017-03-14 DIAGNOSIS — Z859 Personal history of malignant neoplasm, unspecified: Secondary | ICD-10-CM | POA: Insufficient documentation

## 2017-03-14 DIAGNOSIS — M545 Low back pain: Secondary | ICD-10-CM | POA: Diagnosis not present

## 2017-03-14 LAB — URINALYSIS, ROUTINE W REFLEX MICROSCOPIC
Bilirubin Urine: NEGATIVE
Glucose, UA: NEGATIVE mg/dL
Ketones, ur: NEGATIVE mg/dL
Leukocytes, UA: NEGATIVE
Nitrite: NEGATIVE
Protein, ur: NEGATIVE mg/dL
Specific Gravity, Urine: 1.023 (ref 1.005–1.030)
pH: 5 (ref 5.0–8.0)

## 2017-03-14 MED ORDER — METHOCARBAMOL 500 MG PO TABS: 500.0000 mg | ORAL_TABLET | Freq: Three times a day (TID) | ORAL | 0 refills | Status: DC

## 2017-03-14 MED ORDER — NAPROXEN 500 MG PO TABS: 500.0000 mg | ORAL_TABLET | Freq: Two times a day (BID) | ORAL | 0 refills | Status: DC

## 2017-03-14 MED ORDER — PREDNISONE 10 MG PO TABS: ORAL_TABLET | ORAL | 0 refills | Status: DC

## 2017-03-14 MED ORDER — KETOROLAC TROMETHAMINE 60 MG/2ML IM SOLN
60.0000 mg | Freq: Once | INTRAMUSCULAR | Status: AC
  Administered 2017-03-14: 60 mg via INTRAMUSCULAR
  Filled 2017-03-14: qty 2

## 2017-03-14 NOTE — Discharge Instructions (Signed)
Apply ice packs on/off to your back.  Follow-up with your primary doctor for recheck in one week if the symptoms are not improving after the medication prescribed.

## 2017-03-14 NOTE — ED Provider Notes (Signed)
[facility] DEPT Provider Note   CSN: [id number] Arrival date & time: 03/14/17  0254     History   Chief Complaint Chief Complaint  Patient presents with  . Back Pain    HPI [name] is a 51 y.o. female.  HPI   [name] is a 51 y.o. female who presents to the Emergency Department complaining of right sided low back pain for three weeks.  She states the pain began after a mechanical fall down several steps of her home.  She describes a throbbing pain to her lower back that now radiates into her right buttock and leg for several days.  Pain is somewhat relieved with certain positions and worse with walking, standing and excessive sitting.  She has tried OTC pain relievers without improvement.  She denies abdominal pain, urine or bowel changes, swelling, numbness or weakness of the lower extremities.    Past Medical History:  Diagnosis Date  . Asthma    triggered by weather changes; no inhaler  . Cancer ([place])   . Fibromyalgia   . Ganglion cyst of wrist 12/2012   left dorsal cyst  . GERD (gastroesophageal reflux disease)    TUMS as needed  . Headache(784.0)    tension/migraines  . PONV (postoperative nausea and vomiting)    also states stopped breathing after hernia surgery  . Rectal bleeding   . Rheumatoid arthritis ([place])   . Stenosing tenosynovitis of finger 12/2012   left middle finger  . 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 Stiffness of joint, not elsewhere classified, ankle and foot 09/07/2014  . Pain in joint, ankle and foot 09/07/2014  . Difficulty in walking(719.7) 09/07/2014  . 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 Hematochezia 08/11/2013  . Constipation 08/11/2013  . RHEUMATOID ARTHRITIS 02/20/2010  . KNEE PAIN 02/20/2010  . FIBROMYALGIA 02/20/2010    Past Surgical History:  Procedure Laterality Date  . ABDOMINAL HYSTERECTOMY  1994  . APPENDECTOMY  1996  . COLONOSCOPY N/A  05/08/2013   Procedure: COLONOSCOPY;  Surgeon: Rogene Houston, MD;  [facility];  Service: Endoscopy;  Laterality: N/A;  240  . COLONOSCOPY N/A 09/23/2015   Procedure: COLONOSCOPY;  Surgeon: Rogene Houston, MD;  [facility];  Service: Endoscopy;  Laterality: N/A;  825  . CYSTOSCOPY WITH HYDRODISTENSION AND BIOPSY  04/01/2007  . [place]   left tube and ovary removed  . GANGLION CYST EXCISION  12/17/2012   Procedure: REMOVAL GANGLION OF WRIST;  Surgeon: Wynonia Sours, MD;  Location: [place];  Service: Orthopedics;  Laterality: Left;  . HARDWARE REMOVAL Right 08/09/2014   Procedure: Placement Right Ankle Tight Rope, Removal Plate and Screws;  Surgeon: Marybelle Killings, MD;  Location: [place];  Service: Orthopedics;  Laterality: Right;  . PUBOVAGINAL SLING  2010  . rt ankle surgery    . SALPINGOOPHORECTOMY  1996   right  . TRIGGER FINGER RELEASE  12/17/2012   Procedure: RELEASE TRIGGER FINGER/A-1 PULLEY;  Surgeon: Wynonia Sours, MD;  Location: [place];  Service: Orthopedics;  Laterality: Left;  . TUBAL LIGATION  1992  . VAGINA RECONSTRUCTION SURGERY     x 5 since 2010  . VENTRAL HERNIA REPAIR  09/06/2006    OB History    Gravida Para Term Preterm AB Living   3 2   2  1  SAB TAB Ectopic Multiple Live Births       1           Home Medications    Prior to Admission medications   Medication Sig Start Date End Date Taking? Authorizing Provider  conjugated estrogens (PREMARIN) vaginal cream Place 1 Applicatorful vaginally daily. 01/17/17   Florian Buff, MD  estradiol (ESTRACE) 2 MG tablet Take 1 tablet (2 mg total) by mouth daily. 01/17/17   Florian Buff, MD  gabapentin (NEURONTIN) 400 MG capsule Take 1 capsule (400 mg total) by mouth at bedtime. 12/25/16   Butch Penny, NP  Inulin (FIBER CHOICE PO) Take by mouth 2 (two) times daily.    Historical Provider, MD  pantoprazole (PROTONIX) 40 MG tablet Take 1 tablet (40  mg total) by mouth daily. 12/25/16   Butch Penny, NP  Vitamin D, Ergocalciferol, (DRISDOL) 50000 units CAPS capsule Take 5,000 Units by mouth daily.     Historical Provider, MD    Family History Family History  Problem Relation Age of Onset  . Lung cancer Mother   . Cervical cancer Mother   . Heart disease Father   . Leukemia Father   . Cancer Maternal Grandmother     bladder and colon cancer    Social History Social History  Substance Use Topics  . Smoking status: Never Smoker  . Smokeless tobacco: Never Used  . Alcohol use No     Allergies   Imitrex [sumatriptan]; Percocet [oxycodone-acetaminophen]; Cymbalta [duloxetine hcl]; Latex; Propoxyphene n-acetaminophen; and Tramadol   Review of Systems Review of Systems  Constitutional: Negative for fever.  Respiratory: Negative for shortness of breath.   Gastrointestinal: Negative for abdominal pain, constipation and vomiting.  Genitourinary: Negative for decreased urine volume, difficulty urinating, dysuria, flank pain and hematuria.  Musculoskeletal: Positive for back pain. Negative for joint swelling.  Skin: Negative for rash.  Neurological: Negative for weakness and numbness.  All other systems reviewed and are negative.    Physical Exam Updated Vital Signs BP 123/77 (BP Location: Left Arm)   Pulse 72   Temp 98.1 F (36.7 C) (Oral)   Resp 16   SpO2 97%   Physical Exam  Constitutional: She is oriented to person, place, and time. She appears well-developed and well-nourished. No distress.  HENT:  Head: Normocephalic and atraumatic.  Neck: Normal range of motion. Neck supple.  Cardiovascular: Normal rate, regular rhythm and intact distal pulses.   No murmur heard. DP pulses are brisk and symmetrical.  Pulmonary/Chest: Effort normal and breath sounds normal. No respiratory distress.  Abdominal: Soft. She exhibits no distension. There is no tenderness.  Musculoskeletal: She exhibits tenderness. She exhibits no  edema.       Lumbar back: She exhibits tenderness and pain. She exhibits normal range of motion, no swelling, no deformity, no laceration and normal pulse.  ttp of the right lower lumbar spine, right paraspinal muscles and right SI joint space.    Distal sensation intact.  Pt has 5/5 strength against resistance of bilateral lower extremities.     Neurological: She is alert and oriented to person, place, and time. She has normal strength. No sensory deficit. She exhibits normal muscle tone. Coordination and gait normal.  Reflex Scores:      Patellar reflexes are 2+ on the right side and 2+ on the left side.      Achilles reflexes are 2+ on the right side and 2+ on the left side. Skin: Skin is warm and  dry. Capillary refill takes less than 2 seconds. No rash noted.  Nursing note and vitals reviewed.    ED Treatments / Results  Labs (all labs ordered are listed, but only abnormal results are displayed) Labs Reviewed  URINALYSIS, ROUTINE W REFLEX MICROSCOPIC - Abnormal; Notable for the following:       Result Value   Hgb urine dipstick SMALL (*)    Bacteria, UA RARE (*)    Squamous Epithelial / LPF 0-5 (*)    All other components within normal limits  URINE CULTURE    EKG  EKG Interpretation None       Radiology Dg Lumbar Spine Complete  Result Date: 03/14/2017 CLINICAL DATA:  Right-sided low back pain radiating down the right leg, fell 2 weeks ago EXAM: LUMBAR SPINE - COMPLETE 4+ VIEW COMPARISON:  None. FINDINGS: The lumbar vertebrae are in normal alignment. Intervertebral disc spaces appear normal. No compression deformity is seen. The SI joints are corticated. Surgical clips are present in the pelvis and in the right upper quadrant. IMPRESSION: Normal alignment of the lumbar vertebrae. Normal intervertebral disc spaces. Electronically Signed   By: Ivar Drape M.D.   On: 03/14/2017 08:51    Procedures Procedures (including critical care time)  Medications Ordered in  ED Medications - No data to display   Initial Impression / Assessment and Plan / ED Course  I have reviewed the triage vital signs and the nursing notes.  Pertinent labs & imaging results that were available during my care of the patient were reviewed by me and considered in my medical decision making (see chart for details).     Patient is well-appearing. Vital signs are stable. She ambulates with a steady gait. X-rays are negative for fracture. Symptoms are likely consistent with sciatica. No focal neuro deficits on exam. No concerning symptoms for emergent neurological process. Patient agrees to treatment plan which includes anti-inflammatory, prednisone and muscle relaxer. She also agrees to follow-up with her primary doctor if her symptoms are not improving, discussed possible disc injury.  Final Clinical Impressions(s) / ED Diagnoses   Final diagnoses:  Acute right-sided low back pain with right-sided sciatica    New Prescriptions New Prescriptions   No medications on file     Kem Parkinson, PA-C 03/14/17 0957    Noemi Chapel, MD 03/15/17 [phone]

## 2017-03-14 NOTE — ED Triage Notes (Signed)
Fell down steps 3 weeks ago. Pt. Tried to treat this at home with tylenol but pain was not relieved. Pain in lower back travels down to right knee.

## 2017-03-14 NOTE — ED Notes (Signed)
Pt made aware to return if symptoms worsen or if any life threatening symptoms occur.   

## 2017-03-15 LAB — URINE CULTURE: Culture: <10000 — AB

## 2017-04-09 ENCOUNTER — Other Ambulatory Visit: Payer: Self-pay | Admitting: Obstetrics & Gynecology

## 2017-04-09 ENCOUNTER — Encounter: Payer: Self-pay | Admitting: Obstetrics & Gynecology

## 2017-04-09 DIAGNOSIS — E569 Vitamin deficiency, unspecified: Secondary | ICD-10-CM

## 2017-04-10 DIAGNOSIS — E569 Vitamin deficiency, unspecified: Secondary | ICD-10-CM | POA: Diagnosis not present

## 2017-04-11 LAB — VITAMIN B12: Vitamin B-12: 346 pg/mL (ref 232–1245)

## 2017-04-12 ENCOUNTER — Encounter (INDEPENDENT_AMBULATORY_CARE_PROVIDER_SITE_OTHER): Payer: Self-pay | Admitting: Internal Medicine

## 2017-04-12 LAB — VITAMIN B7: Vitamin B7: 1.72 ng/mL — ABNORMAL HIGH (ref 0.05–0.83)

## 2017-04-13 LAB — VITAMIN B6: Vitamin B6: 9.1 ug/L (ref 2.0–32.8)

## 2017-04-14 LAB — VITAMIN A: Vitamin A: 40.6 ug/dL (ref 33.1–100.0)

## 2017-04-14 LAB — VITAMIN D 1,25 DIHYDROXY
Vitamin D 1, 25 (OH)2 Total: 58 pg/mL
Vitamin D2 1, 25 (OH)2: <10 pg/mL
Vitamin D3 1, 25 (OH)2: 58 pg/mL

## 2017-04-15 ENCOUNTER — Telehealth (INDEPENDENT_AMBULATORY_CARE_PROVIDER_SITE_OTHER): Payer: Self-pay | Admitting: Internal Medicine

## 2017-04-15 DIAGNOSIS — R197 Diarrhea, unspecified: Secondary | ICD-10-CM

## 2017-04-15 NOTE — Telephone Encounter (Signed)
GI pathogen ordered 

## 2017-04-17 ENCOUNTER — Encounter: Payer: Self-pay | Admitting: Obstetrics & Gynecology

## 2017-04-25 ENCOUNTER — Encounter: Payer: Self-pay | Admitting: Obstetrics & Gynecology

## 2017-06-08 ENCOUNTER — Emergency Department (HOSPITAL_COMMUNITY)
Admission: EM | Admit: 2017-06-08 | Discharge: 2017-06-08 | Disposition: A | Discharge status: Home / Self Care | Payer: BLUE CROSS/BLUE SHIELD | Attending: Emergency Medicine | Admitting: Emergency Medicine

## 2017-06-08 ENCOUNTER — Encounter (HOSPITAL_COMMUNITY): Payer: Self-pay | Admitting: Emergency Medicine

## 2017-06-08 DIAGNOSIS — Z791 Long term (current) use of non-steroidal anti-inflammatories (NSAID): Secondary | ICD-10-CM | POA: Insufficient documentation

## 2017-06-08 DIAGNOSIS — J45909 Unspecified asthma, uncomplicated: Secondary | ICD-10-CM | POA: Insufficient documentation

## 2017-06-08 DIAGNOSIS — Z79899 Other long term (current) drug therapy: Secondary | ICD-10-CM | POA: Insufficient documentation

## 2017-06-08 DIAGNOSIS — G43809 Other migraine, not intractable, without status migrainosus: Secondary | ICD-10-CM

## 2017-06-08 DIAGNOSIS — R51 Headache: Secondary | ICD-10-CM | POA: Diagnosis not present

## 2017-06-08 DIAGNOSIS — Z91040 Latex allergy status: Secondary | ICD-10-CM | POA: Insufficient documentation

## 2017-06-08 MED ORDER — ACETAMINOPHEN 325 MG PO TABS
650.0000 mg | ORAL_TABLET | Freq: Once | ORAL | Status: AC
  Administered 2017-06-08: 650 mg via ORAL
  Filled 2017-06-08: qty 2

## 2017-06-08 MED ORDER — METOCLOPRAMIDE HCL 5 MG/ML IJ SOLN
10.0000 mg | Freq: Once | INTRAMUSCULAR | Status: AC
  Administered 2017-06-08: 10 mg via INTRAMUSCULAR
  Filled 2017-06-08: qty 2

## 2017-06-08 MED ORDER — DIPHENHYDRAMINE HCL 50 MG/ML IJ SOLN
50.0000 mg | Freq: Once | INTRAMUSCULAR | Status: AC
  Administered 2017-06-08: 50 mg via INTRAMUSCULAR
  Filled 2017-06-08: qty 1

## 2017-06-08 MED ORDER — KETOROLAC TROMETHAMINE 60 MG/2ML IM SOLN
60.0000 mg | Freq: Once | INTRAMUSCULAR | Status: AC
  Administered 2017-06-08: 60 mg via INTRAMUSCULAR
  Filled 2017-06-08: qty 2

## 2017-06-08 MED ORDER — METOCLOPRAMIDE HCL 10 MG PO TABS: 10.0000 mg | ORAL_TABLET | Freq: Four times a day (QID) | ORAL | 0 refills | Status: DC | PRN

## 2017-06-08 NOTE — ED Provider Notes (Signed)
[facility] DEPT Provider Note   CSN: [id number] Arrival date & time: 06/08/17  0802     History   Chief Complaint Chief Complaint  Patient presents with  . Headache    HPI [name] is a 51 y.o. female.  HPI  Pt was seen at [facility]. Per pt, c/o gradual onset and persistence of constant acute flair of her chronic migraine headache for the past 3 days.  Has been associated with one episode of N/V. Describes the headache as per her usual chronic migraine headache pain pattern for many years.  States her headache is "probably from all the stress I'm under." Denies headache was sudden or maximal in onset or at any time.  Denies visual changes, no focal motor weakness, no tingling/numbness in extremities, no fevers, no neck pain, no rash.     Past Medical History:  Diagnosis Date  . Asthma    triggered by weather changes; no inhaler  . Cancer ([place])   . Fibromyalgia   . Ganglion cyst of wrist 12/2012   left dorsal cyst  . GERD (gastroesophageal reflux disease)    TUMS as needed  . Headache(784.0)    tension/migraines  . PONV (postoperative nausea and vomiting)    also states stopped breathing after hernia surgery  . Rectal bleeding   . Rheumatoid arthritis ([place])   . Stenosing tenosynovitis of finger 12/2012   left middle finger  . 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 Stiffness of joint, not elsewhere classified, ankle and foot 09/07/2014  . Pain in joint, ankle and foot 09/07/2014  . Difficulty in walking(719.7) 09/07/2014  . 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 Hematochezia 08/11/2013  . Constipation 08/11/2013  . RHEUMATOID ARTHRITIS 02/20/2010  . KNEE PAIN 02/20/2010  . FIBROMYALGIA 02/20/2010    Past Surgical History:  Procedure Laterality Date  . ABDOMINAL HYSTERECTOMY  1994  . APPENDECTOMY  1996  . COLONOSCOPY N/A 05/08/2013   Procedure: COLONOSCOPY;  Surgeon: Rogene Houston, MD;   [facility];  Service: Endoscopy;  Laterality: N/A;  240  . COLONOSCOPY N/A 09/23/2015   Procedure: COLONOSCOPY;  Surgeon: Rogene Houston, MD;  [facility];  Service: Endoscopy;  Laterality: N/A;  825  . CYSTOSCOPY WITH HYDRODISTENSION AND BIOPSY  04/01/2007  . [place]   left tube and ovary removed  . GANGLION CYST EXCISION  12/17/2012   Procedure: REMOVAL GANGLION OF WRIST;  Surgeon: Wynonia Sours, MD;  Location: [place];  Service: Orthopedics;  Laterality: Left;  . HARDWARE REMOVAL Right 08/09/2014   Procedure: Placement Right Ankle Tight Rope, Removal Plate and Screws;  Surgeon: Marybelle Killings, MD;  Location: [place];  Service: Orthopedics;  Laterality: Right;  . PUBOVAGINAL SLING  2010  . rt ankle surgery    . SALPINGOOPHORECTOMY  1996   right  . TRIGGER FINGER RELEASE  12/17/2012   Procedure: RELEASE TRIGGER FINGER/A-1 PULLEY;  Surgeon: Wynonia Sours, MD;  Location: [place];  Service: Orthopedics;  Laterality: Left;  . TUBAL LIGATION  1992  . VAGINA RECONSTRUCTION SURGERY     x 5 since 2010  . VENTRAL HERNIA REPAIR  09/06/2006    OB History    Gravida Para Term Preterm AB Living   3 2   2 1      SAB TAB Ectopic Multiple Live Births  1           Home Medications    Prior to Admission medications   Medication Sig Start Date End Date Taking? Authorizing Provider  conjugated estrogens (PREMARIN) vaginal cream Place 1 Applicatorful vaginally daily. Patient not taking: Reported on 03/14/2017 01/17/17   Florian Buff, MD  estradiol (ESTRACE) 2 MG tablet Take 1 tablet (2 mg total) by mouth daily. 01/17/17   Florian Buff, MD  gabapentin (NEURONTIN) 400 MG capsule Take 1 capsule (400 mg total) by mouth at bedtime. 12/25/16   Setzer, Rona Ravens, NP  methocarbamol (ROBAXIN) 500 MG tablet Take 1 tablet (500 mg total) by mouth 3 (three) times daily. 03/14/17   Triplett, Tammy, PA-C  Multiple Vitamin (MULTIVITAMIN)  tablet Take 1 tablet by mouth daily.    [provider]  Multiple Vitamins-Minerals (HAIR SKIN AND NAILS FORMULA) TABS Take 1 tablet by mouth daily.    [provider]  naproxen (NAPROSYN) 500 MG tablet Take 1 tablet (500 mg total) by mouth 2 (two) times daily with a meal. 03/14/17   Triplett, Tammy, PA-C  pantoprazole (PROTONIX) 40 MG tablet Take 1 tablet (40 mg total) by mouth daily. 12/25/16   Setzer, Rona Ravens, NP  predniSONE (DELTASONE) 10 MG tablet Take 6 tablets day one, 5 tablets day two, 4 tablets day three, 3 tablets day four, 2 tablets day five, then 1 tablet day six 03/14/17   Triplett, Tammy, PA-C  Vitamin D, Ergocalciferol, (DRISDOL) 50000 units CAPS capsule Take 5,000 Units by mouth daily.     [provider]    Family History Family History  Problem Relation Age of Onset  . Lung cancer Mother   . Cervical cancer Mother   . Heart disease Father   . Leukemia Father   . Cancer Maternal Grandmother        bladder and colon cancer    Social History Social History  Substance Use Topics  . Smoking status: Never Smoker  . Smokeless tobacco: Never Used  . Alcohol use No     Allergies   Imitrex [sumatriptan]; Percocet [oxycodone-acetaminophen]; Cymbalta [duloxetine hcl]; Latex; Propoxyphene n-acetaminophen; and Tramadol   Review of Systems Review of Systems ROS: Statement: All systems negative except as marked or noted in the HPI; Constitutional: Negative for fever and chills. ; ; Eyes: Negative for eye pain, redness and discharge. ; ; ENMT: Negative for ear pain, hoarseness, nasal congestion, sinus pressure and sore throat. ; ; Cardiovascular: Negative for chest pain, palpitations, diaphoresis, dyspnea and peripheral edema. ; ; Respiratory: Negative for cough, wheezing and stridor. ; ; Gastrointestinal: +N/V. Negative for diarrhea, abdominal pain, blood in stool, hematemesis, jaundice and rectal bleeding. . ; ; Genitourinary: Negative for dysuria, flank  pain and hematuria. ; ; Musculoskeletal: Negative for back pain and neck pain. Negative for swelling and trauma.; ; Skin: Negative for pruritus, rash, abrasions, blisters, bruising and skin lesion.; ; Neuro: +headache. Negative for lightheadedness and neck stiffness. Negative for weakness, altered level of consciousness, altered mental status, extremity weakness, paresthesias, involuntary movement, seizure and syncope.       Physical Exam Updated Vital Signs BP 130/80 (BP Location: Left Arm)   Pulse 77   Temp 98 F (36.7 C) (Oral)   Resp 18   Ht 5\' 3"  (1.6 m)   Wt 81.6 kg (180 lb)   SpO2 97%   BMI 31.89 kg/m   Physical Exam 0825: Physical examination:  Nursing notes reviewed; Vital signs and O2 SAT  reviewed;  Constitutional: Well developed, Well nourished, Well hydrated, In no acute distress; Head:  Normocephalic, atraumatic; Eyes: EOMI, PERRL, No scleral icterus; ENMT: TM's clear bilat.  +edemetous nasal turbinates bilat with clear rhinorrhea.  Mouth and pharynx normal, Mucous membranes moist; Neck: Supple, Full range of motion, No lymphadenopathy; Cardiovascular: Regular rate and rhythm, No gallop; Respiratory: Breath sounds clear & equal bilaterally, No wheezes.  Speaking full sentences with ease, Normal respiratory effort/excursion; Chest: Nontender, Movement normal; Abdomen: Soft, Nontender, Nondistended, Normal bowel sounds; Genitourinary: No CVA tenderness; Extremities: Pulses normal, No tenderness, No edema, No calf edema or asymmetry.; Neuro: AA&Ox3, Major CN grossly intact.  Speech clear. No gross focal motor or sensory deficits in extremities.; Skin: Color normal, Warm, Dry.   ED Treatments / Results  Labs (all labs ordered are listed, but only abnormal results are displayed)   EKG  EKG Interpretation None       Radiology   Procedures Procedures (including critical care time)  Medications Ordered in ED Medications  metoCLOPramide (REGLAN) injection 10 mg (not  administered)  ketorolac (TORADOL) injection 60 mg (not administered)  diphenhydrAMINE (BENADRYL) injection 50 mg (not administered)  acetaminophen (TYLENOL) tablet 650 mg (not administered)     Initial Impression / Assessment and Plan / ED Course  I have reviewed the triage vital signs and the nursing notes.  Pertinent labs & imaging results that were available during my care of the patient were reviewed by me and considered in my medical decision making (see chart for details).  MDM Reviewed: previous chart, nursing note and vitals   0950:  Feels better after meds and is ready to go home now. Tx symptomatically at this time, f/u PMD. Dx d/w pt and family.  Questions answered.  Verb understanding, agreeable to d/c home with outpt f/u.   Final Clinical Impressions(s) / ED Diagnoses   Final diagnoses:  None    New Prescriptions New Prescriptions   No medications on file     Francine Graven, DO 06/16/17 1049

## 2017-06-08 NOTE — Discharge Instructions (Signed)
Take over the counter tylenol, ibuprofen and benadryl, as directed on packaging, with the prescription given to you today, as needed for headache.  Keep a headache diary, as discussed.  Call your regular medical doctor on Monday to schedule a follow up appointment within the next 3  days.  Return to the Emergency Department immediately sooner if worsening.

## 2017-06-08 NOTE — ED Triage Notes (Signed)
Pt reports headache x3 days. Pt reports emesis x1 this am. Pt reports history of same and light and sound sensitivity. nad noted.

## 2017-07-07 ENCOUNTER — Other Ambulatory Visit (INDEPENDENT_AMBULATORY_CARE_PROVIDER_SITE_OTHER): Payer: Self-pay | Admitting: Internal Medicine

## 2017-07-07 DIAGNOSIS — K219 Gastro-esophageal reflux disease without esophagitis: Secondary | ICD-10-CM

## 2017-07-08 LAB — VITAMIN E
Vitamin E (Alpha Tocopherol): 19.3 mg/L (ref 7.0–25.1)
Vitamin E(Gamma Tocopherol): 3 mg/L (ref 0.5–5.5)

## 2017-08-29 ENCOUNTER — Emergency Department (HOSPITAL_COMMUNITY)
Admission: EM | Admit: 2017-08-29 | Discharge: 2017-08-29 | Disposition: A | Discharge status: Home / Self Care | Payer: BLUE CROSS/BLUE SHIELD | Attending: Emergency Medicine | Admitting: Emergency Medicine

## 2017-08-29 ENCOUNTER — Emergency Department (HOSPITAL_COMMUNITY): Payer: BLUE CROSS/BLUE SHIELD

## 2017-08-29 ENCOUNTER — Encounter (HOSPITAL_COMMUNITY): Payer: Self-pay | Admitting: *Deleted

## 2017-08-29 DIAGNOSIS — J45909 Unspecified asthma, uncomplicated: Secondary | ICD-10-CM | POA: Insufficient documentation

## 2017-08-29 DIAGNOSIS — M25521 Pain in right elbow: Secondary | ICD-10-CM | POA: Diagnosis not present

## 2017-08-29 DIAGNOSIS — S59901A Unspecified injury of right elbow, initial encounter: Secondary | ICD-10-CM | POA: Diagnosis not present

## 2017-08-29 DIAGNOSIS — Z79899 Other long term (current) drug therapy: Secondary | ICD-10-CM | POA: Diagnosis not present

## 2017-08-29 DIAGNOSIS — Y929 Unspecified place or not applicable: Secondary | ICD-10-CM | POA: Diagnosis not present

## 2017-08-29 DIAGNOSIS — Y939 Activity, unspecified: Secondary | ICD-10-CM | POA: Insufficient documentation

## 2017-08-29 DIAGNOSIS — S46311A Strain of muscle, fascia and tendon of triceps, right arm, initial encounter: Secondary | ICD-10-CM | POA: Diagnosis not present

## 2017-08-29 DIAGNOSIS — X58XXXA Exposure to other specified factors, initial encounter: Secondary | ICD-10-CM | POA: Diagnosis not present

## 2017-08-29 DIAGNOSIS — Y999 Unspecified external cause status: Secondary | ICD-10-CM | POA: Diagnosis not present

## 2017-08-29 MED ORDER — NAPROXEN 500 MG PO TABS: 500.0000 mg | ORAL_TABLET | Freq: Two times a day (BID) | ORAL | 0 refills | Status: DC

## 2017-08-29 NOTE — ED Provider Notes (Signed)
[facility] DEPT Provider Note   CSN: [id number] Arrival date & time: 08/29/17  0917     History   Chief Complaint Chief Complaint  Patient presents with  . Arm Pain    HPI [name] is a 50 y.o. female  with a history of fibromyalgia and rheumatoid arthritis presenting with a several week history of worsening right elbow pain describing pain which is constant but worsened with movements and worse at the end of her workday.  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  This morning she woke with swelling at her elbow with tingling in her fingers which has resolved prior to arrival.  She has used ice, ibuprofen and Tylenol with no improvement in her symptoms.  She denies injury but endorses chronic overuse with her job.  The history is provided by the patient.    Past Medical History:  Diagnosis Date  . Asthma    triggered by weather changes; no inhaler  . Cancer ([place])   . Fibromyalgia   . Ganglion cyst of wrist 12/2012   left dorsal cyst  . GERD (gastroesophageal reflux disease)    TUMS as needed  . Headache(784.0)    tension/migraines  . PONV (postoperative nausea and vomiting)    also states stopped breathing after hernia surgery  . Rectal bleeding   . Rheumatoid arthritis ([place])   . Stenosing tenosynovitis of finger 12/2012   left middle finger  . 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 [facility])     Patient Active Problem List   Diagnosis Date Noted  . Stiffness of joint, not elsewhere classified, ankle and foot 09/07/2014  . Pain in joint, ankle and foot 09/07/2014  . Difficulty in walking(719.7) 09/07/2014  . 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 Hematochezia 08/11/2013  . Constipation 08/11/2013  . RHEUMATOID ARTHRITIS 02/20/2010  . KNEE PAIN 02/20/2010  . FIBROMYALGIA 02/20/2010    Past Surgical History:  Procedure Laterality Date  . ABDOMINAL  HYSTERECTOMY  1994  . APPENDECTOMY  1996  . COLONOSCOPY N/A 05/08/2013   Procedure: COLONOSCOPY;  Surgeon: Rogene Houston, MD;  [facility];  Service: Endoscopy;  Laterality: N/A;  240  . COLONOSCOPY N/A 09/23/2015   Procedure: COLONOSCOPY;  Surgeon: Rogene Houston, MD;  [facility];  Service: Endoscopy;  Laterality: N/A;  825  . CYSTOSCOPY WITH HYDRODISTENSION AND BIOPSY  04/01/2007  . [place]   left tube and ovary removed  . GANGLION CYST EXCISION  12/17/2012   Procedure: REMOVAL GANGLION OF WRIST;  Surgeon: Wynonia Sours, MD;  Location: [facility];  Service: Orthopedics;  Laterality: Left;  . HARDWARE REMOVAL Right 08/09/2014   Procedure: Placement Right Ankle Tight Rope, Removal Plate and Screws;  Surgeon: Marybelle Killings, MD;  Location: [facility];  Service: Orthopedics;  Laterality: Right;  . PUBOVAGINAL SLING  2010  . rt ankle surgery    . SALPINGOOPHORECTOMY  1996   right  . TRIGGER FINGER RELEASE  12/17/2012   Procedure: RELEASE TRIGGER FINGER/A-1 PULLEY;  Surgeon: Wynonia Sours, MD;  Location: [place] [facility];  Service: Orthopedics;  Laterality: Left;  . TUBAL LIGATION  1992  . VAGINA RECONSTRUCTION SURGERY     x 5 since 2010  . VENTRAL HERNIA REPAIR  09/06/2006    OB History    Gravida  Para Term Preterm AB Living   3 2   2 1      SAB TAB Ectopic Multiple Live Births       1           Home Medications    Prior to Admission medications   Medication Sig Start Date End Date Taking? Authorizing Provider  conjugated estrogens (PREMARIN) vaginal cream Place 1 Applicatorful vaginally daily. Patient not taking: Reported on 03/14/2017 01/17/17   Florian Buff, MD  estradiol (ESTRACE) 2 MG tablet Take 1 tablet (2 mg total) by mouth daily. 01/17/17   Florian Buff, MD  gabapentin (NEURONTIN) 400 MG capsule Take 1 capsule (400 mg total) by mouth at bedtime. 12/25/16   Setzer, Rona Ravens, NP  methocarbamol (ROBAXIN) 500 MG tablet  Take 1 tablet (500 mg total) by mouth 3 (three) times daily. 03/14/17   Triplett, Tammy, PA-C  metoCLOPramide (REGLAN) 10 MG tablet Take 1 tablet (10 mg total) by mouth every 6 (six) hours as needed for nausea (or headache). 06/08/17   Francine Graven, DO  Multiple Vitamin (MULTIVITAMIN) tablet Take 1 tablet by mouth daily.    [provider]  Multiple Vitamins-Minerals (HAIR SKIN AND NAILS FORMULA) TABS Take 1 tablet by mouth daily.    [provider]  naproxen (NAPROSYN) 500 MG tablet Take 1 tablet (500 mg total) by mouth 2 (two) times daily. 08/29/17   Evalee Jefferson, PA-C  pantoprazole (PROTONIX) 40 MG tablet TAKE 1 TABLET (40 MG TOTAL) BY MOUTH DAILY. 07/08/17   Setzer, Rona Ravens, NP  predniSONE (DELTASONE) 10 MG tablet Take 6 tablets day one, 5 tablets day two, 4 tablets day three, 3 tablets day four, 2 tablets day five, then 1 tablet day six 03/14/17   Triplett, Tammy, PA-C  Vitamin D, Ergocalciferol, (DRISDOL) 50000 units CAPS capsule Take 5,000 Units by mouth daily.     [provider]    Family History Family History  Problem Relation Age of Onset  . Lung cancer Mother   . Cervical cancer Mother   . Heart disease Father   . Leukemia Father   . Cancer Maternal Grandmother        bladder and colon cancer    Social History Social History  Substance Use Topics  . Smoking status: Never Smoker  . Smokeless tobacco: Never Used  . Alcohol use No     Allergies   Imitrex [sumatriptan]; Percocet [oxycodone-acetaminophen]; Cymbalta [duloxetine hcl]; Latex; Propoxyphene n-acetaminophen; and Tramadol   Review of Systems Review of Systems  Constitutional: Negative for fever.  Musculoskeletal: Positive for arthralgias and joint swelling. Negative for myalgias.  Neurological: Negative for weakness and numbness.     Physical Exam Updated Vital Signs Ht 5\' 3"  (1.6 m)   Wt 79.4 kg (175 lb)   BMI 31.00 kg/m   Physical Exam  Constitutional: She appears  well-developed and well-nourished.  HENT:  Head: Atraumatic.  Neck: Normal range of motion.  Cardiovascular:  Pulses equal bilaterally  Musculoskeletal: She exhibits tenderness. She exhibits no edema or deformity.       Right elbow: She exhibits no swelling, no effusion and no deformity. Tenderness found. Olecranon process tenderness noted. No radial head, no medial epicondyle and no lateral epicondyle tenderness noted.  Most tender to palpation at the right olecranon process and extending into the tricep tendon.  Pain is worsened with resisted right forearm extension at this site.  She has no edema in the extremities.  Radial pulses are equal  and intact bilaterally.  Distal sensation is normal.    Neurological: She is alert. She has normal strength. She displays normal reflexes. No sensory deficit.  Skin: Skin is warm and dry.  Psychiatric: She has a normal mood and affect.     ED Treatments / Results  Labs (all labs ordered are listed, but only abnormal results are displayed) Labs Reviewed - No data to display  EKG  EKG Interpretation None       Radiology Dg Elbow Complete Right  Result Date: 08/29/2017 CLINICAL DATA:  Right elbow pain for 3 weeks.  No known injury. EXAM: RIGHT ELBOW - COMPLETE 3+ VIEW COMPARISON:  None. FINDINGS: There is no evidence of fracture, dislocation, or joint effusion. There is no evidence of arthropathy or other focal bone abnormality. Soft tissues are unremarkable. IMPRESSION: Negative. Electronically Signed   By: Titus Dubin M.D.   On: 08/29/2017 11:45    Procedures Procedures (including critical care time)  Medications Ordered in ED Medications - No data to display   Initial Impression / Assessment and Plan / ED Course  I have reviewed the triage vital signs and the nursing notes.  Pertinent labs & imaging results that were available during my care of the patient were reviewed by me and considered in my medical decision making (see chart  for details).     Imaging reviewed and negative.  Patient was given a sling for comfort.  Advised ice alternating with heat therapy.  Anti-inflammatories.  Referral given for orthopedic follow-up. Final Clinical Impressions(s) / ED Diagnoses   Final diagnoses:  Triceps strain, right, initial encounter    New Prescriptions New Prescriptions   NAPROXEN (NAPROSYN) 500 MG TABLET    Take 1 tablet (500 mg total) by mouth 2 (two) times daily.     Evalee Jefferson, PA-C 08/29/17 1211    Noemi Chapel, MD 09/06/17 [phone]

## 2017-08-29 NOTE — ED Notes (Signed)
Called from Waiting room, no answer

## 2017-08-29 NOTE — ED Triage Notes (Signed)
Pt comes in with right arm pain. Pain is noted from her right elbow down and worsens with movement. Denies any jury but works at [employer] which requires her to have repetitive movements of her arms. States she woke up with swelling to this arm, none noted in triage.

## 2017-08-29 NOTE — Discharge Instructions (Signed)
You may wear the sling if you feel this provides comfort and support, this will help to rest your tricep muscle and tendon.  I recommend alternating ice and heat as discussed.  Anti-inflammatories as prescribed.  Call Dr. Aline Brochure for further evaluation and management of this strain.

## 2017-08-30 ENCOUNTER — Encounter: Payer: Self-pay | Admitting: Orthopedic Surgery

## 2017-08-30 ENCOUNTER — Ambulatory Visit (INDEPENDENT_AMBULATORY_CARE_PROVIDER_SITE_OTHER): Payer: BLUE CROSS/BLUE SHIELD | Admitting: Orthopedic Surgery

## 2017-08-30 VITALS — BP 116/74 | HR 66 | Ht 63.0 in | Wt 181.0 lb

## 2017-08-30 DIAGNOSIS — S46311A Strain of muscle, fascia and tendon of triceps, right arm, initial encounter: Secondary | ICD-10-CM

## 2017-08-30 NOTE — Patient Instructions (Signed)
Out of work one week. 

## 2017-08-30 NOTE — Progress Notes (Signed)
Patient ID: [name], female   DOB: 1966-07-29, 51 y.o.   MRN: [id number]  Chief Complaint  Patient presents with  . New Patient (Initial Visit)    Right elbow  pain several weeks. No known injury.    HPI [name] is a 51 y.o. female.  This is a 51 year old right-hand-dominant female presents for evaluation of her right elbow and right ankle. She fell on August 30 she grabbed a railing and since that time she's had right elbow pain and right ankle pain. She did have right ankle surgery plate and screws put in and that was functioning well until the fall  Right elbow pain over the triceps pain with extension pain with loaded extension. Dull aching pain unrelieved by ice and heat. She went to the emergency room yesterday they put her on Naprosyn  As far as ankle goes she has swelling along the right ankle with pain over the anterior talofibular ligament no loss of motion pain is mild.     Review of Systems Review of Systems  Musculoskeletal: Positive for joint swelling. Negative for gait problem.  Neurological: Positive for weakness. Negative for numbness.   (2 MINIMUM)  Past Medical History:  Diagnosis Date  . Asthma    triggered by weather changes; no inhaler  . Cancer ([place])   . Fibromyalgia   . Ganglion cyst of wrist 12/2012   left dorsal cyst  . GERD (gastroesophageal reflux disease)    TUMS as needed  . Headache(784.0)    tension/migraines  . PONV (postoperative nausea and vomiting)    also states stopped breathing after hernia surgery  . Rectal bleeding   . Rheumatoid arthritis ([place])   . Stenosing tenosynovitis of finger 12/2012   left middle finger  . 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 [facility])     Past Surgical History:  Procedure Laterality Date  . ABDOMINAL HYSTERECTOMY  1994  . APPENDECTOMY  1996  . COLONOSCOPY N/A 05/08/2013   Procedure: COLONOSCOPY;  Surgeon: Rogene Houston, MD;  [facility];  Service: Endoscopy;  Laterality: N/A;  240   . COLONOSCOPY N/A 09/23/2015   Procedure: COLONOSCOPY;  Surgeon: Rogene Houston, MD;  [facility];  Service: Endoscopy;  Laterality: N/A;  825  . CYSTOSCOPY WITH HYDRODISTENSION AND BIOPSY  04/01/2007  . [place]   left tube and ovary removed  . GANGLION CYST EXCISION  12/17/2012   Procedure: REMOVAL GANGLION OF WRIST;  Surgeon: Wynonia Sours, MD;  Location: [facility];  Service: Orthopedics;  Laterality: Left;  . HARDWARE REMOVAL Right 08/09/2014   Procedure: Placement Right Ankle Tight Rope, Removal Plate and Screws;  Surgeon: Marybelle Killings, MD;  Location: [facility];  Service: Orthopedics;  Laterality: Right;  . PUBOVAGINAL SLING  2010  . rt ankle surgery    . SALPINGOOPHORECTOMY  1996   right  . TRIGGER FINGER RELEASE  12/17/2012   Procedure: RELEASE TRIGGER FINGER/A-1 PULLEY;  Surgeon: Wynonia Sours, MD;  Location: [employer];  Service: Orthopedics;  Laterality: Left;  . TUBAL LIGATION  1992  . VAGINA RECONSTRUCTION SURGERY     x 5 since 2010  . VENTRAL HERNIA REPAIR  09/06/2006    Social History Social History  Substance Use Topics  . Smoking status: Never Smoker  . Smokeless tobacco: Never Used  . Alcohol use No    Allergies  Allergen Reactions  . Imitrex [Sumatriptan]  Causes a migraine  . Percocet [Oxycodone-Acetaminophen] Other (See Comments)    Eyes dilate  . Cymbalta [Duloxetine Hcl] Rash  . Latex Rash  . Propoxyphene N-Acetaminophen Nausea And Vomiting and Rash  . Tramadol Rash    Current Meds  Medication Sig  . estradiol (ESTRACE) 2 MG tablet Take 1 tablet (2 mg total) by mouth daily.  Marland Kitchen gabapentin (NEURONTIN) 400 MG capsule Take 1 capsule (400 mg total) by mouth at bedtime.  . Multiple Vitamins-Minerals (HAIR SKIN AND NAILS FORMULA) TABS Take 1 tablet by mouth daily.  . naproxen (NAPROSYN) 500 MG tablet Take 1 tablet (500 mg total) by mouth 2 (two) times daily.  . pantoprazole (PROTONIX) 40 MG tablet  TAKE 1 TABLET (40 MG TOTAL) BY MOUTH DAILY.  Marland Kitchen Vitamin D, Ergocalciferol, (DRISDOL) 50000 units CAPS capsule Take 5,000 Units by mouth daily.       Physical Exam Physical Exam 1.BP 116/74   Pulse 66   Ht 5\' 3"  (1.6 m)   Wt 181 lb (82.1 kg)   BMI 32.06 kg/m   2. Gen. appearance. The patient is well-developed and well-nourished, grooming and hygiene are normal. There are no gross congenital abnormalities  3. The patient is alert and oriented to person place and time  4. Mood and affect are normal  5. Ambulation Normal no limping  Examination reveals the following: 6. On inspection we find right ankle lateral incision healed no erythema. Tenderness over the anterior talofibular ligament  7. With the range of motion of  normal  8. Stability tests were normal  drawer test  9. Strength tests revealed grade 5 motor strength  10. Skin we find no rash ulceration or erythema  11. Sensation remains intact  12 Vascular system shows no peripheral edema  On the right elbow we find tenderness over the triceps no tenderness over the lateral epicondyle or medial epicondyles she has pain with resisted triceps extension mild triceps weakness elbow is stable skin is normal pulses are good and sensation is  MEDICAL DECISION MAKING:    Data Reviewed I interpreted the x-rays as X-ray right elbow no fracture dislocation bone normal  X-ray report says EXAM: RIGHT ELBOW - COMPLETE 3+ VIEW   COMPARISON:  None.   FINDINGS: There is no evidence of fracture, dislocation, or joint effusion. There is no evidence of arthropathy or other focal bone abnormality. Soft tissues are unremarkable.   IMPRESSION: Negative.     Electronically Signed   By: Titus Dubin M.D.   On: 08/29/2017 11:45     Assessment Encounter Diagnosis  Name Primary?  . Strain of right triceps, initial encounter Yes    Plan Recommend one week out of work  Naprosyn twice a day  Heat  Probable 8 weeks  total to heal  Arther Abbott 08/30/2017, 10:53 AM

## 2017-12-09 ENCOUNTER — Encounter (INDEPENDENT_AMBULATORY_CARE_PROVIDER_SITE_OTHER): Payer: Self-pay | Admitting: Internal Medicine

## 2017-12-12 ENCOUNTER — Other Ambulatory Visit: Payer: Self-pay | Admitting: Obstetrics & Gynecology

## 2017-12-12 DIAGNOSIS — Z1231 Encounter for screening mammogram for malignant neoplasm of breast: Secondary | ICD-10-CM

## 2017-12-18 ENCOUNTER — Other Ambulatory Visit (INDEPENDENT_AMBULATORY_CARE_PROVIDER_SITE_OTHER): Payer: Self-pay | Admitting: Internal Medicine

## 2017-12-25 ENCOUNTER — Ambulatory Visit (INDEPENDENT_AMBULATORY_CARE_PROVIDER_SITE_OTHER): Payer: BLUE CROSS/BLUE SHIELD | Admitting: Internal Medicine

## 2017-12-25 ENCOUNTER — Encounter (INDEPENDENT_AMBULATORY_CARE_PROVIDER_SITE_OTHER): Payer: Self-pay | Admitting: Internal Medicine

## 2017-12-25 VITALS — BP 142/86 | HR 72 | Temp 98.2°F | Ht 63.0 in | Wt 181.2 lb

## 2017-12-25 DIAGNOSIS — K219 Gastro-esophageal reflux disease without esophagitis: Secondary | ICD-10-CM

## 2017-12-25 NOTE — Patient Instructions (Signed)
Continue the Protonix. OV in 1 year.  

## 2017-12-25 NOTE — Progress Notes (Signed)
   Subjective:    Patient ID: [name], female    DOB: 08-06-1966, 53 y.o.   MRN: [id number] 64 wt 177 12/2016 HPI Here today for f/u. Last seen in January of 2018. Hx of GERD and constipation.  She tells me she is doing good. She works part-time. Acid reflux controlled with Protonix. If she skips more than a day, she will have acid reflux. Her appetite is good. She is having a BM daily now. She has been putting more green in her diet. Family hx of colon cancer in grandmother at age 64.    09/23/2015 Colonoscopy  Indications:Patient is 52 year old Caucasian female who underwent colonoscopy in May 2014 with removal of 4 small polyps and these are tubular adenomas. Patient now presents with frequent hematochezia. She passes fresh blood and clots. She says this occurs even when she is not constipated. Her bowels are irregular and at times she may go weeks without a bowel movement. Family history significant for CRC in maternal grandmother who died at 35 of postop complications.  Findings:  Prep excellent. Normal mucosa of terminal ileum. Single small diverticulum noted at proximal transverse colon otherwise mucosa of cecum ascending colon hepatic flexure, transverse colon and splenic flexure as well as descending and sigmoid colon was normal. Normal rectal mucosa. Small hemorrhoids below the dentate line along with focal erythema.   05/08/2013 Colonoscopy: Dr. Laural Golden: rectal bleeding: Impression:  Examination performed to cecum. Noncompliant sigmoid colon resulting in difficult exam. Four small polyps ablated via cold biopsy and submitted together(two at cecum, one at hepatic flexure and fourth one at sigmoid colon). External hemorrhoids. All 4 polyps or tubular adenomas. Results have been reviewed with patient. Next colonoscopy in 5 years under fluoroscopy.  Review of Systems     Objective:   Physical Exam Blood pressure (!) 142/86, pulse 72, temperature 98.2 F  (36.8 C), height 5\' 3"  (1.6 m), weight 181 lb 3.2 oz (82.2 kg). Alert and oriented. Skin warm and dry. Oral mucosa is moist.   . Sclera anicteric, conjunctivae is pink. Thyroid not enlarged. No cervical lymphadenopathy. Lungs clear. Heart regular rate and rhythm.  Abdomen is soft. Bowel sounds are positive. No hepatomegaly. No abdominal masses felt. No tenderness.  No edema to lower extremities.           Assessment & Plan:  GERD. Continue the Protonix.  Family hx of colon cancer. Next colonoscopy in 2021.

## 2018-01-15 ENCOUNTER — Ambulatory Visit (HOSPITAL_COMMUNITY): Payer: BLUE CROSS/BLUE SHIELD

## 2018-01-21 ENCOUNTER — Encounter: Payer: Self-pay | Admitting: Obstetrics & Gynecology

## 2018-01-21 ENCOUNTER — Other Ambulatory Visit (HOSPITAL_COMMUNITY)
Admission: RE | Admit: 2018-01-21 | Discharge: 2018-01-21 | Disposition: A | Discharge status: Home / Self Care | Payer: BLUE CROSS/BLUE SHIELD | Source: Ambulatory Visit | Attending: Obstetrics & Gynecology | Admitting: Obstetrics & Gynecology

## 2018-01-21 ENCOUNTER — Ambulatory Visit (INDEPENDENT_AMBULATORY_CARE_PROVIDER_SITE_OTHER): Payer: BLUE CROSS/BLUE SHIELD | Admitting: Obstetrics & Gynecology

## 2018-01-21 VITALS — BP 130/80 | HR 93 | Ht 63.75 in | Wt 179.2 lb

## 2018-01-21 DIAGNOSIS — Z01419 Encounter for gynecological examination (general) (routine) without abnormal findings: Secondary | ICD-10-CM | POA: Diagnosis not present

## 2018-01-21 DIAGNOSIS — Z1211 Encounter for screening for malignant neoplasm of colon: Secondary | ICD-10-CM

## 2018-01-21 DIAGNOSIS — Z1212 Encounter for screening for malignant neoplasm of rectum: Secondary | ICD-10-CM

## 2018-01-21 LAB — HEMOCCULT GUIAC POC 1CARD (OFFICE): Fecal Occult Blood, POC: NEGATIVE

## 2018-01-21 MED ORDER — ESTRADIOL 2 MG PO TABS: 2.0000 mg | ORAL_TABLET | Freq: Every day | ORAL | 11 refills | Status: DC

## 2018-01-21 NOTE — Progress Notes (Signed)
Subjective:     [name] is a 52 y.o. female here for a routine exam.  No LMP recorded. Patient has had a hysterectomy. [id number] Birth Control Method:  hysterectomy Menstrual Calendar(currently): amenorrheic  Current complaints: missing estrogen causes hot flashes.   Current acute medical issues:  none   Recent Gynecologic History No LMP recorded. Patient has had a hysterectomy. Last Pap: 2017,  normal Last mammogram: 2018,  normal  Past Medical History:  Diagnosis Date  . Asthma    triggered by weather changes; no inhaler  . Cancer ([place])   . Fibromyalgia   . Ganglion cyst of wrist 12/2012   left dorsal cyst  . GERD (gastroesophageal reflux disease)    TUMS as needed  . Headache(784.0)    tension/migraines  . PONV (postoperative nausea and vomiting)    also states stopped breathing after hernia surgery  . Rectal bleeding   . Rheumatoid arthritis ([place])   . Stenosing tenosynovitis of finger 12/2012   left middle finger  . 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 ABDOMINAL HYSTERECTOMY  1994  . APPENDECTOMY  1996  . COLONOSCOPY N/A 05/08/2013   Procedure: COLONOSCOPY;  Surgeon: Rogene Houston, MD;  [facility];  Service: Endoscopy;  Laterality: N/A;  240  . COLONOSCOPY N/A 09/23/2015   Procedure: COLONOSCOPY;  Surgeon: Rogene Houston, MD;  [facility];  Service: Endoscopy;  Laterality: N/A;  825  . CYSTOSCOPY WITH HYDRODISTENSION AND BIOPSY  04/01/2007  . [facility]   left tube and ovary removed  . GANGLION CYST EXCISION  12/17/2012   Procedure: REMOVAL GANGLION OF WRIST;  Surgeon: Wynonia Sours, MD;  Location: [place];  Service: Orthopedics;  Laterality: Left;  . HARDWARE REMOVAL Right 08/09/2014   Procedure: Placement Right Ankle Tight Rope, Removal Plate and Screws;  Surgeon: Marybelle Killings, MD;  Location: [place];  Service: Orthopedics;  Laterality:  Right;  . PUBOVAGINAL SLING  2010  . rt ankle surgery    . SALPINGOOPHORECTOMY  1996   right  . TRIGGER FINGER RELEASE  12/17/2012   Procedure: RELEASE TRIGGER FINGER/A-1 PULLEY;  Surgeon: Wynonia Sours, MD;  Location: [place];  Service: Orthopedics;  Laterality: Left;  . TUBAL LIGATION  1992  . VAGINA RECONSTRUCTION SURGERY     x 5 since 2010  . VENTRAL HERNIA REPAIR  09/06/2006    OB History    Gravida Para Term Preterm AB Living   3 2 2  0 1 2   SAB TAB Ectopic Multiple Live Births       1   2      Social History   Socioeconomic History  . Marital status: Married    Spouse name: None  . Number of children: None  . Years of education: None  . Highest education level: None  Social Needs  . Financial resource strain: None  . Food insecurity - worry: None  . Food insecurity - inability: None  . Transportation needs - medical: None  . Transportation needs - non-medical: None  Occupational History  . None  Tobacco Use  . Smoking status: Never Smoker  . Smokeless tobacco: Never Used  Substance and Sexual Activity  . Alcohol use: No  . Drug use: No  . Sexual activity: Yes    Birth control/protection: Surgical    Comment: hyst  Other Topics Concern  . None  Social History Narrative  . None    Family History  Problem Relation Age of Onset  . Lung cancer Mother   . Cervical cancer Mother   . Heart disease Father   . Leukemia Father   . Cancer Maternal Grandmother        bladder and colon cancer     Current Outpatient Medications:  .  estradiol (ESTRACE) 2 MG tablet, Take 1 tablet (2 mg total) by mouth daily., Disp: 30 tablet, Rfl: 11 .  gabapentin (NEURONTIN) 400 MG capsule, Take 1 capsule (400 mg total) by mouth at bedtime., Disp: 90 capsule, Rfl: 0 .  Multiple Vitamins-Minerals (HAIR SKIN AND NAILS FORMULA) TABS, Take 1 tablet by mouth daily., Disp: , Rfl:  .  pantoprazole (PROTONIX) 40 MG tablet, TAKE 1 TABLET (40 MG TOTAL) BY MOUTH DAILY.,  Disp: 30 tablet, Rfl: 5 .  Vitamin D, Ergocalciferol, (DRISDOL) 50000 units CAPS capsule, Take 5,000 Units by mouth daily. , Disp: , Rfl:   Review of Systems  Review of Systems  Constitutional: Negative for fever, chills, weight loss, malaise/fatigue and diaphoresis.  HENT: Negative for hearing loss, ear pain, nosebleeds, congestion, sore throat, neck pain, tinnitus and ear discharge.   Eyes: Negative for blurred vision, double vision, photophobia, pain, discharge and redness.  Respiratory: Negative for cough, hemoptysis, sputum production, shortness of breath, wheezing and stridor.   Cardiovascular: Negative for chest pain, palpitations, orthopnea, claudication, leg swelling and PND.  Gastrointestinal: negative for abdominal pain. Negative for heartburn, nausea, vomiting, diarrhea, constipation, blood in stool and melena.  Genitourinary: Negative for dysuria, urgency, frequency, hematuria and flank pain.  Musculoskeletal: Negative for myalgias, back pain, joint pain and falls.  Skin: Negative for itching and rash.  Neurological: Negative for dizziness, tingling, tremors, sensory change, speech change, focal weakness, seizures, loss of consciousness, weakness and headaches.  Endo/Heme/Allergies: Negative for environmental allergies and polydipsia. Does not bruise/bleed easily.  Psychiatric/Behavioral: Negative for depression, suicidal ideas, hallucinations, memory loss and substance abuse. The patient is not nervous/anxious and does not have insomnia.        Objective:  Blood pressure 130/80, pulse 93, height 5' 3.75" (1.619 m), weight 179 lb 3.2 oz (81.3 kg).   Physical Exam  Vitals reviewed. Constitutional: She is oriented to person, place, and time. She appears well-developed and well-nourished.  HENT:  Head: Normocephalic and atraumatic.        Right Ear: External ear normal.  Left Ear: External ear normal.  Nose: Nose normal.  Mouth/Throat: Oropharynx is clear and moist.  Eyes:  Conjunctivae and EOM are normal. Pupils are equal, round, and reactive to light. Right eye exhibits no discharge. Left eye exhibits no discharge. No scleral icterus.  Neck: Normal range of motion. Neck supple. No tracheal deviation present. No thyromegaly present.  Cardiovascular: Normal rate, regular rhythm, normal heart sounds and intact distal pulses.  Exam reveals no gallop and no friction rub.   No murmur heard. Respiratory: Effort normal and breath sounds normal. No respiratory distress. She has no wheezes. She has no rales. She exhibits no tenderness.  GI: Soft. Bowel sounds are normal. She exhibits no distension and no mass. There is no tenderness. There is no rebound and no guarding.  Genitourinary:  Breasts no masses skin changes or nipple changes bilaterally      Vulva is normal without lesions Vagina is pink moist without discharge Cervix normal in appearance and pap is done Uterus is normal Adnexa is  negative with normal sized ovaries  {Rectal    hemoccult negative, normal tone, no masses  Musculoskeletal: Normal range of motion. She exhibits no edema and no tenderness.  Neurological: She is alert and oriented to person, place, and time. She has normal reflexes. She displays normal reflexes. No cranial nerve deficit. She exhibits normal muscle tone. Coordination normal.  Skin: Skin is warm and dry. No rash noted. No erythema. No pallor.  Psychiatric: She has a normal mood and affect. Her behavior is normal. Judgment and thought content normal.       Medications Ordered at today's visit: Meds ordered this encounter  Medications  . estradiol (ESTRACE) 2 MG tablet    Sig: Take 1 tablet (2 mg total) by mouth daily.    Dispense:  30 tablet    Refill:  11    Other orders placed at today's visit: No orders of the defined types were placed in this encounter.     Assessment:    Healthy female exam.    Plan:    Hormone replacement therapy: hormone replacement therapy:  estrace 2 mg. Mammogram ordered.     Return in about 2 years (around 01/22/2020) for yearly, with Dr Elonda Husky.

## 2018-01-22 ENCOUNTER — Ambulatory Visit (HOSPITAL_COMMUNITY)
Admission: RE | Admit: 2018-01-22 | Discharge: 2018-01-22 | Disposition: A | Discharge status: Home / Self Care | Payer: BLUE CROSS/BLUE SHIELD | Source: Ambulatory Visit | Attending: Obstetrics & Gynecology | Admitting: Obstetrics & Gynecology

## 2018-01-22 DIAGNOSIS — Z1231 Encounter for screening mammogram for malignant neoplasm of breast: Secondary | ICD-10-CM | POA: Diagnosis not present

## 2018-01-23 LAB — CYTOLOGY - PAP
Adequacy: ABSENT
Diagnosis: NEGATIVE
HPV: NOT DETECTED

## 2018-01-25 ENCOUNTER — Encounter: Payer: Self-pay | Admitting: Orthopedic Surgery

## 2018-02-04 ENCOUNTER — Other Ambulatory Visit: Payer: Self-pay | Admitting: Obstetrics & Gynecology

## 2018-03-02 DIAGNOSIS — R112 Nausea with vomiting, unspecified: Secondary | ICD-10-CM | POA: Diagnosis not present

## 2018-03-02 DIAGNOSIS — J09X3 Influenza due to identified novel influenza A virus with gastrointestinal manifestations: Secondary | ICD-10-CM | POA: Diagnosis not present

## 2018-03-03 ENCOUNTER — Other Ambulatory Visit: Payer: Self-pay | Admitting: *Deleted

## 2018-03-03 MED ORDER — ESTRADIOL 2 MG PO TABS: 2.0000 mg | ORAL_TABLET | Freq: Every day | ORAL | 4 refills | Status: DC

## 2018-03-13 DIAGNOSIS — L720 Epidermal cyst: Secondary | ICD-10-CM | POA: Diagnosis not present

## 2018-03-13 DIAGNOSIS — D225 Melanocytic nevi of trunk: Secondary | ICD-10-CM | POA: Diagnosis not present

## 2018-03-13 DIAGNOSIS — B351 Tinea unguium: Secondary | ICD-10-CM | POA: Diagnosis not present

## 2018-03-13 DIAGNOSIS — D485 Neoplasm of uncertain behavior of skin: Secondary | ICD-10-CM | POA: Diagnosis not present

## 2018-03-24 DIAGNOSIS — L988 Other specified disorders of the skin and subcutaneous tissue: Secondary | ICD-10-CM | POA: Diagnosis not present

## 2018-03-24 DIAGNOSIS — D485 Neoplasm of uncertain behavior of skin: Secondary | ICD-10-CM | POA: Diagnosis not present

## 2018-03-24 DIAGNOSIS — D2271 Melanocytic nevi of right lower limb, including hip: Secondary | ICD-10-CM | POA: Diagnosis not present

## 2018-05-09 DIAGNOSIS — Z6831 Body mass index (BMI) 31.0-31.9, adult: Secondary | ICD-10-CM | POA: Diagnosis not present

## 2018-05-09 DIAGNOSIS — R202 Paresthesia of skin: Secondary | ICD-10-CM | POA: Diagnosis not present

## 2018-05-13 ENCOUNTER — Other Ambulatory Visit (HOSPITAL_COMMUNITY): Payer: Self-pay | Admitting: Internal Medicine

## 2018-05-13 DIAGNOSIS — R200 Anesthesia of skin: Secondary | ICD-10-CM

## 2018-05-15 ENCOUNTER — Ambulatory Visit (HOSPITAL_COMMUNITY)
Admission: RE | Admit: 2018-05-15 | Discharge: 2018-05-15 | Disposition: A | Discharge status: Home / Self Care | Payer: BLUE CROSS/BLUE SHIELD | Source: Ambulatory Visit | Attending: Internal Medicine | Admitting: Internal Medicine

## 2018-05-15 DIAGNOSIS — R200 Anesthesia of skin: Secondary | ICD-10-CM | POA: Diagnosis not present

## 2018-05-15 MED ORDER — GADOBENATE DIMEGLUMINE 529 MG/ML IV SOLN
15.0000 mL | Freq: Once | INTRAVENOUS | Status: AC | PRN
  Administered 2018-05-15: 15 mL via INTRAVENOUS

## 2018-07-23 ENCOUNTER — Ambulatory Visit: Payer: BLUE CROSS/BLUE SHIELD | Admitting: Diagnostic Neuroimaging

## 2018-09-22 DIAGNOSIS — L905 Scar conditions and fibrosis of skin: Secondary | ICD-10-CM | POA: Diagnosis not present

## 2018-09-22 DIAGNOSIS — D225 Melanocytic nevi of trunk: Secondary | ICD-10-CM | POA: Diagnosis not present

## 2018-10-03 ENCOUNTER — Other Ambulatory Visit (INDEPENDENT_AMBULATORY_CARE_PROVIDER_SITE_OTHER): Payer: Self-pay | Admitting: Internal Medicine

## 2018-10-03 DIAGNOSIS — K219 Gastro-esophageal reflux disease without esophagitis: Secondary | ICD-10-CM

## 2018-10-16 ENCOUNTER — Emergency Department (HOSPITAL_COMMUNITY): Payer: BLUE CROSS/BLUE SHIELD

## 2018-10-16 ENCOUNTER — Other Ambulatory Visit: Payer: Self-pay

## 2018-10-16 ENCOUNTER — Observation Stay (HOSPITAL_COMMUNITY)
Admission: EM | Admit: 2018-10-16 | Discharge: 2018-10-17 | Disposition: A | Discharge status: Home / Self Care | Payer: BLUE CROSS/BLUE SHIELD | Attending: Family Medicine | Admitting: Family Medicine

## 2018-10-16 ENCOUNTER — Encounter (HOSPITAL_COMMUNITY): Payer: Self-pay | Admitting: Emergency Medicine

## 2018-10-16 DIAGNOSIS — R0789 Other chest pain: Principal | ICD-10-CM | POA: Insufficient documentation

## 2018-10-16 DIAGNOSIS — Z79899 Other long term (current) drug therapy: Secondary | ICD-10-CM | POA: Insufficient documentation

## 2018-10-16 DIAGNOSIS — J45909 Unspecified asthma, uncomplicated: Secondary | ICD-10-CM | POA: Insufficient documentation

## 2018-10-16 DIAGNOSIS — K219 Gastro-esophageal reflux disease without esophagitis: Secondary | ICD-10-CM

## 2018-10-16 DIAGNOSIS — R079 Chest pain, unspecified: Secondary | ICD-10-CM | POA: Diagnosis not present

## 2018-10-16 DIAGNOSIS — R072 Precordial pain: Secondary | ICD-10-CM

## 2018-10-16 DIAGNOSIS — Z91040 Latex allergy status: Secondary | ICD-10-CM | POA: Insufficient documentation

## 2018-10-16 LAB — COMPREHENSIVE METABOLIC PANEL
ALT: 13 U/L (ref 0–44)
AST: 20 U/L (ref 15–41)
Albumin: 3.6 g/dL (ref 3.5–5.0)
Alkaline Phosphatase: 57 U/L (ref 38–126)
Anion gap: 8 (ref 5–15)
BUN: 11 mg/dL (ref 6–20)
CO2: 24 mmol/L (ref 22–32)
Calcium: 8.7 mg/dL — ABNORMAL LOW (ref 8.9–10.3)
Chloride: 104 mmol/L (ref 98–111)
Creatinine, Ser: 0.72 mg/dL (ref 0.44–1.00)
GFR calc Af Amer: >60 mL/min (ref >60)
GFR calc non Af Amer: >60 mL/min (ref >60)
Glucose, Bld: 99 mg/dL (ref 70–99)
Potassium: 3.8 mmol/L (ref 3.5–5.1)
Sodium: 136 mmol/L (ref 135–145)
Total Bilirubin: 0.4 mg/dL (ref 0.3–1.2)
Total Protein: 6.9 g/dL (ref 6.5–8.1)

## 2018-10-16 LAB — CBC WITH DIFFERENTIAL/PLATELET
Abs Immature Granulocytes: 0.05 10*3/uL (ref 0.00–0.07)
Basophils Absolute: 0 10*3/uL (ref 0.0–0.1)
Basophils Relative: 0 %
Eosinophils Absolute: 0.1 10*3/uL (ref 0.0–0.5)
Eosinophils Relative: 1 %
HCT: 39.6 % (ref 36.0–46.0)
Hemoglobin: 12.9 g/dL (ref 12.0–15.0)
Immature Granulocytes: 0 %
Lymphocytes Relative: 29 %
Lymphs Abs: 3.4 10*3/uL (ref 0.7–4.0)
MCH: 32.6 pg (ref 26.0–34.0)
MCHC: 32.6 g/dL (ref 30.0–36.0)
MCV: 100 fL (ref 80.0–100.0)
Monocytes Absolute: 0.5 10*3/uL (ref 0.1–1.0)
Monocytes Relative: 5 %
Neutro Abs: 7.5 10*3/uL (ref 1.7–7.7)
Neutrophils Relative %: 65 %
Platelets: 225 10*3/uL (ref 150–400)
RBC: 3.96 MIL/uL (ref 3.87–5.11)
RDW: 12.6 % (ref 11.5–15.5)
WBC: 11.5 10*3/uL — ABNORMAL HIGH (ref 4.0–10.5)
nRBC: 0 % (ref 0.0–0.2)

## 2018-10-16 LAB — TROPONIN I
Troponin I: <0.03 ng/mL (ref <0.03)
Troponin I: <0.03 ng/mL (ref <0.03)
Troponin I: <0.03 ng/mL (ref <0.03)
Troponin I: <0.03 ng/mL (ref <0.03)

## 2018-10-16 LAB — D-DIMER, QUANTITATIVE: D-Dimer, Quant: 0.76 ug/mL-FEU — ABNORMAL HIGH (ref 0.00–0.50)

## 2018-10-16 MED ORDER — ESTRADIOL 1 MG PO TABS
2.0000 mg | ORAL_TABLET | Freq: Every day | ORAL | Status: DC
  Administered 2018-10-16 – 2018-10-17 (×2): 2 mg via ORAL
  Filled 2018-10-16 (×2): qty 2

## 2018-10-16 MED ORDER — ALPRAZOLAM 0.25 MG PO TABS
0.2500 mg | ORAL_TABLET | Freq: Two times a day (BID) | ORAL | Status: DC | PRN
  Administered 2018-10-16: 0.25 mg via ORAL
  Filled 2018-10-16: qty 1

## 2018-10-16 MED ORDER — ONDANSETRON HCL 4 MG/2ML IJ SOLN
4.0000 mg | Freq: Once | INTRAMUSCULAR | Status: AC
  Administered 2018-10-16: 4 mg via INTRAVENOUS
  Filled 2018-10-16: qty 2

## 2018-10-16 MED ORDER — ONDANSETRON HCL 4 MG/2ML IJ SOLN
4.0000 mg | Freq: Four times a day (QID) | INTRAMUSCULAR | Status: DC | PRN
  Administered 2018-10-16: 4 mg via INTRAVENOUS
  Filled 2018-10-16: qty 2

## 2018-10-16 MED ORDER — IOPAMIDOL (ISOVUE-370) INJECTION 76%
100.0000 mL | Freq: Once | INTRAVENOUS | Status: AC | PRN
  Administered 2018-10-16: 75 mL via INTRAVENOUS

## 2018-10-16 MED ORDER — SODIUM CHLORIDE 0.9 % IV SOLN
INTRAVENOUS | Status: DC
  Administered 2018-10-16: 11:00:00 via INTRAVENOUS

## 2018-10-16 MED ORDER — ZOLPIDEM TARTRATE 5 MG PO TABS: 5.0000 mg | ORAL_TABLET | Freq: Every evening | ORAL | Status: DC | PRN

## 2018-10-16 MED ORDER — ASPIRIN-ACETAMINOPHEN-CAFFEINE 250-250-65 MG PO TABS
1.0000 | ORAL_TABLET | Freq: Four times a day (QID) | ORAL | Status: DC | PRN
  Filled 2018-10-16: qty 2

## 2018-10-16 MED ORDER — MORPHINE SULFATE (PF) 2 MG/ML IV SOLN
1.0000 mg | INTRAVENOUS | Status: DC | PRN
  Administered 2018-10-16: 1 mg via INTRAVENOUS
  Filled 2018-10-16: qty 1

## 2018-10-16 MED ORDER — ALUM & MAG HYDROXIDE-SIMETH 200-200-20 MG/5ML PO SUSP
30.0000 mL | Freq: Once | ORAL | Status: AC
  Administered 2018-10-16: 30 mL via ORAL
  Filled 2018-10-16: qty 30

## 2018-10-16 MED ORDER — ALBUTEROL SULFATE (2.5 MG/3ML) 0.083% IN NEBU: 2.5000 mg | INHALATION_SOLUTION | RESPIRATORY_TRACT | Status: DC | PRN

## 2018-10-16 MED ORDER — ASPIRIN EC 325 MG PO TBEC
325.0000 mg | DELAYED_RELEASE_TABLET | Freq: Every day | ORAL | Status: DC
  Administered 2018-10-17: 325 mg via ORAL
  Filled 2018-10-16: qty 1

## 2018-10-16 MED ORDER — ENOXAPARIN SODIUM 40 MG/0.4ML ~~LOC~~ SOLN: 40.0000 mg | SUBCUTANEOUS | Status: DC

## 2018-10-16 MED ORDER — PANTOPRAZOLE SODIUM 40 MG PO TBEC
40.0000 mg | DELAYED_RELEASE_TABLET | Freq: Two times a day (BID) | ORAL | Status: DC
  Administered 2018-10-16 – 2018-10-17 (×3): 40 mg via ORAL
  Filled 2018-10-16 (×3): qty 1

## 2018-10-16 MED ORDER — ASPIRIN 81 MG PO CHEW
324.0000 mg | CHEWABLE_TABLET | Freq: Once | ORAL | Status: AC
  Administered 2018-10-16: 324 mg via ORAL
  Filled 2018-10-16: qty 4

## 2018-10-16 MED ORDER — FENTANYL CITRATE (PF) 100 MCG/2ML IJ SOLN
12.5000 ug | Freq: Once | INTRAMUSCULAR | Status: AC
  Administered 2018-10-16: 12.5 ug via INTRAVENOUS
  Filled 2018-10-16: qty 2

## 2018-10-16 MED ORDER — ACETAMINOPHEN 325 MG PO TABS
650.0000 mg | ORAL_TABLET | ORAL | Status: DC | PRN
  Filled 2018-10-16: qty 2

## 2018-10-16 MED ORDER — SENNOSIDES-DOCUSATE SODIUM 8.6-50 MG PO TABS
1.0000 | ORAL_TABLET | Freq: Two times a day (BID) | ORAL | Status: DC
  Administered 2018-10-16: 1 via ORAL
  Filled 2018-10-16 (×2): qty 1

## 2018-10-16 MED ORDER — GABAPENTIN 400 MG PO CAPS
400.0000 mg | ORAL_CAPSULE | Freq: Every day | ORAL | Status: DC
  Administered 2018-10-16: 400 mg via ORAL
  Filled 2018-10-16: qty 1

## 2018-10-16 MED ORDER — KETOROLAC TROMETHAMINE 15 MG/ML IJ SOLN
15.0000 mg | Freq: Four times a day (QID) | INTRAMUSCULAR | Status: DC
  Administered 2018-10-16 (×2): 15 mg via INTRAVENOUS
  Filled 2018-10-16 (×2): qty 1

## 2018-10-16 MED ORDER — SODIUM CHLORIDE 0.9 % IV SOLN
INTRAVENOUS | Status: DC
  Administered 2018-10-16: 08:00:00 via INTRAVENOUS

## 2018-10-16 MED ORDER — NITROGLYCERIN 0.4 MG SL SUBL
0.4000 mg | SUBLINGUAL_TABLET | SUBLINGUAL | Status: DC | PRN
Start: 2018-10-16 — End: 2018-10-17
  Administered 2018-10-16: 0.4 mg via SUBLINGUAL
  Filled 2018-10-16: qty 1

## 2018-10-16 NOTE — ED Triage Notes (Signed)
Patient having chest pain and nausea. Patient states that the pain is in the center of her chest. Patient states that the chest pain woke her up from sleep.

## 2018-10-16 NOTE — ED Notes (Signed)
Pt given one Nitro tab. Refused second Nitro due to "burning under tongue".

## 2018-10-16 NOTE — ED Provider Notes (Signed)
[facility] EMERGENCY DEPARTMENT Provider Note   CSN: [id number] Arrival date & time: 10/16/18  0630     History   Chief Complaint Chief Complaint  Patient presents with  . Chest Pain    HPI [name] is a 52 y.o. female.  Patient with onset of substernal chest pain at 615 this morning associated with shortness of breath.  One episode of vomiting and there has been some nausea.  Yesterday felt fine other than some left arm numbness.  Pain does radiate to the back.  Patient states is 10 out of 10.  No prior history of pain like this.  No history of injury.  No known cardiac history.  No known cardiac risk factors.     Past Medical History:  Diagnosis Date  . Asthma    triggered by weather changes; no inhaler  . Cancer ([place])   . Fibromyalgia   . Ganglion cyst of wrist 12/2012   left dorsal cyst  . GERD (gastroesophageal reflux disease)    TUMS as needed  . Headache(784.0)    tension/migraines  . PONV (postoperative nausea and vomiting)    also states stopped breathing after hernia surgery  . Rectal bleeding   . Rheumatoid arthritis ([place])   . Stenosing tenosynovitis of finger 12/2012   left middle finger  . 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 Stiffness of joint, not elsewhere classified, ankle and foot 09/07/2014  . Pain in joint, ankle and foot 09/07/2014  . Difficulty in walking(719.7) 09/07/2014  . 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 Hematochezia 08/11/2013  . Constipation 08/11/2013  . RHEUMATOID ARTHRITIS 02/20/2010  . KNEE PAIN 02/20/2010  . FIBROMYALGIA 02/20/2010    Past Surgical History:  Procedure Laterality Date  . ABDOMINAL HYSTERECTOMY  1994  . APPENDECTOMY  1996  . COLONOSCOPY N/A 05/08/2013   Procedure: COLONOSCOPY;  Surgeon: Rogene Houston, MD;  [facility];  Service: Endoscopy;  Laterality: N/A;  240  . COLONOSCOPY N/A 09/23/2015   Procedure: COLONOSCOPY;   Surgeon: Rogene Houston, MD;  [facility];  Service: Endoscopy;  Laterality: N/A;  825  . CYSTOSCOPY WITH HYDRODISTENSION AND BIOPSY  04/01/2007  . [place]   left tube and ovary removed  . GANGLION CYST EXCISION  12/17/2012   Procedure: REMOVAL GANGLION OF WRIST;  Surgeon: Wynonia Sours, MD;  Location: [place];  Service: Orthopedics;  Laterality: Left;  . HARDWARE REMOVAL Right 08/09/2014   Procedure: Placement Right Ankle Tight Rope, Removal Plate and Screws;  Surgeon: Marybelle Killings, MD;  Location: [place];  Service: Orthopedics;  Laterality: Right;  . PUBOVAGINAL SLING  2010  . rt ankle surgery    . SALPINGOOPHORECTOMY  1996   right  . TRIGGER FINGER RELEASE  12/17/2012   Procedure: RELEASE TRIGGER FINGER/A-1 PULLEY;  Surgeon: Wynonia Sours, MD;  Location: [place];  Service: Orthopedics;  Laterality: Left;  . TUBAL LIGATION  1992  . VAGINA RECONSTRUCTION SURGERY     x 5 since 2010  . VENTRAL HERNIA REPAIR  09/06/2006     OB History    Gravida  3   Para  2   Term  2   Preterm  0   AB  1   Living  2     SAB      TAB  Ectopic  1   Multiple      Live Births  2            Home Medications    Prior to Admission medications   Medication Sig Start Date End Date Taking? Authorizing Provider  estradiol (ESTRACE) 2 MG tablet Take 1 tablet (2 mg total) by mouth daily. 03/03/18   Florian Buff, MD  gabapentin (NEURONTIN) 400 MG capsule Take 1 capsule (400 mg total) by mouth at bedtime. 12/25/16   Setzer, Rona Ravens, NP  Multiple Vitamins-Minerals (HAIR SKIN AND NAILS FORMULA) TABS Take 1 tablet by mouth daily.    [provider]  pantoprazole (PROTONIX) 40 MG tablet TAKE 1 TABLET (40 MG TOTAL) BY MOUTH DAILY. 10/06/18   Setzer, Rona Ravens, NP  PREMARIN vaginal cream PLACE 1 APPLICATORFUL VAGINALLY DAILY. 02/05/18   Florian Buff, MD  Vitamin D, Ergocalciferol, (DRISDOL) 50000 units CAPS capsule Take  5,000 Units by mouth daily.     [provider]    Family History Family History  Problem Relation Age of Onset  . Lung cancer Mother   . Cervical cancer Mother   . Heart disease Father   . Leukemia Father   . Cancer Maternal Grandmother        bladder and colon cancer    Social History Social History   Tobacco Use  . Smoking status: Never Smoker  . Smokeless tobacco: Never Used  Substance Use Topics  . Alcohol use: No  . Drug use: No     Allergies   Imitrex [sumatriptan]; Percocet [oxycodone-acetaminophen]; Cymbalta [duloxetine hcl]; Latex; Propoxyphene n-acetaminophen; and Tramadol   Review of Systems Review of Systems  Constitutional: Negative for fever.  HENT: Negative for congestion.   Eyes: Negative for visual disturbance.  Respiratory: Positive for shortness of breath.   Cardiovascular: Positive for chest pain.  Gastrointestinal: Positive for nausea and vomiting. Negative for abdominal pain.  Genitourinary: Positive for dysuria.  Musculoskeletal: Positive for back pain.  Skin: Negative for rash.  Neurological: Negative for headaches.  Hematological: Does not bruise/bleed easily.  Psychiatric/Behavioral: Negative for confusion.     Physical Exam Updated Vital Signs BP 107/70   Pulse 79   Temp 98.1 F (36.7 C) (Oral)   Resp 19   Ht 1.6 m (5\' 3" )   Wt 77.1 kg   SpO2 100%   BMI 30.11 kg/m   Physical Exam  Constitutional: She is oriented to person, place, and time. She appears well-developed and well-nourished. No distress.  HENT:  Head: Normocephalic and atraumatic.  Mouth/Throat: Oropharynx is clear and moist.  Eyes: Pupils are equal, round, and reactive to light. Conjunctivae and EOM are normal.  Neck: Neck supple.  Cardiovascular: Normal rate, regular rhythm and normal heart sounds.  Pulmonary/Chest: Effort normal and breath sounds normal. No respiratory distress.  Abdominal: Soft. Bowel sounds are normal. There is no tenderness.    Musculoskeletal: Normal range of motion. She exhibits deformity. She exhibits no edema.  Neurological: She is alert and oriented to person, place, and time. No cranial nerve deficit or sensory deficit. She exhibits normal muscle tone. Coordination normal.  Skin: Skin is warm.  Nursing note and vitals reviewed.    ED Treatments / Results  Labs (all labs ordered are listed, but only abnormal results are displayed) Labs Reviewed  COMPREHENSIVE METABOLIC PANEL - Abnormal; Notable for the following components:      Result Value   Calcium 8.7 (*)    All other components  within normal limits  CBC WITH DIFFERENTIAL/PLATELET - Abnormal; Notable for the following components:   WBC 11.5 (*)    All other components within normal limits  D-DIMER, QUANTITATIVE (NOT AT [facility]) - Abnormal; Notable for the following components:   D-Dimer, Quant 0.76 (*)    All other components within normal limits  TROPONIN I    EKG EKG Interpretation  Date/Time:  Thursday October 16 2018 06:39:07 EST Ventricular Rate:  98 PR Interval:    QRS Duration: 88 QT Interval:  336 QTC Calculation: 429 R Axis:   88 Text Interpretation:  Sinus rhythm Low voltage, precordial leads Abnormal R-wave progression, late transition Since last tracing rate faster 06 Sep 2014 Confirmed by Rolland Porter [phone]) on 10/16/2018 6:47:13 AM Also confirmed by Fredia Sorrow [phone])  on 10/16/2018 7:24:50 AM   Radiology Ct Angio Chest Pe W/cm &/or Wo Cm  Result Date: 10/16/2018 CLINICAL DATA:  Chest pain. EXAM: CT ANGIOGRAPHY CHEST WITH CONTRAST TECHNIQUE: Multidetector CT imaging of the chest was performed using the standard protocol during bolus administration of intravenous contrast. Multiplanar CT image reconstructions and MIPs were obtained to evaluate the vascular anatomy. CONTRAST:  51mL ISOVUE-370 IOPAMIDOL (ISOVUE-370) INJECTION 76% COMPARISON:  Radiograph of same day. FINDINGS: Cardiovascular: Satisfactory opacification of the  pulmonary arteries to the segmental level. No evidence of pulmonary embolism. Normal heart size. No pericardial effusion. Mediastinum/Nodes: No enlarged mediastinal, hilar, or axillary lymph nodes. Thyroid gland, trachea, and esophagus demonstrate no significant findings. Lungs/Pleura: Lungs are clear. No pleural effusion or pneumothorax. Upper Abdomen: No acute abnormality. Musculoskeletal: No chest wall abnormality. No acute or significant osseous findings. Review of the MIP images confirms the above findings. IMPRESSION: No definite evidence of pulmonary embolus. No acute abnormality seen in the chest. Electronically Signed   By: Marijo Conception, M.D.   On: 10/16/2018 08:54   Dg Chest Port 1 View  Result Date: 10/16/2018 CLINICAL DATA:  Central chest pain and nausea EXAM: PORTABLE CHEST 1 VIEW COMPARISON:  09/06/2014 FINDINGS: The lungs appear clear.  Cardiac and mediastinal contours normal. No pleural effusion identified. Mild degenerative AC joint spurring on the left. IMPRESSION: 1.  No active cardiopulmonary disease is radiographically apparent. 2. 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 Electronically Signed   By: Van Clines M.D.   On: 10/16/2018 07:30    Procedures Procedures (including critical care time)  Medications Ordered in ED Medications  0.9 %  sodium chloride infusion ( Intravenous New Bag/Given 10/16/18 0758)  nitroGLYCERIN (NITROSTAT) SL tablet 0.4 mg (0.4 mg Sublingual Given 10/16/18 0757)  ondansetron (ZOFRAN) injection 4 mg (has no administration in time range)  fentaNYL (SUBLIMAZE) injection 12.5 mcg (has no administration in time range)  ondansetron (ZOFRAN) injection 4 mg (4 mg Intravenous Given 10/16/18 0700)  aspirin chewable tablet 324 mg (324 mg Oral Given 10/16/18 0751)  iopamidol (ISOVUE-370) 76 % injection 100 mL (75 mLs Intravenous Contrast Given 10/16/18 0814)     Initial Impression / Assessment and Plan / ED Course  I have reviewed the triage  vital signs and the nursing notes.  Pertinent labs & imaging results that were available during my care of the patient were reviewed by me and considered in my medical decision making (see chart for details).   Patient is initial chest x-ray was negative EKG without acute changes.  Initial troponin negative.  But d-dimer was elevated.  Based on this patient had CT angios chest without any evidence of pulmonary embolism or any other  acute findings.  Patient was given aspirin and nitroglycerin after the first nitroglycerin because of the way it made her feel she refused any more but her pain did go from a 10 out of 10 down to a 3 out of 10.  Patient is very sensitive to medications.  We will try her with a little bit of fentanyl to get complete pain relief.  Patient is refusing any more nitro even as paced.  Will discuss with hospitalist for admission.  For cardiac rule out.    Final Clinical Impressions(s) / ED Diagnoses   Final diagnoses:  Precordial pain    ED Discharge Orders    None       Fredia Sorrow, MD 10/16/18 0945

## 2018-10-16 NOTE — H&P (Signed)
History and Physical  [name] VOH:[id number] DOB: [birth year] DOA: 10/16/2018  Referring physician: Rogene Houston PCP: Asencion Noble, MD   Chief Complaint: Chest pain   HPI: [name] is a 52 y.o. female presented to the emergency department early this morning complaining of sudden onset of substernal chest pain at around 6 AM associated with shortness of breath.  The patient also had some nausea and vomiting.  The patient says that yesterday she felt fine with the exception of some left arm numbness.  She says that the pain has been 10/10.  It has improved to 3/10 after nitroglycerin was given.  The patient says that she felt bad after taking the first nitroglycerin and is now refusing to take any further nitroglycerin.  The patient has no history of cough fever or rash.  The patient has no known cardiac history or no known risk factors for heart disease.  The patient was noted to have an elevated d-dimer in the ED and had a subsequent CTA chest with no acute findings of pulmonary embolus or other findings in the chest.  The patient is being admitted for observation to cycle troponins to rule out acute myocardial ischemia.  Review of Systems: All systems reviewed and apart from history of presenting illness, are negative.  Past Medical History:  Diagnosis Date  . Asthma    triggered by weather changes; no inhaler  . Cancer ([place])   . Fibromyalgia   . Ganglion cyst of wrist 12/2012   left dorsal cyst  . GERD (gastroesophageal reflux disease)    TUMS as needed  . Headache(784.0)    tension/migraines  . PONV (postoperative nausea and vomiting)    also states stopped breathing after hernia surgery  . Rectal bleeding   . Rheumatoid arthritis ([place])   . Stenosing tenosynovitis of finger 12/2012   left middle finger  . 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 ABDOMINAL HYSTERECTOMY  1994  . APPENDECTOMY  1996  . COLONOSCOPY  N/A 05/08/2013   Procedure: COLONOSCOPY;  Surgeon: Rogene Houston, MD;  [facility];  Service: Endoscopy;  Laterality: N/A;  240  . COLONOSCOPY N/A 09/23/2015   Procedure: COLONOSCOPY;  Surgeon: Rogene Houston, MD;  [facility];  Service: Endoscopy;  Laterality: N/A;  825  . CYSTOSCOPY WITH HYDRODISTENSION AND BIOPSY  04/01/2007  . [place]   left tube and ovary removed  . GANGLION CYST EXCISION  12/17/2012   Procedure: REMOVAL GANGLION OF WRIST;  Surgeon: Wynonia Sours, MD;  Location: [place];  Service: Orthopedics;  Laterality: Left;  . HARDWARE REMOVAL Right 08/09/2014   Procedure: Placement Right Ankle Tight Rope, Removal Plate and Screws;  Surgeon: Marybelle Killings, MD;  Location: [place];  Service: Orthopedics;  Laterality: Right;  . PUBOVAGINAL SLING  2010  . rt ankle surgery    . SALPINGOOPHORECTOMY  1996   right  . TRIGGER FINGER RELEASE  12/17/2012   Procedure: RELEASE TRIGGER FINGER/A-1 PULLEY;  Surgeon: Wynonia Sours, MD;  Location: [place];  Service: Orthopedics;  Laterality: Left;  . TUBAL LIGATION  1992  . VAGINA RECONSTRUCTION SURGERY     x 5 since 2010  . VENTRAL HERNIA REPAIR  09/06/2006   Social History:  reports that she has never smoked. She has never used smokeless tobacco. She reports that she does not  drink alcohol or use drugs.  Allergies  Allergen Reactions  . Imitrex [Sumatriptan]     Causes a migraine  . Percocet [Oxycodone-Acetaminophen] Other (See Comments)    Eyes dilate  . Cymbalta [Duloxetine Hcl] Rash  . Latex Rash  . Propoxyphene N-Acetaminophen Nausea And Vomiting and Rash  . Tramadol Rash    Family History  Problem Relation Age of Onset  . Lung cancer Mother   . Cervical cancer Mother   . Heart disease Father   . Leukemia Father   . Cancer Maternal Grandmother        bladder and colon cancer    Prior to Admission medications   Medication Sig Start Date End Date  Taking? Authorizing Provider  aspirin-acetaminophen-caffeine (EXCEDRIN MIGRAINE) [phone] MG tablet Take 1-2 tablets by mouth every 6 (six) hours as needed for headache.   Yes [provider]  Cholecalciferol (VITAMIN D3) 125 MCG (5000 UT) CAPS Take 1 capsule by mouth daily.   Yes [provider]  estradiol (ESTRACE) 2 MG tablet Take 1 tablet (2 mg total) by mouth daily. 03/03/18  Yes Florian Buff, MD  gabapentin (NEURONTIN) 400 MG capsule Take 1 capsule (400 mg total) by mouth at bedtime. 12/25/16  Yes Setzer, Terri L, NP  ibuprofen (ADVIL,MOTRIN) 200 MG tablet Take 400 mg by mouth 2 (two) times daily as needed.   Yes [provider]  pantoprazole (PROTONIX) 40 MG tablet TAKE 1 TABLET (40 MG TOTAL) BY MOUTH DAILY. 10/06/18  Yes Setzer, Terri L, NP  augmented betamethasone dipropionate (DIPROLENE-AF) 0.05 % cream Apply 1 application topically 2 (two) times daily as needed. 09/23/18   [provider]   Physical Exam: Vitals:   10/16/18 0730 10/16/18 0800 10/16/18 0803 10/16/18 0830  BP: 130/86 123/68 (!) 98/59 107/70  Pulse: 85 (!) 102 68 79  Resp: (!) 21 (!) 25 (!) 31 19  Temp:      TempSrc:      SpO2: 95%  100% 100%  Weight:      Height:         General exam: Moderately built and nourished patient, lying comfortably supine on the gurney in no obvious distress.  Head, eyes and ENT: Nontraumatic and normocephalic. Pupils equally reacting to light and accommodation. Oral mucosa moist.  Neck: Supple. No JVD, carotid bruit or thyromegaly.  Lymphatics: No lymphadenopathy.  Respiratory system: Clear to auscultation. No increased work of breathing.  Cardiovascular system: S1 and S2 heard, RRR. No JVD, murmurs, gallops, clicks or pedal edema.  Gastrointestinal system: Abdomen is nondistended, soft and nontender. Normal bowel sounds heard. No organomegaly or masses appreciated.  Central nervous system: Alert and oriented. No focal neurological  deficits.  Extremities: Symmetric 5 x 5 power. Peripheral pulses symmetrically felt.   Skin: No rashes or acute findings.  Musculoskeletal system: Negative exam.  Psychiatry: Pleasant and cooperative.  Labs on Admission:  Basic Metabolic Panel: Recent Labs  Lab 10/16/18 0650  NA 136  K 3.8  CL 104  CO2 24  GLUCOSE 99  BUN 11  CREATININE 0.72  CALCIUM 8.7*   Liver Function Tests: Recent Labs  Lab 10/16/18 0650  AST 20  ALT 13  ALKPHOS 57  BILITOT 0.4  PROT 6.9  ALBUMIN 3.6   No results for input(s): LIPASE, AMYLASE in the last 168 hours. No results for input(s): AMMONIA in the last 168 hours. CBC: Recent Labs  Lab 10/16/18 0650  WBC 11.5*  NEUTROABS 7.5  HGB 12.9  HCT  39.6  MCV 100.0  PLT 225   Cardiac Enzymes: Recent Labs  Lab 10/16/18 0650  TROPONINI <0.03    BNP (last 3 results) No results for input(s): PROBNP in the last 8760 hours. CBG: No results for input(s): GLUCAP in the last 168 hours.  Radiological Exams on Admission: Ct Angio Chest Pe W/cm &/or Wo Cm  Result Date: 10/16/2018 CLINICAL DATA:  Chest pain. EXAM: CT ANGIOGRAPHY CHEST WITH CONTRAST TECHNIQUE: Multidetector CT imaging of the chest was performed using the standard protocol during bolus administration of intravenous contrast. Multiplanar CT image reconstructions and MIPs were obtained to evaluate the vascular anatomy. CONTRAST:  19mL ISOVUE-370 IOPAMIDOL (ISOVUE-370) INJECTION 76% COMPARISON:  Radiograph of same day. FINDINGS: Cardiovascular: Satisfactory opacification of the pulmonary arteries to the segmental level. No evidence of pulmonary embolism. Normal heart size. No pericardial effusion. Mediastinum/Nodes: No enlarged mediastinal, hilar, or axillary lymph nodes. Thyroid gland, trachea, and esophagus demonstrate no significant findings. Lungs/Pleura: Lungs are clear. No pleural effusion or pneumothorax. Upper Abdomen: No acute abnormality. Musculoskeletal: No chest wall  abnormality. No acute or significant osseous findings. Review of the MIP images confirms the above findings. IMPRESSION: No definite evidence of pulmonary embolus. No acute abnormality seen in the chest. Electronically Signed   By: Marijo Conception, M.D.   On: 10/16/2018 08:54   Dg Chest Port 1 View  Result Date: 10/16/2018 CLINICAL DATA:  Central chest pain and nausea EXAM: PORTABLE CHEST 1 VIEW COMPARISON:  09/06/2014 FINDINGS: The lungs appear clear.  Cardiac and mediastinal contours normal. No pleural effusion identified. Mild degenerative AC joint spurring on the left. IMPRESSION: 1.  No active cardiopulmonary disease is radiographically apparent. 2. 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 Electronically Signed   By: Van Clines M.D.   On: 10/16/2018 07:30    EKG: Independently reviewed.   Assessment/Plan Active Problems:   Chest pain   1. Atypical chest pain- the patient had a CTA chest to rule out acute PE which is reassuring however with ongoing chest pain she will be placed in observation under continuous telemetry monitoring, cycle troponin to rule out acute myocardial ischemia, repeat EKG for any further chest pain events, Protonix twice daily has been ordered for GI protection in addition to a GI cocktail which is been ordered.  Continue aspirin, morphine and oxygen if needed.  Will order ketorolac for inflammation.    DVT Prophylaxis: lovenox Code Status: Full   Family Communication: patient   Disposition Plan: Home tomorrow if stable.    Time spent: 55 mins  Irwin Brakeman, MD [facility] Pager [phone]  If 7PM-7AM, please contact night-coverage [email or web address] Password [email or web address] 10/16/2018, 9:56 AM

## 2018-10-16 NOTE — Progress Notes (Signed)
**Note De-identified  Obfuscation** EKG complete and placed in patient chart 

## 2018-10-16 NOTE — Evaluation (Signed)
Physical Therapy Evaluation Patient Details Name: [name] MRN: [id number] DOB: 30-Aug-1966 Today's Date: 10/16/2018   History of Present Illness  [name] is a 52 y.o. female presented to the emergency department early this morning complaining of sudden onset of substernal chest pain at around 6 AM associated with shortness of breath.  The patient also had some nausea and vomiting.  The patient says that yesterday she felt fine with the exception of some left arm numbness.  She says that the pain has been 10/10.  It has improved to 3/10 after nitroglycerin was given.  The patient says that she felt bad after taking the first nitroglycerin and is now refusing to take any further nitroglycerin.  The patient has no history of cough fever or rash.  The patient has no known cardiac history or no known risk factors for heart disease.  The patient was noted to have an elevated d-dimer in the ED and had a subsequent CTA chest with no acute findings of pulmonary embolus or other findings in the chest.  The patient is being admitted for observation to cycle troponins to rule out acute myocardial ischemia.    Clinical Impression  Patient functioning at baseline for functional mobility and gait other than slightly slower than normal speed of cadence, able to ambulate to bathroom in room without problem and encouraged to ambulate with nursing staff and family members in hallways as tolerated while in hospital.  Plan:  Patient discharged from physical therapy to care of nursing for ambulation daily as tolerated for length of stay.    Follow Up Recommendations No PT follow up    Equipment Recommendations  None recommended by PT    Recommendations for Other Services       Precautions / Restrictions Precautions Precautions: None Restrictions Weight Bearing Restrictions: No      Mobility  Bed Mobility Overal bed mobility: Independent                Transfers Overall transfer level:  Independent                  Ambulation/Gait Ambulation/Gait assistance: Modified independent (Device/Increase time) Gait Distance (Feet): 100 Feet Assistive device: None Gait Pattern/deviations: WFL(Within Functional Limits) Gait velocity: decreased   General Gait Details: demonstrates good return for ambulating on level, inclined, and declined surfaces without loss of balance  Stairs            Wheelchair Mobility    Modified Rankin (Stroke Patients Only)       Balance Overall balance assessment: No apparent balance deficits (not formally assessed)                                           Pertinent Vitals/Pain Pain Assessment: 0-10 Pain Score: 1  Pain Location: chest, mostly left hand up to wrist Pain Descriptors / Indicators: Aching Pain Intervention(s): Limited activity within patient's tolerance;Monitored during session    Home Living Family/patient expects to be discharged to:: Private residence Living Arrangements: Spouse/significant other Available Help at Discharge: Family Type of Home: House Home Access: Ramped entrance     Home Layout: One level Home Equipment: None      Prior Function Level of Independence: Independent         Comments: community ambulator, drives, works     Journalist, newspaper  Extremity/Trunk Assessment   Upper Extremity Assessment Upper Extremity Assessment: Overall WFL for tasks assessed    Lower Extremity Assessment Lower Extremity Assessment: Overall WFL for tasks assessed    Cervical / Trunk Assessment Cervical / Trunk Assessment: Normal  Communication   Communication: No difficulties  Cognition Arousal/Alertness: Awake/alert Behavior During Therapy: WFL for tasks assessed/performed Overall Cognitive Status: Within Functional Limits for tasks assessed                                        General Comments      Exercises     Assessment/Plan    PT  Assessment Patent does not need any further PT services  PT Problem List         PT Treatment Interventions      PT Goals (Current goals can be found in the Care Plan section)  Acute Rehab PT Goals Patient Stated Goal: return home PT Goal Formulation: With patient/family Time For Goal Achievement: 10/16/18 Potential to Achieve Goals: Good    Frequency     Barriers to discharge        Co-evaluation               AM-PAC PT "6 Clicks" Daily Activity  Outcome Measure Difficulty turning over in bed (including adjusting bedclothes, sheets and blankets)?: None Difficulty moving from lying on back to sitting on the side of the bed? : None Difficulty sitting down on and standing up from a chair with arms (e.g., wheelchair, bedside commode, etc,.)?: None Help needed moving to and from a bed to chair (including a wheelchair)?: None Help needed walking in hospital room?: None Help needed climbing 3-5 steps with a railing? : None 6 Click Score: 24    End of Session   Activity Tolerance: Patient tolerated treatment well Patient left: in bed;with call bell/phone within reach;with family/visitor present Nurse Communication: Mobility status PT Visit Diagnosis: Unsteadiness on feet (R26.81);Other abnormalities of gait and mobility (R26.89);Muscle weakness (generalized) (M62.81)    Time: 8315-1761 PT Time Calculation (min) (ACUTE ONLY): 26 min   Charges:   PT Evaluation $PT Eval Moderate Complexity: 1 Mod PT Treatments $Therapeutic Activity: 23-37 mins        3:39 PM, 10/16/18 Lonell Grandchild, MPT Physical Therapist with [facility] 336 [phone] office [phone] mobile phone

## 2018-10-17 DIAGNOSIS — R072 Precordial pain: Secondary | ICD-10-CM | POA: Diagnosis not present

## 2018-10-17 DIAGNOSIS — K219 Gastro-esophageal reflux disease without esophagitis: Secondary | ICD-10-CM | POA: Diagnosis not present

## 2018-10-17 LAB — TROPONIN I: Troponin I: <0.03 ng/mL (ref <0.03)

## 2018-10-17 MED ORDER — PANTOPRAZOLE SODIUM 40 MG PO TBEC: 40.0000 mg | DELAYED_RELEASE_TABLET | Freq: Two times a day (BID) | ORAL | 3 refills | Status: DC

## 2018-10-17 NOTE — Discharge Summary (Addendum)
Physician Discharge Summary  [name] RSW:[id number] DOB: 30-Dec-1965 DOA: 10/16/2018  PCP: Asencion Noble, MD GI: Rehman  Admit date: 10/16/2018 Discharge date: 10/17/2018  Admitted From: Home  Disposition: Home   Recommendations for Outpatient Follow-up:  1. Follow up with PCP in 1 weeks 2. Follow up with GI Dr. Laural Golden in 2 weeks  Discharge Condition: STABLE   CODE STATUS: FULL    Brief Hospitalization Summary: Please see all hospital notes, images, labs for full details of the hospitalization.  HPI: [name] is a 52 y.o. female presented to the emergency department early this morning complaining of sudden onset of substernal chest pain at around 6 AM associated with shortness of breath.  The patient also had some nausea and vomiting.  The patient says that yesterday she felt fine with the exception of some left arm numbness.  She says that the pain has been 10/10.  It has improved to 3/10 after nitroglycerin was given.  The patient says that she felt bad after taking the first nitroglycerin and is now refusing to take any further nitroglycerin.  The patient has no history of cough fever or rash.  The patient has no known cardiac history or no known risk factors for heart disease.  The patient was noted to have an elevated d-dimer in the ED and had a subsequent CTA chest with no acute findings of pulmonary embolus or other findings in the chest.  The patient is being admitted for observation to cycle troponins to rule out acute myocardial ischemia.   The patient was admitted for observation and monitoring on telemetry which remained stable.  Troponins tested negative x 4 for any signs of acute myocardial ischemia.  Pt had symptoms more reflective of acid reflux.  She was advised to take her protonix twice daily, avoid NSAIDS, eat anti-reflux diet and follow up with her GI Dr. Laural Golden for recheck in 2 weeks.  Pt verbalized understanding.  Pt to follow up with PCP in 1 week for recheck.     Discharge Diagnoses:  Active Problems:   Chest pain  Discharge Instructions: Discharge Instructions    Call MD for:  difficulty breathing, headache or visual disturbances   Complete by:  As directed    Call MD for:  extreme fatigue   Complete by:  As directed    Call MD for:  persistant dizziness or light-headedness   Complete by:  As directed    Call MD for:  severe uncontrolled pain   Complete by:  As directed    Increase activity slowly   Complete by:  As directed      Allergies as of 10/17/2018      Reactions   Imitrex [sumatriptan]    Causes a migraine   Percocet [oxycodone-acetaminophen] Other (See Comments)   Eyes dilate   Cymbalta [duloxetine Hcl] Rash   Latex Rash   Propoxyphene N-acetaminophen Nausea And Vomiting, Rash   Tramadol Rash      Medication List    STOP taking these medications   ibuprofen 200 MG tablet Commonly known as:  ADVIL,MOTRIN     TAKE these medications   aspirin-acetaminophen-caffeine 250-250-65 MG tablet Commonly known as:  EXCEDRIN MIGRAINE Take 1-2 tablets by mouth every 6 (six) hours as needed for headache.   augmented betamethasone dipropionate 0.05 % cream Commonly known as:  DIPROLENE-AF Apply 1 application topically 2 (two) times daily as needed.   estradiol 2 MG tablet Commonly known as:  ESTRACE Take 1 tablet (2  mg total) by mouth daily.   gabapentin 400 MG capsule Commonly known as:  NEURONTIN Take 1 capsule (400 mg total) by mouth at bedtime.   pantoprazole 40 MG tablet Commonly known as:  PROTONIX Take 1 tablet (40 mg total) by mouth 2 (two) times daily before a meal. What changed:  when to take this   Vitamin D3 125 MCG (5000 UT) Caps Take 1 capsule by mouth daily.      Follow-up Information    Asencion Noble, MD. Schedule an appointment as soon as possible for a visit in 1 week(s).   Specialty:  Internal Medicine Contact information: [address] [place] [address] [phone]         Rogene Houston, MD. Schedule an appointment as soon as possible for a visit in 2 week(s).   Specialty:  Gastroenterology Why:  Hospital Follow Up  Contact information: [address], [address] [place] [address] [phone]          Allergies  Allergen Reactions  . Imitrex [Sumatriptan]     Causes a migraine  . Percocet [Oxycodone-Acetaminophen] Other (See Comments)    Eyes dilate  . Cymbalta [Duloxetine Hcl] Rash  . Latex Rash  . Propoxyphene N-Acetaminophen Nausea And Vomiting and Rash  . Tramadol Rash   Allergies as of 10/17/2018      Reactions   Imitrex [sumatriptan]    Causes a migraine   Percocet [oxycodone-acetaminophen] Other (See Comments)   Eyes dilate   Cymbalta [duloxetine Hcl] Rash   Latex Rash   Propoxyphene N-acetaminophen Nausea And Vomiting, Rash   Tramadol Rash      Medication List    STOP taking these medications   ibuprofen 200 MG tablet Commonly known as:  ADVIL,MOTRIN     TAKE these medications   aspirin-acetaminophen-caffeine 354-656-81 MG tablet Commonly known as:  EXCEDRIN MIGRAINE Take 1-2 tablets by mouth every 6 (six) hours as needed for headache.   augmented betamethasone dipropionate 0.05 % cream Commonly known as:  DIPROLENE-AF Apply 1 application topically 2 (two) times daily as needed.   estradiol 2 MG tablet Commonly known as:  ESTRACE Take 1 tablet (2 mg total) by mouth daily.   gabapentin 400 MG capsule Commonly known as:  NEURONTIN Take 1 capsule (400 mg total) by mouth at bedtime.   pantoprazole 40 MG tablet Commonly known as:  PROTONIX Take 1 tablet (40 mg total) by mouth 2 (two) times daily before a meal. What changed:  when to take this   Vitamin D3 125 MCG (5000 UT) Caps Take 1 capsule by mouth daily.       Procedures/Studies: Ct Angio Chest Pe W/cm &/or Wo Cm  Result Date: 10/16/2018 CLINICAL DATA:  Chest pain. EXAM: CT ANGIOGRAPHY CHEST WITH CONTRAST TECHNIQUE: Multidetector CT imaging of the  chest was performed using the standard protocol during bolus administration of intravenous contrast. Multiplanar CT image reconstructions and MIPs were obtained to evaluate the vascular anatomy. CONTRAST:  73mL ISOVUE-370 IOPAMIDOL (ISOVUE-370) INJECTION 76% COMPARISON:  Radiograph of same day. FINDINGS: Cardiovascular: Satisfactory opacification of the pulmonary arteries to the segmental level. No evidence of pulmonary embolism. Normal heart size. No pericardial effusion. Mediastinum/Nodes: No enlarged mediastinal, hilar, or axillary lymph nodes. Thyroid gland, trachea, and esophagus demonstrate no significant findings. Lungs/Pleura: Lungs are clear. No pleural effusion or pneumothorax. Upper Abdomen: No acute abnormality. Musculoskeletal: No chest wall abnormality. No acute or significant osseous findings. Review of the MIP images confirms the above findings. IMPRESSION:  No definite evidence of pulmonary embolus. No acute abnormality seen in the chest. Electronically Signed   By: Marijo Conception, M.D.   On: 10/16/2018 08:54   Dg Chest Port 1 View  Result Date: 10/16/2018 CLINICAL DATA:  Central chest pain and nausea EXAM: PORTABLE CHEST 1 VIEW COMPARISON:  09/06/2014 FINDINGS: The lungs appear clear.  Cardiac and mediastinal contours normal. No pleural effusion identified. Mild degenerative AC joint spurring on the left. IMPRESSION: 1.  No active cardiopulmonary disease is radiographically apparent. 2. 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 Electronically Signed   By: Van Clines M.D.   On: 10/16/2018 07:30      Subjective: Pt says she has acid reflux symptoms but no chest pain, no SOB, tolerating diet and wants to go home.    Discharge Exam: Vitals:   10/17/18 0035 10/17/18 0431  BP: 111/66 (!) 106/52  Pulse: 69 62  Resp: 16   Temp: 98.1 F (36.7 C) 98.1 F (36.7 C)  SpO2: 95% 95%   Vitals:   10/16/18 1614 10/16/18 2030 10/17/18 0035 10/17/18 0431  BP: 106/65 (!) 113/56  111/66 (!) 106/52  Pulse: 74 69 69 62  Resp: 17  16   Temp: 98.6 F (37 C) 98.2 F (36.8 C) 98.1 F (36.7 C) 98.1 F (36.7 C)  TempSrc:  Oral Oral Oral  SpO2: 96% 95% 95% 95%  Weight:      Height:        General: Pt is alert, awake, not in acute distress Cardiovascular: RRR, S1/S2 +, no rubs, no gallops Respiratory: CTA bilaterally, no wheezing, no rhonchi Abdominal: Soft, NT, ND, bowel sounds + Extremities: no edema, no cyanosis   The results of significant diagnostics from this hospitalization (including imaging, microbiology, ancillary and laboratory) are listed below for reference.     Microbiology: No results found for this or any previous visit (from the past 240 hour(s)).   Labs: BNP (last 3 results) No results for input(s): BNP in the last 8760 hours. Basic Metabolic Panel: Recent Labs  Lab 10/16/18 0650  NA 136  K 3.8  CL 104  CO2 24  GLUCOSE 99  BUN 11  CREATININE 0.72  CALCIUM 8.7*   Liver Function Tests: Recent Labs  Lab 10/16/18 0650  AST 20  ALT 13  ALKPHOS 57  BILITOT 0.4  PROT 6.9  ALBUMIN 3.6   No results for input(s): LIPASE, AMYLASE in the last 168 hours. No results for input(s): AMMONIA in the last 168 hours. CBC: Recent Labs  Lab 10/16/18 0650  WBC 11.5*  NEUTROABS 7.5  HGB 12.9  HCT 39.6  MCV 100.0  PLT 225   Cardiac Enzymes: Recent Labs  Lab 10/16/18 0650 10/16/18 1008 10/16/18 1345 10/16/18 1716 10/16/18 2343  TROPONINI <0.03 <0.03 <0.03 <0.03 <0.03   BNP: Invalid input(s): POCBNP CBG: No results for input(s): GLUCAP in the last 168 hours. D-Dimer Recent Labs    10/16/18 0650  DDIMER 0.76*   Hgb A1c No results for input(s): HGBA1C in the last 72 hours. Lipid Profile No results for input(s): CHOL, HDL, LDLCALC, TRIG, CHOLHDL, LDLDIRECT in the last 72 hours. Thyroid function studies No results for input(s): TSH, T4TOTAL, T3FREE, THYROIDAB in the last 72 hours.  Invalid input(s): FREET3 Anemia work  up No results for input(s): VITAMINB12, FOLATE, FERRITIN, TIBC, IRON, RETICCTPCT in the last 72 hours. Urinalysis    Component Value Date/Time   COLORURINE YELLOW 03/14/2017 0818   APPEARANCEUR CLEAR 03/14/2017 0818  LABSPEC 1.023 03/14/2017 0818   PHURINE 5.0 03/14/2017 0818   GLUCOSEU NEGATIVE 03/14/2017 0818   HGBUR SMALL (A) 03/14/2017 0818   BILIRUBINUR NEGATIVE 03/14/2017 0818   KETONESUR NEGATIVE 03/14/2017 0818   PROTEINUR NEGATIVE 03/14/2017 0818   UROBILINOGEN 0.2 10/14/2013 1144   NITRITE NEGATIVE 03/14/2017 0818   LEUKOCYTESUR NEGATIVE 03/14/2017 0818   Sepsis Labs Invalid input(s): PROCALCITONIN,  WBC,  LACTICIDVEN Microbiology No results found for this or any previous visit (from the past 240 hour(s)).  Time coordinating discharge:   SIGNED:  Irwin Brakeman, MD  [facility] 10/17/2018, 9:13 AM Pager [phone]  If 7PM-7AM, please contact night-coverage [email or web address] Password [email or web address]

## 2018-10-17 NOTE — Discharge Instructions (Signed)
Chest Wall Pain Chest wall pain is pain in or around the bones and muscles of your chest. Sometimes, an injury causes this pain. Sometimes, the cause may not be known. This pain may take several weeks or longer to get better. Follow these instructions at home: Pay attention to any changes in your symptoms. Take these actions to help with your pain:  Rest as told by your health care provider.  Avoid activities that cause pain. These include any activities that use your chest muscles or your abdominal and side muscles to lift heavy items.  If directed, apply ice to the painful area: ? Put ice in a plastic bag. ? Place a towel between your skin and the bag. ? Leave the ice on for 20 minutes, 2-3 times per day.  Take over-the-counter and prescription medicines only as told by your health care provider.  Do not use tobacco products, including cigarettes, chewing tobacco, and e-cigarettes. If you need help quitting, ask your health care provider.  Keep all follow-up visits as told by your health care provider. This is important.  Contact a health care provider if:  You have a fever.  Your chest pain becomes worse.  You have new symptoms. Get help right away if:  You have nausea or vomiting.  You feel sweaty or light-headed.  You have a cough with phlegm (sputum) or you cough up blood.  You develop shortness of breath. This information is not intended to replace advice given to you by your health care provider. Make sure you discuss any questions you have with your health care provider. Document Released: 11/26/2005 Document Revised: 04/05/2016 Document Reviewed: 02/21/2015 Elsevier Interactive Patient Education  2018 [employer].  Gastroesophageal Reflux Disease, Adult Normally, food travels down the esophagus and stays in the stomach to be digested. If a person has gastroesophageal reflux disease (GERD), food and stomach acid move back up into the esophagus. When this happens,  the esophagus becomes sore and swollen (inflamed). Over time, GERD can make small holes (ulcers) in the lining of the esophagus. Follow these instructions at home: Diet  Follow a diet as told by your doctor. You may need to avoid foods and drinks such as: ? Coffee and tea (with or without caffeine). ? Drinks that contain alcohol. ? Energy drinks and sports drinks. ? Carbonated drinks or sodas. ? Chocolate and cocoa. ? Peppermint and mint flavorings. ? Garlic and onions. ? Horseradish. ? Spicy and acidic foods, such as peppers, chili powder, curry powder, vinegar, hot sauces, and BBQ sauce. ? Citrus fruit juices and citrus fruits, such as oranges, lemons, and limes. ? Tomato-based foods, such as red sauce, chili, salsa, and pizza with red sauce. ? Fried and fatty foods, such as donuts, french fries, potato chips, and high-fat dressings. ? High-fat meats, such as hot dogs, rib eye steak, sausage, ham, and bacon. ? High-fat dairy items, such as whole milk, butter, and cream cheese.  Eat small meals often. Avoid eating large meals.  Avoid drinking large amounts of liquid with your meals.  Avoid eating meals during the 2-3 hours before bedtime.  Avoid lying down right after you eat.  Do not exercise right after you eat. General instructions  Pay attention to any changes in your symptoms.  Take over-the-counter and prescription medicines only as told by your doctor. Do not take aspirin, ibuprofen, or other NSAIDs unless your doctor says it is okay.  Do not use any tobacco products, including cigarettes, chewing tobacco, and e-cigarettes. If  you need help quitting, ask your doctor.  Wear loose clothes. Do not wear anything tight around your waist.  Raise (elevate) the head of your bed about 6 inches (15 cm).  Try to lower your stress. If you need help doing this, ask your doctor.  If you are overweight, lose an amount of weight that is healthy for you. Ask your doctor about a safe  weight loss goal.  Keep all follow-up visits as told by your doctor. This is important. Contact a doctor if:  You have new symptoms.  You lose weight and you do not know why it is happening.  You have trouble swallowing, or it hurts to swallow.  You have wheezing or a cough that keeps happening.  Your symptoms do not get better with treatment.  You have a hoarse voice. Get help right away if:  You have pain in your arms, neck, jaw, teeth, or back.  You feel sweaty, dizzy, or light-headed.  You have chest pain or shortness of breath.  You throw up (vomit) and your throw up looks like blood or coffee grounds.  You pass out (faint).  Your poop (stool) is bloody or black.  You cannot swallow, drink, or eat. This information is not intended to replace advice given to you by your health care provider. Make sure you discuss any questions you have with your health care provider. Document Released: 05/14/2008 Document Revised: 05/03/2016 Document Reviewed: 03/23/2015 Elsevier Interactive Patient Education  2018 [employer] for Gastroesophageal Reflux Disease, Adult When you have gastroesophageal reflux disease (GERD), the foods you eat and your eating habits are very important. Choosing the right foods can help ease your discomfort. What guidelines do I need to follow?  Choose fruits, vegetables, whole grains, and low-fat dairy products.  Choose low-fat meat, fish, and poultry.  Limit fats such as oils, salad dressings, butter, nuts, and avocado.  Keep a food diary. This helps you identify foods that cause symptoms.  Avoid foods that cause symptoms. These may be different for everyone.  Eat small meals often instead of 3 large meals a day.  Eat your meals slowly, in a place where you are relaxed.  Limit fried foods.  Cook foods using methods other than frying.  Avoid drinking alcohol.  Avoid drinking large amounts of liquids with your meals.  Avoid  bending over or lying down until 2-3 hours after eating. What foods are not recommended? These are some foods and drinks that may make your symptoms worse: Vegetables Tomatoes. Tomato juice. Tomato and spaghetti sauce. Chili peppers. Onion and garlic. Horseradish. Fruits Oranges, grapefruit, and lemon (fruit and juice). Meats High-fat meats, fish, and poultry. This includes hot dogs, ribs, ham, sausage, salami, and bacon. Dairy Whole milk and chocolate milk. Sour cream. Cream. Butter. Ice cream. Cream cheese. Drinks Coffee and tea. Bubbly (carbonated) drinks or energy drinks. Condiments Hot sauce. Barbecue sauce. Sweets/Desserts Chocolate and cocoa. Donuts. Peppermint and spearmint. Fats and Oils High-fat foods. This includes [place] fries and potato chips. Other Vinegar. Strong spices. This includes black pepper, white pepper, red pepper, cayenne, curry powder, cloves, ginger, and chili powder. The items listed above may not be a complete list of foods and drinks to avoid. Contact your dietitian for more information. This information is not intended to replace advice given to you by your health care provider. Make sure you discuss any questions you have with your health care provider. Document Released: 05/27/2012 Document Revised: 05/03/2016 Document Reviewed: 09/30/2013 Elsevier  Interactive Patient Education  2017 [place]   Follow with Primary MD  Jaclyn Noble, MD  and other consultant's as instructed your Hospitalist MD  Please get a complete blood count and chemistry panel checked by your Primary MD at your next visit, and again as instructed by your Primary MD.  Get Medicines reviewed and adjusted: Please take all your medications with you for your next visit with your Primary MD  Laboratory/radiological data: Please request your Primary MD to go over all hospital tests and procedure/radiological results at the follow up, please ask your Primary MD to get all Hospital  records sent to his/her office.  In some cases, they will be blood work, cultures and biopsy results pending at the time of your discharge. Please request that your primary care M.D. follows up on these results.  Also Note the following: If you experience worsening of your admission symptoms, develop shortness of breath, life threatening emergency, suicidal or homicidal thoughts you must seek medical attention immediately by calling 911 or calling your MD immediately  if symptoms less severe.  You must read complete instructions/literature along with all the possible adverse reactions/side effects for all the Medicines you take and that have been prescribed to you. Take any new Medicines after you have completely understood and accpet all the possible adverse reactions/side effects.   Do not drive when taking Pain medications or sleeping medications (Benzodaizepines)  Do not take more than prescribed Pain, Sleep and Anxiety Medications. It is not advisable to combine anxiety,sleep and pain medications without talking with your primary care practitioner  Special Instructions: If you have smoked or chewed Tobacco  in the last 2 yrs please stop smoking, stop any regular Alcohol  and or any Recreational drug use.  Wear Seat belts while driving.  Please note: You were cared for by a hospitalist during your hospital stay. Once you are discharged, your primary care physician will handle any further medical issues. Please note that NO REFILLS for any discharge medications will be authorized once you are discharged, as it is imperative that you return to your primary care physician (or establish a relationship with a primary care physician if you do not have one) for your post hospital discharge needs so that they can reassess your need for medications and monitor your lab values.

## 2018-10-17 NOTE — Progress Notes (Signed)
Pt's IV catheter removed and intact. Pt's IV site clean dry and intact. Discharge instructions including medications and follow up appointments were reviewed and discussed with patient. All questions were answered and no further questions at this time. Pt in stable condition and in no acute distress at time of discharge. Pt escorted by nurse tech 

## 2018-10-18 LAB — HIV ANTIBODY (ROUTINE TESTING W REFLEX): HIV Screen 4th Generation wRfx: NONREACTIVE

## 2018-10-23 ENCOUNTER — Encounter (INDEPENDENT_AMBULATORY_CARE_PROVIDER_SITE_OTHER): Payer: Self-pay | Admitting: Internal Medicine

## 2018-10-23 ENCOUNTER — Ambulatory Visit (INDEPENDENT_AMBULATORY_CARE_PROVIDER_SITE_OTHER): Payer: BLUE CROSS/BLUE SHIELD | Admitting: Internal Medicine

## 2018-10-23 ENCOUNTER — Encounter (INDEPENDENT_AMBULATORY_CARE_PROVIDER_SITE_OTHER): Payer: Self-pay | Admitting: *Deleted

## 2018-10-23 VITALS — BP 160/80 | HR 72 | Temp 98.0°F | Ht 63.0 in | Wt 177.8 lb

## 2018-10-23 DIAGNOSIS — K219 Gastro-esophageal reflux disease without esophagitis: Secondary | ICD-10-CM | POA: Insufficient documentation

## 2018-10-23 DIAGNOSIS — R1013 Epigastric pain: Secondary | ICD-10-CM | POA: Diagnosis not present

## 2018-10-23 DIAGNOSIS — R1010 Upper abdominal pain, unspecified: Secondary | ICD-10-CM | POA: Insufficient documentation

## 2018-10-23 NOTE — Patient Instructions (Signed)
The risks of bleeding, perforation and infection were reviewed with patient.  

## 2018-10-23 NOTE — Progress Notes (Signed)
Subjective:    Patient ID: [name], female    DOB: 02/23/1966, 52 y.o.   MRN: [id number]  HPI Here today after recent trip to the ED for chest pain 10/16/2018. Admitted overnight.  Troponin's were normal. Had a sudden onset of chest pain. She was SOB. Some nausea. Pain 10/10.  Improved after NTG. CT angio chest PE w/or Wo CM; IMPRESSION: No definite evidence of pulmonary embolus. No acute abnormality seen in the chest. Hx of GERD and constipation. Her last colonoscopy was in 2016.  Had been taking Motrin 400mg  x 2  twice a day. Had been taking Motrin for a year or 2 for foot pain.  Her BMs are moving for the most part okay. Has been off the Motrin x 2 weeks. She says her chest still hurts but not as back. She is having some epigastric pain. GERD controlled with Protonix daily. Also taking Omeprazole.  No melena.  Normal appetite.   09/23/2015 Colonoscopy  Indications:Patient is 52 year old Caucasian female who underwent colonoscopy in May 2014 with removal of 4 small polyps and these are tubular adenomas. Patient now presents with frequent hematochezia. She passes fresh blood and clots. She says this occurs even when she is not constipated. Her bowels are irregular and at times she may go weeks without a bowel movement. Family history significant for CRC in maternal grandmother who died at 35 of postop complications.  Findings:  Prep excellent. Normal mucosa of terminal ileum. Single small diverticulum noted at proximal transverse colon otherwise mucosa of cecum ascending colon hepatic flexure, transverse colon and splenic flexure as well as descending and sigmoid colon was normal. Normal rectal mucosa. Small hemorrhoids below the dentate line along with focal erythema.   05/08/2013 Colonoscopy: Dr. Laural Golden: rectal bleeding:  CBC    Component Value Date/Time   WBC 11.5 (H) 10/16/2018 0650   RBC 3.96 10/16/2018 0650   HGB 12.9 10/16/2018 0650   HCT 39.6 10/16/2018  0650   PLT 225 10/16/2018 0650   MCV 100.0 10/16/2018 0650   MCH 32.6 10/16/2018 0650   MCHC 32.6 10/16/2018 0650   RDW 12.6 10/16/2018 0650   LYMPHSABS 3.4 10/16/2018 0650   MONOABS 0.5 10/16/2018 0650   EOSABS 0.1 10/16/2018 0650   BASOSABS 0.0 10/16/2018 0650      Review of Systems Past Medical History:  Diagnosis Date  . Asthma    triggered by weather changes; no inhaler  . Cancer ([place])   . Fibromyalgia   . Ganglion cyst of wrist 12/2012   left dorsal cyst  . GERD (gastroesophageal reflux disease)    TUMS as needed  . Headache(784.0)    tension/migraines  . PONV (postoperative nausea and vomiting)    also states stopped breathing after hernia surgery  . Rectal bleeding   . Rheumatoid arthritis ([place])   . Stenosing tenosynovitis of finger 12/2012   left middle finger  . 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 ABDOMINAL HYSTERECTOMY  1994  . APPENDECTOMY  1996  . COLONOSCOPY N/A 05/08/2013   Procedure: COLONOSCOPY;  Surgeon: Rogene Houston, MD;  [facility];  Service: Endoscopy;  Laterality: N/A;  240  . COLONOSCOPY N/A 09/23/2015   Procedure: COLONOSCOPY;  Surgeon: Rogene Houston, MD;  [facility];  Service: Endoscopy;  Laterality: N/A;  825  . CYSTOSCOPY WITH HYDRODISTENSION AND BIOPSY  04/01/2007  . ECTOPIC PREGNANCY SURGERY  1995   left tube and ovary removed  . GANGLION CYST EXCISION  12/17/2012   Procedure: REMOVAL GANGLION OF WRIST;  Surgeon: Wynonia Sours, MD;  Location: [place];  Service: Orthopedics;  Laterality: Left;  . HARDWARE REMOVAL Right 08/09/2014   Procedure: Placement Right Ankle Tight Rope, Removal Plate and Screws;  Surgeon: Marybelle Killings, MD;  Location: [place];  Service: Orthopedics;  Laterality: Right;  . PUBOVAGINAL SLING  2010  . rt ankle surgery    . SALPINGOOPHORECTOMY  1996   right  . TRIGGER FINGER RELEASE  12/17/2012   Procedure: RELEASE  TRIGGER FINGER/A-1 PULLEY;  Surgeon: Wynonia Sours, MD;  Location: [place];  Service: Orthopedics;  Laterality: Left;  . TUBAL LIGATION  1992  . VAGINA RECONSTRUCTION SURGERY     x 5 since 2010  . VENTRAL HERNIA REPAIR  09/06/2006    Allergies  Allergen Reactions  . Imitrex [Sumatriptan]     Causes a migraine  . Percocet [Oxycodone-Acetaminophen] Other (See Comments)    Eyes dilate  . Cymbalta [Duloxetine Hcl] Rash  . Latex Rash  . Propoxyphene N-Acetaminophen Nausea And Vomiting and Rash  . Tramadol Rash    Current Outpatient Medications on File Prior to Visit  Medication Sig Dispense Refill  . aspirin-acetaminophen-caffeine (EXCEDRIN MIGRAINE) 250-250-65 MG tablet Take 1-2 tablets by mouth every 6 (six) hours as needed for headache.    . augmented betamethasone dipropionate (DIPROLENE-AF) 0.05 % cream Apply 1 application topically 2 (two) times daily as needed.  3  . Cholecalciferol (VITAMIN D3) 125 MCG (5000 UT) CAPS Take 1 capsule by mouth daily.    Marland Kitchen estradiol (ESTRACE) 2 MG tablet Take 1 tablet (2 mg total) by mouth daily. 90 tablet 4  . gabapentin (NEURONTIN) 400 MG capsule Take 1 capsule (400 mg total) by mouth at bedtime. 90 capsule 0  . ibuprofen (ADVIL,MOTRIN) 400 MG tablet Take 400 mg by mouth every 6 (six) hours as needed.    Marland Kitchen omeprazole (PRILOSEC) 20 MG capsule Take 20 mg by mouth daily.    . pantoprazole (PROTONIX) 40 MG tablet Take 1 tablet (40 mg total) by mouth 2 (two) times daily before a meal. 90 tablet 3   No current facility-administered medications on file prior to visit.         Objective:   Physical Exam Blood pressure (!) 160/80, pulse 72, temperature 98 F (36.7 C), height 5\' 3"  (1.6 m), weight 177 lb 12.8 oz (80.6 kg). Alert and oriented. Skin warm and dry. Oral mucosa is moist.   . Sclera anicteric, conjunctivae is pink. Thyroid not enlarged. No cervical lymphadenopathy. Lungs clear. Heart regular rate and rhythm.  Abdomen is soft.  Bowel sounds are positive. No hepatomegaly. No abdominal masses felt. No tenderness.  No edema to lower extremities.           Assessment & Plan:  GERD. Has been taking Motrin 1600mg  a day. PUD needs to be ruled. Has not taking in about 2 weeks.  EGD. The risks of bleeding, perforation and infection were reviewed with patient.

## 2018-11-05 ENCOUNTER — Encounter (INDEPENDENT_AMBULATORY_CARE_PROVIDER_SITE_OTHER): Payer: Self-pay

## 2018-12-01 ENCOUNTER — Other Ambulatory Visit: Payer: Self-pay

## 2018-12-01 ENCOUNTER — Encounter (HOSPITAL_COMMUNITY)
Admission: RE | Disposition: A | Discharge status: Home / Self Care | Payer: Self-pay | Source: Ambulatory Visit | Attending: Internal Medicine

## 2018-12-01 ENCOUNTER — Ambulatory Visit (HOSPITAL_COMMUNITY)
Admission: RE | Admit: 2018-12-01 | Discharge: 2018-12-01 | Disposition: A | Discharge status: Home / Self Care | Payer: BLUE CROSS/BLUE SHIELD | Source: Ambulatory Visit | Attending: Internal Medicine | Admitting: Internal Medicine

## 2018-12-01 DIAGNOSIS — Z79899 Other long term (current) drug therapy: Secondary | ICD-10-CM | POA: Diagnosis not present

## 2018-12-01 DIAGNOSIS — K3189 Other diseases of stomach and duodenum: Secondary | ICD-10-CM | POA: Insufficient documentation

## 2018-12-01 DIAGNOSIS — R1010 Upper abdominal pain, unspecified: Secondary | ICD-10-CM | POA: Insufficient documentation

## 2018-12-01 DIAGNOSIS — Z885 Allergy status to narcotic agent status: Secondary | ICD-10-CM | POA: Insufficient documentation

## 2018-12-01 DIAGNOSIS — Z8541 Personal history of malignant neoplasm of cervix uteri: Secondary | ICD-10-CM | POA: Insufficient documentation

## 2018-12-01 DIAGNOSIS — J45909 Unspecified asthma, uncomplicated: Secondary | ICD-10-CM | POA: Insufficient documentation

## 2018-12-01 DIAGNOSIS — M069 Rheumatoid arthritis, unspecified: Secondary | ICD-10-CM | POA: Diagnosis not present

## 2018-12-01 DIAGNOSIS — K228 Other specified diseases of esophagus: Secondary | ICD-10-CM | POA: Diagnosis not present

## 2018-12-01 DIAGNOSIS — K219 Gastro-esophageal reflux disease without esophagitis: Secondary | ICD-10-CM | POA: Diagnosis not present

## 2018-12-01 DIAGNOSIS — Z888 Allergy status to other drugs, medicaments and biological substances status: Secondary | ICD-10-CM | POA: Diagnosis not present

## 2018-12-01 DIAGNOSIS — Z7982 Long term (current) use of aspirin: Secondary | ICD-10-CM | POA: Insufficient documentation

## 2018-12-01 DIAGNOSIS — M797 Fibromyalgia: Secondary | ICD-10-CM | POA: Diagnosis not present

## 2018-12-01 DIAGNOSIS — R1013 Epigastric pain: Secondary | ICD-10-CM | POA: Insufficient documentation

## 2018-12-01 DIAGNOSIS — R079 Chest pain, unspecified: Secondary | ICD-10-CM | POA: Diagnosis not present

## 2018-12-01 DIAGNOSIS — Z791 Long term (current) use of non-steroidal anti-inflammatories (NSAID): Secondary | ICD-10-CM | POA: Insufficient documentation

## 2018-12-01 HISTORY — PX: ESOPHAGOGASTRODUODENOSCOPY: SHX5428

## 2018-12-01 HISTORY — PX: BIOPSY: SHX5522

## 2018-12-01 SURGERY — EGD (ESOPHAGOGASTRODUODENOSCOPY)
Anesthesia: Moderate Sedation

## 2018-12-01 MED ORDER — SODIUM CHLORIDE 0.9 % IV SOLN
INTRAVENOUS | Status: DC
  Administered 2018-12-01: 10:00:00 via INTRAVENOUS

## 2018-12-01 MED ORDER — MEPERIDINE HCL 50 MG/ML IJ SOLN
INTRAMUSCULAR | Status: DC | PRN
  Administered 2018-12-01 (×2): 25 mg via INTRAVENOUS

## 2018-12-01 MED ORDER — ONDANSETRON HCL 4 MG/2ML IJ SOLN
INTRAMUSCULAR | Status: DC | PRN
  Administered 2018-12-01: 4 mg via INTRAVENOUS

## 2018-12-01 MED ORDER — LIDOCAINE VISCOUS HCL 2 % MT SOLN
OROMUCOSAL | Status: AC
  Filled 2018-12-01: qty 15

## 2018-12-01 MED ORDER — MIDAZOLAM HCL 5 MG/5ML IJ SOLN
INTRAMUSCULAR | Status: AC
  Filled 2018-12-01: qty 10

## 2018-12-01 MED ORDER — ONDANSETRON HCL 4 MG/2ML IJ SOLN
INTRAMUSCULAR | Status: AC
  Filled 2018-12-01: qty 2

## 2018-12-01 MED ORDER — STERILE WATER FOR IRRIGATION IR SOLN
Status: DC | PRN
  Administered 2018-12-01: 11:00:00

## 2018-12-01 MED ORDER — MIDAZOLAM HCL 5 MG/5ML IJ SOLN
INTRAMUSCULAR | Status: DC | PRN
  Administered 2018-12-01 (×2): 3 mg via INTRAVENOUS

## 2018-12-01 MED ORDER — LIDOCAINE VISCOUS HCL 2 % MT SOLN
OROMUCOSAL | Status: DC | PRN
  Administered 2018-12-01: 4 mL via OROMUCOSAL

## 2018-12-01 MED ORDER — MEPERIDINE HCL 50 MG/ML IJ SOLN
INTRAMUSCULAR | Status: AC
  Filled 2018-12-01: qty 1

## 2018-12-01 MED ORDER — SUCRALFATE 1 G PO TABS: 2.0000 g | ORAL_TABLET | Freq: Every day | ORAL | 1 refills | Status: DC

## 2018-12-01 NOTE — H&P (Addendum)
[name] is an 52 y.o. female.   Chief Complaint: Patient is here for EGD. HPI: Patient is 52 year old Caucasian female who presents with over 63-month history of chest pain which she is having every day.  She was briefly hospitalized about 7 weeks ago.  Troponin levels were negative.  Patient had been taking daily NSAID.  Chest pain was felt to be due to GERD.  She says pantoprazole has taken care for heartburn but not her chest pain.  Chest pain occurs at all different times.  It is not triggered with meals or fasting.  She woke up in the middle of night with chest pain last night.  It is described as pressure.  She experienced nausea and notices mild shortness of breath but no diaphoresis. She denies dysphagia abdominal pain or melena.  Past Medical History:  Diagnosis Date  . Asthma    triggered by weather changes; no inhaler  . Cancer ([place]); Cervical    . Fibromyalgia   . Ganglion cyst of wrist 12/2012   left dorsal cyst  . GERD (gastroesophageal reflux disease)    TUMS as needed  . Headache(784.0)    tension/migraines  . PONV (postoperative nausea and vomiting)    also states stopped breathing after hernia surgery  . Rectal bleeding   . Rheumatoid arthritis ([place])   . Stenosing tenosynovitis of finger 12/2012   left middle finger  .      Past Surgical History:  Procedure Laterality Date  . ABDOMINAL HYSTERECTOMY  1994  . APPENDECTOMY  1996  . COLONOSCOPY N/A 05/08/2013   Procedure: COLONOSCOPY;  Surgeon: Rogene Houston, MD;  [facility];  Service: Endoscopy;  Laterality: N/A;  240  . COLONOSCOPY N/A 09/23/2015   Procedure: COLONOSCOPY;  Surgeon: Rogene Houston, MD;  [facility];  Service: Endoscopy;  Laterality: N/A;  825  . CYSTOSCOPY WITH HYDRODISTENSION AND BIOPSY  04/01/2007  . [place]   left tube and ovary removed  . GANGLION CYST EXCISION  12/17/2012   Procedure: REMOVAL GANGLION OF WRIST;  Surgeon: Wynonia Sours, MD;   Location: [place];  Service: Orthopedics;  Laterality: Left;  . HARDWARE REMOVAL Right 08/09/2014   Procedure: Placement Right Ankle Tight Rope, Removal Plate and Screws;  Surgeon: Marybelle Killings, MD;  Location: [place];  Service: Orthopedics;  Laterality: Right;  . PUBOVAGINAL SLING  2010  . rt ankle surgery    . SALPINGOOPHORECTOMY  1996   right  . TRIGGER FINGER RELEASE  12/17/2012   Procedure: RELEASE TRIGGER FINGER/A-1 PULLEY;  Surgeon: Wynonia Sours, MD;  Location: [place];  Service: Orthopedics;  Laterality: Left;  . TUBAL LIGATION  1992  . VAGINA RECONSTRUCTION SURGERY     x 5 since 2010  . VENTRAL HERNIA REPAIR  09/06/2006    Family History  Problem Relation Age of Onset  . Lung cancer Mother   . Cervical cancer Mother   . Heart disease Father   . Leukemia Father   . Cancer Maternal Grandmother        bladder and colon cancer   Social History:  reports that she has never smoked. She has never used smokeless tobacco. She reports that she does not drink alcohol or use drugs.  Allergies:  Allergies  Allergen Reactions  . Imitrex [Sumatriptan]     Causes a migraine  . Percocet [Oxycodone-Acetaminophen] Other (See Comments)    Eyes dilate  .  Cymbalta [Duloxetine Hcl] Rash  . Latex Rash  . Propoxyphene N-Acetaminophen Nausea And Vomiting and Rash  . Tramadol Rash    Medications Prior to Admission  Medication Sig Dispense Refill  . aspirin-acetaminophen-caffeine (EXCEDRIN MIGRAINE) 250-250-65 MG tablet Take 2 tablets by mouth every 6 (six) hours as needed for headache or migraine.     Marland Kitchen augmented betamethasone dipropionate (DIPROLENE-AF) 0.05 % cream Apply 1 application topically at bedtime.   3  . Cholecalciferol (VITAMIN D3) 125 MCG (5000 UT) CAPS Take 5,000 Units by mouth daily.     Marland Kitchen estradiol (ESTRACE) 2 MG tablet Take 1 tablet (2 mg total) by mouth daily. 90 tablet 4  . gabapentin (NEURONTIN) 400 MG capsule Take 1 capsule (400 mg total)  by mouth at bedtime. 90 capsule 0  . ibuprofen (ADVIL,MOTRIN) 400 MG tablet Take 600 mg by mouth every 6 (six) hours as needed for moderate pain.     . pantoprazole (PROTONIX) 40 MG tablet Take 1 tablet (40 mg total) by mouth 2 (two) times daily before a meal. 90 tablet 3  . Pseudoephedrine-APAP-DM (DAYQUIL PO) Take 2 capsules by mouth 2 (two) times daily as needed (cold symptoms).      No results found for this or any previous visit (from the past 48 hour(s)). No results found.  ROS  Blood pressure 135/81, pulse 61, temperature 98.4 F (36.9 C), temperature source Oral, resp. rate 17, height 5\' 3"  (1.6 m), weight 80.6 kg, SpO2 100 %. Physical Exam  Constitutional: She appears well-developed and well-nourished.  HENT:  Mouth/Throat: Oropharynx is clear and moist.  Eyes: Conjunctivae are normal. No scleral icterus.  Neck: No thyromegaly present.  Cardiovascular: Normal rate, regular rhythm and normal heart sounds.  No murmur heard. Respiratory: Effort normal and breath sounds normal.  GI: Soft. She exhibits no distension and no mass. There is abdominal tenderness.  Mild midepigastric tenderness.  Musculoskeletal:        General: No edema.  Lymphadenopathy:    She has no cervical adenopathy.  Neurological: She is alert.  Skin: Skin is warm and dry.     Assessment/Plan Atypical chest pain felt to be due to GI source. Diagnostic EGD.  Hildred Laser, MD 12/01/2018, 10:41 AM

## 2018-12-01 NOTE — Discharge Instructions (Signed)
No aspirin or NSAIDs for 24 hours. Resume other medications as before. Keep ibuprofen dose no more than 1200 mg/day. Resume usual diet. No driving for 24 hours. Physician will call with biopsy results.       Upper Endoscopy, Adult, Care After This sheet gives you information about how to care for yourself after your procedure. Your health care provider may also give you more specific instructions. If you have problems or questions, contact your health care provider. What can I expect after the procedure? After the procedure, it is common to have:  A sore throat.  Mild stomach pain or discomfort.  Bloating.  Nausea. Follow these instructions at home:   Follow instructions from your health care provider about what to eat or drink after your procedure.  Return to your normal activities as told by your health care provider. Ask your health care provider what activities are safe for you.  Take over-the-counter and prescription medicines only as told by your health care provider.  Do not drive for 24 hours if you were given a sedative during your procedure.  Keep all follow-up visits as told by your health care provider. This is important. Contact a health care provider if you have:  A sore throat that lasts longer than one day.  Trouble swallowing. Get help right away if:  You vomit blood or your vomit looks like coffee grounds.  You have: ? A fever. ? Bloody, black, or tarry stools. ? A severe sore throat or you cannot swallow. ? Difficulty breathing. ? Severe pain in your chest or abdomen. Summary  After the procedure, it is common to have a sore throat, mild stomach discomfort, bloating, and nausea.  Do not drive for 24 hours if you were given a sedative during the procedure.  Follow instructions from your health care provider about what to eat or drink after your procedure.  Return to your normal activities as told by your health care provider. This  information is not intended to replace advice given to you by your health care provider. Make sure you discuss any questions you have with your health care provider. Document Released: 05/27/2012 Document Revised: 04/28/2018 Document Reviewed: 04/28/2018 Elsevier Interactive Patient Education  2019 [employer].

## 2018-12-01 NOTE — Op Note (Signed)
[facility] Patient Name: [name] Procedure Date: 12/01/2018 10:19 AM MRN: [id number] Date of Birth: 1966/05/30 Attending MD: Hildred Laser , MD CSN: [id number] Age: 52 Admit Type: Outpatient Procedure:                Upper GI endoscopy Indications:              Gastro-esophageal reflux disease, Unexplained chest                            pain Providers:                Hildred Laser, MD, Charlsie Quest. Theda Sers RN, RN,                            Randa Spike, Technician Referring MD:             Asencion Noble, MD Medicines:                Lidocaine jelly, Meperidine 50 mg IV, Midazolam 6                            mg IV, Ondansetron 4 mg IV Complications:            No immediate complications. Estimated Blood Loss:     Estimated blood loss was minimal. Procedure:                Pre-Anesthesia Assessment:                           - Prior to the procedure, a History and Physical                            was performed, and patient medications and                            allergies were reviewed. The patient's tolerance of                            previous anesthesia was also reviewed. The risks                            and benefits of the procedure and the sedation                            options and risks were discussed with the patient.                            All questions were answered, and informed consent                            was obtained. Prior Anticoagulants: The patient                            last took aspirin 2 days prior to the procedure.  ASA Grade Assessment: II - A patient with mild                            systemic disease. After reviewing the risks and                            benefits, the patient was deemed in satisfactory                            condition to undergo the procedure.                           After obtaining informed consent, the endoscope was                            passed under direct  vision. Throughout the                            procedure, the patient's blood pressure, pulse, and                            oxygen saturations were monitored continuously. The                            [id number]-[id number] ([id number]) scope was introduced through the                            mouth, and advanced to the second part of duodenum.                            The upper GI endoscopy was accomplished without                            difficulty. The patient tolerated the procedure                            well. Scope In: 10:57:42 AM Scope Out: 11:03:29 AM Total Procedure Duration: 0 hours 5 minutes 47 seconds  Findings:      The examined esophagus was normal.      The Z-line was irregular and was found 35 cm from the incisors.      Multiple dispersed erosions were found in the gastric antrum and in the       prepyloric region of the stomach. Biopsies were taken with a cold       forceps for histology.      The exam of the stomach was otherwise normal.      The duodenal bulb and second portion of the duodenum were normal. Impression:               - Normal esophagus.                           - Z-line irregular, 35 cm from the incisors.                           -  Erosive gastropathy. Biopsied.                           - Normal duodenal bulb and second portion of the                            duodenum.                           Comment: no abnormality noted to explain patient's                            chest pain. Moderate Sedation:      Moderate (conscious) sedation was administered by the endoscopy nurse       and supervised by the endoscopist. The following parameters were       monitored: oxygen saturation, heart rate, blood pressure, CO2       capnography and response to care. Total physician intraservice time was       12 minutes. Recommendation:           - Patient has a contact number available for                            emergencies. The signs and symptoms of  potential                            delayed complications were discussed with the                            patient. Return to normal activities tomorrow.                            Written discharge instructions were provided to the                            patient.                           - Resume previous diet today.                           - No aspirin, ibuprofen, naproxen, or other                            non-steroidal anti-inflammatory drugs for 1 day.                           - Continue present medications.                           - No aspirin, ibuprofen, naproxen, or other                            non-steroidal anti-inflammatory drugs for 1 day.                           - Await  pathology results.                           - Use sucralfate 2 grams PO qhs.                           - Cardiology consultation to cardiac disease. Procedure Code(s):        --- Professional ---                           [phone], Esophagogastroduodenoscopy, flexible,                            transoral; with biopsy, single or multiple                           G0500, Moderate sedation services provided by the                            same physician or other qualified health care                            professional performing a gastrointestinal                            endoscopic service that sedation supports,                            requiring the presence of an independent trained                            observer to assist in the monitoring of the                            patient's level of consciousness and physiological                            status; initial 15 minutes of intra-service time;                            patient age 34 years or older (additional time may                            be reported with [phone], as appropriate) Diagnosis Code(s):        --- Professional ---                           K22.8, Other specified diseases of esophagus                            K31.89, Other diseases of stomach and duodenum                           K21.9, Gastro-esophageal reflux disease without  esophagitis                           R07.9, Chest pain, unspecified CPT copyright 2018 American Medical Association. All rights reserved. The codes documented in this report are preliminary and upon coder review may  be revised to meet current compliance requirements. Hildred Laser, MD Hildred Laser, MD 12/01/2018 11:19:01 AM This report has been signed electronically. Number of Addenda: 0

## 2018-12-08 ENCOUNTER — Encounter (HOSPITAL_COMMUNITY): Payer: Self-pay | Admitting: Internal Medicine

## 2018-12-09 DIAGNOSIS — J111 Influenza due to unidentified influenza virus with other respiratory manifestations: Secondary | ICD-10-CM | POA: Diagnosis not present

## 2018-12-09 DIAGNOSIS — J329 Chronic sinusitis, unspecified: Secondary | ICD-10-CM | POA: Diagnosis not present

## 2018-12-23 ENCOUNTER — Other Ambulatory Visit (HOSPITAL_COMMUNITY): Payer: Self-pay | Admitting: Internal Medicine

## 2018-12-25 ENCOUNTER — Ambulatory Visit (INDEPENDENT_AMBULATORY_CARE_PROVIDER_SITE_OTHER): Payer: BLUE CROSS/BLUE SHIELD | Admitting: Internal Medicine

## 2018-12-25 ENCOUNTER — Encounter (INDEPENDENT_AMBULATORY_CARE_PROVIDER_SITE_OTHER): Payer: Self-pay | Admitting: Internal Medicine

## 2018-12-25 ENCOUNTER — Other Ambulatory Visit (HOSPITAL_COMMUNITY): Payer: Self-pay | Admitting: Obstetrics & Gynecology

## 2018-12-25 VITALS — BP 133/81 | HR 91 | Temp 98.2°F | Ht 63.0 in | Wt 178.8 lb

## 2018-12-25 DIAGNOSIS — R0789 Other chest pain: Secondary | ICD-10-CM | POA: Diagnosis not present

## 2018-12-25 DIAGNOSIS — Z1231 Encounter for screening mammogram for malignant neoplasm of breast: Secondary | ICD-10-CM

## 2018-12-25 DIAGNOSIS — K219 Gastro-esophageal reflux disease without esophagitis: Secondary | ICD-10-CM

## 2018-12-25 NOTE — Progress Notes (Signed)
Subjective:    Patient ID: [name], female    DOB: 22-Sep-1966, 53 y.o.   MRN: [id number]  HPI Here today for f/u. Underwent and EGD for GERD, unexplained chest pain 12/01/2018 by Dr. Laural Golden. Impression:               - Normal esophagus.                           - Z-line irregular, 35 cm from the incisors.                           - Erosive gastropathy. Biopsied.                           - Normal duodenal bulb and second portion of the                            duodenum.                           Comment: no abnormality noted to explain patient's                            chest pain. No. h pylori.  She tells me she is doing good. She continues to have chest pain. Her appetite is okay. No weight loss.  BMs are normal.  She says today she is constipated. GERD controlled with Protonix. Chest pain is random. Not related to food intake.    Review of Systems Past Medical History:  Diagnosis Date  . Asthma    triggered by weather changes; no inhaler  . Cancer ([place])   . Fibromyalgia   . Ganglion cyst of wrist 12/2012   left dorsal cyst  . GERD (gastroesophageal reflux disease)    TUMS as needed  . Headache(784.0)    tension/migraines  . PONV (postoperative nausea and vomiting)    also states stopped breathing after hernia surgery  . Rectal bleeding   . Rheumatoid arthritis ([place])   . Stenosing tenosynovitis of finger 12/2012   left middle finger  . 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 ABDOMINAL HYSTERECTOMY  1994  . APPENDECTOMY  1996  . BIOPSY  12/01/2018   Procedure: BIOPSY;  Surgeon: Rogene Houston, MD;  [facility];  Service: Endoscopy;;  gastric   . COLONOSCOPY N/A 05/08/2013   Procedure: COLONOSCOPY;  Surgeon: Rogene Houston, MD;  [facility];  Service: Endoscopy;  Laterality: N/A;  240  . COLONOSCOPY N/A 09/23/2015   Procedure: COLONOSCOPY;  Surgeon: Rogene Houston, MD;   [facility];  Service: Endoscopy;  Laterality: N/A;  825  . CYSTOSCOPY WITH HYDRODISTENSION AND BIOPSY  04/01/2007  . [place]   left tube and ovary removed  . ESOPHAGOGASTRODUODENOSCOPY N/A 12/01/2018   Procedure: ESOPHAGOGASTRODUODENOSCOPY (EGD);  Surgeon: Rogene Houston, MD;  [facility];  Service: Endoscopy;  Laterality: N/A;  12:45  . GANGLION CYST EXCISION  12/17/2012   Procedure: REMOVAL GANGLION OF WRIST;  Surgeon: Wynonia Sours, MD;  Location: [place];  Service: Orthopedics;  Laterality: Left;  . HARDWARE  REMOVAL Right 08/09/2014   Procedure: Placement Right Ankle Tight Rope, Removal Plate and Screws;  Surgeon: Marybelle Killings, MD;  Location: [place];  Service: Orthopedics;  Laterality: Right;  . PUBOVAGINAL SLING  2010  . rt ankle surgery    . SALPINGOOPHORECTOMY  1996   right  . TRIGGER FINGER RELEASE  12/17/2012   Procedure: RELEASE TRIGGER FINGER/A-1 PULLEY;  Surgeon: Wynonia Sours, MD;  Location: [place];  Service: Orthopedics;  Laterality: Left;  . TUBAL LIGATION  1992  . VAGINA RECONSTRUCTION SURGERY     x 5 since 2010  . VENTRAL HERNIA REPAIR  09/06/2006    Allergies  Allergen Reactions  . Imitrex [Sumatriptan]     Causes a migraine  . Percocet [Oxycodone-Acetaminophen] Other (See Comments)    Eyes dilate  . Cymbalta [Duloxetine Hcl] Rash  . Latex Rash  . Propoxyphene N-Acetaminophen Nausea And Vomiting and Rash  . Tramadol Rash    Current Outpatient Medications on File Prior to Visit  Medication Sig Dispense Refill  . aspirin-acetaminophen-caffeine (EXCEDRIN MIGRAINE) 250-250-65 MG tablet Take 2 tablets by mouth every 6 (six) hours as needed for headache or migraine.     Marland Kitchen augmented betamethasone dipropionate (DIPROLENE-AF) 0.05 % cream Apply 1 application topically at bedtime.   3  . Cholecalciferol (VITAMIN D3) 125 MCG (5000 UT) CAPS Take 5,000 Units by mouth daily.     Marland Kitchen estradiol  (ESTRACE) 2 MG tablet Take 1 tablet (2 mg total) by mouth daily. 90 tablet 4  . gabapentin (NEURONTIN) 400 MG capsule Take 1 capsule (400 mg total) by mouth at bedtime. 90 capsule 0  . ibuprofen (ADVIL,MOTRIN) 400 MG tablet Take 1 tablet (400 mg total) by mouth every 8 (eight) hours as needed for moderate pain. 30 tablet 0  . pantoprazole (PROTONIX) 40 MG tablet Take 1 tablet (40 mg total) by mouth 2 (two) times daily before a meal. 90 tablet 3  . Pseudoephedrine-APAP-DM (DAYQUIL PO) Take 2 capsules by mouth 2 (two) times daily as needed (cold symptoms).    . sucralfate (CARAFATE) 1 g tablet TAKE 2 TABLETS (2 G TOTAL) BY MOUTH AT BEDTIME. 60 tablet 1   No current facility-administered medications on file prior to visit.         Objective:   Physical Exam Blood pressure 133/81, pulse 91, temperature 98.2 F (36.8 C), height 5\' 3"  (1.6 m), weight 178 lb 12.8 oz (81.1 kg). Alert and oriented. Skin warm and dry. Oral mucosa is moist.   . Sclera anicteric, conjunctivae is pink. Thyroid not enlarged. No cervical lymphadenopathy. Lungs clear. Heart regular rate and rhythm.  Abdomen is soft. Bowel sounds are positive. No hepatomegaly. No abdominal masses felt. No tenderness.  No edema to lower extremities.         Assessment & Plan:  Chest pain: Referral to cardiology. GERD: Continue the Protonix and Carafate.  OV in 1 year.

## 2018-12-25 NOTE — Patient Instructions (Signed)
CMET. OV in 1 year. Referral to Cardiology.

## 2019-01-01 ENCOUNTER — Encounter (INDEPENDENT_AMBULATORY_CARE_PROVIDER_SITE_OTHER): Payer: Self-pay

## 2019-01-16 ENCOUNTER — Other Ambulatory Visit (HOSPITAL_COMMUNITY): Payer: Self-pay | Admitting: Internal Medicine

## 2019-01-16 ENCOUNTER — Encounter: Payer: Self-pay | Admitting: Cardiovascular Disease

## 2019-01-16 ENCOUNTER — Ambulatory Visit: Payer: BLUE CROSS/BLUE SHIELD | Admitting: Cardiovascular Disease

## 2019-01-16 VITALS — BP 128/88 | HR 81 | Ht 63.0 in | Wt 178.0 lb

## 2019-01-16 DIAGNOSIS — R079 Chest pain, unspecified: Secondary | ICD-10-CM

## 2019-01-16 NOTE — Progress Notes (Addendum)
CARDIOLOGY CONSULT NOTE  Patient ID: MADISSEN WYSE MRN: [id number] DOB/AGE: 07/11/1966 53 y.o.  Admit date: (Not on file) Primary Physician: Asencion Noble, MD Referring Physician: Butch Penny, NP  Reason for Consultation: Chest pain  HPI: [name] is a 53 y.o. female who is being seen today for the evaluation of chest pain at the request of Vaughan Basta, Rona Ravens, NP.   She was evaluated by GI and had been complaining of a two-month history of chest pain occurring on a daily basis.  She was hospitalized for this and troponins were normal in November 2019.  It was improved with initial nitroglycerin.  It was thought that symptoms were due to GERD.  She takes Protonix.  Chest pain is not triggered with meals or fasting.  She has been awoken at night with the symptoms.  CT angiography of the chest was normal on 10/16/2018.  She underwent an EGD on 12/01/2018 and this was normal.  I personally reviewed ECG performed on 10/17/2018 which showed normal sinus rhythm with no ischemic ST segment or T wave abnormalities, nor any arrhythmias.  She has been experiencing chest pain since October 2019.  She began working [employer] about 4 months ago.  She denies any increased stress in her life.  Chest pain occur both at rest and with exertion.  Is accompanied by shortness of breath and occasional diaphoresis.  She has lightheadedness and dizziness but denies syncope.  She denies palpitations.  She has had nausea but denies vomiting.  The chest pressure is primarily experienced on the left side of her chest.  Last night the episode of chest pain lasted for about an hour.  She had been working and laughing and joking with colleagues.  She then wrapped her arms around her chest to see if it would provide relief but it did not.  She does breast exams and has not noticed any breast tenderness.  She denies a family history of breast cancer.  She is a non-smoker.  She tried exercising on a  treadmill with her son at a gym for about an hour and felt lightheaded and dizzy afterwards.  Her fibromyalgia primarily affects her legs.  She has noticed some tingling and numbness in the left foot and has had some right ankle swelling.  She previously underwent surgery on her right foot after an injury.  She has occasional dysphagia for pills but seldom has it for solid foods.  Family history: Mother had CABG in her 75s and died of cancer at the age of 62.  Father had CABG in his 78s.  Both parents were chain smokers as per the patient.   Allergies  Allergen Reactions  . Imitrex [Sumatriptan]     Causes a migraine  . Cymbalta [Duloxetine Hcl] Rash  . Latex Rash  . Propoxyphene N-Acetaminophen Nausea And Vomiting and Rash  . Tramadol Rash    Current Outpatient Medications  Medication Sig Dispense Refill  . aspirin-acetaminophen-caffeine (EXCEDRIN MIGRAINE) 250-250-65 MG tablet Take 2 tablets by mouth every 6 (six) hours as needed for headache or migraine.     Marland Kitchen augmented betamethasone dipropionate (DIPROLENE-AF) 0.05 % cream Apply 1 application topically at bedtime.   3  . Cholecalciferol (VITAMIN D3) 125 MCG (5000 UT) CAPS Take 5,000 Units by mouth daily.     Marland Kitchen estradiol (ESTRACE) 2 MG tablet Take 1 tablet (2 mg total) by mouth daily. 90 tablet 4  . gabapentin (NEURONTIN) 400 MG capsule  Take 1 capsule (400 mg total) by mouth at bedtime. 90 capsule 0  . ibuprofen (ADVIL,MOTRIN) 400 MG tablet Take 1 tablet (400 mg total) by mouth every 8 (eight) hours as needed for moderate pain. 30 tablet 0  . pantoprazole (PROTONIX) 40 MG tablet Take 1 tablet (40 mg total) by mouth 2 (two) times daily before a meal. 90 tablet 3  . sucralfate (CARAFATE) 1 g tablet TAKE 2 TABLETS (2 G TOTAL) BY MOUTH AT BEDTIME. 60 tablet 1   No current facility-administered medications for this visit.     Past Medical History:  Diagnosis Date  . Asthma    triggered by weather changes; no inhaler  . Cancer ([place])    . Fibromyalgia   . Ganglion cyst of wrist 12/2012   left dorsal cyst  . GERD (gastroesophageal reflux disease)    TUMS as needed  . Headache(784.0)    tension/migraines  . PONV (postoperative nausea and vomiting)    also states stopped breathing after hernia surgery  . Rectal bleeding   . Rheumatoid arthritis ([place])   . Stenosing tenosynovitis of finger 12/2012   left middle finger  . 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 ABDOMINAL HYSTERECTOMY  1994  . APPENDECTOMY  1996  . BIOPSY  12/01/2018   Procedure: BIOPSY;  Surgeon: Rogene Houston, MD;  [facility];  Service: Endoscopy;;  gastric   . COLONOSCOPY N/A 05/08/2013   Procedure: COLONOSCOPY;  Surgeon: Rogene Houston, MD;  [facility];  Service: Endoscopy;  Laterality: N/A;  240  . COLONOSCOPY N/A 09/23/2015   Procedure: COLONOSCOPY;  Surgeon: Rogene Houston, MD;  [facility];  Service: Endoscopy;  Laterality: N/A;  825  . CYSTOSCOPY WITH HYDRODISTENSION AND BIOPSY  04/01/2007  . [place]   left tube and ovary removed  . ESOPHAGOGASTRODUODENOSCOPY N/A 12/01/2018   Procedure: ESOPHAGOGASTRODUODENOSCOPY (EGD);  Surgeon: Rogene Houston, MD;  [facility];  Service: Endoscopy;  Laterality: N/A;  12:45  . GANGLION CYST EXCISION  12/17/2012   Procedure: REMOVAL GANGLION OF WRIST;  Surgeon: Wynonia Sours, MD;  Location: [place];  Service: Orthopedics;  Laterality: Left;  . HARDWARE REMOVAL Right 08/09/2014   Procedure: Placement Right Ankle Tight Rope, Removal Plate and Screws;  Surgeon: Marybelle Killings, MD;  Location: [place];  Service: Orthopedics;  Laterality: Right;  . PUBOVAGINAL SLING  2010  . rt ankle surgery    . SALPINGOOPHORECTOMY  1996   right  . TRIGGER FINGER RELEASE  12/17/2012   Procedure: RELEASE TRIGGER FINGER/A-1 PULLEY;  Surgeon: Wynonia Sours, MD;  Location: [place];  Service: Orthopedics;  Laterality: Left;  . TUBAL LIGATION  1992  . VAGINA RECONSTRUCTION SURGERY     x 5 since 2010  . VENTRAL HERNIA REPAIR  09/06/2006    Social History   Socioeconomic History  . Marital status: Married    Spouse name: Not on file  . Number of children: Not on file  . Years of education: Not on file  . Highest education level: Not on file  Occupational History  . Not on file  Social Needs  . Financial resource strain: Not on file  . Food insecurity:    Worry: Not on file    Inability: Not on file  . Transportation needs:    Medical: Not  on file    Non-medical: Not on file  Tobacco Use  . Smoking status: Never Smoker  . Smokeless tobacco: Never Used  Substance and Sexual Activity  . Alcohol use: No  . Drug use: No  . Sexual activity: Yes    Birth control/protection: Surgical    Comment: hyst  Lifestyle  . Physical activity:    Days per week: Not on file    Minutes per session: Not on file  . Stress: Not on file  Relationships  . Social connections:    Talks on phone: Not on file    Gets together: Not on file    Attends religious service: Not on file    Active member of club or organization: Not on file    Attends meetings of clubs or organizations: Not on file    Relationship status: Not on file  . Intimate partner violence:    Fear of current or ex partner: Not on file    Emotionally abused: Not on file    Physically abused: Not on file    Forced sexual activity: Not on file  Other Topics Concern  . Not on file  Social History Narrative  . Not on file      Current Meds  Medication Sig  . aspirin-acetaminophen-caffeine (EXCEDRIN MIGRAINE) 250-250-65 MG tablet Take 2 tablets by mouth every 6 (six) hours as needed for headache or migraine.   Marland Kitchen augmented betamethasone dipropionate (DIPROLENE-AF) 0.05 % cream Apply 1 application topically at bedtime.   . Cholecalciferol (VITAMIN D3) 125 MCG (5000 UT) CAPS Take 5,000 Units  by mouth daily.   Marland Kitchen estradiol (ESTRACE) 2 MG tablet Take 1 tablet (2 mg total) by mouth daily.  Marland Kitchen gabapentin (NEURONTIN) 400 MG capsule Take 1 capsule (400 mg total) by mouth at bedtime.  Marland Kitchen ibuprofen (ADVIL,MOTRIN) 400 MG tablet Take 1 tablet (400 mg total) by mouth every 8 (eight) hours as needed for moderate pain.  . pantoprazole (PROTONIX) 40 MG tablet Take 1 tablet (40 mg total) by mouth 2 (two) times daily before a meal.  . sucralfate (CARAFATE) 1 g tablet TAKE 2 TABLETS (2 G TOTAL) BY MOUTH AT BEDTIME.    A nurse/CMA was present throughout the physical exam.  Review of systems complete and found to be negative unless listed above in HPI     Physical exam Blood pressure 128/88, pulse 81, height 5\' 3"  (1.6 m), weight 178 lb (80.7 kg), SpO2 97 %. General: NAD Neck: No JVD, no thyromegaly or thyroid nodule.  Lungs: Clear to auscultation bilaterally with normal respiratory effort. CV: Nondisplaced PMI. Regular rate and rhythm, normal S1/S2, no S3/S4, no murmur.  No peripheral edema.  No carotid bruit.    Abdomen: Soft, nontender, no distention.  Skin: Intact without lesions or rashes.  Neurologic: Alert and oriented x 3.  Psych: Normal affect. Extremities: No clubbing or cyanosis.  HEENT: Normal.   ECG: Most recent ECG reviewed.   Labs: Lab Results  Component Value Date/Time   K 3.8 10/16/2018 06:50 AM   BUN 11 10/16/2018 06:50 AM   CREATININE 0.72 10/16/2018 06:50 AM   ALT 13 10/16/2018 06:50 AM   TSH 1.930 01/12/2016 10:16 AM   HGB 12.9 10/16/2018 06:50 AM     Lipids: No results found for: LDLCALC, LDLDIRECT, CHOL, TRIG, HDL      ASSESSMENT AND PLAN:  1.  Chest pain: She has had recurrent symptoms occurring on a near daily basis and sometimes awakening her at night.  There is a mixed picture as symptoms can occur both at rest and with exertion.  She lacks traditional risk factors other than a strong family history of heart disease, although both parents were heavy  smokers.   EGD was normal.  CT angiography of the chest November 2018 did not mention any evidence of coronary artery calcifications.  She may be having vasospastic disease.   I will proceed with an exercise Myoview to evaluate for ischemic heart disease. I will order a 2-D echocardiogram with Doppler to evaluate cardiac structure, function, and regional wall motion.    Disposition: Follow up in 3 months  Signed: Kate Sable, M.D., F.A.C.C.  01/16/2019, 9:07 AM

## 2019-01-16 NOTE — Patient Instructions (Signed)
Medication Instructions:  Your physician recommends that you continue on your current medications as directed. Please refer to the Current Medication list given to you today.  If you need a refill on your cardiac medications before your next appointment, please call your pharmacy.   Lab work: None today If you have labs (blood work) drawn today and your tests are completely normal, you will receive your results only by: Marland Kitchen MyChart Message (if you have MyChart) OR . A paper copy in the mail If you have any lab test that is abnormal or we need to change your treatment, we will call you to review the results.  Testing/Procedures: Your physician has requested that you have an echocardiogram. Echocardiography is a painless test that uses sound waves to create images of your heart. It provides your doctor with information about the size and shape of your heart and how well your heart's chambers and valves are working. This procedure takes approximately one hour. There are no restrictions for this procedure.  Your physician has requested that you have en exercise stress myoview. For further information please visit [email or web address]. Please follow instruction sheet, as given.   Follow-Up: At [facility], you and your health needs are our priority.  As part of our continuing mission to provide you with exceptional heart care, we have created designated Provider Care Teams.  These Care Teams include your primary Cardiologist (physician) and Advanced Practice Providers (APPs -  Physician Assistants and Nurse Practitioners) who all work together to provide you with the care you need, when you need it. You will need a follow up appointment in 3 months.  Please call our office 2 months in advance to schedule this appointment.  You may see Kate Sable, MD or one of the following Advanced Practice Providers on your designated Care Team:   Bernerd Pho, PA-C [facility]) . Ermalinda Barrios,  PA-C ([place])  Any Other Special Instructions Will Be Listed Below (If Applicable). None

## 2019-01-27 ENCOUNTER — Encounter (HOSPITAL_BASED_OUTPATIENT_CLINIC_OR_DEPARTMENT_OTHER)
Admission: RE | Admit: 2019-01-27 | Discharge: 2019-01-27 | Disposition: A | Discharge status: Home / Self Care | Payer: BLUE CROSS/BLUE SHIELD | Source: Ambulatory Visit | Attending: Cardiovascular Disease | Admitting: Cardiovascular Disease

## 2019-01-27 ENCOUNTER — Encounter (HOSPITAL_COMMUNITY): Payer: Self-pay

## 2019-01-27 ENCOUNTER — Ambulatory Visit (HOSPITAL_COMMUNITY)
Admission: RE | Admit: 2019-01-27 | Discharge: 2019-01-27 | Disposition: A | Discharge status: Home / Self Care | Payer: BLUE CROSS/BLUE SHIELD | Source: Ambulatory Visit | Attending: Cardiovascular Disease | Admitting: Cardiovascular Disease

## 2019-01-27 ENCOUNTER — Encounter (HOSPITAL_COMMUNITY)
Admission: RE | Admit: 2019-01-27 | Discharge: 2019-01-27 | Disposition: A | Discharge status: Home / Self Care | Payer: BLUE CROSS/BLUE SHIELD | Source: Ambulatory Visit | Attending: Cardiovascular Disease | Admitting: Cardiovascular Disease

## 2019-01-27 DIAGNOSIS — R079 Chest pain, unspecified: Secondary | ICD-10-CM | POA: Diagnosis not present

## 2019-01-27 HISTORY — DX: Systemic involvement of connective tissue, unspecified: M35.9

## 2019-01-27 LAB — NM MYOCAR MULTI W/SPECT W/WALL MOTION / EF
Estimated workload: 10.1 METS
Exercise duration (min): 8 min
Exercise duration (sec): 27 s
LV dias vol: 66 mL (ref 46–106)
LV sys vol: 21 mL
MPHR: 170 {beats}/min
Peak HR: 155 {beats}/min
Percent HR: 91 %
RATE: 0.43
RPE: 16
Rest HR: 64 {beats}/min
SDS: 1
SRS: 0
SSS: 1
TID: 0.97

## 2019-01-27 MED ORDER — TECHNETIUM TC 99M TETROFOSMIN IV KIT
30.0000 | PACK | Freq: Once | INTRAVENOUS | Status: AC | PRN
  Administered 2019-01-27: 31 via INTRAVENOUS

## 2019-01-27 MED ORDER — REGADENOSON 0.4 MG/5ML IV SOLN
INTRAVENOUS | Status: AC
  Filled 2019-01-27: qty 5

## 2019-01-27 MED ORDER — TECHNETIUM TC 99M TETROFOSMIN IV KIT
10.0000 | PACK | Freq: Once | INTRAVENOUS | Status: AC | PRN
  Administered 2019-01-27: 10.6 via INTRAVENOUS

## 2019-01-27 MED ORDER — SODIUM CHLORIDE 0.9% FLUSH
INTRAVENOUS | Status: AC
  Administered 2019-01-27: 10 mL via INTRAVENOUS
  Filled 2019-01-27: qty 10

## 2019-01-27 NOTE — Progress Notes (Signed)
*  PRELIMINARY RESULTS* Echocardiogram 2D Echocardiogram has been performed.  Leavy Cella 01/27/2019, 9:27 AM

## 2019-01-30 ENCOUNTER — Telehealth: Payer: Self-pay

## 2019-01-30 NOTE — Telephone Encounter (Signed)
Pt made aware

## 2019-01-30 NOTE — Telephone Encounter (Signed)
-----   Message from Arnoldo Lenis, MD sent at 01/29/2019  6:27 PM EST ----- Normal echo, covering for Dr Glendora Score MD

## 2019-01-30 NOTE — Telephone Encounter (Signed)
Called pt., no answer. Left message for pt to return call.  

## 2019-01-30 NOTE — Telephone Encounter (Signed)
-----   Message from Arnoldo Lenis, MD sent at 01/29/2019  6:28 PM EST ----- Normal stress test, no evidence of any blockages. Keep regular f/u with Dr Glendora Score MD

## 2019-01-30 NOTE — Telephone Encounter (Signed)
Called pt. No answer, left message for pt to return call.  

## 2019-02-06 ENCOUNTER — Ambulatory Visit (HOSPITAL_COMMUNITY): Payer: BLUE CROSS/BLUE SHIELD

## 2019-02-06 ENCOUNTER — Other Ambulatory Visit (INDEPENDENT_AMBULATORY_CARE_PROVIDER_SITE_OTHER): Payer: Self-pay

## 2019-02-06 ENCOUNTER — Ambulatory Visit (HOSPITAL_COMMUNITY)
Admission: RE | Admit: 2019-02-06 | Discharge: 2019-02-06 | Disposition: A | Discharge status: Home / Self Care | Payer: BLUE CROSS/BLUE SHIELD | Source: Ambulatory Visit | Attending: Obstetrics & Gynecology | Admitting: Obstetrics & Gynecology

## 2019-02-06 DIAGNOSIS — Z1231 Encounter for screening mammogram for malignant neoplasm of breast: Secondary | ICD-10-CM | POA: Diagnosis not present

## 2019-02-13 ENCOUNTER — Other Ambulatory Visit (HOSPITAL_COMMUNITY): Payer: Self-pay | Admitting: Internal Medicine

## 2019-03-12 DIAGNOSIS — J019 Acute sinusitis, unspecified: Secondary | ICD-10-CM | POA: Diagnosis not present

## 2019-03-18 ENCOUNTER — Other Ambulatory Visit: Payer: Self-pay | Admitting: Obstetrics & Gynecology

## 2019-03-24 NOTE — Telephone Encounter (Signed)
Pt checking status on her medication being refilled.

## 2019-05-14 ENCOUNTER — Telehealth: Payer: Self-pay | Admitting: Cardiovascular Disease

## 2019-05-14 NOTE — Telephone Encounter (Signed)
Virtual Visit Pre-Appointment Phone Call  "(Name), I am calling you today to discuss your upcoming appointment. We are currently trying to limit exposure to the virus that causes COVID-19 by seeing patients at home rather than in the office."  1. "What is the BEST phone number to call the day of the visit?" - include this in appointment notes  2. Do you have or have access to (through a family member/friend) a smartphone with video capability that we can use for your visit?" a. If yes - list this number in appt notes as cell (if different from BEST phone #) and list the appointment type as a VIDEO visit in appointment notes b. If no - list the appointment type as a PHONE visit in appointment notes  3. Confirm consent - "In the setting of the current Covid19 crisis, you are scheduled for a (phone or video) visit with your provider on (date) at (time).  Just as we do with many in-office visits, in order for you to participate in this visit, we must obtain consent.  If you'd like, I can send this to your mychart (if signed up) or email for you to review.  Otherwise, I can obtain your verbal consent now.  All virtual visits are billed to your insurance company just like a normal visit would be.  By agreeing to a virtual visit, we'd like you to understand that the technology does not allow for your provider to perform an examination, and thus may limit your provider's ability to fully assess your condition. If your provider identifies any concerns that need to be evaluated in person, we will make arrangements to do so.  Finally, though the technology is pretty good, we cannot assure that it will always work on either your or our end, and in the setting of a video visit, we may have to convert it to a phone-only visit.  In either situation, we cannot ensure that we have a secure connection.  Are you willing to proceed?" STAFF: Did the patient verbally acknowledge consent to telehealth visit? Document  YES/NO here: Yes  4. Advise patient to be prepared - "Two hours prior to your appointment, go ahead and check your blood pressure, pulse, oxygen saturation, and your weight (if you have the equipment to check those) and write them all down. When your visit starts, your provider will ask you for this information. If you have an Apple Watch or Kardia device, please plan to have heart rate information ready on the day of your appointment. Please have a pen and paper handy nearby the day of the visit as well."  5. Give patient instructions for MyChart download to smartphone OR [email or web address] as below if video visit (depending on what platform provider is using)  6. Inform patient they will receive a phone call 15 minutes prior to their appointment time (may be from unknown caller ID) so they should be prepared to answer    TELEPHONE CALL NOTE  [name] has been deemed a candidate for a follow-up tele-health visit to limit community exposure during the Covid-19 pandemic. I spoke with the patient via phone to ensure availability of phone/video source, confirm preferred email & phone number, and discuss instructions and expectations.  I reminded [name] to be prepared with any vital sign and/or heart rhythm information that could potentially be obtained via home monitoring, at the time of her visit. I reminded [name] to expect a phone call prior to  her visit.  Bertram Gala Goins 05/14/2019 10:41 AM

## 2019-05-18 ENCOUNTER — Telehealth (INDEPENDENT_AMBULATORY_CARE_PROVIDER_SITE_OTHER): Payer: BC Managed Care – PPO | Admitting: Cardiovascular Disease

## 2019-05-18 ENCOUNTER — Encounter: Payer: Self-pay | Admitting: Cardiovascular Disease

## 2019-05-18 VITALS — BP 176/91 | HR 78 | Ht 63.0 in | Wt 180.0 lb

## 2019-05-18 DIAGNOSIS — R29898 Other symptoms and signs involving the musculoskeletal system: Secondary | ICD-10-CM

## 2019-05-18 DIAGNOSIS — R079 Chest pain, unspecified: Secondary | ICD-10-CM

## 2019-05-18 DIAGNOSIS — I10 Essential (primary) hypertension: Secondary | ICD-10-CM

## 2019-05-18 MED ORDER — AMLODIPINE BESYLATE 5 MG PO TABS: 5.0000 mg | ORAL_TABLET | Freq: Every day | ORAL | 3 refills | Status: DC

## 2019-05-18 NOTE — Patient Instructions (Signed)
Medication Instructions: START Amlodipine 5 mg dailt  Labwork: none  Procedures/Testing: none  Follow-Up: 3 months with Dr.Koneswaran  Any Additional Special Instructions Will Be Listed Below (If Applicable).   Take your blood pressure every other day for 3-4 weeks  If you need a refill on your cardiac medications before your next appointment, please call your pharmacy.     Thank you for choosing [facility] !

## 2019-05-18 NOTE — Progress Notes (Signed)
Virtual Visit via Video Note   This visit type was conducted due to national recommendations for restrictions regarding the COVID-19 Pandemic (e.g. social distancing) in an effort to limit this patient's exposure and mitigate transmission in our community.  Due to her co-morbid illnesses, this patient is at least at moderate risk for complications without adequate follow up.  This format is felt to be most appropriate for this patient at this time.  All issues noted in this document were discussed and addressed.  A limited physical exam was performed with this format.  Please refer to the patient's chart for her consent to telehealth for [facility].   Date:  05/18/2019   ID:  [name], DOB [birth year], MRN [id number]  Patient Location: Home Provider Location: Home  PCP:  Asencion Noble, MD  Cardiologist:  Kate Sable, MD  Electrophysiologist:  None   Evaluation Performed:  Follow-Up Visit  Chief Complaint:  Chest pain  History of Present Illness:    [name] is a 53 y.o. female with a history of chest pain whom I first evaluated on 01/16/2019.  She ultimately underwent a normal nuclear stress test on 01/27/2019.  She also has a h/o fibromyalgia which primarily affects her legs.  She had an episode of chest pain last night which lasted for 30 minutes.  She was lying down in bed when it occurred.  She denies exertional chest pain and dyspnea.  She did not have any associated lightheadedness no dizziness.  She describes the pain as a mixture of sharp and dull in character with no radiation to the back, jaw, or arms.  She also complains of bilateral leg fatigue.  She walks up to 14 miles at work ([employer]).  She does not like take medications.  Her husband takes lisinopril and amlodipine for hypertension.  She denies palpitations, orthopnea, and paroxysmal nocturnal dyspnea.  She also complains of tingling and numbness of the small toe in the left foot.  She also has  occasional swelling of the right ankle and did have surgery in that foot years ago.  The patient does not have symptoms concerning for COVID-19 infection (fever, chills, cough, or new shortness of breath).    Past Medical History:  Diagnosis Date  . Asthma    triggered by weather changes; no inhaler  . Cancer ([place])    Cervical  . Collagen vascular disease ([place])   . Fibromyalgia   . Ganglion cyst of wrist 12/2012   left dorsal cyst  . GERD (gastroesophageal reflux disease)    TUMS as needed  . Headache(784.0)    tension/migraines  . PONV (postoperative nausea and vomiting)    also states stopped breathing after hernia surgery  . Rectal bleeding   . Rheumatoid arthritis ([place])   . Stenosing tenosynovitis of finger 12/2012   left middle finger  . 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 [facility])    Past Surgical History:  Procedure Laterality Date  . ABDOMINAL HYSTERECTOMY  1994  . APPENDECTOMY  1996  . BIOPSY  12/01/2018   Procedure: BIOPSY;  Surgeon: Rogene Houston, MD;  [facility];  Service: Endoscopy;;  gastric   . COLONOSCOPY N/A 05/08/2013   Procedure: COLONOSCOPY;  Surgeon: Rogene Houston, MD;  [facility];  Service: Endoscopy;  Laterality: N/A;  240  . COLONOSCOPY N/A 09/23/2015   Procedure: COLONOSCOPY;  Surgeon: Rogene Houston, MD;  [facility];  Service: Endoscopy;  Laterality: N/A;  [place] AND BIOPSY  04/01/2007  . [place]   left tube and ovary removed  . ESOPHAGOGASTRODUODENOSCOPY N/A 12/01/2018   Procedure: ESOPHAGOGASTRODUODENOSCOPY (EGD);  Surgeon: Rogene Houston, MD;  [facility];  Service: Endoscopy;  Laterality: N/A;  12:45  . GANGLION CYST EXCISION  12/17/2012   Procedure: REMOVAL GANGLION OF WRIST;  Surgeon: Wynonia Sours, MD;  Location: [place];  Service: Orthopedics;  Laterality: Left;  . HARDWARE REMOVAL Right 08/09/2014   Procedure:  Placement Right Ankle Tight Rope, Removal Plate and Screws;  Surgeon: Marybelle Killings, MD;  Location: [place];  Service: Orthopedics;  Laterality: Right;  . PUBOVAGINAL SLING  2010  . rt ankle surgery    . SALPINGOOPHORECTOMY  1996   right  . TRIGGER FINGER RELEASE  12/17/2012   Procedure: RELEASE TRIGGER FINGER/A-1 PULLEY;  Surgeon: Wynonia Sours, MD;  Location: [place];  Service: Orthopedics;  Laterality: Left;  . TUBAL LIGATION  1992  . VAGINA RECONSTRUCTION SURGERY     x 5 since 2010  . VENTRAL HERNIA REPAIR  09/06/2006     Current Meds  Medication Sig  . aspirin-acetaminophen-caffeine (EXCEDRIN MIGRAINE) 250-250-65 MG tablet Take 2 tablets by mouth every 6 (six) hours as needed for headache or migraine.   Marland Kitchen augmented betamethasone dipropionate (DIPROLENE-AF) 0.05 % cream Apply 1 application topically at bedtime.   . Cholecalciferol (VITAMIN D3) 125 MCG (5000 UT) CAPS Take 5,000 Units by mouth daily.   Marland Kitchen estradiol (ESTRACE) 2 MG tablet TAKE 1 TABLET BY MOUTH EVERY DAY  . gabapentin (NEURONTIN) 400 MG capsule Take 1 capsule (400 mg total) by mouth at bedtime.  . [DISCONTINUED] pantoprazole (PROTONIX) 40 MG tablet Take 1 tablet (40 mg total) by mouth 2 (two) times daily before a meal. (Patient taking differently: Take 40 mg by mouth daily. )     Allergies:   Imitrex [sumatriptan]; Cymbalta [duloxetine hcl]; Latex; Propoxyphene n-acetaminophen; and Tramadol   Social History   Tobacco Use  . Smoking status: Never Smoker  . Smokeless tobacco: Never Used  Substance Use Topics  . Alcohol use: No  . Drug use: No     Family Hx: The patient's family history includes Cancer in her maternal grandmother; Cervical cancer in her mother; Heart disease in her father; Leukemia in her father; Lung cancer in her mother.  ROS:   Please see the history of present illness.     All other systems reviewed and are negative.   Prior CV studies:   The following studies were reviewed  today:  NST 01/27/19:   Blood pressure demonstrated a normal response to exercise.  There was no ST segment deviation noted during stress.  The study is normal. There are no perfusion defects  This is a low risk study.  The left ventricular ejection fraction is hyperdynamic (>65%).    Echocardiogram 01/27/19:  1. The left ventricle has normal systolic function with an ejection fraction of 60-65%. The cavity size was normal. There is mildly increased left ventricular wall thickness. Left ventricular diastolic parameters were normal.  2. The right ventricle has normal systolic function. The cavity was normal. There is no increase in right ventricular wall thickness.  3. The mitral valve is normal in structure. No evidence of mitral valve stenosis.  4. The tricuspid valve is normal in structure.  5. The aortic valve has an indeterminant number of cusps no stenosis of the  aortic valve.  6. The aortic root is normal in size and structure.  7. Pulmonary hypertension is Indeterminant, inadequate TR jet.  8. Right atrial pressure is estimated at 3 mmHg.  Labs/Other Tests and Data Reviewed:    EKG:  No ECG reviewed.  Recent Labs: 10/16/2018: ALT 13; BUN 11; Creatinine, Ser 0.72; Hemoglobin 12.9; Platelets 225; Potassium 3.8; Sodium 136   Recent Lipid Panel No results found for: CHOL, TRIG, HDL, CHOLHDL, LDLCALC, LDLDIRECT  Wt Readings from Last 3 Encounters:  05/18/19 180 lb (81.6 kg)  01/16/19 178 lb (80.7 kg)  12/25/18 178 lb 12.8 oz (81.1 kg)     Objective:    Vital Signs:  BP (!) 176/91   Pulse 78   Ht 5\' 3"  (1.6 m)   Wt 180 lb (81.6 kg)   BMI 31.89 kg/m    VITAL SIGNS:  reviewed  ASSESSMENT & PLAN:    1. Chest pain: Nuclear stress test was normal and cardiac function is normal.  I wonder if she is having episodic spikes in blood pressure causing her symptoms.  Given her fibromyalgia I also wonder about the possibility of vasospastic coronary disease.  I have  recommended amlodipine 5 mg daily.  While she is not dizzy asked about starting medication she is willing to try this.  2. HTN: No prior diagnosis of this but BP is markedly elevated. I will start amlodipine 5 mg daily.  I have asked her to check her blood pressure every other day for the next 3 to 4 weeks.  3.  Bilateral leg weakness and fatigue: Possibly related to fibromyalgia.  She does not smoke.  I would consider ABIs in the future.   COVID-19 Education: The signs and symptoms of COVID-19 were discussed with the patient and how to seek care for testing (follow up with PCP or arrange E-visit).  The importance of social distancing was discussed today.  Time:   Today, I have spent 25 minutes with the patient with telehealth technology discussing the above problems.     Medication Adjustments/Labs and Tests Ordered: Current medicines are reviewed at length with the patient today.  Concerns regarding medicines are outlined above.   Tests Ordered: No orders of the defined types were placed in this encounter.   Medication Changes: No orders of the defined types were placed in this encounter.   Disposition:  Follow up in 3 month(s)  Signed, Kate Sable, MD  05/18/2019 9:21 AM    [place]

## 2019-05-18 NOTE — Progress Notes (Signed)
Physical Exam: Gen: NAD HEENT: [place], [place]/at Neck: No JVD Resp: no increased work of breathing Musculoskeletal: No deformities. Normal ROM. CV: No edema Neuro: Non-focal. Psych: Normal affect.

## 2019-06-01 DIAGNOSIS — I10 Essential (primary) hypertension: Secondary | ICD-10-CM | POA: Diagnosis not present

## 2019-06-01 DIAGNOSIS — R079 Chest pain, unspecified: Secondary | ICD-10-CM | POA: Diagnosis not present

## 2019-06-03 ENCOUNTER — Other Ambulatory Visit: Payer: Self-pay | Admitting: Women's Health

## 2019-06-03 ENCOUNTER — Telehealth: Payer: Self-pay | Admitting: Obstetrics & Gynecology

## 2019-06-03 NOTE — Telephone Encounter (Signed)
error 

## 2019-06-04 ENCOUNTER — Encounter (HOSPITAL_COMMUNITY): Payer: Self-pay | Admitting: Emergency Medicine

## 2019-06-04 ENCOUNTER — Emergency Department (HOSPITAL_COMMUNITY)
Admission: EM | Admit: 2019-06-04 | Discharge: 2019-06-04 | Disposition: A | Discharge status: Home / Self Care | Payer: BC Managed Care – PPO | Attending: Emergency Medicine | Admitting: Emergency Medicine

## 2019-06-04 ENCOUNTER — Emergency Department (HOSPITAL_COMMUNITY): Payer: BC Managed Care – PPO

## 2019-06-04 ENCOUNTER — Other Ambulatory Visit: Payer: Self-pay

## 2019-06-04 DIAGNOSIS — R0789 Other chest pain: Secondary | ICD-10-CM | POA: Diagnosis not present

## 2019-06-04 DIAGNOSIS — R079 Chest pain, unspecified: Secondary | ICD-10-CM

## 2019-06-04 DIAGNOSIS — M069 Rheumatoid arthritis, unspecified: Secondary | ICD-10-CM | POA: Insufficient documentation

## 2019-06-04 DIAGNOSIS — J45909 Unspecified asthma, uncomplicated: Secondary | ICD-10-CM | POA: Diagnosis not present

## 2019-06-04 DIAGNOSIS — Z79899 Other long term (current) drug therapy: Secondary | ICD-10-CM | POA: Insufficient documentation

## 2019-06-04 DIAGNOSIS — K219 Gastro-esophageal reflux disease without esophagitis: Secondary | ICD-10-CM | POA: Insufficient documentation

## 2019-06-04 DIAGNOSIS — R55 Syncope and collapse: Secondary | ICD-10-CM | POA: Diagnosis not present

## 2019-06-04 DIAGNOSIS — R110 Nausea: Secondary | ICD-10-CM | POA: Diagnosis not present

## 2019-06-04 LAB — BASIC METABOLIC PANEL
Anion gap: 9 (ref 5–15)
BUN: 12 mg/dL (ref 6–20)
CO2: 25 mmol/L (ref 22–32)
Calcium: 8.4 mg/dL — ABNORMAL LOW (ref 8.9–10.3)
Chloride: 104 mmol/L (ref 98–111)
Creatinine, Ser: 0.63 mg/dL (ref 0.44–1.00)
GFR calc Af Amer: >60 mL/min (ref >60)
GFR calc non Af Amer: >60 mL/min (ref >60)
Glucose, Bld: 71 mg/dL (ref 70–99)
Potassium: 4.1 mmol/L (ref 3.5–5.1)
Sodium: 138 mmol/L (ref 135–145)

## 2019-06-04 LAB — CBC
HCT: 37.1 % (ref 36.0–46.0)
Hemoglobin: 12.3 g/dL (ref 12.0–15.0)
MCH: 32.8 pg (ref 26.0–34.0)
MCHC: 33.2 g/dL (ref 30.0–36.0)
MCV: 98.9 fL (ref 80.0–100.0)
Platelets: 216 10*3/uL (ref 150–400)
RBC: 3.75 MIL/uL — ABNORMAL LOW (ref 3.87–5.11)
RDW: 12.8 % (ref 11.5–15.5)
WBC: 10 10*3/uL (ref 4.0–10.5)
nRBC: 0 % (ref 0.0–0.2)

## 2019-06-04 LAB — TROPONIN I (HIGH SENSITIVITY)
Troponin I (High Sensitivity): <2 ng/L (ref <18)
Troponin I (High Sensitivity): <2 ng/L (ref <18)

## 2019-06-04 MED ORDER — SODIUM CHLORIDE 0.9% FLUSH: 3.0000 mL | Freq: Once | INTRAVENOUS | Status: DC

## 2019-06-04 NOTE — ED Provider Notes (Addendum)
[facility] EMERGENCY DEPARTMENT Provider Note   CSN: [id number] Arrival date & time: 06/04/19  1608    History   Chief Complaint Chief Complaint  Patient presents with  . Chest Pain    HPI [name] is a 53 y.o. female.     Episode of leg cramps at approximately 0200 this morning associated nausea, chest pressure, diaphoresis, chest pressure, and a syncopal spell.  Chest pain has come and gone, but persisted throughout the day.  No known history of cardiac disease.  Cardiac risk factors minimal.  No prolonged travel or immobilization.  Severity is mild to moderate.  Nothing makes symptoms better or worse.  Patient just started her first dose of Lexapro last night.     Past Medical History:  Diagnosis Date  . Asthma    triggered by weather changes; no inhaler  . Cancer ([place])    Cervical  . Collagen vascular disease ([place])   . Fibromyalgia   . Ganglion cyst of wrist 12/2012   left dorsal cyst  . GERD (gastroesophageal reflux disease)    TUMS as needed  . Headache(784.0)    tension/migraines  . PONV (postoperative nausea and vomiting)    also states stopped breathing after hernia surgery  . Rectal bleeding   . Rheumatoid arthritis ([place])   . Stenosing tenosynovitis of finger 12/2012   left middle finger  . 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 [facility])     Patient Active Problem List   Diagnosis Date Noted  . Gastroesophageal reflux disease without esophagitis 10/23/2018  . Abdominal pain, epigastric 10/23/2018  . Chest pain 10/16/2018  . Stiffness of joint, not elsewhere classified, ankle and foot 09/07/2014  . Pain in joint, ankle and foot 09/07/2014  . Difficulty in walking(719.7) 09/07/2014  . 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 Hematochezia 08/11/2013  . Constipation 08/11/2013  . RHEUMATOID ARTHRITIS 02/20/2010  . KNEE PAIN 02/20/2010  . FIBROMYALGIA 02/20/2010    Past Surgical History:  Procedure Laterality Date  . ABDOMINAL HYSTERECTOMY  1994  .  APPENDECTOMY  1996  . BIOPSY  12/01/2018   Procedure: BIOPSY;  Surgeon: Rogene Houston, MD;  [facility];  Service: Endoscopy;;  gastric   . COLONOSCOPY N/A 05/08/2013   Procedure: COLONOSCOPY;  Surgeon: Rogene Houston, MD;  [facility];  Service: Endoscopy;  Laterality: N/A;  240  . COLONOSCOPY N/A 09/23/2015   Procedure: COLONOSCOPY;  Surgeon: Rogene Houston, MD;  [facility];  Service: Endoscopy;  Laterality: N/A;  825  . CYSTOSCOPY WITH HYDRODISTENSION AND BIOPSY  04/01/2007  . [place]   left tube and ovary removed  . ESOPHAGOGASTRODUODENOSCOPY N/A 12/01/2018   Procedure: ESOPHAGOGASTRODUODENOSCOPY (EGD);  Surgeon: Rogene Houston, MD;  [facility];  Service: Endoscopy;  Laterality: N/A;  12:45  . GANGLION CYST EXCISION  12/17/2012   Procedure: REMOVAL GANGLION OF WRIST;  Surgeon: Wynonia Sours, MD;  Location: [place];  Service: Orthopedics;  Laterality: Left;  . HARDWARE REMOVAL Right 08/09/2014   Procedure: Placement Right Ankle Tight Rope, Removal Plate and Screws;  Surgeon: Marybelle Killings, MD;  Location: [place];  Service: Orthopedics;  Laterality: Right;  . PUBOVAGINAL SLING  2010  . rt ankle surgery    . SALPINGOOPHORECTOMY  1996   right  . TRIGGER FINGER RELEASE  12/17/2012   Procedure: RELEASE TRIGGER FINGER/A-1 PULLEY;  Surgeon: Wynonia Sours, MD;  [facility] [place]  [facility];  Service: Orthopedics;  Laterality: Left;  . TUBAL LIGATION  1992  . VAGINA RECONSTRUCTION SURGERY     x 5 since 2010  . VENTRAL HERNIA REPAIR  09/06/2006     OB History    Gravida  3   Para  2   Term  2   Preterm  0   AB  1   Living  2     SAB      TAB      Ectopic  1   Multiple      Live Births  2            Home Medications    Prior to Admission medications   Medication Sig Start Date End Date Taking? Authorizing Provider  amLODipine (NORVASC) 5 MG tablet Take 1 tablet (5 mg  total) by mouth daily. 05/18/19 08/16/19 Yes Herminio Commons, MD  aspirin-acetaminophen-caffeine (EXCEDRIN MIGRAINE) [phone] MG tablet Take 2 tablets by mouth every 6 (six) hours as needed for headache or migraine.    Yes [provider]  augmented betamethasone dipropionate (DIPROLENE-AF) 0.05 % cream Apply 1 application topically at bedtime.  09/23/18  Yes [provider]  Cholecalciferol (VITAMIN D3) 125 MCG (5000 UT) CAPS Take 5,000 Units by mouth daily.    Yes [provider]  escitalopram (LEXAPRO) 5 MG tablet Take 5 mg by mouth daily.   Yes [provider]  estradiol (ESTRACE) 2 MG tablet TAKE 1 TABLET BY MOUTH EVERY DAY Patient taking differently: Take 2 mg by mouth daily.  03/24/19  Yes Roma Schanz, CNM  gabapentin (NEURONTIN) 400 MG capsule Take 1 capsule (400 mg total) by mouth at bedtime. 12/25/16  Yes Setzer, Terri L, NP  pantoprazole (PROTONIX) 40 MG tablet Take 40 mg by mouth daily.   Yes [provider]    Family History Family History  Problem Relation Age of Onset  . Lung cancer Mother   . Cervical cancer Mother   . Heart disease Father   . Leukemia Father   . Cancer Maternal Grandmother        bladder and colon cancer    Social History Social History   Tobacco Use  . Smoking status: Never Smoker  . Smokeless tobacco: Never Used  Substance Use Topics  . Alcohol use: No  . Drug use: No     Allergies   Imitrex [sumatriptan], Cymbalta [duloxetine hcl], Latex, Propoxyphene n-acetaminophen, and Tramadol   Review of Systems Review of Systems  All other systems reviewed and are negative.    Physical Exam Updated Vital Signs BP 123/80   Pulse 65   Temp 98.3 F (36.8 C) (Oral)   Resp 18   SpO2 97%   Physical Exam Vitals signs and nursing note reviewed.  Constitutional:      Appearance: She is well-developed.  HENT:     Head: Normocephalic and atraumatic.  Eyes:     Conjunctiva/sclera:  Conjunctivae normal.  Neck:     Musculoskeletal: Neck supple.  Cardiovascular:     Rate and Rhythm: Normal rate and regular rhythm.  Pulmonary:     Effort: Pulmonary effort is normal.     Breath sounds: Normal breath sounds.  Abdominal:     General: Bowel sounds are normal.     Palpations: Abdomen is soft.  Musculoskeletal: Normal range of motion.  Skin:    General: Skin is warm and dry.  Neurological:     Mental Status: She is alert  and oriented to person, place, and time.  Psychiatric:        Behavior: Behavior normal.      ED Treatments / Results  Labs (all labs ordered are listed, but only abnormal results are displayed) Labs Reviewed  BASIC METABOLIC PANEL - Abnormal; Notable for the following components:      Result Value   Calcium 8.4 (*)    All other components within normal limits  CBC - Abnormal; Notable for the following components:   RBC 3.75 (*)    All other components within normal limits  TROPONIN I (HIGH SENSITIVITY)  TROPONIN I (HIGH SENSITIVITY)    EKG EKG Interpretation  Date/Time:  Thursday June 04 2019 16:16:47 EDT Ventricular Rate:  91 PR Interval:    QRS Duration: 88 QT Interval:  348 QTC Calculation: 429 R Axis:   64 Text Interpretation:  Sinus rhythm Confirmed by Nat Christen [phone]) on 06/04/2019 5:31:25 PM   Radiology Dg Chest 2 View  Result Date: 06/04/2019 CLINICAL DATA:  Chest pain EXAM: CHEST - 2 VIEW COMPARISON:  October 16, 2018 FINDINGS: The heart size and mediastinal contours are within normal limits. Both lungs are clear. The visualized skeletal structures are unremarkable. IMPRESSION: No active cardiopulmonary disease. Electronically Signed   By: Abelardo Diesel M.D.   On: 06/04/2019 18:06    Procedures Procedures (including critical care time)  Medications Ordered in ED Medications  sodium chloride flush (NS) 0.9 % injection 3 mL (3 mLs Intravenous Not Given 06/04/19 1649)     Initial Impression / Assessment and Plan /  ED Course  I have reviewed the triage vital signs and the nursing notes.  Pertinent labs & imaging results that were available during my care of the patient were reviewed by me and considered in my medical decision making (see chart for details).        Patient is hemodynamically stable.  Will initiate cardiac work-up.  2045: Recheck.  Patient is stable.  Delta troponin negative.  Suspect Lexapro could have contributed to patient's syncopal episode.  Will discontinue Lexapro.  Follow-up with primary care. Final Clinical Impressions(s) / ED Diagnoses   Final diagnoses:  Chest pain, unspecified type    ED Discharge Orders    None       Nat Christen, MD 06/04/19 1703    Nat Christen, MD 06/04/19 2056

## 2019-06-04 NOTE — ED Notes (Signed)
Pt ambulatory to waiting room. Pt verbalized understanding of discharge instructions.   

## 2019-06-04 NOTE — Discharge Instructions (Addendum)
Recommend discontinuation of Lexapro.  Otherwise tests were good.  Follow-up with your primary care doctor.

## 2019-06-04 NOTE — ED Triage Notes (Signed)
Patient reports she was awakened during the night with severe leg cramps. Once the cramps had passed she got up to go to the bathroom and had LOC. Awakened later and was weak and diaphoretic. C/O chest pain and weakness that has continued today.

## 2019-06-04 NOTE — ED Notes (Signed)
Patient transported to X-ray 

## 2019-06-09 ENCOUNTER — Telehealth: Payer: Self-pay

## 2019-06-09 MED ORDER — LISINOPRIL 5 MG PO TABS: 5.0000 mg | ORAL_TABLET | Freq: Every day | ORAL | 3 refills | Status: DC

## 2019-06-09 NOTE — Telephone Encounter (Signed)
Pt  Made aware, Sent in RX.

## 2019-06-09 NOTE — Telephone Encounter (Signed)
Pt called to inform Dr. Bronson Ing that she is taking herself off of the amlodipine. Since starting medication, she has stayed dizzy, light headed and has no energy. She stated if you want her to try another medication, it is fine but this one does not agree with her.

## 2019-06-09 NOTE — Telephone Encounter (Signed)
ERROR

## 2019-06-09 NOTE — Telephone Encounter (Signed)
Could start with low dose lisinopril 5 mg daily.

## 2019-06-18 ENCOUNTER — Emergency Department (HOSPITAL_COMMUNITY): Payer: BC Managed Care – PPO

## 2019-06-18 ENCOUNTER — Other Ambulatory Visit: Payer: Self-pay

## 2019-06-18 ENCOUNTER — Inpatient Hospital Stay (HOSPITAL_COMMUNITY)
Admission: EM | Admit: 2019-06-18 | Discharge: 2019-06-23 | DRG: 387 | Disposition: A | Discharge status: Home / Self Care | Payer: BC Managed Care – PPO | Attending: Internal Medicine | Admitting: Internal Medicine

## 2019-06-18 ENCOUNTER — Encounter (HOSPITAL_COMMUNITY): Payer: Self-pay | Admitting: *Deleted

## 2019-06-18 DIAGNOSIS — Z801 Family history of malignant neoplasm of trachea, bronchus and lung: Secondary | ICD-10-CM

## 2019-06-18 DIAGNOSIS — D519 Vitamin B12 deficiency anemia, unspecified: Secondary | ICD-10-CM

## 2019-06-18 DIAGNOSIS — K50018 Crohn's disease of small intestine with other complication: Secondary | ICD-10-CM | POA: Diagnosis not present

## 2019-06-18 DIAGNOSIS — Z800 Family history of malignant neoplasm of digestive organs: Secondary | ICD-10-CM | POA: Diagnosis not present

## 2019-06-18 DIAGNOSIS — Z8541 Personal history of malignant neoplasm of cervix uteri: Secondary | ICD-10-CM | POA: Diagnosis not present

## 2019-06-18 DIAGNOSIS — R1013 Epigastric pain: Secondary | ICD-10-CM | POA: Diagnosis not present

## 2019-06-18 DIAGNOSIS — Z20828 Contact with and (suspected) exposure to other viral communicable diseases: Secondary | ICD-10-CM | POA: Diagnosis present

## 2019-06-18 DIAGNOSIS — R1010 Upper abdominal pain, unspecified: Secondary | ICD-10-CM | POA: Diagnosis present

## 2019-06-18 DIAGNOSIS — Z8049 Family history of malignant neoplasm of other genital organs: Secondary | ICD-10-CM | POA: Diagnosis not present

## 2019-06-18 DIAGNOSIS — K5000 Crohn's disease of small intestine without complications: Secondary | ICD-10-CM | POA: Diagnosis not present

## 2019-06-18 DIAGNOSIS — R110 Nausea: Secondary | ICD-10-CM | POA: Diagnosis not present

## 2019-06-18 DIAGNOSIS — K219 Gastro-esophageal reflux disease without esophagitis: Secondary | ICD-10-CM

## 2019-06-18 DIAGNOSIS — E538 Deficiency of other specified B group vitamins: Secondary | ICD-10-CM | POA: Diagnosis present

## 2019-06-18 DIAGNOSIS — M797 Fibromyalgia: Secondary | ICD-10-CM | POA: Diagnosis present

## 2019-06-18 DIAGNOSIS — Z806 Family history of leukemia: Secondary | ICD-10-CM | POA: Diagnosis not present

## 2019-06-18 DIAGNOSIS — R0789 Other chest pain: Secondary | ICD-10-CM | POA: Diagnosis not present

## 2019-06-18 DIAGNOSIS — I10 Essential (primary) hypertension: Secondary | ICD-10-CM | POA: Diagnosis not present

## 2019-06-18 DIAGNOSIS — Z79890 Hormone replacement therapy (postmenopausal): Secondary | ICD-10-CM | POA: Diagnosis not present

## 2019-06-18 DIAGNOSIS — K5900 Constipation, unspecified: Secondary | ICD-10-CM | POA: Diagnosis present

## 2019-06-18 DIAGNOSIS — Z03818 Encounter for observation for suspected exposure to other biological agents ruled out: Secondary | ICD-10-CM | POA: Diagnosis not present

## 2019-06-18 DIAGNOSIS — M069 Rheumatoid arthritis, unspecified: Secondary | ICD-10-CM | POA: Diagnosis not present

## 2019-06-18 DIAGNOSIS — R109 Unspecified abdominal pain: Secondary | ICD-10-CM | POA: Diagnosis not present

## 2019-06-18 DIAGNOSIS — K529 Noninfective gastroenteritis and colitis, unspecified: Secondary | ICD-10-CM | POA: Diagnosis not present

## 2019-06-18 DIAGNOSIS — Z8249 Family history of ischemic heart disease and other diseases of the circulatory system: Secondary | ICD-10-CM

## 2019-06-18 LAB — CBC WITH DIFFERENTIAL/PLATELET
Abs Immature Granulocytes: 0.05 10*3/uL (ref 0.00–0.07)
Basophils Absolute: 0.1 10*3/uL (ref 0.0–0.1)
Basophils Relative: 0 %
Eosinophils Absolute: 0.2 10*3/uL (ref 0.0–0.5)
Eosinophils Relative: 1 %
HCT: 38.7 % (ref 36.0–46.0)
Hemoglobin: 12.7 g/dL (ref 12.0–15.0)
Immature Granulocytes: 0 %
Lymphocytes Relative: 26 %
Lymphs Abs: 3.4 10*3/uL (ref 0.7–4.0)
MCH: 32.6 pg (ref 26.0–34.0)
MCHC: 32.8 g/dL (ref 30.0–36.0)
MCV: 99.5 fL (ref 80.0–100.0)
Monocytes Absolute: 0.9 10*3/uL (ref 0.1–1.0)
Monocytes Relative: 7 %
Neutro Abs: 8.3 10*3/uL — ABNORMAL HIGH (ref 1.7–7.7)
Neutrophils Relative %: 66 %
Platelets: 223 10*3/uL (ref 150–400)
RBC: 3.89 MIL/uL (ref 3.87–5.11)
RDW: 13.1 % (ref 11.5–15.5)
WBC: 12.8 10*3/uL — ABNORMAL HIGH (ref 4.0–10.5)
nRBC: 0 % (ref 0.0–0.2)

## 2019-06-18 MED ORDER — SODIUM CHLORIDE 0.9 % IV BOLUS
1000.0000 mL | Freq: Once | INTRAVENOUS | Status: AC
  Administered 2019-06-18: 1000 mL via INTRAVENOUS

## 2019-06-18 MED ORDER — FENTANYL CITRATE (PF) 100 MCG/2ML IJ SOLN
50.0000 ug | Freq: Once | INTRAMUSCULAR | Status: AC
  Administered 2019-06-18: 50 ug via INTRAVENOUS
  Filled 2019-06-18: qty 2

## 2019-06-18 MED ORDER — ONDANSETRON HCL 4 MG/2ML IJ SOLN
4.0000 mg | Freq: Once | INTRAMUSCULAR | Status: AC
  Administered 2019-06-18: 4 mg via INTRAVENOUS
  Filled 2019-06-18: qty 2

## 2019-06-18 NOTE — ED Triage Notes (Signed)
Pt with generalized abd pain since this morning, pt states LBM was 2 weeks ago.  Pt states pain with urination or passing gas.  + N/V, denies diarrhea.  Denies hx of constipation

## 2019-06-19 ENCOUNTER — Encounter (HOSPITAL_COMMUNITY): Payer: Self-pay | Admitting: Internal Medicine

## 2019-06-19 DIAGNOSIS — K5000 Crohn's disease of small intestine without complications: Secondary | ICD-10-CM | POA: Diagnosis not present

## 2019-06-19 DIAGNOSIS — R1013 Epigastric pain: Secondary | ICD-10-CM | POA: Diagnosis not present

## 2019-06-19 DIAGNOSIS — K5900 Constipation, unspecified: Secondary | ICD-10-CM

## 2019-06-19 DIAGNOSIS — K529 Noninfective gastroenteritis and colitis, unspecified: Secondary | ICD-10-CM | POA: Diagnosis not present

## 2019-06-19 LAB — BASIC METABOLIC PANEL
Anion gap: 7 (ref 5–15)
BUN: 12 mg/dL (ref 6–20)
CO2: 22 mmol/L (ref 22–32)
Calcium: 7.9 mg/dL — ABNORMAL LOW (ref 8.9–10.3)
Chloride: 107 mmol/L (ref 98–111)
Creatinine, Ser: 0.75 mg/dL (ref 0.44–1.00)
GFR calc Af Amer: >60 mL/min (ref >60)
GFR calc non Af Amer: >60 mL/min (ref >60)
Glucose, Bld: 106 mg/dL — ABNORMAL HIGH (ref 70–99)
Potassium: 4 mmol/L (ref 3.5–5.1)
Sodium: 136 mmol/L (ref 135–145)

## 2019-06-19 LAB — URINALYSIS, ROUTINE W REFLEX MICROSCOPIC
Bilirubin Urine: NEGATIVE
Glucose, UA: NEGATIVE mg/dL
Hgb urine dipstick: NEGATIVE
Ketones, ur: NEGATIVE mg/dL
Leukocytes,Ua: NEGATIVE
Nitrite: NEGATIVE
Protein, ur: NEGATIVE mg/dL
Specific Gravity, Urine: >1.046 — ABNORMAL HIGH (ref 1.005–1.030)
pH: 6 (ref 5.0–8.0)

## 2019-06-19 LAB — COMPREHENSIVE METABOLIC PANEL
ALT: 13 U/L (ref 0–44)
AST: 16 U/L (ref 15–41)
Albumin: 3.8 g/dL (ref 3.5–5.0)
Alkaline Phosphatase: 55 U/L (ref 38–126)
Anion gap: 9 (ref 5–15)
BUN: 19 mg/dL (ref 6–20)
CO2: 25 mmol/L (ref 22–32)
Calcium: 8.8 mg/dL — ABNORMAL LOW (ref 8.9–10.3)
Chloride: 103 mmol/L (ref 98–111)
Creatinine, Ser: 0.84 mg/dL (ref 0.44–1.00)
GFR calc Af Amer: >60 mL/min (ref >60)
GFR calc non Af Amer: >60 mL/min (ref >60)
Glucose, Bld: 111 mg/dL — ABNORMAL HIGH (ref 70–99)
Potassium: 4.1 mmol/L (ref 3.5–5.1)
Sodium: 137 mmol/L (ref 135–145)
Total Bilirubin: 0.5 mg/dL (ref 0.3–1.2)
Total Protein: 7 g/dL (ref 6.5–8.1)

## 2019-06-19 LAB — CBC WITH DIFFERENTIAL/PLATELET
Abs Immature Granulocytes: 0.04 10*3/uL (ref 0.00–0.07)
Abs Immature Granulocytes: 0.05 10*3/uL (ref 0.00–0.07)
Basophils Absolute: 0 10*3/uL (ref 0.0–0.1)
Basophils Absolute: 0 10*3/uL (ref 0.0–0.1)
Basophils Relative: 0 %
Basophils Relative: 0 %
Eosinophils Absolute: 0 10*3/uL (ref 0.0–0.5)
Eosinophils Absolute: 0 10*3/uL (ref 0.0–0.5)
Eosinophils Relative: 0 %
Eosinophils Relative: 0 %
HCT: 34.5 % — ABNORMAL LOW (ref 36.0–46.0)
HCT: 34.9 % — ABNORMAL LOW (ref 36.0–46.0)
Hemoglobin: 11.4 g/dL — ABNORMAL LOW (ref 12.0–15.0)
Hemoglobin: 11.3 g/dL — ABNORMAL LOW (ref 12.0–15.0)
Immature Granulocytes: 0 %
Immature Granulocytes: 0 %
Lymphocytes Relative: 18 %
Lymphocytes Relative: 24 %
Lymphs Abs: 1.9 10*3/uL (ref 0.7–4.0)
Lymphs Abs: 2.7 10*3/uL (ref 0.7–4.0)
MCH: 33.1 pg (ref 26.0–34.0)
MCH: 32.4 pg (ref 26.0–34.0)
MCHC: 33 g/dL (ref 30.0–36.0)
MCHC: 32.4 g/dL (ref 30.0–36.0)
MCV: 100.3 fL — ABNORMAL HIGH (ref 80.0–100.0)
MCV: 100 fL (ref 80.0–100.0)
Monocytes Absolute: 0.5 10*3/uL (ref 0.1–1.0)
Monocytes Absolute: 0.7 10*3/uL (ref 0.1–1.0)
Monocytes Relative: 5 %
Monocytes Relative: 6 %
Neutro Abs: 8.2 10*3/uL — ABNORMAL HIGH (ref 1.7–7.7)
Neutro Abs: 7.8 10*3/uL — ABNORMAL HIGH (ref 1.7–7.7)
Neutrophils Relative %: 77 %
Neutrophils Relative %: 70 %
Platelets: 203 10*3/uL (ref 150–400)
Platelets: 213 10*3/uL (ref 150–400)
RBC: 3.44 MIL/uL — ABNORMAL LOW (ref 3.87–5.11)
RBC: 3.49 MIL/uL — ABNORMAL LOW (ref 3.87–5.11)
RDW: 12.9 % (ref 11.5–15.5)
RDW: 13 % (ref 11.5–15.5)
WBC: 10.6 10*3/uL — ABNORMAL HIGH (ref 4.0–10.5)
WBC: 11.2 10*3/uL — ABNORMAL HIGH (ref 4.0–10.5)
nRBC: 0 % (ref 0.0–0.2)
nRBC: 0 % (ref 0.0–0.2)

## 2019-06-19 LAB — LACTIC ACID, PLASMA
Lactic Acid, Venous: 0.7 mmol/L (ref 0.5–1.9)
Lactic Acid, Venous: 1.3 mmol/L (ref 0.5–1.9)

## 2019-06-19 LAB — MAGNESIUM: Magnesium: 1.7 mg/dL (ref 1.7–2.4)

## 2019-06-19 LAB — PHOSPHORUS: Phosphorus: 2.6 mg/dL (ref 2.5–4.6)

## 2019-06-19 LAB — LIPASE, BLOOD: Lipase: 27 U/L (ref 11–51)

## 2019-06-19 LAB — SARS CORONAVIRUS 2 BY RT PCR (HOSPITAL ORDER, PERFORMED IN ~~LOC~~ HOSPITAL LAB): SARS Coronavirus 2: NEGATIVE

## 2019-06-19 MED ORDER — FENTANYL CITRATE (PF) 100 MCG/2ML IJ SOLN
25.0000 ug | Freq: Once | INTRAMUSCULAR | Status: AC
  Administered 2019-06-19: 21:00:00 25 ug via INTRAVENOUS
  Filled 2019-06-19: qty 2

## 2019-06-19 MED ORDER — KETOROLAC TROMETHAMINE 30 MG/ML IJ SOLN: 30.0000 mg | Freq: Four times a day (QID) | INTRAMUSCULAR | Status: DC | PRN

## 2019-06-19 MED ORDER — FENTANYL CITRATE (PF) 100 MCG/2ML IJ SOLN
25.0000 ug | INTRAMUSCULAR | Status: DC | PRN
  Administered 2019-06-19: 25 ug via INTRAVENOUS
  Filled 2019-06-19: qty 2

## 2019-06-19 MED ORDER — PROCHLORPERAZINE EDISYLATE 10 MG/2ML IJ SOLN
5.0000 mg | Freq: Once | INTRAMUSCULAR | Status: AC
  Administered 2019-06-19: 5 mg via INTRAVENOUS
  Filled 2019-06-19: qty 2

## 2019-06-19 MED ORDER — POLYETHYLENE GLYCOL 3350 17 G PO PACK
17.0000 g | PACK | Freq: Two times a day (BID) | ORAL | Status: DC
  Administered 2019-06-19 – 2019-06-20 (×2): 17 g via ORAL
  Filled 2019-06-19 (×7): qty 1

## 2019-06-19 MED ORDER — ONDANSETRON HCL 4 MG/2ML IJ SOLN
4.0000 mg | Freq: Once | INTRAMUSCULAR | Status: AC
  Administered 2019-06-19: 4 mg via INTRAVENOUS
  Filled 2019-06-19: qty 2

## 2019-06-19 MED ORDER — PANTOPRAZOLE SODIUM 40 MG PO TBEC
40.0000 mg | DELAYED_RELEASE_TABLET | Freq: Every day | ORAL | Status: DC
  Administered 2019-06-19 – 2019-06-21 (×3): 40 mg via ORAL
  Filled 2019-06-19 (×3): qty 1

## 2019-06-19 MED ORDER — SODIUM CHLORIDE 0.9 % IV SOLN
INTRAVENOUS | Status: DC
  Administered 2019-06-19 – 2019-06-23 (×8): via INTRAVENOUS

## 2019-06-19 MED ORDER — LISINOPRIL 5 MG PO TABS
5.0000 mg | ORAL_TABLET | Freq: Every day | ORAL | Status: DC
  Administered 2019-06-20 – 2019-06-23 (×4): 5 mg via ORAL
  Filled 2019-06-19 (×5): qty 1

## 2019-06-19 MED ORDER — ONDANSETRON HCL 4 MG/2ML IJ SOLN
4.0000 mg | Freq: Four times a day (QID) | INTRAMUSCULAR | Status: DC | PRN
  Administered 2019-06-19 – 2019-06-23 (×7): 4 mg via INTRAVENOUS
  Filled 2019-06-19 (×8): qty 2

## 2019-06-19 MED ORDER — HYDROMORPHONE HCL 1 MG/ML IJ SOLN: 1.0000 mg | INTRAMUSCULAR | Status: DC | PRN

## 2019-06-19 MED ORDER — ENOXAPARIN SODIUM 40 MG/0.4ML ~~LOC~~ SOLN: 40.0000 mg | SUBCUTANEOUS | Status: DC

## 2019-06-19 MED ORDER — ESTRADIOL 1 MG PO TABS
2.0000 mg | ORAL_TABLET | Freq: Every day | ORAL | Status: DC
  Administered 2019-06-19 – 2019-06-23 (×5): 2 mg via ORAL
  Filled 2019-06-19 (×5): qty 2

## 2019-06-19 MED ORDER — ONDANSETRON HCL 4 MG PO TABS
4.0000 mg | ORAL_TABLET | Freq: Four times a day (QID) | ORAL | Status: DC | PRN
  Administered 2019-06-19 – 2019-06-22 (×2): 4 mg via ORAL
  Filled 2019-06-19 (×2): qty 1

## 2019-06-19 MED ORDER — CIPROFLOXACIN IN D5W 400 MG/200ML IV SOLN
400.0000 mg | Freq: Two times a day (BID) | INTRAVENOUS | Status: DC
  Administered 2019-06-19 – 2019-06-21 (×5): 400 mg via INTRAVENOUS
  Filled 2019-06-19 (×5): qty 200

## 2019-06-19 MED ORDER — METRONIDAZOLE IN NACL 5-0.79 MG/ML-% IV SOLN
500.0000 mg | Freq: Three times a day (TID) | INTRAVENOUS | Status: DC
  Administered 2019-06-19 – 2019-06-21 (×7): 500 mg via INTRAVENOUS
  Filled 2019-06-19 (×7): qty 100

## 2019-06-19 MED ORDER — HYDROMORPHONE HCL 1 MG/ML IJ SOLN
1.0000 mg | INTRAMUSCULAR | Status: AC
  Administered 2019-06-19: 1 mg via INTRAVENOUS
  Filled 2019-06-19: qty 1

## 2019-06-19 MED ORDER — MILK AND MOLASSES ENEMA
1.0000 | Freq: Once | RECTAL | Status: AC
  Administered 2019-06-19: 240 mL via RECTAL
  Filled 2019-06-19: qty 240

## 2019-06-19 MED ORDER — SODIUM CHLORIDE 0.45 % IV SOLN: INTRAVENOUS | Status: DC

## 2019-06-19 MED ORDER — HYDROMORPHONE HCL 1 MG/ML IJ SOLN: 0.5000 mg | INTRAMUSCULAR | Status: DC | PRN

## 2019-06-19 MED ORDER — GABAPENTIN 400 MG PO CAPS
400.0000 mg | ORAL_CAPSULE | Freq: Every day | ORAL | Status: DC
  Administered 2019-06-19 – 2019-06-22 (×4): 400 mg via ORAL
  Filled 2019-06-19 (×4): qty 1

## 2019-06-19 MED ORDER — IOHEXOL 300 MG/ML  SOLN
100.0000 mL | Freq: Once | INTRAMUSCULAR | Status: AC | PRN
  Administered 2019-06-19: 100 mL via INTRAVENOUS

## 2019-06-19 NOTE — H&P (Addendum)
History and Physical    [name] BDZ:[id number] DOB: October 28, 1966 DOA: 06/18/2019  PCP: Asencion Noble, MD   Patient coming from: Home.  Gastroenterologist: Dr. Delane Ginger. Rehman.  I have personally briefly reviewed patient's old medical records in [place]  Chief Complaint: Abdominal pain with constipation, nausea and vomiting  HPI: [name] is a 53 y.o. 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  Her appetite has been decreased for several days.  She denies diarrhea, melena or hematochezia.  No dysuria, frequency or hematuria.  She denies fever, night sweats, but complains of chills and fatigue.  No sore throat, rhinorrhea, wheezing, hemoptysis or dyspnea.  Denies chest pain, palpitations, diaphoresis, PND, orthopnea or lower extremity edema.  She occasionally gets positional lightheadedness.  She denies polyuria, polydipsia, polyphagia or blurred vision.  She states that she feels a little anxious about her symptoms and has had trouble sleeping.  ED Course: Initial vital signs temperature 99.3 F, pulse 100, respirations 18, blood pressure 118/76 and O2 sat 95% on room air.  The patient received 2 L of normal saline bolus, 2 doses of ondansetron 4 mg IVP and 50 mcg of fentanyl IVP.  Her work-up shows urinalysis with a specific gravity of more than 1.0 46, but otherwise unremarkable.  CBC had a white count of 12.8 with 60% neutrophils, 26% lymphocytes and 7% monocytes.  Hemoglobin was 12.7 g/dL and platelets 223.  CMP showed within range results,  except for a glucose of 111 and calcium of 8.8 mg/dL, which normalizes with albumin level correction.  Lipase was 27 units/L.  Review of Systems: As per HPI otherwise 10 point review of systems negative.   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 [facility])     Past Surgical History:  Procedure Laterality Date   [place]   BIOPSY  12/01/2018   Procedure: BIOPSY;  Surgeon: Rogene Houston, MD;  [facility];  Service: Endoscopy;;  gastric    COLONOSCOPY N/A 05/08/2013   Procedure: COLONOSCOPY;  Surgeon: Rogene Houston, MD;  [facility];  Service: Endoscopy;  Laterality: N/A;  240   COLONOSCOPY N/A 09/23/2015   Procedure: COLONOSCOPY;  Surgeon: Rogene Houston, MD;  [facility];  Service: Endoscopy;  Laterality: N/A;  [place] AND BIOPSY  04/01/2007   ECTOPIC PREGNANCY SURGERY  1995   left tube and ovary removed  ESOPHAGOGASTRODUODENOSCOPY N/A 12/01/2018   Procedure: ESOPHAGOGASTRODUODENOSCOPY (EGD);  Surgeon: Rogene Houston, MD;  [facility];  Service: Endoscopy;  Laterality: N/A;  12:45   GANGLION CYST EXCISION  12/17/2012   Procedure: REMOVAL GANGLION OF WRIST;  Surgeon: Wynonia Sours, MD;  Location: [place];  Service: Orthopedics;  Laterality: Left;   HARDWARE REMOVAL Right 08/09/2014   Procedure:  Placement Right Ankle Tight Rope, Removal Plate and Screws;  Surgeon: Marybelle Killings, MD;  Location: [place];  Service: Orthopedics;  Laterality: Right;   PUBOVAGINAL SLING  2010   rt ankle surgery     SALPINGOOPHORECTOMY  1996   right   TRIGGER FINGER RELEASE  12/17/2012   Procedure: RELEASE TRIGGER FINGER/A-1 PULLEY;  Surgeon: Wynonia Sours, MD;  Location: [place];  Service: Orthopedics;  Laterality: Left;   TUBAL LIGATION  1992   VAGINA RECONSTRUCTION SURGERY     x 5 since 2010   [place]  09/06/2006     reports that she has never smoked. She has never used smokeless tobacco. She reports that she does not drink alcohol or use drugs.  Allergies  Allergen Reactions   Imitrex [Sumatriptan]     Causes a migraine   Cymbalta [Duloxetine Hcl] Rash   Latex Rash   Propoxyphene N-Acetaminophen Nausea And Vomiting and Rash   Tramadol Rash    Family History  Problem Relation Age of Onset   Lung cancer Mother    Cervical cancer Mother    Heart disease Father    Leukemia Father    Cancer Maternal Grandmother        bladder and colon cancer   Prior to Admission medications   Medication Sig Start Date End Date Taking? Authorizing Provider  estradiol (ESTRACE) 2 MG tablet TAKE 1 TABLET BY MOUTH EVERY DAY Patient taking differently: Take 2 mg by mouth daily.  03/24/19  Yes Roma Schanz, CNM  gabapentin (NEURONTIN) 400 MG capsule Take 1 capsule (400 mg total) by mouth at bedtime. 12/25/16  Yes Setzer, Terri L, NP  lisinopril (ZESTRIL) 5 MG tablet Take 1 tablet (5 mg total) by mouth daily. 06/09/19 09/07/19 Yes Herminio Commons, MD  pantoprazole (PROTONIX) 40 MG tablet Take 40 mg by mouth daily.   Yes [provider]  aspirin-acetaminophen-caffeine (EXCEDRIN MIGRAINE) [phone] MG tablet Take 2 tablets by mouth every 6 (six) hours as needed for headache or migraine.     [provider]  augmented betamethasone dipropionate  (DIPROLENE-AF) 0.05 % cream Apply 1 application topically at bedtime.  09/23/18   [provider]  Cholecalciferol (VITAMIN D3) 125 MCG (5000 UT) CAPS Take 5,000 Units by mouth daily.     [provider]    Physical Exam: Vitals:   06/18/19 2306 06/19/19 0000 06/19/19 0420 06/19/19 0430  BP:  106/71  (!) 101/59  Pulse:  81  72  Resp:  18  18  Temp: 99.3 F (37.4 C)  99.2 F (37.3 C)   TempSrc: Oral  Oral   SpO2:  95%  94%  Weight:      Height:        Constitutional: Looks acutely ill. Eyes: PERRL, lids and conjunctivae normal ENMT: Mucous membranes are mildly dry. Posterior pharynx clear of any exudate or lesions. Neck: normal, supple, no masses, no thyromegaly Respiratory: clear to auscultation bilaterally, no wheezing, no crackles. Normal respiratory effort. No accessory muscle use.  Cardiovascular: Regular rate and rhythm, no  murmurs / rubs / gallops. No extremity edema. 2+ pedal pulses. No carotid bruits.  Abdomen: Soft, positive for gastric tenderness, no guarding or rebound, no masses palpated. No hepatosplenomegaly. Bowel sounds positive.  Musculoskeletal: no clubbing / cyanosis.  Good ROM, no contractures. Normal muscle tone.  Skin: Positive mild erythematous rash on ankle area, otherwise no other obvious lesions, ulcers on limited dermatological examination. Neurologic: CN 2-12 grossly intact. Sensation intact, DTR normal. Strength 5/5 in all 4.  Psychiatric: Normal judgment and insight. Alert and oriented x 3. Normal mood.   Labs on Admission: I have personally reviewed following labs and imaging studies  CBC: Recent Labs  Lab 06/18/19 2338  WBC 12.8*  NEUTROABS 8.3*  HGB 12.7  HCT 38.7  MCV 99.5  PLT 867   Basic Metabolic Panel: Recent Labs  Lab 06/18/19 2338  NA 137  K 4.1  CL 103  CO2 25  GLUCOSE 111*  BUN 19  CREATININE 0.84  CALCIUM 8.8*   GFR: Estimated Creatinine Clearance: 79.1 mL/min (by C-G formula based on SCr of 0.84  mg/dL). Liver Function Tests: Recent Labs  Lab 06/18/19 2338  AST 16  ALT 13  ALKPHOS 55  BILITOT 0.5  PROT 7.0  ALBUMIN 3.8   Recent Labs  Lab 06/18/19 2338  LIPASE 27   No results for input(s): AMMONIA in the last 168 hours. Coagulation Profile: No results for input(s): INR, PROTIME in the last 168 hours. Cardiac Enzymes: No results for input(s): CKTOTAL, CKMB, CKMBINDEX, TROPONINI in the last 168 hours. BNP (last 3 results) No results for input(s): PROBNP in the last 8760 hours. HbA1C: No results for input(s): HGBA1C in the last 72 hours. CBG: No results for input(s): GLUCAP in the last 168 hours. Lipid Profile: No results for input(s): CHOL, HDL, LDLCALC, TRIG, CHOLHDL, LDLDIRECT in the last 72 hours. Thyroid Function Tests: No results for input(s): TSH, T4TOTAL, FREET4, T3FREE, THYROIDAB in the last 72 hours. Anemia Panel: No results for input(s): VITAMINB12, FOLATE, FERRITIN, TIBC, IRON, RETICCTPCT in the last 72 hours. Urine analysis:    Component Value Date/Time   COLORURINE YELLOW 06/18/2019 2338   APPEARANCEUR CLEAR 06/18/2019 2338   LABSPEC >1.046 (H) 06/18/2019 2338   PHURINE 6.0 06/18/2019 [place] 06/18/2019 2338   [place] NEGATIVE 06/18/2019 [place] 06/18/2019 2338   KETONESUR NEGATIVE 06/18/2019 2338   PROTEINUR NEGATIVE 06/18/2019 2338   UROBILINOGEN 0.2 10/14/2013 1144   NITRITE NEGATIVE 06/18/2019 2338   LEUKOCYTESUR NEGATIVE 06/18/2019 2338    Radiological Exams on Admission: Dg Abdomen 1 View  Result Date: 06/18/2019 CLINICAL DATA:  No bowel movement for 2 weeks. Abdominal pain. EXAM: ABDOMEN - 1 VIEW COMPARISON:  None. FINDINGS: Small to moderate stool in the rectum, small volume of stool elsewhere. No gaseous small bowel dilatation. No evidence of free air. Multiple surgical clips in the pelvis, cholecystectomy clips in the right upper quadrant. Multiple pelvic calcifications, typically phleboliths. No acute  osseous abnormalities. IMPRESSION: Small colonic stool burden, with moderate stool in the rectum. No evidence of obstruction. Electronically Signed   By: Keith Rake M.D.   On: 06/18/2019 23:46   Ct Abdomen Pelvis W Contrast  Result Date: 06/19/2019 CLINICAL DATA:  Abdominal pain. EXAM: CT ABDOMEN AND PELVIS WITH CONTRAST TECHNIQUE: Multidetector CT imaging of the abdomen and pelvis was performed using the standard protocol following bolus administration of intravenous contrast. CONTRAST:  121mL OMNIPAQUE IOHEXOL 300 MG/ML  SOLN COMPARISON:  None. FINDINGS: Lower chest:  The lung bases are clear. The heart size is normal. Hepatobiliary: The liver is normal. Status post cholecystectomy.There is no biliary ductal dilation. Pancreas: Normal contours without ductal dilatation. No peripancreatic fluid collection. Spleen: No splenic laceration or hematoma. Adrenals/Urinary Tract: --Adrenal glands: No adrenal hemorrhage. --Right kidney/ureter: No hydronephrosis or perinephric hematoma. --Left kidney/ureter: No hydronephrosis or perinephric hematoma. --Urinary bladder: There is some wall thickening and fat stranding surrounding the urinary bladder. There are large stones that appear to be centered within the urinary bladder and possibly within the proximal urethra. Stomach/Bowel: --Stomach/Duodenum: No hiatal hernia or other gastric abnormality. Normal duodenal course and caliber. --Small bowel: There is a long segment small bowel, likely the distal jejunum/proximal ileum that demonstrates diffuse wall thickening and adjacent fat stranding. There is no significant dilatation of this segment of small bowel. There is no transition point. There is surrounding free fluid. There are additional loops of small bowel with some mild wall thickening. --Colon: There are few scattered colonic diverticula without CT evidence of diverticulitis. --Appendix: The appendix appears to be surgically absent. Vascular/Lymphatic: There is  no abdominal aortic aneurysm. The SMA and celiac axis are patent. The SMV and portal vein are patent. --No retroperitoneal lymphadenopathy. --No mesenteric lymphadenopathy. --No pelvic or inguinal lymphadenopathy. Reproductive: Status post hysterectomy. No adnexal mass. Other: No ascites or free air. There is a small amount of free fluid in the pelvis and upper abdomen. There is a likely iatrogenic fat containing lumbar hernia on the right. Musculoskeletal. No acute displaced fractures. IMPRESSION: 1. Findings concerning for a moderate to high-grade enteritis involving the distal jejunum/proximal ileum. Ischemic enteritis seems less likely given a relatively normal appearance of the visualized vascular structures. There is no evidence for a high-grade small bowel obstruction. There is no pneumatosis. There is no free air. There is a small volume of free fluid in the abdomen, presumably reactive. 2. Large calcifications in the patient's low pelvis that appear to be scented within the urinary bladder and possibly the proximal urethra. Additionally, there appears to be some wall thickening of the urinary bladder concerning for cystitis. Correlation with urinalysis is recommended. 3. The patient appears to be status post prior appendectomy. Electronically Signed   By: Constance Holster M.D.   On: 06/19/2019 00:43    EKG: Independently reviewed.    Assessment/Plan Principal Problem:   Enteritis of small intestine (HCC) Observation/MedSurg. Continue IV fluids. Milk and molasses enema. Antiemetics as needed. Low-dose analgesics as needed. Consult gastroenterology.  Active Problems:   Constipation Milk and molasses enema ordered.    Abdominal pain, epigastric Analgesics as needed. Antiemetics as needed.    Gastroesophageal reflux disease without esophagitis Continue Protonix 40 mg p.o. daily.   DVT prophylaxis: SCDs. Code Status: Full code. Family Communication: Disposition Plan: Observation  for IV hydration, symptoms treatment, milk of molasses enema and gastroenterology evaluation. Consults called: Routine gastroenterology consult. Admission status: Observation/MedSurg.   Reubin Milan MD [facility]  06/19/2019, 5:07 AM   This document was prepared using Dragon voice recognition software and may contain some unintended transcription errors.

## 2019-06-19 NOTE — ED Notes (Signed)
Patient had moderate results from enema.

## 2019-06-19 NOTE — Consult Note (Signed)
Referring Provider: Irwin Brakeman, MD Primary Care Physician:  Asencion Noble, MD Primary Gastroenterologist:  Dr. Laural Golden  Reason for Consultation:   Abdominal pain/enteritis.  HPI:   Patient is 53 year old Caucasian female who was in usual state of health when she went to sleep 2 days ago.  She woke up yesterday with epigastric and periumbilical pain.  Pain was mild and not associated with any other symptoms.  As the day progressed pain became more intense and became generalized.  Later in the day she had emesis.  She vomited food but no blood.  By evening pain was intractable.  She would have worsening pain when she would try to void or pass flatus.  She therefore was brought to emergency room by her husband.  She was noted to have low-grade temp of 99.3. Her WBC was 12.8.SARS/Cov-2 test was negative.  Abdominal series revealed colonic and rectal stool but no other abnormality noted.  However abdominal pelvic CT revealed very pronounced changes of enteritis involving jejunum and proximal ileum.  There was no pneumoperitoneum.  She had small amount of free fluid between the loops.  She had calcification in pelvis felt to be related to previous bladder surgery.  Both celiac trunk and SMA were wide open without calcifications.  There was no adenopathy. Patient was admitted to the floor.  She was begun on IV fluids and analgesics.  Her lactic acid level was checked and was normal and repeated this afternoon and it is still normal. Patient received milk of molasses enema with excellent results.  Patient states that she had not had a bowel movement for 2 weeks. She was seen by Dr. Irwin Brakeman earlier today and begun on Cipro and metronidazole. Patient continues to complain of pain primarily in periumbilical region and left upper quadrant.  She has steady pain which is described to be 5 but in between her steady pain she has severe pain and she gives a score of 10.  She has been nauseated.  She is  getting very little relief with fentanyl.  Patient states she noted rash over her left leg and she has been using steroid cream which was given to her by company physician.  She has noted at least decrease in itching.  She she is not having any arthralgias.  She states she was diagnosed with rheumatoid arthritis 20 years ago but she has not taken any medications.  She has not lost any weight recently.  She has history of migraines and takes Excedrin no more than couple of times a year.  She says heartburn is well controlled with pantoprazole.  She had EGD by me in December 2019.  No history of melena or rectal bleeding.  She denies chest pain or shortness of breath.  She drinks bottled water.  She has not eaten at a restaurant since onset of COVID-19 pandemic. Patient states he has had neuropathic pain since she had complications associated with placement of bladder sling which was subsequently removed.  She is on gabapentin. She has never smoked cigarettes and does not drink alcohol. She has a history of colonic adenomas.  She had 4 adenomas removed on colonoscopy of May 2014 and none were identified on colonoscopy of September 2016.  Patient is married.  She has 2 sons ages 3 and 6.  Her older son has nonalcoholic cirrhosis.  She does not know any other details.  Her other son is in good health.  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  Previously she used to work at [employer].  As above she has never smoked cigarettes and does not drink alcohol. Her father has coronary artery disease and also was treated for AML and is now in remission.  He is 5 years old.  Her mother was diagnosed with lung carcinoma at age 20 and died before she turned 24.  She has 1 sister in good health.   Past Medical History:  Diagnosis Date  . Asthma    triggered by weather changes; no inhaler  . Cancer ([place])    Cervical  . Collagen vascular disease ([place])   . Fibromyalgia   . Ganglion cyst of wrist  12/2012   left dorsal cyst  . GERD (gastroesophageal reflux disease)    TUMS as needed  . Headache(784.0)    tension/migraines  . PONV (postoperative nausea and vomiting)    also states stopped breathing after hernia surgery  . Rectal bleeding   . Rheumatoid arthritis ([place])   . Stenosing tenosynovitis of finger 12/2012   left middle finger  . 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 [facility])     Past Surgical History:  Procedure Laterality Date  . ABDOMINAL HYSTERECTOMY in 1994.  1994  . [place]  . BIOPSY  12/01/2018   Procedure: BIOPSY;  Surgeon: Rogene Houston, MD;  [facility];  Service: Endoscopy;;  gastric   . COLONOSCOPY N/A 05/08/2013   Procedure: COLONOSCOPY;  Surgeon: Rogene Houston, MD;  [facility];  Service: Endoscopy;  Laterality: N/A;  240  . COLONOSCOPY N/A 09/23/2015   Procedure: COLONOSCOPY;  Surgeon: Rogene Houston, MD;  [facility];  Service: Endoscopy;  Laterality: N/A;  825  . CYSTOSCOPY WITH HYDRODISTENSION AND BIOPSY  04/01/2007  . [place]   left tube and ovary removed  . ESOPHAGOGASTRODUODENOSCOPY N/A 12/01/2018   Procedure: ESOPHAGOGASTRODUODENOSCOPY (EGD);  Surgeon: Rogene Houston, MD;  [facility];  Service: Endoscopy;  Laterality: N/A;  12:45  . GANGLION CYST EXCISION  12/17/2012   Procedure: REMOVAL GANGLION OF WRIST;  Surgeon: Wynonia Sours, MD;  Location: [place];  Service: Orthopedics;  Laterality: Left;  . HARDWARE REMOVAL Right 08/09/2014   Procedure: Placement Right Ankle Tight Rope, Removal Plate and Screws;  Surgeon: Marybelle Killings, MD;  Location: [place];  Service: Orthopedics;  Laterality: Right;  . PUBOVAGINAL SLING  2010  . rt ankle surgery    . SALPINGOOPHORECTOMY  1996   right  . TRIGGER FINGER RELEASE  12/17/2012   Procedure: RELEASE TRIGGER FINGER/A-1 PULLEY;  Surgeon: Wynonia Sours, MD;  Location: [place];  Service:  Orthopedics;  Laterality: Left;  . TUBAL LIGATION  1992  . VAGINA RECONSTRUCTION SURGERY     x 5 since 2010  . VENTRAL HERNIA REPAIR  09/06/2006    Prior to Admission medications   Medication Sig Start Date End Date Taking? Authorizing Provider  aspirin-acetaminophen-caffeine (EXCEDRIN MIGRAINE) [phone] MG tablet Take 2 tablets by mouth every 6 (six) hours as needed for headache or migraine.    Yes [provider]  Cholecalciferol (VITAMIN D3) 125 MCG (5000 UT) CAPS Take 5,000 Units by mouth daily.    Yes [provider]  escitalopram (LEXAPRO) 10 MG tablet Take 10 mg by mouth daily.   Yes [provider]  estradiol (ESTRACE) 2 MG tablet TAKE 1 TABLET BY MOUTH EVERY DAY Patient taking differently: Take 2 mg  by mouth daily.  03/24/19  Yes Roma Schanz, CNM  gabapentin (NEURONTIN) 400 MG capsule Take 1 capsule (400 mg total) by mouth at bedtime. 12/25/16  Yes Setzer, Terri L, NP  lisinopril (ZESTRIL) 5 MG tablet Take 1 tablet (5 mg total) by mouth daily. 06/09/19 09/07/19 Yes Herminio Commons, MD  pantoprazole (PROTONIX) 40 MG tablet Take 40 mg by mouth daily.   Yes [provider]  augmented betamethasone dipropionate (DIPROLENE-AF) 0.05 % cream Apply 1 application topically at bedtime.  09/23/18   [provider]    Current Facility-Administered Medications  Medication Dose Route Frequency Provider Last Rate Last Dose  . 0.9 %  sodium chloride infusion   Intravenous Continuous Wynetta Emery, Clanford L, MD 125 mL/hr at 06/19/19 1252    . ciprofloxacin (CIPRO) IVPB 400 mg  400 mg Intravenous Q12H Johnson, Clanford L, MD 200 mL/hr at 06/19/19 1253 400 mg at 06/19/19 1253  . estradiol (ESTRACE) tablet 2 mg  2 mg Oral Daily Reubin Milan, MD   2 mg at 06/19/19 0908  . gabapentin (NEURONTIN) capsule 400 mg  400 mg Oral QHS Reubin Milan, MD      . HYDROmorphone (DILAUDID) injection 1 mg  1 mg Intravenous Q3H PRN Meriam Chojnowski U, MD       . HYDROmorphone (DILAUDID) injection 1 mg  1 mg Intravenous STAT Zhamir Pirro U, MD      . lisinopril (ZESTRIL) tablet 5 mg  5 mg Oral Daily Reubin Milan, MD      . metroNIDAZOLE (FLAGYL) IVPB 500 mg  500 mg Intravenous Q8H Johnson, Clanford L, MD      . ondansetron (ZOFRAN) tablet 4 mg  4 mg Oral Q6H PRN Reubin Milan, MD   4 mg at 06/19/19 0911   Or  . ondansetron [facility]) injection 4 mg  4 mg Intravenous Q6H PRN Reubin Milan, MD      . pantoprazole (PROTONIX) EC tablet 40 mg  40 mg Oral Daily Reubin Milan, MD   40 mg at 06/19/19 0908  . polyethylene glycol (MIRALAX / GLYCOLAX) packet 17 g  17 g Oral BID Wynetta Emery, Clanford L, MD   17 g at 06/19/19 1256    Allergies as of 06/18/2019 - Review Complete 06/18/2019  Allergen Reaction Noted  . Imitrex [sumatriptan]  04/29/2013  . Cymbalta [duloxetine hcl] Rash 04/29/2013  . Latex Rash 12/11/2012  . Propoxyphene n-acetaminophen Nausea And Vomiting and Rash   . Tramadol Rash 08/05/2014    Family History  Problem Relation Age of Onset  . Lung cancer Mother   . Cervical cancer Mother   . Heart disease Father   . Leukemia Father   . Cancer Maternal Grandmother        bladder and colon cancer    Social History   Socioeconomic History  . Marital status: Married    Spouse name: Not on file  . Number of children: Not on file  . Years of education: Not on file  . Highest education level: Not on file  Occupational History  . Not on file  Social Needs  . Financial resource strain: Not on file  . Food insecurity    Worry: Not on file    Inability: Not on file  . Transportation needs    Medical: Not on file    Non-medical: Not on file  Tobacco Use  . Smoking status: Never Smoker  . Smokeless tobacco: Never Used  Substance and  Sexual Activity  . Alcohol use: No  . Drug use: No  . Sexual activity: Yes    Birth control/protection: Surgical    Comment: hyst  Lifestyle  . Physical activity    Days per  week: Not on file    Minutes per session: Not on file  . Stress: Not on file  Relationships  . Social Herbalist on phone: Not on file    Gets together: Not on file    Attends religious service: Not on file    Active member of club or organization: Not on file    Attends meetings of clubs or organizations: Not on file    Relationship status: Not on file  . Intimate partner violence    Fear of current or ex partner: Not on file    Emotionally abused: Not on file    Physically abused: Not on file    Forced sexual activity: Not on file  Other Topics Concern  . Not on file  Social History Narrative  . Not on file    Review of Systems: See HPI, otherwise normal ROS  Physical Exam: Temp:  [98.4 F (36.9 C)-99.3 F (37.4 C)] 98.4 F (36.9 C) (07/10 0750) Pulse Rate:  [64-100] 64 (07/10 0750) Resp:  [18-20] 20 (07/10 0750) BP: (101-118)/(59-76) 110/65 (07/10 0908) SpO2:  [94 %-98 %] 98 % (07/10 0750) Weight:  [79.4 kg] 79.4 kg (07/09 2304) Last BM Date: 06/19/19  Patient is alert and appears to be in distress.  Every few minutes she is grabbing her abdomen and appears to be in pain. Conjunctivae is pale.  Sclerae nonicteric. Oropharyngeal mucosa is normal. Neck without thyromegaly lymphadenopathy or masses. Cardiac exam with regular rhythm normal S1 and S2.  No murmur or gallop noted. Auscultation of lungs reveal vesicular breath sounds bilaterally.  No rales or rhonchi noted. Abdomen is full.  She has laparoscopy scars across upper abdomen.  She has appendectomy scar in right lower quadrant and she also has scarring left lower quadrant.  Bowel sounds are normal.  She does not have abdominal bruit.  Abdomen is soft.  She has moderate tenderness in periumbilical region as well as left upper quadrant with rebound.  No organomegaly or masses. She does not have peripheral edema or clubbing.  She has maculopapular rash involving outer aspect of right leg above lateral  malleolus.   Lab Results: Recent Labs    06/18/19 2338 06/19/19 0515 06/19/19 0810  WBC 12.8* 10.6* 11.2*  HGB 12.7 11.4* 11.3*  HCT 38.7 34.5* 34.9*  PLT 223 203 213   BMET Recent Labs    06/18/19 2338 06/19/19 0810  NA 137 136  K 4.1 4.0  CL 103 107  CO2 25 22  GLUCOSE 111* 106*  BUN 19 12  CREATININE 0.84 0.75  CALCIUM 8.8* 7.9*   LFT Recent Labs    06/18/19 2338  PROT 7.0  ALBUMIN 3.8  AST 16  ALT 13  ALKPHOS 55  BILITOT 0.5    Studies/Results: Dg Abdomen 1 View  Result Date: 06/18/2019 CLINICAL DATA:  No bowel movement for 2 weeks. Abdominal pain. EXAM: ABDOMEN - 1 VIEW COMPARISON:  None. FINDINGS: Small to moderate stool in the rectum, small volume of stool elsewhere. No gaseous small bowel dilatation. No evidence of free air. Multiple surgical clips in the pelvis, cholecystectomy clips in the right upper quadrant. Multiple pelvic calcifications, typically phleboliths. No acute osseous abnormalities. IMPRESSION: Small colonic stool burden, with moderate stool  in the rectum. No evidence of obstruction. Electronically Signed   By: Keith Rake M.D.   On: 06/18/2019 23:46   Ct Abdomen Pelvis W Contrast  Result Date: 06/19/2019 CLINICAL DATA:  Abdominal pain. EXAM: CT ABDOMEN AND PELVIS WITH CONTRAST TECHNIQUE: Multidetector CT imaging of the abdomen and pelvis was performed using the standard protocol following bolus administration of intravenous contrast. CONTRAST:  165mL OMNIPAQUE IOHEXOL 300 MG/ML  SOLN COMPARISON:  None. FINDINGS: Lower chest: The lung bases are clear. The heart size is normal. Hepatobiliary: The liver is normal. Status post cholecystectomy.There is no biliary ductal dilation. Pancreas: Normal contours without ductal dilatation. No peripancreatic fluid collection. Spleen: No splenic laceration or hematoma. Adrenals/Urinary Tract: --Adrenal glands: No adrenal hemorrhage. --Right kidney/ureter: No hydronephrosis or perinephric hematoma. --Left  kidney/ureter: No hydronephrosis or perinephric hematoma. --Urinary bladder: There is some wall thickening and fat stranding surrounding the urinary bladder. There are large stones that appear to be centered within the urinary bladder and possibly within the proximal urethra. Stomach/Bowel: --Stomach/Duodenum: No hiatal hernia or other gastric abnormality. Normal duodenal course and caliber. --Small bowel: There is a long segment small bowel, likely the distal jejunum/proximal ileum that demonstrates diffuse wall thickening and adjacent fat stranding. There is no significant dilatation of this segment of small bowel. There is no transition point. There is surrounding free fluid. There are additional loops of small bowel with some mild wall thickening. --Colon: There are few scattered colonic diverticula without CT evidence of diverticulitis. --Appendix: The appendix appears to be surgically absent. Vascular/Lymphatic: There is no abdominal aortic aneurysm. The SMA and celiac axis are patent. The SMV and portal vein are patent. --No retroperitoneal lymphadenopathy. --No mesenteric lymphadenopathy. --No pelvic or inguinal lymphadenopathy. Reproductive: Status post hysterectomy. No adnexal mass. Other: No ascites or free air. There is a small amount of free fluid in the pelvis and upper abdomen. There is a likely iatrogenic fat containing lumbar hernia on the right. Musculoskeletal. No acute displaced fractures. IMPRESSION: 1. Findings concerning for a moderate to high-grade enteritis involving the distal jejunum/proximal ileum. Ischemic enteritis seems less likely given a relatively normal appearance of the visualized vascular structures. There is no evidence for a high-grade small bowel obstruction. There is no pneumatosis. There is no free air. There is a small volume of free fluid in the abdomen, presumably reactive. 2. Large calcifications in the patient's low pelvis that appear to be scented within the urinary  bladder and possibly the proximal urethra. Additionally, there appears to be some wall thickening of the urinary bladder concerning for cystitis. Correlation with urinalysis is recommended. 3. The patient appears to be status post prior appendectomy. Electronically Signed   By: Constance Holster M.D.   On: 06/19/2019 00:43   Imaging studies reviewed with Dr. Thornton Papas earlier today.  Assessment;  Patient is 53 year old Caucasian female who presents with acute onset of abdominal pain associated with nausea and vomiting.  CT reveals very pronounced changes of enteritis involving long segment of proximal and mid jejunum and as well as segment of proximal ileum.  She does not have adenopathy.  She does not have atherosclerotic changes to the aorta and both celiac trunk and SMA are wide open.  Patient is non-smoker.  Differential diagnosis include enteritis secondary to infection or autoimmune process.  Small bowel Crohn's disease can present as an acute illness.  Ischemic injury is less likely given no underlying risk factors such as hyperlipidemia or cigarette smoking.  Similarly doubt small bowel angioedema given her  presentation. I agree with empiric therapy with IV antibiotics while work-up is underway to pinpoint etiology.  Recommendations;  Discontinue ketorolac and fentanyl. Hydromorphone 1 mg IV now and then every 3 hours as needed.  Pain management discussed with Dr. Wynetta Emery he agrees. GI pathogen panel.   Sed rate, ANA, CRP, IgG IgA and IgM levels with a.m. labs. CBC with differential will be repeated in a.m.   LOS: 0 days   Dhani Imel  06/19/2019, 2:15 PM

## 2019-06-19 NOTE — Progress Notes (Signed)
ASSUMPTION OF CARE NOTE    06/19/2019 11:10 AM  [name] was seen and examined.  The H&P by the admitting provider, orders, imaging was reviewed.  Please see new orders.  Will continue to follow.   Vitals:   06/19/19 0750 06/19/19 0908  BP: (!) 107/59 110/65  Pulse: 64   Resp: 20   Temp: 98.4 F (36.9 C)   SpO2: 98%     Results for orders placed or performed during the hospital encounter of 06/18/19  SARS Coronavirus 2 (CEPHEID - Performed in [place] hospital lab), [facility] Order   Specimen: Nasopharyngeal Swab  Result Value Ref Range   SARS Coronavirus 2 NEGATIVE NEGATIVE  CBC with Differential  Result Value Ref Range   WBC 12.8 (H) 4.0 - 10.5 K/uL   RBC 3.89 3.87 - 5.11 MIL/uL   Hemoglobin 12.7 12.0 - 15.0 g/dL   HCT 38.7 36.0 - 46.0 %   MCV 99.5 80.0 - 100.0 fL   MCH 32.6 26.0 - 34.0 pg   MCHC 32.8 30.0 - 36.0 g/dL   RDW 13.1 11.5 - 15.5 %   Platelets 223 150 - 400 K/uL   nRBC 0.0 0.0 - 0.2 %   Neutrophils Relative % 66 %   Neutro Abs 8.3 (H) 1.7 - 7.7 K/uL   Lymphocytes Relative 26 %   Lymphs Abs 3.4 0.7 - 4.0 K/uL   Monocytes Relative 7 %   Monocytes Absolute 0.9 0.1 - 1.0 K/uL   Eosinophils Relative 1 %   Eosinophils Absolute 0.2 0.0 - 0.5 K/uL   Basophils Relative 0 %   Basophils Absolute 0.1 0.0 - 0.1 K/uL   Immature Granulocytes 0 %   Abs Immature Granulocytes 0.05 0.00 - 0.07 K/uL  Lipase, blood  Result Value Ref Range   Lipase 27 11 - 51 U/L  Comprehensive metabolic panel  Result Value Ref Range   Sodium 137 135 - 145 mmol/L   Potassium 4.1 3.5 - 5.1 mmol/L   Chloride 103 98 - 111 mmol/L   CO2 25 22 - 32 mmol/L   Glucose, Bld 111 (H) 70 - 99 mg/dL   BUN 19 6 - 20 mg/dL   Creatinine, Ser 0.84 0.44 - 1.00 mg/dL   Calcium 8.8 (L) 8.9 - 10.3 mg/dL   Total Protein 7.0 6.5 - 8.1 g/dL   Albumin 3.8 3.5 - 5.0 g/dL   AST 16 15 - 41 U/L   ALT 13 0 - 44 U/L   Alkaline Phosphatase 55 38 - 126 U/L   Total Bilirubin 0.5 0.3 - 1.2 mg/dL   GFR calc non  Af Amer >60 >60 mL/min   GFR calc Af Amer >60 >60 mL/min   Anion gap 9 5 - 15  Urinalysis, Routine w reflex microscopic  Result Value Ref Range   Color, Urine YELLOW YELLOW   APPearance CLEAR CLEAR   Specific Gravity, Urine >1.046 (H) 1.005 - 1.030   pH 6.0 5.0 - 8.0   Glucose, UA NEGATIVE NEGATIVE mg/dL   Hgb urine dipstick NEGATIVE NEGATIVE   Bilirubin Urine NEGATIVE NEGATIVE   Ketones, ur NEGATIVE NEGATIVE mg/dL   Protein, ur NEGATIVE NEGATIVE mg/dL   Nitrite NEGATIVE NEGATIVE   Leukocytes,Ua NEGATIVE NEGATIVE  Magnesium  Result Value Ref Range   Magnesium 1.7 1.7 - 2.4 mg/dL  Phosphorus  Result Value Ref Range   Phosphorus 2.6 2.5 - 4.6 mg/dL  CBC WITH DIFFERENTIAL  Result Value Ref Range   WBC 10.6 (H)  4.0 - 10.5 K/uL   RBC 3.44 (L) 3.87 - 5.11 MIL/uL   Hemoglobin 11.4 (L) 12.0 - 15.0 g/dL   HCT 34.5 (L) 36.0 - 46.0 %   MCV 100.3 (H) 80.0 - 100.0 fL   MCH 33.1 26.0 - 34.0 pg   MCHC 33.0 30.0 - 36.0 g/dL   RDW 12.9 11.5 - 15.5 %   Platelets 203 150 - 400 K/uL   nRBC 0.0 0.0 - 0.2 %   Neutrophils Relative % 77 %   Neutro Abs 8.2 (H) 1.7 - 7.7 K/uL   Lymphocytes Relative 18 %   Lymphs Abs 1.9 0.7 - 4.0 K/uL   Monocytes Relative 5 %   Monocytes Absolute 0.5 0.1 - 1.0 K/uL   Eosinophils Relative 0 %   Eosinophils Absolute 0.0 0.0 - 0.5 K/uL   Basophils Relative 0 %   Basophils Absolute 0.0 0.0 - 0.1 K/uL   Immature Granulocytes 0 %   Abs Immature Granulocytes 0.04 0.00 - 0.07 K/uL  Basic metabolic panel  Result Value Ref Range   Sodium 136 135 - 145 mmol/L   Potassium 4.0 3.5 - 5.1 mmol/L   Chloride 107 98 - 111 mmol/L   CO2 22 22 - 32 mmol/L   Glucose, Bld 106 (H) 70 - 99 mg/dL   BUN 12 6 - 20 mg/dL   Creatinine, Ser 0.75 0.44 - 1.00 mg/dL   Calcium 7.9 (L) 8.9 - 10.3 mg/dL   GFR calc non Af Amer >60 >60 mL/min   GFR calc Af Amer >60 >60 mL/min   Anion gap 7 5 - 15  CBC with Differential/Platelet  Result Value Ref Range   WBC 11.2 (H) 4.0 - 10.5 K/uL    RBC 3.49 (L) 3.87 - 5.11 MIL/uL   Hemoglobin 11.3 (L) 12.0 - 15.0 g/dL   HCT 34.9 (L) 36.0 - 46.0 %   MCV 100.0 80.0 - 100.0 fL   MCH 32.4 26.0 - 34.0 pg   MCHC 32.4 30.0 - 36.0 g/dL   RDW 13.0 11.5 - 15.5 %   Platelets 213 150 - 400 K/uL   nRBC 0.0 0.0 - 0.2 %   Neutrophils Relative % 70 %   Neutro Abs 7.8 (H) 1.7 - 7.7 K/uL   Lymphocytes Relative 24 %   Lymphs Abs 2.7 0.7 - 4.0 K/uL   Monocytes Relative 6 %   Monocytes Absolute 0.7 0.1 - 1.0 K/uL   Eosinophils Relative 0 %   Eosinophils Absolute 0.0 0.0 - 0.5 K/uL   Basophils Relative 0 %   Basophils Absolute 0.0 0.0 - 0.1 K/uL   Immature Granulocytes 0 %   Abs Immature Granulocytes 0.05 0.00 - 0.07 K/uL   C. Wynetta Emery, MD [facility]   06/18/2019 10:58 PM How to contact the [facility] Attending or Consulting provider [place] or covering provider during after hours [place], for this patient?  1. Check the care team in [facility] and look for a) attending/consulting [facility] provider listed and b) the [facility] team listed 2. Log into [email or web address] and use [place] universal password to access. If you do not have the password, please contact the hospital operator. 3. Locate the [facility] provider you are looking for under [facility] and page to a number that you can be directly reached. 4. If you still have difficulty reaching the provider, please page the [facility] (Director on Call) for the Hospitalists listed on amion for assistance.

## 2019-06-19 NOTE — Progress Notes (Signed)
Patient received pain medication with Dr. Laural Golden  Present to observe comfort level. Patients breathing increased and made throat noises on inspiration. Lungs clear and patient was walked through deep breathing and relaxation techniques. Lungs assessed by Dr. Lucianne Muss stated it was due to throat upper airway. With calming techniques in practice the patient no longer made upper airway noises.  Elodia Florence RN 06/19/2019 1500

## 2019-06-19 NOTE — Progress Notes (Signed)
Patient states she vomited once. She feels much more comfortable less abdominal pain is decreased significantly. Pain is now mild. Lactic acid level is normal.  Suspect vomiting due to underlying condition rather than narcotic. However narcotic dose will be reduced.

## 2019-06-19 NOTE — ED Provider Notes (Signed)
[facility] EMERGENCY DEPARTMENT Provider Note   CSN: [id number] Arrival date & time: 06/18/19  2257  Time seen 11:25 PM  History   Chief Complaint Chief Complaint  Patient presents with  . Abdominal Pain    HPI [name] is a 53 y.o. female.     HPI patient states her normal bowel habits is that she has a bowel movement about once a week.  She however relates her last bowel movement was 2 weeks ago.  She does not relate it to anything different such as a change in her diet.  She states her only new medication is lisinopril that was added.  She states this morning after she got up she started having diffuse abdominal pain however it is very severe and her suprapubic area.  She states it is constant and stabbing.  She states when she pees or passes gas it makes the pain worse.  Passing gas does not relieve the pain.  She denies any burning fluid or reflux symptoms.  She denies any radiation of the pain into her back.  She has had nausea and vomited once today after eating grapes thinking that would help her have a BM.  She denies diarrhea or fever.  She states laying still and laying on her side makes it feel a little better, nothing makes it hurt more.  She states she has had several surgeries including a hysterectomy, cholecystectomy, and appendectomy.  She denies feeling like her abdomen is bloated.  She has not taken any laxatives.  She states she is never had this pain before.  PCP Asencion Noble, MD   Past Medical History:  Diagnosis Date  . Asthma    triggered by weather changes; no inhaler  . Cancer ([place])    Cervical  . Collagen vascular disease ([place])   . Fibromyalgia   . Ganglion cyst of wrist 12/2012   left dorsal cyst  . GERD (gastroesophageal reflux disease)    TUMS as needed  . Headache(784.0)    tension/migraines  . PONV (postoperative nausea and vomiting)    also states stopped breathing after hernia surgery  . Rectal bleeding   . Rheumatoid arthritis ([place])   .  Stenosing tenosynovitis of finger 12/2012   left middle finger  . 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 Gastroesophageal reflux disease without esophagitis 10/23/2018  . Abdominal pain, epigastric 10/23/2018  . Chest pain 10/16/2018  . Stiffness of joint, not elsewhere classified, ankle and foot 09/07/2014  . Pain in joint, ankle and foot 09/07/2014  . Difficulty in walking(719.7) 09/07/2014  . 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 Hematochezia 08/11/2013  . Constipation 08/11/2013  . RHEUMATOID ARTHRITIS 02/20/2010  . KNEE PAIN 02/20/2010  . FIBROMYALGIA 02/20/2010    Past Surgical History:  Procedure Laterality Date  . ABDOMINAL HYSTERECTOMY  1994  . APPENDECTOMY  1996  . BIOPSY  12/01/2018   Procedure: BIOPSY;  Surgeon: Rogene Houston, MD;  [facility];  Service: Endoscopy;;  gastric   . COLONOSCOPY N/A 05/08/2013   Procedure: COLONOSCOPY;  Surgeon: Rogene Houston, MD;  Location: [facility];  Service: Endoscopy;  Laterality: N/A;  240  . COLONOSCOPY N/A 09/23/2015   Procedure: COLONOSCOPY;  Surgeon: Rogene Houston, MD;  Location: [facility];  Service: Endoscopy;  Laterality: N/A;  825  . CYSTOSCOPY WITH HYDRODISTENSION AND BIOPSY  04/01/2007  . ECTOPIC  PREGNANCY SURGERY  1995   left tube and ovary removed  . ESOPHAGOGASTRODUODENOSCOPY N/A 12/01/2018   Procedure: ESOPHAGOGASTRODUODENOSCOPY (EGD);  Surgeon: Rogene Houston, MD;  Location[facility];  Service: Endoscopy;  Laterality: N/A;  12:45  . GANGLION CYST EXCISION  12/17/2012   Procedure: REMOVAL GANGLION OF WRIST;  Surgeon: Wynonia Sours, MD;  Location: [place];  Service: Orthopedics;  Laterality: Left;  . HARDWARE REMOVAL Right 08/09/2014   Procedure: Placement Right Ankle Tight Rope, Removal Plate and Screws;  Surgeon: Marybelle Killings, MD;  Location: [place];  Service: Orthopedics;  Laterality: Right;  . PUBOVAGINAL SLING   2010  . rt ankle surgery    . SALPINGOOPHORECTOMY  1996   right  . TRIGGER FINGER RELEASE  12/17/2012   Procedure: RELEASE TRIGGER FINGER/A-1 PULLEY;  Surgeon: Wynonia Sours, MD;  Location: [place];  Service: Orthopedics;  Laterality: Left;  . TUBAL LIGATION  1992  . VAGINA RECONSTRUCTION SURGERY     x 5 since 2010  . VENTRAL HERNIA REPAIR  09/06/2006     OB History    Gravida  3   Para  2   Term  2   Preterm  0   AB  1   Living  2     SAB      TAB      Ectopic  1   Multiple      Live Births  2            Home Medications    Prior to Admission medications   Medication Sig Start Date End Date Taking? Authorizing Provider  amLODipine (NORVASC) 5 MG tablet Take 1 tablet (5 mg total) by mouth daily. 05/18/19 08/16/19  Herminio Commons, MD  aspirin-acetaminophen-caffeine (EXCEDRIN MIGRAINE) [phone] MG tablet Take 2 tablets by mouth every 6 (six) hours as needed for headache or migraine.     [provider]  augmented betamethasone dipropionate (DIPROLENE-AF) 0.05 % cream Apply 1 application topically at bedtime.  09/23/18   [provider]  Cholecalciferol (VITAMIN D3) 125 MCG (5000 UT) CAPS Take 5,000 Units by mouth daily.     [provider]  escitalopram (LEXAPRO) 5 MG tablet Take 5 mg by mouth daily.    [provider]  estradiol (ESTRACE) 2 MG tablet TAKE 1 TABLET BY MOUTH EVERY DAY Patient taking differently: Take 2 mg by mouth daily.  03/24/19   Roma Schanz, CNM  gabapentin (NEURONTIN) 400 MG capsule Take 1 capsule (400 mg total) by mouth at bedtime. 12/25/16   Setzer, Rona Ravens, NP  lisinopril (ZESTRIL) 5 MG tablet Take 1 tablet (5 mg total) by mouth daily. 06/09/19 09/07/19  Herminio Commons, MD  pantoprazole (PROTONIX) 40 MG tablet Take 40 mg by mouth daily.    [provider]    Family History Family History  Problem Relation Age of Onset  . Lung cancer Mother   . Cervical cancer  Mother   . Heart disease Father   . Leukemia Father   . Cancer Maternal Grandmother        bladder and colon cancer    Social History Social History   Tobacco Use  . Smoking status: Never Smoker  . Smokeless tobacco: Never Used  Substance Use Topics  . Alcohol use: No  . Drug use: No     Allergies   Imitrex [sumatriptan], Cymbalta [duloxetine hcl], Latex, Propoxyphene n-acetaminophen, and Tramadol   Review  of Systems Review of Systems  All other systems reviewed and are negative.    Physical Exam Updated Vital Signs BP 106/71 (BP Location: Right Arm)   Pulse 81   Temp 99.3 F (37.4 C) (Oral)   Resp 18   Ht 5\' 3"  (1.6 m)   Wt 79.4 kg   SpO2 95%   BMI 31.00 kg/m   Vital signs normal    Physical Exam Vitals signs and nursing note reviewed.  Constitutional:      General: She is in acute distress.     Appearance: Normal appearance. She is well-developed. She is not ill-appearing or toxic-appearing.  HENT:     Head: Normocephalic and atraumatic.     Right Ear: External ear normal.     Left Ear: External ear normal.     Nose: Nose normal. No mucosal edema or rhinorrhea.     Mouth/Throat:     Mouth: Mucous membranes are dry.     Dentition: No dental abscesses.     Pharynx: No uvula swelling.  Eyes:     Extraocular Movements: Extraocular movements intact.     Conjunctiva/sclera: Conjunctivae normal.     Pupils: Pupils are equal, round, and reactive to light.  Neck:     Musculoskeletal: Full passive range of motion without pain, normal range of motion and neck supple.  Cardiovascular:     Rate and Rhythm: Normal rate and regular rhythm.     Heart sounds: Normal heart sounds. No murmur. No friction rub. No gallop.   Pulmonary:     Effort: Pulmonary effort is normal. No respiratory distress.     Breath sounds: Normal breath sounds. No wheezing, rhonchi or rales.  Chest:     Chest wall: No tenderness or crepitus.  Abdominal:     General: Bowel sounds are  increased. There is no distension.     Palpations: Abdomen is soft.     Tenderness: There is abdominal tenderness in the epigastric area. There is no guarding or rebound.  Musculoskeletal: Normal range of motion.        General: No tenderness.     Comments: Moves all extremities well.   Skin:    General: Skin is warm and dry.     Capillary Refill: Capillary refill takes less than 2 seconds.     Coloration: Skin is not pale.     Findings: No erythema or rash.  Neurological:     General: No focal deficit present.     Mental Status: She is alert and oriented to person, place, and time.     Cranial Nerves: No cranial nerve deficit.  Psychiatric:        Mood and Affect: Mood is not anxious.        Speech: Speech normal.        Behavior: Behavior normal.        Thought Content: Thought content normal.      ED Treatments / Results  Labs (all labs ordered are listed, but only abnormal results are displayed) Results for orders placed or performed during the hospital encounter of 06/18/19  CBC with Differential  Result Value Ref Range   WBC 12.8 (H) 4.0 - 10.5 K/uL   RBC 3.89 3.87 - 5.11 MIL/uL   Hemoglobin 12.7 12.0 - 15.0 g/dL   HCT 38.7 36.0 - 46.0 %   MCV 99.5 80.0 - 100.0 fL   MCH 32.6 26.0 - 34.0 pg   MCHC 32.8 30.0 - 36.0 g/dL  RDW 13.1 11.5 - 15.5 %   Platelets 223 150 - 400 K/uL   nRBC 0.0 0.0 - 0.2 %   Neutrophils Relative % 66 %   Neutro Abs 8.3 (H) 1.7 - 7.7 K/uL   Lymphocytes Relative 26 %   Lymphs Abs 3.4 0.7 - 4.0 K/uL   Monocytes Relative 7 %   Monocytes Absolute 0.9 0.1 - 1.0 K/uL   Eosinophils Relative 1 %   Eosinophils Absolute 0.2 0.0 - 0.5 K/uL   Basophils Relative 0 %   Basophils Absolute 0.1 0.0 - 0.1 K/uL   Immature Granulocytes 0 %   Abs Immature Granulocytes 0.05 0.00 - 0.07 K/uL  Lipase, blood  Result Value Ref Range   Lipase 27 11 - 51 U/L  Comprehensive metabolic panel  Result Value Ref Range   Sodium 137 135 - 145 mmol/L   Potassium 4.1  3.5 - 5.1 mmol/L   Chloride 103 98 - 111 mmol/L   CO2 25 22 - 32 mmol/L   Glucose, Bld 111 (H) 70 - 99 mg/dL   BUN 19 6 - 20 mg/dL   Creatinine, Ser 0.84 0.44 - 1.00 mg/dL   Calcium 8.8 (L) 8.9 - 10.3 mg/dL   Total Protein 7.0 6.5 - 8.1 g/dL   Albumin 3.8 3.5 - 5.0 g/dL   AST 16 15 - 41 U/L   ALT 13 0 - 44 U/L   Alkaline Phosphatase 55 38 - 126 U/L   Total Bilirubin 0.5 0.3 - 1.2 mg/dL   GFR calc non Af Amer >60 >60 mL/min   GFR calc Af Amer >60 >60 mL/min   Anion gap 9 5 - 15  Urinalysis, Routine w reflex microscopic  Result Value Ref Range   Color, Urine YELLOW YELLOW   APPearance CLEAR CLEAR   Specific Gravity, Urine >1.046 (H) 1.005 - 1.030   pH 6.0 5.0 - 8.0   Glucose, UA NEGATIVE NEGATIVE mg/dL   Hgb urine dipstick NEGATIVE NEGATIVE   Bilirubin Urine NEGATIVE NEGATIVE   Ketones, ur NEGATIVE NEGATIVE mg/dL   Protein, ur NEGATIVE NEGATIVE mg/dL   Nitrite NEGATIVE NEGATIVE   Leukocytes,Ua NEGATIVE NEGATIVE   Laboratory interpretation all normal except mild leukocytosis, concentrated UA c/w dehydration    EKG None  Radiology Dg Abdomen 1 View  Result Date: 06/18/2019 CLINICAL DATA:  No bowel movement for 2 weeks. Abdominal pain. EXAM: ABDOMEN - 1 VIEW COMPARISON:  None. FINDINGS: Small to moderate stool in the rectum, small volume of stool elsewhere. No gaseous small bowel dilatation. No evidence of free air. Multiple surgical clips in the pelvis, cholecystectomy clips in the right upper quadrant. Multiple pelvic calcifications, typically phleboliths. No acute osseous abnormalities. IMPRESSION: Small colonic stool burden, with moderate stool in the rectum. No evidence of obstruction. Electronically Signed   By: Keith Rake M.D.   On: 06/18/2019 23:46   Ct Abdomen Pelvis W Contrast  Result Date: 06/19/2019 CLINICAL DATA:  Abdominal pain. EXAM: CT ABDOMEN AND PELVIS WITH CONTRAST TECHNIQUE: Multidetector CT imaging of the abdomen and pelvis was performed using the  standard protocol following bolus administration of intravenous contrast. CONTRAST:  1104mL OMNIPAQUE IOHEXOL 300 MG/ML  SOLN COMPARISON:  None. FINDINGS: Lower chest: The lung bases are clear. The heart size is normal. Hepatobiliary: The liver is normal. Status post cholecystectomy.There is no biliary ductal dilation. Pancreas: Normal contours without ductal dilatation. No peripancreatic fluid collection. Spleen: No splenic laceration or hematoma. Adrenals/Urinary Tract: --Adrenal glands: No adrenal hemorrhage. --Right kidney/ureter: No  hydronephrosis or perinephric hematoma. --Left kidney/ureter: No hydronephrosis or perinephric hematoma. --Urinary bladder: There is some wall thickening and fat stranding surrounding the urinary bladder. There are large stones that appear to be centered within the urinary bladder and possibly within the proximal urethra. Stomach/Bowel: --Stomach/Duodenum: No hiatal hernia or other gastric abnormality. Normal duodenal course and caliber. --Small bowel: There is a long segment small bowel, likely the distal jejunum/proximal ileum that demonstrates diffuse wall thickening and adjacent fat stranding. There is no significant dilatation of this segment of small bowel. There is no transition point. There is surrounding free fluid. There are additional loops of small bowel with some mild wall thickening. --Colon: There are few scattered colonic diverticula without CT evidence of diverticulitis. --Appendix: The appendix appears to be surgically absent. Vascular/Lymphatic: There is no abdominal aortic aneurysm. The SMA and celiac axis are patent. The SMV and portal vein are patent. --No retroperitoneal lymphadenopathy. --No mesenteric lymphadenopathy. --No pelvic or inguinal lymphadenopathy. Reproductive: Status post hysterectomy. No adnexal mass. Other: No ascites or free air. There is a small amount of free fluid in the pelvis and upper abdomen. There is a likely iatrogenic fat containing  lumbar hernia on the right. Musculoskeletal. No acute displaced fractures. IMPRESSION: 1. Findings concerning for a moderate to high-grade enteritis involving the distal jejunum/proximal ileum. Ischemic enteritis seems less likely given a relatively normal appearance of the visualized vascular structures. There is no evidence for a high-grade small bowel obstruction. There is no pneumatosis. There is no free air. There is a small volume of free fluid in the abdomen, presumably reactive. 2. Large calcifications in the patient's low pelvis that appear to be scented within the urinary bladder and possibly the proximal urethra. Additionally, there appears to be some wall thickening of the urinary bladder concerning for cystitis. Correlation with urinalysis is recommended. 3. The patient appears to be status post prior appendectomy. Electronically Signed   By: Constance Holster M.D.   On: 06/19/2019 00:43    Procedures Procedures (including critical care time)  Medications Ordered in ED Medications  sodium chloride 0.9 % bolus 1,000 mL (0 mLs Intravenous Stopped 06/19/19 0100)  sodium chloride 0.9 % bolus 1,000 mL (0 mLs Intravenous Stopped 06/19/19 0130)  fentaNYL (SUBLIMAZE) injection 50 mcg (50 mcg Intravenous Given 06/18/19 2347)  ondansetron (ZOFRAN) injection 4 mg (4 mg Intravenous Given 06/18/19 2346)  iohexol (OMNIPAQUE) 300 MG/ML solution 100 mL (100 mLs Intravenous Contrast Given 06/19/19 0015)  ondansetron (ZOFRAN) injection 4 mg (4 mg Intravenous Given 06/19/19 0153)     Initial Impression / Assessment and Plan / ED Course  I have reviewed the triage vital signs and the nursing notes.  Pertinent labs & imaging results that were available during my care of the patient were reviewed by me and considered in my medical decision making (see chart for details).   I talked to the patient that if she has constipation pain medication would make the constipation worse but I did give her fentanyl and to  we could do a CT scan and see what was going on.  Laboratory testing was done and CT was ordered.  When I review her CT scan it was a little bit confusing.  I would suspect of enteritis she would have a lot of vomiting and diarrhea.  She also has a elevated white count although minimal.  Her urinalysis shows she has pretty moderately severe dehydration.  I am going to discuss her CT results with the hospitalist to try to  decide what treatment she needs.     04:22 AM Dr Olevia Bowens, will admit to obs.  We discussed her test results and her CT scan.  She is noted to be very dehydrated.  He is going to admit her for hydration and see if treating her constipation helps her abdominal pain.  Final Clinical Impressions(s) / ED Diagnoses   Final diagnoses:  Epigastric pain    Plan admission  Rolland Porter, MD, Barbette Or, MD 06/19/19 [phone]

## 2019-06-20 DIAGNOSIS — M797 Fibromyalgia: Secondary | ICD-10-CM | POA: Diagnosis present

## 2019-06-20 DIAGNOSIS — Z801 Family history of malignant neoplasm of trachea, bronchus and lung: Secondary | ICD-10-CM | POA: Diagnosis not present

## 2019-06-20 DIAGNOSIS — Z806 Family history of leukemia: Secondary | ICD-10-CM | POA: Diagnosis not present

## 2019-06-20 DIAGNOSIS — R0789 Other chest pain: Secondary | ICD-10-CM | POA: Diagnosis present

## 2019-06-20 DIAGNOSIS — Z8249 Family history of ischemic heart disease and other diseases of the circulatory system: Secondary | ICD-10-CM | POA: Diagnosis not present

## 2019-06-20 DIAGNOSIS — K219 Gastro-esophageal reflux disease without esophagitis: Secondary | ICD-10-CM | POA: Diagnosis present

## 2019-06-20 DIAGNOSIS — R110 Nausea: Secondary | ICD-10-CM | POA: Diagnosis not present

## 2019-06-20 DIAGNOSIS — E538 Deficiency of other specified B group vitamins: Secondary | ICD-10-CM | POA: Diagnosis present

## 2019-06-20 DIAGNOSIS — D519 Vitamin B12 deficiency anemia, unspecified: Secondary | ICD-10-CM | POA: Diagnosis present

## 2019-06-20 DIAGNOSIS — I10 Essential (primary) hypertension: Secondary | ICD-10-CM | POA: Diagnosis present

## 2019-06-20 DIAGNOSIS — Z8049 Family history of malignant neoplasm of other genital organs: Secondary | ICD-10-CM | POA: Diagnosis not present

## 2019-06-20 DIAGNOSIS — K5900 Constipation, unspecified: Secondary | ICD-10-CM | POA: Diagnosis present

## 2019-06-20 DIAGNOSIS — R1013 Epigastric pain: Secondary | ICD-10-CM | POA: Diagnosis present

## 2019-06-20 DIAGNOSIS — Z79890 Hormone replacement therapy (postmenopausal): Secondary | ICD-10-CM | POA: Diagnosis not present

## 2019-06-20 DIAGNOSIS — K50018 Crohn's disease of small intestine with other complication: Secondary | ICD-10-CM | POA: Diagnosis present

## 2019-06-20 DIAGNOSIS — K5000 Crohn's disease of small intestine without complications: Secondary | ICD-10-CM | POA: Diagnosis not present

## 2019-06-20 DIAGNOSIS — Z800 Family history of malignant neoplasm of digestive organs: Secondary | ICD-10-CM | POA: Diagnosis not present

## 2019-06-20 DIAGNOSIS — Z8541 Personal history of malignant neoplasm of cervix uteri: Secondary | ICD-10-CM | POA: Diagnosis not present

## 2019-06-20 DIAGNOSIS — M069 Rheumatoid arthritis, unspecified: Secondary | ICD-10-CM | POA: Diagnosis present

## 2019-06-20 DIAGNOSIS — Z20828 Contact with and (suspected) exposure to other viral communicable diseases: Secondary | ICD-10-CM | POA: Diagnosis present

## 2019-06-20 LAB — COMPREHENSIVE METABOLIC PANEL
ALT: 11 U/L (ref 0–44)
AST: 12 U/L — ABNORMAL LOW (ref 15–41)
Albumin: 2.8 g/dL — ABNORMAL LOW (ref 3.5–5.0)
Alkaline Phosphatase: 41 U/L (ref 38–126)
Anion gap: 5 (ref 5–15)
BUN: 9 mg/dL (ref 6–20)
CO2: 25 mmol/L (ref 22–32)
Calcium: 7.4 mg/dL — ABNORMAL LOW (ref 8.9–10.3)
Chloride: 109 mmol/L (ref 98–111)
Creatinine, Ser: 0.7 mg/dL (ref 0.44–1.00)
GFR calc Af Amer: >60 mL/min (ref >60)
GFR calc non Af Amer: >60 mL/min (ref >60)
Glucose, Bld: 88 mg/dL (ref 70–99)
Potassium: 3.5 mmol/L (ref 3.5–5.1)
Sodium: 139 mmol/L (ref 135–145)
Total Bilirubin: 0.4 mg/dL (ref 0.3–1.2)
Total Protein: 5.1 g/dL — ABNORMAL LOW (ref 6.5–8.1)

## 2019-06-20 LAB — CBC WITH DIFFERENTIAL/PLATELET
Abs Immature Granulocytes: 0.03 10*3/uL (ref 0.00–0.07)
Basophils Absolute: 0 10*3/uL (ref 0.0–0.1)
Basophils Relative: 0 %
Eosinophils Absolute: 0.1 10*3/uL (ref 0.0–0.5)
Eosinophils Relative: 2 %
HCT: 29.9 % — ABNORMAL LOW (ref 36.0–46.0)
Hemoglobin: 9.6 g/dL — ABNORMAL LOW (ref 12.0–15.0)
Immature Granulocytes: 0 %
Lymphocytes Relative: 42 %
Lymphs Abs: 3.2 10*3/uL (ref 0.7–4.0)
MCH: 33 pg (ref 26.0–34.0)
MCHC: 32.1 g/dL (ref 30.0–36.0)
MCV: 102.7 fL — ABNORMAL HIGH (ref 80.0–100.0)
Monocytes Absolute: 0.6 10*3/uL (ref 0.1–1.0)
Monocytes Relative: 8 %
Neutro Abs: 3.6 10*3/uL (ref 1.7–7.7)
Neutrophils Relative %: 48 %
Platelets: 163 10*3/uL (ref 150–400)
RBC: 2.91 MIL/uL — ABNORMAL LOW (ref 3.87–5.11)
RDW: 13.2 % (ref 11.5–15.5)
WBC: 7.5 10*3/uL (ref 4.0–10.5)
nRBC: 0 % (ref 0.0–0.2)

## 2019-06-20 LAB — SEDIMENTATION RATE: Sed Rate: 5 mm/hr (ref 0–22)

## 2019-06-20 LAB — MAGNESIUM: Magnesium: 1.9 mg/dL (ref 1.7–2.4)

## 2019-06-20 LAB — C-REACTIVE PROTEIN: CRP: 2.2 mg/dL — ABNORMAL HIGH (ref <1.0)

## 2019-06-20 MED ORDER — PROCHLORPERAZINE EDISYLATE 10 MG/2ML IJ SOLN
5.0000 mg | Freq: Four times a day (QID) | INTRAMUSCULAR | Status: DC | PRN
  Administered 2019-06-20 – 2019-06-23 (×4): 5 mg via INTRAVENOUS
  Filled 2019-06-20 (×4): qty 2

## 2019-06-20 MED ORDER — FENTANYL CITRATE (PF) 100 MCG/2ML IJ SOLN
25.0000 ug | INTRAMUSCULAR | Status: DC | PRN
  Administered 2019-06-20 – 2019-06-22 (×3): 25 ug via INTRAVENOUS
  Filled 2019-06-20 (×3): qty 2

## 2019-06-20 NOTE — Progress Notes (Signed)
  Subjective:  Patient feels better.  Pain is not as intense as it was yesterday.  She states pain is mainly in epigastric region.  She has not taken pain medication since last night.  She has not had a bowel movement.  Objective: Blood pressure (!) 96/58, pulse 66, temperature 98.2 F (36.8 C), temperature source Oral, resp. rate 16, height 5' 3" (1.6 m), weight 79.4 kg, SpO2 94 %. Patient is alert and appears to be comfortable today Cardiac exam with regular rhythm normal S1 and S2. Lungs are clear to auscultation. Abdomen is symmetrical.  Bowel sounds are normal.  On palpation abdomen is soft.  She has mild generalized tenderness but tenderness is moderate with guarding in epigastric region but no rebound today.  No organomegaly or masses. No calf tenderness or LE edema noted.  Labs/studies Results:  CBC Latest Ref Rng & Units 06/20/2019 06/19/2019 06/19/2019  WBC 4.0 - 10.5 K/uL 7.5 11.2(H) 10.6(H)  Hemoglobin 12.0 - 15.0 g/dL 9.6(L) 11.3(L) 11.4(L)  Hematocrit 36.0 - 46.0 % 29.9(L) 34.9(L) 34.5(L)  Platelets 150 - 400 K/uL 163 213 203    CMP Latest Ref Rng & Units 06/20/2019 06/19/2019 06/18/2019  Glucose 70 - 99 mg/dL 88 106(H) 111(H)  BUN 6 - 20 mg/dL _0 Creatinine 0.44 - 1.00 mg/dL 0.70 0.75 0.84  Sodium 135 - 145 mmol/L 139 136 137  Potassium 3.5 - 5.1 mmol/L 3.5 4.0 4.1  Chloride 98 - 111 mmol/L 109 107 103  CO2 22 - 32 mmol/L _1 Calcium 8.9 - 10.3 mg/dL 7.4(L) 7.9(L) 8.8(L)  Total Protein 6.5 - 8.1 g/dL 5.1(L) - 7.0  Total Bilirubin 0.3 - 1.2 mg/dL 0.4 - 0.5  Alkaline Phos 38 - 126 U/L 41 - 55  AST 15 - 41 U/L 12(L) - 16  ALT 0 - 44 U/L 11 - 13    Hepatic Function Latest Ref Rng & Units 06/20/2019 06/18/2019 10/16/2018  Total Protein 6.5 - 8.1 g/dL 5.1(L) 7.0 6.9  Albumin 3.5 - 5.0 g/dL 2.8(L) 3.8 3.6  AST 15 - 41 U/L 12(L) 16 20  ALT 0 - 44 U/L _2 Alk Phosphatase 38 - 126 U/L 41 55 57  Total Bilirubin 0.3 - 1.2 mg/dL 0.4 0.5 0.4  Bilirubin, Direct - - -  -    Lab Results  Component Value Date   CRP 2.2 (H) 06/20/2019      Assessment:  #1.  Acute enteritis.  Symptomatic improvement since she was last seen yesterday afternoon.  She is having less pain and less nausea.  CRP is mildly elevated.  Sed rate is normal.  ANA immunoglobulin level and GI pathogen panel pending.  #2.  Anemia.  Anemia secondary to acute illness.  No evidence of overt GI bleed.  #3.  GERD.  Patient remains on pantoprazole p.o.  Recommendations:  Advance diet to full liquids. Hemoccult x1. CBC in a.m.

## 2019-06-20 NOTE — Progress Notes (Signed)
PROGRESS NOTE    [name]  ZOX:[id number]  DOB: [birth year]  DOA: 06/18/2019 PCP: Asencion Noble, MD   Brief Admission Hx: 53 year old female with chronic constipation admitted with significant small bowel enteritis associated with severe abdominal pain nausea and vomiting.  MDM/Assessment & Plan:   1. Small bowel enteritis-patient says that the abdominal pain is improving with current treatments.  She has been on Cipro and Flagyl IV and she seems to be tolerating it at this time.  Her WBC is trending down.  She is afebrile.  Her blood cultures have been no growth to date. 2. Constipation-patient did receive an enema however she did not have good results.  She has been refusing to take the MiraLAX that we have offered.  She is having some nausea but no significant vomiting and has been tolerating diet. 3. GERD- Protonix ordered for GI protection. 4. Abdominal pain, epigastric- her pain is improving with treatments.  IV Dilaudid ordered as needed. 5. Anemia -stool Hemoccult ordered.  Likely some hemodilution from IV fluids which we have reduced the rate of the IV fluids today.  DVT prophylaxis: SCDs Code Status: Full Family Communication: Patient updated at bedside Disposition Plan: Continue inpatient treatments for IV antibiotics   Consultants:  GI  Procedures:    Antimicrobials:  Cipro/metronidazole  Subjective: The patient reports she is having less abdominal pain today.  She is having nausea but she is not vomiting and she seems to be tolerating diet.  She still has not had a bowel movement.  Objective: Vitals:   06/19/19 0908 06/19/19 1924 06/19/19 2108 06/20/19 0527  BP: 110/65  110/61 (!) 96/58  Pulse:   70 66  Resp:   17 16  Temp:   97.9 F (36.6 C) 98.2 F (36.8 C)  TempSrc:   Oral Oral  SpO2:  98%  94%  Weight:      Height:        Intake/Output Summary (Last 24 hours) at 06/20/2019 1415 Last data filed at 06/20/2019 1100 Gross per 24 hour  Intake  1810.88 ml  Output 900 ml  Net 910.88 ml   Filed Weights   06/18/19 2304  Weight: 79.4 kg     REVIEW OF SYSTEMS  As per history otherwise all reviewed and reported negative  Exam:  General exam: Awake, alert, no apparent distress, cooperative and pleasant. Respiratory system: Clear. No increased work of breathing. Cardiovascular system: S1 & S2 heard. No JVD, murmurs, gallops, clicks or pedal edema. Gastrointestinal system: Abdomen is nondistended, soft and mild epigastric tenderness with deep palpation. Normal bowel sounds heard. Central nervous system: Alert and oriented. No focal neurological deficits. Extremities: Trace pretibial edema no cyanosis.  Data Reviewed: Basic Metabolic Panel: Recent Labs  Lab 06/18/19 2338 06/19/19 0515 06/19/19 0810 06/20/19 0620  NA 137  --  136 139  K 4.1  --  4.0 3.5  CL 103  --  107 109  CO2 25  --  22 25  GLUCOSE 111*  --  106* 88  BUN 19  --  12 9  CREATININE 0.84  --  0.75 0.70  CALCIUM 8.8*  --  7.9* 7.4*  MG  --  1.7  --  1.9  PHOS  --  2.6  --   --    Liver Function Tests: Recent Labs  Lab 06/18/19 2338 06/20/19 0620  AST 16 12*  ALT 13 11  ALKPHOS 55 41  BILITOT 0.5 0.4  PROT 7.0 5.1*  ALBUMIN  3.8 2.8*   Recent Labs  Lab 06/18/19 2338  LIPASE 27   No results for input(s): AMMONIA in the last 168 hours. CBC: Recent Labs  Lab 06/18/19 2338 06/19/19 0515 06/19/19 0810 06/20/19 0620  WBC 12.8* 10.6* 11.2* 7.5  NEUTROABS 8.3* 8.2* 7.8* 3.6  HGB 12.7 11.4* 11.3* 9.6*  HCT 38.7 34.5* 34.9* 29.9*  MCV 99.5 100.3* 100.0 102.7*  PLT 223 203 213 163   Cardiac Enzymes: No results for input(s): CKTOTAL, CKMB, CKMBINDEX, TROPONINI in the last 168 hours. CBG (last 3)  No results for input(s): GLUCAP in the last 72 hours. Recent Results (from the past 240 hour(s))  SARS Coronavirus 2 (CEPHEID - Performed in [place] hospital lab), Hosp Order     Status: None   Collection Time: 06/19/19  4:25 AM   Specimen:  Nasopharyngeal Swab  Result Value Ref Range Status   SARS Coronavirus 2 NEGATIVE NEGATIVE Final    Comment: (NOTE) If result is NEGATIVE SARS-CoV-2 target nucleic acids are NOT DETECTED. The SARS-CoV-2 RNA is generally detectable in upper and lower  respiratory specimens during the acute phase of infection. The lowest  concentration of SARS-CoV-2 viral copies this assay can detect is 250  copies / mL. A negative result does not preclude SARS-CoV-2 infection  and should not be used as the sole basis for treatment or other  patient management decisions.  A negative result may occur with  improper specimen collection / handling, submission of specimen other  than nasopharyngeal swab, presence of viral mutation(s) within the  areas targeted by this assay, and inadequate number of viral copies  (<250 copies / mL). A negative result must be combined with clinical  observations, patient history, and epidemiological information. If result is POSITIVE SARS-CoV-2 target nucleic acids are DETECTED. The SARS-CoV-2 RNA is generally detectable in upper and lower  respiratory specimens dur ing the acute phase of infection.  Positive  results are indicative of active infection with SARS-CoV-2.  Clinical  correlation with patient history and other diagnostic information is  necessary to determine patient infection status.  Positive results do  not rule out bacterial infection or co-infection with other viruses. If result is PRESUMPTIVE POSTIVE SARS-CoV-2 nucleic acids MAY BE PRESENT.   A presumptive positive result was obtained on the submitted specimen  and confirmed on repeat testing.  While 2019 novel coronavirus  (SARS-CoV-2) nucleic acids may be present in the submitted sample  additional confirmatory testing may be necessary for epidemiological  and / or clinical management purposes  to differentiate between  SARS-CoV-2 and other Sarbecovirus currently known to infect humans.  If clinically  indicated additional testing with an alternate test  methodology [phone]) is advised. The SARS-CoV-2 RNA is generally  detectable in upper and lower respiratory sp ecimens during the acute  phase of infection. The expected result is Negative. Fact Sheet for Patients:  StrictlyIdeas.no Fact Sheet for Healthcare Providers: BankingDealers.co.za This test is not yet approved or cleared by the [place] FDA and has been authorized for detection and/or diagnosis of SARS-CoV-2 by FDA under an Emergency Use Authorization (EUA).  This EUA will remain in effect (meaning this test can be used) for the duration of the COVID-19 declaration under Section 564(b)(1) of the Act, 21 U.S.C. section 360bbb-3(b)(1), unless the authorization is terminated or revoked sooner. Performed at [facility], [address], [place], [place] [address]   Culture, blood (Routine X 2) w Reflex to ID Panel     Status: None (  Preliminary result)   Collection Time: 06/19/19 11:46 AM   Specimen: BLOOD LEFT WRIST  Result Value Ref Range Status   Specimen Description BLOOD LEFT WRIST  Final   Special Requests   Final    BOTTLES DRAWN AEROBIC AND ANAEROBIC Blood Culture adequate volume   Culture   Final    NO GROWTH < 24 HOURS Performed at [facility], [address], [place], [place] [address]    Report Status PENDING  Incomplete  Culture, blood (Routine X 2) w Reflex to ID Panel     Status: None (Preliminary result)   Collection Time: 06/19/19 11:56 AM   Specimen: BLOOD LEFT ARM  Result Value Ref Range Status   Specimen Description BLOOD LEFT ARM  Final   Special Requests   Final    BOTTLES DRAWN AEROBIC AND ANAEROBIC Blood Culture adequate volume   Culture   Final    NO GROWTH < 24 HOURS Performed at [facility], [address], [place], [place] [address]    Report Status PENDING  Incomplete     Studies: Dg Abdomen 1 View  Result Date: 06/18/2019 CLINICAL  DATA:  No bowel movement for 2 weeks. Abdominal pain. EXAM: ABDOMEN - 1 VIEW COMPARISON:  None. FINDINGS: Small to moderate stool in the rectum, small volume of stool elsewhere. No gaseous small bowel dilatation. No evidence of free air. Multiple surgical clips in the pelvis, cholecystectomy clips in the right upper quadrant. Multiple pelvic calcifications, typically phleboliths. No acute osseous abnormalities. IMPRESSION: Small colonic stool burden, with moderate stool in the rectum. No evidence of obstruction. Electronically Signed   By: Keith Rake M.D.   On: 06/18/2019 23:46   Ct Abdomen Pelvis W Contrast  Result Date: 06/19/2019 CLINICAL DATA:  Abdominal pain. EXAM: CT ABDOMEN AND PELVIS WITH CONTRAST TECHNIQUE: Multidetector CT imaging of the abdomen and pelvis was performed using the standard protocol following bolus administration of intravenous contrast. CONTRAST:  115mL OMNIPAQUE IOHEXOL 300 MG/ML  SOLN COMPARISON:  None. FINDINGS: Lower chest: The lung bases are clear. The heart size is normal. Hepatobiliary: The liver is normal. Status post cholecystectomy.There is no biliary ductal dilation. Pancreas: Normal contours without ductal dilatation. No peripancreatic fluid collection. Spleen: No splenic laceration or hematoma. Adrenals/Urinary Tract: --Adrenal glands: No adrenal hemorrhage. --Right kidney/ureter: No hydronephrosis or perinephric hematoma. --Left kidney/ureter: No hydronephrosis or perinephric hematoma. --Urinary bladder: There is some wall thickening and fat stranding surrounding the urinary bladder. There are large stones that appear to be centered within the urinary bladder and possibly within the proximal urethra. Stomach/Bowel: --Stomach/Duodenum: No hiatal hernia or other gastric abnormality. Normal duodenal course and caliber. --Small bowel: There is a long segment small bowel, likely the distal jejunum/proximal ileum that demonstrates diffuse wall thickening and adjacent fat  stranding. There is no significant dilatation of this segment of small bowel. There is no transition point. There is surrounding free fluid. There are additional loops of small bowel with some mild wall thickening. --Colon: There are few scattered colonic diverticula without CT evidence of diverticulitis. --Appendix: The appendix appears to be surgically absent. Vascular/Lymphatic: There is no abdominal aortic aneurysm. The SMA and celiac axis are patent. The SMV and portal vein are patent. --No retroperitoneal lymphadenopathy. --No mesenteric lymphadenopathy. --No pelvic or inguinal lymphadenopathy. Reproductive: Status post hysterectomy. No adnexal mass. Other: No ascites or free air. There is a small amount of free fluid in the pelvis and upper abdomen. There is a likely iatrogenic fat containing lumbar hernia on  the right. Musculoskeletal. No acute displaced fractures. IMPRESSION: 1. Findings concerning for a moderate to high-grade enteritis involving the distal jejunum/proximal ileum. Ischemic enteritis seems less likely given a relatively normal appearance of the visualized vascular structures. There is no evidence for a high-grade small bowel obstruction. There is no pneumatosis. There is no free air. There is a small volume of free fluid in the abdomen, presumably reactive. 2. Large calcifications in the patient's low pelvis that appear to be scented within the urinary bladder and possibly the proximal urethra. Additionally, there appears to be some wall thickening of the urinary bladder concerning for cystitis. Correlation with urinalysis is recommended. 3. The patient appears to be status post prior appendectomy. Electronically Signed   By: Constance Holster M.D.   On: 06/19/2019 00:43     Scheduled Meds:  estradiol  2 mg Oral Daily   gabapentin  400 mg Oral QHS   lisinopril  5 mg Oral Daily   pantoprazole  40 mg Oral Daily   polyethylene glycol  17 g Oral BID   Continuous Infusions:   sodium chloride 75 mL/hr at 06/20/19 1231   ciprofloxacin 400 mg (06/20/19 1106)   metronidazole 500 mg (06/20/19 0552)    Principal Problem:   Regional enteritis of small intestine (HCC) Active Problems:   Constipation   Gastroesophageal reflux disease without esophagitis   Abdominal pain, epigastric   Time spent:   Irwin Brakeman, MD [facility] 06/20/2019, 2:15 PM    LOS: 0 days  How to contact the [facility] Attending or Consulting provider [place] or covering provider during after hours [place], for this patient?  1. Check the care team in [facility] and look for a) attending/consulting TRH provider listed and b) the [facility] team listed 2. Log into [email or web address] and use [place]'s universal password to access. If you do not have the password, please contact the hospital operator. 3. Locate the [facility] provider you are looking for under [facility] and page to a number that you can be directly reached. 4. If you still have difficulty reaching the provider, please page the [facility] (Director on Call) for the Hospitalists listed on amion for assistance.

## 2019-06-21 LAB — VITAMIN B12: Vitamin B-12: 132 pg/mL — ABNORMAL LOW (ref 180–914)

## 2019-06-21 LAB — BASIC METABOLIC PANEL
Anion gap: 5 (ref 5–15)
BUN: 7 mg/dL (ref 6–20)
CO2: 25 mmol/L (ref 22–32)
Calcium: 7.6 mg/dL — ABNORMAL LOW (ref 8.9–10.3)
Chloride: 108 mmol/L (ref 98–111)
Creatinine, Ser: 0.75 mg/dL (ref 0.44–1.00)
GFR calc Af Amer: >60 mL/min (ref >60)
GFR calc non Af Amer: >60 mL/min (ref >60)
Glucose, Bld: 91 mg/dL (ref 70–99)
Potassium: 3.6 mmol/L (ref 3.5–5.1)
Sodium: 138 mmol/L (ref 135–145)

## 2019-06-21 LAB — CBC
HCT: 29.3 % — ABNORMAL LOW (ref 36.0–46.0)
Hemoglobin: 9.6 g/dL — ABNORMAL LOW (ref 12.0–15.0)
MCH: 33.6 pg (ref 26.0–34.0)
MCHC: 32.8 g/dL (ref 30.0–36.0)
MCV: 102.4 fL — ABNORMAL HIGH (ref 80.0–100.0)
Platelets: 150 10*3/uL (ref 150–400)
RBC: 2.86 MIL/uL — ABNORMAL LOW (ref 3.87–5.11)
RDW: 13 % (ref 11.5–15.5)
WBC: 7.2 10*3/uL (ref 4.0–10.5)
nRBC: 0 % (ref 0.0–0.2)

## 2019-06-21 LAB — IGG, IGA, IGM
IgA: 111 mg/dL (ref 87–352)
IgG (Immunoglobin G), Serum: 691 mg/dL (ref 586–1602)
IgM (Immunoglobulin M), Srm: 22 mg/dL — ABNORMAL LOW (ref 26–217)

## 2019-06-21 LAB — MAGNESIUM: Magnesium: 2 mg/dL (ref 1.7–2.4)

## 2019-06-21 LAB — TROPONIN I (HIGH SENSITIVITY): Troponin I (High Sensitivity): <2 ng/L (ref <18)

## 2019-06-21 LAB — OCCULT BLOOD X 1 CARD TO LAB, STOOL: Fecal Occult Bld: POSITIVE — AB

## 2019-06-21 MED ORDER — METRONIDAZOLE 500 MG PO TABS
500.0000 mg | ORAL_TABLET | Freq: Three times a day (TID) | ORAL | Status: DC
  Administered 2019-06-22 – 2019-06-23 (×3): 500 mg via ORAL
  Filled 2019-06-21 (×4): qty 1

## 2019-06-21 MED ORDER — CIPROFLOXACIN HCL 250 MG PO TABS
500.0000 mg | ORAL_TABLET | Freq: Two times a day (BID) | ORAL | Status: DC
  Administered 2019-06-21 – 2019-06-22 (×3): 500 mg via ORAL
  Filled 2019-06-21 (×4): qty 2

## 2019-06-21 MED ORDER — ALUM & MAG HYDROXIDE-SIMETH 200-200-20 MG/5ML PO SUSP
30.0000 mL | Freq: Once | ORAL | Status: DC
  Filled 2019-06-21: qty 30

## 2019-06-21 MED ORDER — FAMOTIDINE 20 MG PO TABS
20.0000 mg | ORAL_TABLET | Freq: Once | ORAL | Status: AC
  Administered 2019-06-21: 07:00:00 20 mg via ORAL
  Filled 2019-06-21: qty 1

## 2019-06-21 MED ORDER — LIDOCAINE VISCOUS HCL 2 % MT SOLN
15.0000 mL | Freq: Once | OROMUCOSAL | Status: DC
  Filled 2019-06-21: qty 15

## 2019-06-21 MED ORDER — PANTOPRAZOLE SODIUM 40 MG PO TBEC
40.0000 mg | DELAYED_RELEASE_TABLET | Freq: Two times a day (BID) | ORAL | Status: DC
  Administered 2019-06-21 – 2019-06-23 (×4): 40 mg via ORAL
  Filled 2019-06-21 (×4): qty 1

## 2019-06-21 MED ORDER — ACETAMINOPHEN 325 MG PO TABS
650.0000 mg | ORAL_TABLET | Freq: Four times a day (QID) | ORAL | Status: DC | PRN
  Administered 2019-06-21 – 2019-06-23 (×3): 650 mg via ORAL
  Filled 2019-06-21 (×4): qty 2

## 2019-06-21 NOTE — Progress Notes (Signed)
Patient c/o of not feeling good.  Stated that she feels like something is sitting on her chest, that if feels like pressure.  Stat EKG ordered.  On call MD notified and is currently seeing the patient.  Will continue to monitor.

## 2019-06-21 NOTE — Progress Notes (Signed)
PROGRESS NOTE    [name]  OIZ:[id number]  DOB: 07/28/1966  DOA: 06/18/2019 PCP: Asencion Noble, MD   Brief Admission Hx: 53 year old female with chronic constipation admitted with significant small bowel enteritis associated with severe abdominal pain nausea and vomiting.  MDM/Assessment & Plan:   1. Small bowel enteritis-patient says that the abdominal pain is improving with current treatments.  She has been on Cipro and Flagyl IV and she seems to be tolerating it at this time.  Her WBC is trending down appropriately and she is afebrile. Her blood cultures have been no growth to date.  Continue to follow GI service recommendations.  Will advance diet and if tolerated transition antibiotics to oral regimen. 2. Constipation-patient did receive an enema however she did not have good results immediately.  Given GI upset symptoms patient declined the use of MiraLAX.  Positive bowel movement earlier today.  Stool has been sent for studies. 3. GERD- Protonix ordered for GI protection.  Dose adjusted to twice a day for better symptom management. 4. Abdominal pain, epigastric- her pain is improving with treatments.  IV Dilaudid ordered as needed.  Continue antibiotics, if discomfort persists will repeat CT abdomen and abdomen IV steroids, as recommended by GI. 5. Anemia -stool Hemoccult ordered.  Likely some hemodilution from IV fluids.  No overt bleeding appreciated.  Patient is up-to-date on colonoscopy screening.  DVT prophylaxis: SCDs Code Status: Full Family Communication: Patient updated at bedside Disposition Plan: Continue inpatient treatments for IV fluids, pain medications and antibiotics.  If diet tolerated will advance oral regimen.   Consultants:  GI  Procedures:  See below for x-ray report.  Antimicrobials:  Cipro/metronidazole  Subjective: Patient reports an improvement in her abdominal discomfort, while still present.  No further vomiting but suffering of  intermittent episode of nausea.  She expressed wanting to have her diet advanced.  Objective: Vitals:   06/20/19 1953 06/20/19 2147 06/21/19 0539 06/21/19 1324  BP:  119/70 123/67 117/75  Pulse:  67 63 61  Resp:  16 17 20   Temp:  98.5 F (36.9 C) 98.4 F (36.9 C) 98.2 F (36.8 C)  TempSrc:   Oral Oral  SpO2: 92% 93% 97% 97%  Weight:      Height:        Intake/Output Summary (Last 24 hours) at 06/21/2019 1844 Last data filed at 06/21/2019 1700 Gross per 24 hour  Intake 1040 ml  Output 650 ml  Net 390 ml   Filed Weights   06/18/19 2304  Weight: 79.4 kg     REVIEW OF SYSTEMS  As per history otherwise all reviewed and reported negative  Exam: General exam: Alert, awake, oriented x 3; still reporting abdominal pain, complaining of intermittent nausea but no further vomiting.  Had positive bowel movement today. Respiratory system: Clear to auscultation. Respiratory effort normal. Cardiovascular system:RRR. No murmurs, rubs, gallops. Gastrointestinal system: Abdomen is tender to palpation, no distention, positive bowel sounds, no guarding.   Central nervous system: Alert and oriented. No focal neurological deficits. Extremities: No C/C/E, +pedal pulses Skin: No rashes, lesions or ulcers Psychiatry: Judgement and insight appear normal. Mood & affect appropriate.    Data Reviewed: Basic Metabolic Panel: Recent Labs  Lab 06/18/19 2338 06/19/19 0515 06/19/19 0810 06/20/19 0620 06/21/19 0608  NA 137  --  136 139 138  K 4.1  --  4.0 3.5 3.6  CL 103  --  107 109 108  CO2 25  --  22 25 25   GLUCOSE  111*  --  106* 88 91  BUN 19  --  12 9 7   CREATININE 0.84  --  0.75 0.70 0.75  CALCIUM 8.8*  --  7.9* 7.4* 7.6*  MG  --  1.7  --  1.9 2.0  PHOS  --  2.6  --   --   --    Liver Function Tests: Recent Labs  Lab 06/18/19 2338 06/20/19 0620  AST 16 12*  ALT 13 11  ALKPHOS 55 41  BILITOT 0.5 0.4  PROT 7.0 5.1*  ALBUMIN 3.8 2.8*   Recent Labs  Lab 06/18/19 2338    LIPASE 27   CBC: Recent Labs  Lab 06/18/19 2338 06/19/19 0515 06/19/19 0810 06/20/19 0620 06/21/19 0608  WBC 12.8* 10.6* 11.2* 7.5 7.2  NEUTROABS 8.3* 8.2* 7.8* 3.6  --   HGB 12.7 11.4* 11.3* 9.6* 9.6*  HCT 38.7 34.5* 34.9* 29.9* 29.3*  MCV 99.5 100.3* 100.0 102.7* 102.4*  PLT 223 203 213 163 150   CBG (last 3)  No results for input(s): GLUCAP in the last 72 hours. Recent Results (from the past 240 hour(s))  SARS Coronavirus 2 (CEPHEID - Performed in [place] hospital lab), Hosp Order     Status: None   Collection Time: 06/19/19  4:25 AM   Specimen: Nasopharyngeal Swab  Result Value Ref Range Status   SARS Coronavirus 2 NEGATIVE NEGATIVE Final    Comment: (NOTE) If result is NEGATIVE SARS-CoV-2 target nucleic acids are NOT DETECTED. The SARS-CoV-2 RNA is generally detectable in upper and lower  respiratory specimens during the acute phase of infection. The lowest  concentration of SARS-CoV-2 viral copies this assay can detect is 250  copies / mL. A negative result does not preclude SARS-CoV-2 infection  and should not be used as the sole basis for treatment or other  patient management decisions.  A negative result may occur with  improper specimen collection / handling, submission of specimen other  than nasopharyngeal swab, presence of viral mutation(s) within the  areas targeted by this assay, and inadequate number of viral copies  (<250 copies / mL). A negative result must be combined with clinical  observations, patient history, and epidemiological information. If result is POSITIVE SARS-CoV-2 target nucleic acids are DETECTED. The SARS-CoV-2 RNA is generally detectable in upper and lower  respiratory specimens dur ing the acute phase of infection.  Positive  results are indicative of active infection with SARS-CoV-2.  Clinical  correlation with patient history and other diagnostic information is  necessary to determine patient infection status.  Positive  results do  not rule out bacterial infection or co-infection with other viruses. If result is PRESUMPTIVE POSTIVE SARS-CoV-2 nucleic acids MAY BE PRESENT.   A presumptive positive result was obtained on the submitted specimen  and confirmed on repeat testing.  While 2019 novel coronavirus  (SARS-CoV-2) nucleic acids may be present in the submitted sample  additional confirmatory testing may be necessary for epidemiological  and / or clinical management purposes  to differentiate between  SARS-CoV-2 and other Sarbecovirus currently known to infect humans.  If clinically indicated additional testing with an alternate test  methodology [phone]) is advised. The SARS-CoV-2 RNA is generally  detectable in upper and lower respiratory sp ecimens during the acute  phase of infection. The expected result is Negative. Fact Sheet for Patients:  StrictlyIdeas.no Fact Sheet for Healthcare Providers: BankingDealers.co.za This test is not yet approved or cleared by the [place] FDA and has been authorized  for detection and/or diagnosis of SARS-CoV-2 by FDA under an Emergency Use Authorization (EUA).  This EUA will remain in effect (meaning this test can be used) for the duration of the COVID-19 declaration under Section 564(b)(1) of the Act, 21 U.S.C. section 360bbb-3(b)(1), unless the authorization is terminated or revoked sooner. Performed at [facility], [address], [place], [place] [address]   Culture, blood (Routine X 2) w Reflex to ID Panel     Status: None (Preliminary result)   Collection Time: 06/19/19 11:46 AM   Specimen: BLOOD LEFT WRIST  Result Value Ref Range Status   Specimen Description BLOOD LEFT WRIST  Final   Special Requests   Final    BOTTLES DRAWN AEROBIC AND ANAEROBIC Blood Culture adequate volume   Culture   Final    NO GROWTH 2 DAYS Performed at [facility], [address], [place], [place] [address]    Report  Status PENDING  Incomplete  Culture, blood (Routine X 2) w Reflex to ID Panel     Status: None (Preliminary result)   Collection Time: 06/19/19 11:56 AM   Specimen: BLOOD LEFT ARM  Result Value Ref Range Status   Specimen Description BLOOD LEFT ARM  Final   Special Requests   Final    BOTTLES DRAWN AEROBIC AND ANAEROBIC Blood Culture adequate volume   Culture   Final    NO GROWTH 2 DAYS Performed at [facility], [address] [place], [place] [address]    Report Status PENDING  Incomplete     Studies: No results found.   Scheduled Meds:  alum & mag hydroxide-simeth  30 mL Oral Once   And   lidocaine  15 mL Oral Once   ciprofloxacin  500 mg Oral BID   estradiol  2 mg Oral Daily   gabapentin  400 mg Oral QHS   lisinopril  5 mg Oral Daily   metroNIDAZOLE  500 mg Oral Q8H   pantoprazole  40 mg Oral BID   polyethylene glycol  17 g Oral BID   Continuous Infusions:  sodium chloride 75 mL/hr at 06/21/19 1400    Principal Problem:   Regional enteritis of small intestine (HCC) Active Problems:   Constipation   Gastroesophageal reflux disease without esophagitis   Abdominal pain, epigastric   Time spent: 30 minutes.  Barton Dubois, MD [phone]  [facility] 06/21/2019, 6:44 PM    LOS: 1 day  How to contact the [facility] Attending or Consulting provider [place] or covering provider during after hours [place], for this patient?  1. Check the care team in [facility] and look for a) attending/consulting TRH provider listed and b) the [facility] team listed 2. Log into [email or web address] and use [place]'s universal password to access. If you do not have the password, please contact the hospital operator. 3. Locate the [facility] provider you are looking for under [facility] and page to a number that you can be directly reached. 4. If you still have difficulty reaching the provider, please page the [facility] (Director on Call) for the Hospitalists listed on amion for assistance.

## 2019-06-21 NOTE — Progress Notes (Signed)
Patient is refusing Flagyl, stating that it makes her sick.  On call MD notified.  No new orders at this time.

## 2019-06-21 NOTE — Evaluation (Signed)
Physical Therapy Evaluation Patient Details Name: [name] MRN: [id number] DOB: 12-30-1965 Today's Date: 06/21/2019   History of Present Illness  [name] is a 53 y.o. 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  Her appetite has been decreased for several days.  She denies diarrhea, melena or hematochezia.  No dysuria, frequency or hematuria.  She denies fever, night sweats, but complains of chills and fatigue.  No sore throat, rhinorrhea, wheezing, hemoptysis or dyspnea.  Denies chest pain, palpitations, diaphoresis, PND, orthopnea or lower extremity edema.  She occasionally gets positional lightheadedness.  She denies polyuria, polydipsia, polyphagia or blurred vision.  She states that she feels a little anxious about her symptoms and has had trouble sleeping.    Clinical Impression  Patient functioning at baseline for functional mobility and gait, other than c/o nausea and self limited activity due to this.  Patient encouraged to stay out of bed and ambulate daily as tolerated for length of stay.  Plan:  Patient discharged from physical therapy to care of nursing for ambulation daily as tolerated for length of stay -  RN notified concerning patient's c/o nausea.     Follow Up Recommendations No PT follow up    Equipment Recommendations  None recommended by PT    Recommendations for Other Services       Precautions / Restrictions Precautions Precautions: None Restrictions Weight Bearing Restrictions: No      Mobility  Bed  Mobility Overal bed mobility: Independent                Transfers Overall transfer level: Independent                  Ambulation/Gait Ambulation/Gait assistance: Modified independent (Device/Increase time) Gait Distance (Feet): 20 Feet Assistive device: None Gait Pattern/deviations: WFL(Within Functional Limits) Gait velocity: decreased   General Gait Details: slightly labored cadence without loss of balance, limited due to c/o nausea  Stairs            Wheelchair Mobility    Modified Rankin (Stroke Patients Only)       Balance Overall balance assessment: Independent                                           Pertinent Vitals/Pain Pain Assessment: No/denies pain    Home Living Family/patient expects to be discharged to:: Private residence Living Arrangements: Spouse/significant other Available Help at Discharge: Family Type of Home: House Home Access: Ramped entrance     Home Layout: One level Home Equipment: None      Prior Function Level of Independence: Independent         Comments: community ambulator, drives, works     Journalist, newspaper        Extremity/Trunk Assessment   Upper Extremity Assessment Upper Extremity Assessment: Overall WFL for tasks assessed    Lower Extremity Assessment Lower Extremity Assessment: Overall WFL for tasks assessed    Cervical / Trunk Assessment Cervical / Trunk Assessment: Normal  Communication  Communication: No difficulties  Cognition Arousal/Alertness: Awake/alert Behavior During Therapy: WFL for tasks assessed/performed Overall Cognitive Status: Within Functional Limits for tasks assessed                                        General Comments      Exercises     Assessment/Plan    PT Assessment Patent does not need any further PT services  PT Problem List         PT Treatment Interventions      PT Goals (Current goals can be found in  the Care Plan section)  Acute Rehab PT Goals Patient Stated Goal: return home PT Goal Formulation: With patient Time For Goal Achievement: 06/21/19 Potential to Achieve Goals: Good    Frequency     Barriers to discharge        Co-evaluation               AM-PAC PT "6 Clicks" Mobility  Outcome Measure Help needed turning from your back to your side while in a flat bed without using bedrails?: None Help needed moving from lying on your back to sitting on the side of a flat bed without using bedrails?: None Help needed moving to and from a bed to a chair (including a wheelchair)?: None Help needed standing up from a chair using your arms (e.g., wheelchair or bedside chair)?: None Help needed to walk in hospital room?: None Help needed climbing 3-5 steps with a railing? : None 6 Click Score: 24    End of Session   Activity Tolerance: Patient tolerated treatment well(Patient limited by nausea) Patient left: in chair;with call bell/phone within reach Nurse Communication: Mobility status PT Visit Diagnosis: Unsteadiness on feet (R26.81);Other abnormalities of gait and mobility (R26.89);Muscle weakness (generalized) (M62.81)    Time: 6568-1275 PT Time Calculation (min) (ACUTE ONLY): 14 min   Charges:   PT Evaluation $PT Eval Low Complexity: 1 Low PT Treatments $Therapeutic Activity: 8-22 mins        12:21 PM, 06/21/19 Lonell Grandchild, MPT Physical Therapist with [facility] [phone] office [phone] mobile phone

## 2019-06-21 NOTE — Progress Notes (Signed)
Subjective:  Patient says she had chest pain last evening.  She describes it to be pressure not associated with shortness of breath or diaphoresis.  She was seen by Dr. Olevia Bowens.  She had normal EKG and troponin level.  She says chest pressure has decreased but not gone away.  She feels this is different than heartburn.  She complains of pain primarily in epigastric region.  She says she is not nauseated and would like to try real food.  She is presently on full liquids. She is passing flatus but has not had a bowel movement.   Current Medications:  Current Facility-Administered Medications:  .  0.9 %  sodium chloride infusion, , Intravenous, Continuous, Johnson, Clanford L, MD, Last Rate: 75 mL/hr at 06/20/19 1918 .  acetaminophen (TYLENOL) tablet 650 mg, 650 mg, Oral, Q6H PRN, Reubin Milan, MD, 650 mg at 06/21/19 [phone] .  alum & mag hydroxide-simeth (MAALOX/MYLANTA) 200-200-20 MG/5ML suspension 30 mL, 30 mL, Oral, Once **AND** lidocaine (XYLOCAINE) 2 % viscous mouth solution 15 mL, 15 mL, Oral, Once, Reubin Milan, MD .  ciprofloxacin (CIPRO) IVPB 400 mg, 400 mg, Intravenous, Q12H, Johnson, Clanford L, MD, Last Rate: 200 mL/hr at 06/21/19 0853, 400 mg at 06/21/19 0853 .  estradiol (ESTRACE) tablet 2 mg, 2 mg, Oral, Daily, Reubin Milan, MD, 2 mg at 06/21/19 0848 .  fentaNYL (SUBLIMAZE) injection 25 mcg, 25 mcg, Intravenous, Q2H PRN, Wynetta Emery, Clanford L, MD, 25 mcg at 06/21/19 8185 .  gabapentin (NEURONTIN) capsule 400 mg, 400 mg, Oral, QHS, Reubin Milan, MD, 400 mg at 06/20/19 2126 .  lisinopril (ZESTRIL) tablet 5 mg, 5 mg, Oral, Daily, Reubin Milan, MD, 5 mg at 06/21/19 0848 .  metroNIDAZOLE (FLAGYL) IVPB 500 mg, 500 mg, Intravenous, Q8H, Johnson, Clanford L, MD, Last Rate: 100 mL/hr at 06/21/19 0610, 500 mg at 06/21/19 0610 .  ondansetron (ZOFRAN) tablet 4 mg, 4 mg, Oral, Q6H PRN, 4 mg at 06/19/19 0911 **OR** ondansetron (ZOFRAN) injection 4 mg, 4 mg, Intravenous,  Q6H PRN, Reubin Milan, MD, 4 mg at 06/21/19 6314 .  pantoprazole (PROTONIX) EC tablet 40 mg, 40 mg, Oral, Daily, Reubin Milan, MD, 40 mg at 06/21/19 0848 .  polyethylene glycol (MIRALAX / GLYCOLAX) packet 17 g, 17 g, Oral, BID, Johnson, Clanford L, MD, 17 g at 06/20/19 1102 .  prochlorperazine (COMPAZINE) injection 5 mg, 5 mg, Intravenous, Q6H PRN, Johnson, Clanford L, MD, 5 mg at 06/20/19 1723   Objective: Blood pressure 123/67, pulse 63, temperature 98.4 F (36.9 C), temperature source Oral, resp. rate 17, height _0  (1.6 m), weight 79.4 kg, SpO2 97 %.  Patient is alert.  She appears to be comfortable sitting in a reclining chair. Cardiac exam with regular rhythm normal S1 and S2.  No murmur gallop noted. Lungs are clear to auscultation. Abdomen is full.  Bowel sounds are normal.  On palpation abdomen is soft with generalized tenderness which is mild across lower abdomen and moderate in mid epigastric region with some guarding.  No organomegaly or masses. No LE edema noted.   Labs/studies Results:  CBC Latest Ref Rng & Units 06/21/2019 06/20/2019 06/19/2019  WBC 4.0 - 10.5 K/uL 7.2 7.5 11.2(H)  Hemoglobin 12.0 - 15.0 g/dL 9.6(L) 9.6(L) 11.3(L)  Hematocrit 36.0 - 46.0 % 29.3(L) 29.9(L) 34.9(L)  Platelets 150 - 400 K/uL 150 163 213    CMP Latest Ref Rng & Units 06/21/2019 06/20/2019 06/19/2019  Glucose 70 - 99 mg/dL 91 88 106(H)  BUN 6 - 20 mg/dL _0 Creatinine 0.44 - 1.00 mg/dL 0.75 0.70 0.75  Sodium 135 - 145 mmol/L 138 139 136  Potassium 3.5 - 5.1 mmol/L 3.6 3.5 4.0  Chloride 98 - 111 mmol/L 108 109 107  CO2 22 - 32 mmol/L _1 Calcium 8.9 - 10.3 mg/dL 7.6(L) 7.4(L) 7.9(L)  Total Protein 6.5 - 8.1 g/dL - 5.1(L) -  Total Bilirubin 0.3 - 1.2 mg/dL - 0.4 -  Alkaline Phos 38 - 126 U/L - 41 -  AST 15 - 41 U/L - 12(L) -  ALT 0 - 44 U/L - 11 -    Hepatic Function Latest Ref Rng & Units 06/20/2019 06/18/2019 10/16/2018  Total Protein 6.5 - 8.1 g/dL 5.1(L) 7.0 6.9   Albumin 3.5 - 5.0 g/dL 2.8(L) 3.8 3.6  AST 15 - 41 U/L 12(L) 16 20  ALT 0 - 44 U/L _2 Alk Phosphatase 38 - 126 U/L 41 55 57  Total Bilirubin 0.3 - 1.2 mg/dL 0.4 0.5 0.4  Bilirubin, Direct - - - -    Lab Results  Component Value Date   CRP 2.2 (H) 06/20/2019      Assessment:  #1.  Acute enteritis involving jejunum and proximal ileum.  She is on empiric therapy with Cipro and metronidazole.  She appears to be improving slowly.  She has not had a bowel movement and therefore stool study has not been completed yet.  #2.  Chest pain.  She experienced chest pressure last night.  Troponin level and EKG normal.  This may be due to sub-diaphragmatic process or atypical manifestation of GERD.  #3.  Anemia.  Hemoglobin has leveled off.  No change in the last 24 hours.  #4.  Chronic constipation.  She is up-to-date on CRC screening/surveillance.  Recommendations:  Mechanical soft low residue diet. Continue IV Cipro and metronidazole for now.  If she is able to tolerate diet she could be switched to oral route either later today or tomorrow. If pain worsens we will repeat CT and consider adding IV steroids.

## 2019-06-21 NOTE — Progress Notes (Signed)
Night shift hospitalist MedSurg coverage note  The patient was seen due to chest pressure.  This pressure is non-radiated and the discomfort increases with inspiration.  She states that he feels like her heartburn, but this is high her on her chest than usual.  She usually experiences heartburn as epigastric and lower chest discomfort.  She still feels nauseous, but there is no change in the sensation.  No dyspnea, dizziness, palpitations, diaphoresis, PND, orthopnea or pitting edema of the lower extremities. Her most recent vital signs temperature 98.4, pulse 63, respiration 17, blood pressure 123/67 mmHg and O2 sat 97% on room air.  Neck is supple, no JVD. Lungs clear to auscultation. Heart S1-S2 RRR. Abdomen soft, mild epigastric tenderness. Extremities no edema. Neuro AAO x3, grossly nonfocal  Atypical chest pain Her EKG was normal. Her PRN analgesic will be given. Famotidine 20 mg p.o. x1 dose. Maalox plus viscous lidocaine x1.  Tennis Must, MD

## 2019-06-22 DIAGNOSIS — R110 Nausea: Secondary | ICD-10-CM

## 2019-06-22 LAB — BASIC METABOLIC PANEL
Anion gap: 8 (ref 5–15)
BUN: 6 mg/dL (ref 6–20)
CO2: 24 mmol/L (ref 22–32)
Calcium: 7.9 mg/dL — ABNORMAL LOW (ref 8.9–10.3)
Chloride: 107 mmol/L (ref 98–111)
Creatinine, Ser: 0.66 mg/dL (ref 0.44–1.00)
GFR calc Af Amer: >60 mL/min (ref >60)
GFR calc non Af Amer: >60 mL/min (ref >60)
Glucose, Bld: 113 mg/dL — ABNORMAL HIGH (ref 70–99)
Potassium: 3.5 mmol/L (ref 3.5–5.1)
Sodium: 139 mmol/L (ref 135–145)

## 2019-06-22 LAB — CBC
HCT: 29.8 % — ABNORMAL LOW (ref 36.0–46.0)
Hemoglobin: 9.6 g/dL — ABNORMAL LOW (ref 12.0–15.0)
MCH: 32.3 pg (ref 26.0–34.0)
MCHC: 32.2 g/dL (ref 30.0–36.0)
MCV: 100.3 fL — ABNORMAL HIGH (ref 80.0–100.0)
Platelets: 173 10*3/uL (ref 150–400)
RBC: 2.97 MIL/uL — ABNORMAL LOW (ref 3.87–5.11)
RDW: 12.9 % (ref 11.5–15.5)
WBC: 8.4 10*3/uL (ref 4.0–10.5)
nRBC: 0 % (ref 0.0–0.2)

## 2019-06-22 LAB — GASTROINTESTINAL PANEL BY PCR, STOOL (REPLACES STOOL CULTURE)

## 2019-06-22 LAB — ANTINUCLEAR ANTIBODIES, IFA: ANA Ab, IFA: NEGATIVE

## 2019-06-22 NOTE — Progress Notes (Signed)
Patient is refusing her Cipro this morning, stating it makes her sick. MD is made aware.

## 2019-06-22 NOTE — Progress Notes (Signed)
Subjective:  Patient continues to complain of nausea.  She did not eat anything yesterday.  She ate some of her breakfast.  She continues to complain of nausea.  She believes medication made her nausea worse.  She refused to take Cipro and metronidazole this morning.  She says pain is primarily in mid epigastric region.  She still has pain when she takes deep breath or voids.  She had 2 loose bowel movements yesterday.  Stool was not black or red.  Objective: Blood pressure (!) 100/58, pulse (!) 54, temperature 98.5 F (36.9 C), temperature source Oral, resp. rate 17, height 5' 3"  (1.6 m), weight 79.4 kg, SpO2 96 %. Patient is alert and in no acute distress. Cardiac exam with regular rhythm normal S1 and S2.  No murmur gallop noted. Auscultation lungs reveal vesicular breath sounds bilaterally. Abdomen is symmetrical.  Bowel sounds are normal.  Abdomen is soft.  She has no tenderness across lower half of her abdomen.  She remains with moderate tenderness with some guarding in mid epigastric region.  No organomegaly or masses.  Labs/studies Results:  CBC Latest Ref Rng & Units 06/22/2019 06/21/2019 06/20/2019  WBC 4.0 - 10.5 K/uL 8.4 7.2 7.5  Hemoglobin 12.0 - 15.0 g/dL 9.6(L) 9.6(L) 9.6(L)  Hematocrit 36.0 - 46.0 % 29.8(L) 29.3(L) 29.9(L)  Platelets 150 - 400 K/uL 173 150 163    CMP Latest Ref Rng & Units 06/22/2019 06/21/2019 06/20/2019  Glucose 70 - 99 mg/dL 113(H) 91 88  BUN 6 - 20 mg/dL 6 7 9   Creatinine 0.44 - 1.00 mg/dL 0.66 0.75 0.70  Sodium 135 - 145 mmol/L 139 138 139  Potassium 3.5 - 5.1 mmol/L 3.5 3.6 3.5  Chloride 98 - 111 mmol/L 107 108 109  CO2 22 - 32 mmol/L 24 25 25   Calcium 8.9 - 10.3 mg/dL 7.9(L) 7.6(L) 7.4(L)  Total Protein 6.5 - 8.1 g/dL - - 5.1(L)  Total Bilirubin 0.3 - 1.2 mg/dL - - 0.4  Alkaline Phos 38 - 126 U/L - - 41  AST 15 - 41 U/L - - 12(L)  ALT 0 - 44 U/L - - 11    Hepatic Function Latest Ref Rng & Units 06/20/2019 06/18/2019 10/16/2018  Total Protein 6.5 -  8.1 g/dL 5.1(L) 7.0 6.9  Albumin 3.5 - 5.0 g/dL 2.8(L) 3.8 3.6  AST 15 - 41 U/L 12(L) 16 20  ALT 0 - 44 U/L 11 13 13   Alk Phosphatase 38 - 126 U/L 41 55 57  Total Bilirubin 0.3 - 1.2 mg/dL 0.4 0.5 0.4  Bilirubin, Direct - - - -    Stool guaiac is positive.  GI pathogen panel is pending.  ANA is pending.  Serum IgG 691(normal) Serum IgA 111 (normal) Serum IgM 22 (low)  Assessment:  #1.  Acute enteritis involving segments of jejunum and proximal ileum.  Quite severe changes of inflammation on CT.  She is on empiric therapy with Cipro and metronidazole which has been changed to oral route but she refused to take this morning.  Oral intake very poor.  She continues to improve gradually. I suspect her nausea is due to underlying condition rather than the medication or both. Her oral intake is poor and I do not believe she is ready for discharge. Serum IgG and IgA are normal.  Serum IgM is mildly decreased possibly of no clinical significance.  #2.  Anemia.  Her hemoglobin has remained unchanged over the last 2 days.  Her stool is quite positive.  Anemia  is felt to be due to acute illness and and blood loss due to enteritis.  No frank bleeding though.  #3.  Atypical chest pain.  Chest pain has resolved.  Possibly due to GERD and/or sub-diaphragmatic process.   Recommendations:  Patient encouraged to take Cipro and metronidazole by mouth and see how she does. Await results of GI pathogen panel.

## 2019-06-22 NOTE — Progress Notes (Signed)
PROGRESS NOTE    [name]  HYQ:[id number]  DOB: December 16, 1965  DOA: 06/18/2019 PCP: Asencion Noble, MD   Brief Admission Hx: 53 year old female with chronic constipation admitted with significant small bowel enteritis associated with severe abdominal pain nausea and vomiting.  MDM/Assessment & Plan:   1. Small bowel enteritis-patient says that the abdominal pain is improving with current treatments.  She has been on Cipro and Flagyl IV and she seems to be tolerating it at this time.  Her WBC continue trending down appropriately and she is afebrile. Her blood cultures have been no growth to date.  Continue to follow GI service recommendations.  Will advance diet and continue to encourage oral abx's. GI panel neg. 2. Constipation-patient did receive an enema however she did not have good results immediately.  Given GI upset symptoms patient declined the use of MiraLAX.  Positive bowel movement earlier today.  Stool has been sent for studies.  3. GERD- Protonix ordered for GI protection.  Dose adjusted to twice a day for better symptom management. 4. Abdominal pain, epigastric- her pain is slowly improving with treatments.  Continue IV Dilaudid as needed.  Continue antibiotics, if discomfort persists will repeat CT abdomen and abdomen IV steroids, as recommended by GI. 5. Anemia -stool Hemoccult ordered.  Likely some hemodilution from IV fluids.  No overt bleeding appreciated.  Patient is up-to-date on colonoscopy screening.  DVT prophylaxis: SCDs Code Status: Full Family Communication: Patient updated at bedside Disposition Plan: Continue inpatient treatments for IV fluids, pain medications and antibiotics; still with nusea and poor oral intake. Will continue advancing PO and testing oral abx's tolerance. No further vomiting.    Consultants:  GI  Procedures:  See below for x-ray report.  Antimicrobials:  Cipro/metronidazole  Subjective: No fever, no Cp, no SOB. No further  vomiting. Still with mid abd pain, intermittent nausea and poor oral intake.   Objective: Vitals:   06/22/19 0619 06/22/19 0817 06/22/19 1522 06/22/19 2102  BP: (!) 100/58  119/61 120/73  Pulse: (!) 54  65 (!) 59  Resp: 17  16 16   Temp: 98.5 F (36.9 C)  98.5 F (36.9 C) (!) 97.5 F (36.4 C)  TempSrc: Oral  Oral Oral  SpO2: 97% 96% 98% 97%  Weight:      Height:        Intake/Output Summary (Last 24 hours) at 06/22/2019 2116 Last data filed at 06/22/2019 1809 Gross per 24 hour  Intake 480 ml  Output -  Net 480 ml   Filed Weights   06/18/19 2304  Weight: 79.4 kg     REVIEW OF SYSTEMS  As per history otherwise all reviewed and reported negative  Exam: General exam: Alert, awake, oriented x 3; reports still poor oral intake and intermittent nausea. No further vomiting. Expressed mild difficulty tolerating oral antibiotics and continue mid abd pain. Respiratory system: Clear to auscultation. Respiratory effort normal. Cardiovascular system:RRR. No murmurs, rubs, gallops. Gastrointestinal system: Abdomen is nondistended, soft and mild to moderatedly tender on palpation. No organomegaly or masses felt. Normal bowel sounds heard. Central nervous system: Alert and oriented. No focal neurological deficits. Extremities: No C/C/E, +pedal pulses Skin: No rashes, lesions or ulcers Psychiatry: Judgement and insight appear normal. Mood & affect appropriate.    Data Reviewed: Basic Metabolic Panel: Recent Labs  Lab 06/18/19 2338 06/19/19 0515 06/19/19 0810 06/20/19 0620 06/21/19 0608 06/22/19 0605  NA 137  --  136 139 138 139  K 4.1  --  4.0 3.5  3.6 3.5  CL 103  --  107 109 108 107  CO2 25  --  22 25 25 24   GLUCOSE 111*  --  106* 88 91 113*  BUN 19  --  12 9 7 6   CREATININE 0.84  --  0.75 0.70 0.75 0.66  CALCIUM 8.8*  --  7.9* 7.4* 7.6* 7.9*  MG  --  1.7  --  1.9 2.0  --   PHOS  --  2.6  --   --   --   --    Liver Function Tests: Recent Labs  Lab 06/18/19 2338  06/20/19 0620  AST 16 12*  ALT 13 11  ALKPHOS 55 41  BILITOT 0.5 0.4  PROT 7.0 5.1*  ALBUMIN 3.8 2.8*   Recent Labs  Lab 06/18/19 2338  LIPASE 27   CBC: Recent Labs  Lab 06/18/19 2338 06/19/19 0515 06/19/19 0810 06/20/19 0620 06/21/19 0608 06/22/19 0605  WBC 12.8* 10.6* 11.2* 7.5 7.2 8.4  NEUTROABS 8.3* 8.2* 7.8* 3.6  --   --   HGB 12.7 11.4* 11.3* 9.6* 9.6* 9.6*  HCT 38.7 34.5* 34.9* 29.9* 29.3* 29.8*  MCV 99.5 100.3* 100.0 102.7* 102.4* 100.3*  PLT 223 203 213 163 150 173   CBG (last 3)  No results for input(s): GLUCAP in the last 72 hours. Recent Results (from the past 240 hour(s))  SARS Coronavirus 2 (CEPHEID - Performed in [place] hospital lab), Hosp Order     Status: None   Collection Time: 06/19/19  4:25 AM   Specimen: Nasopharyngeal Swab  Result Value Ref Range Status   SARS Coronavirus 2 NEGATIVE NEGATIVE Final    Comment: (NOTE) If result is NEGATIVE SARS-CoV-2 target nucleic acids are NOT DETECTED. The SARS-CoV-2 RNA is generally detectable in upper and lower  respiratory specimens during the acute phase of infection. The lowest  concentration of SARS-CoV-2 viral copies this assay can detect is 250  copies / mL. A negative result does not preclude SARS-CoV-2 infection  and should not be used as the sole basis for treatment or other  patient management decisions.  A negative result may occur with  improper specimen collection / handling, submission of specimen other  than nasopharyngeal swab, presence of viral mutation(s) within the  areas targeted by this assay, and inadequate number of viral copies  (<250 copies / mL). A negative result must be combined with clinical  observations, patient history, and epidemiological information. If result is POSITIVE SARS-CoV-2 target nucleic acids are DETECTED. The SARS-CoV-2 RNA is generally detectable in upper and lower  respiratory specimens dur ing the acute phase of infection.  Positive  results are  indicative of active infection with SARS-CoV-2.  Clinical  correlation with patient history and other diagnostic information is  necessary to determine patient infection status.  Positive results do  not rule out bacterial infection or co-infection with other viruses. If result is PRESUMPTIVE POSTIVE SARS-CoV-2 nucleic acids MAY BE PRESENT.   A presumptive positive result was obtained on the submitted specimen  and confirmed on repeat testing.  While 2019 novel coronavirus  (SARS-CoV-2) nucleic acids may be present in the submitted sample  additional confirmatory testing may be necessary for epidemiological  and / or clinical management purposes  to differentiate between  SARS-CoV-2 and other Sarbecovirus currently known to infect humans.  If clinically indicated additional testing with an alternate test  methodology [phone]) is advised. The SARS-CoV-2 RNA is generally  detectable in upper and lower respiratory  sp ecimens during the acute  phase of infection. The expected result is Negative. Fact Sheet for Patients:  StrictlyIdeas.no Fact Sheet for Healthcare Providers: BankingDealers.co.za This test is not yet approved or cleared by the [place] FDA and has been authorized for detection and/or diagnosis of SARS-CoV-2 by FDA under an Emergency Use Authorization (EUA).  This EUA will remain in effect (meaning this test can be used) for the duration of the COVID-19 declaration under Section 564(b)(1) of the Act, 21 U.S.C. section 360bbb-3(b)(1), unless the authorization is terminated or revoked sooner. Performed at [facility], [address], [place], [place] [address]   Culture, blood (Routine X 2) w Reflex to ID Panel     Status: None (Preliminary result)   Collection Time: 06/19/19 11:46 AM   Specimen: BLOOD LEFT WRIST  Result Value Ref Range Status   Specimen Description BLOOD LEFT WRIST  Final   Special Requests   Final     BOTTLES DRAWN AEROBIC AND ANAEROBIC Blood Culture adequate volume   Culture   Final    NO GROWTH 2 DAYS Performed at [facility], [address], [place], [place] [address]    Report Status PENDING  Incomplete  Culture, blood (Routine X 2) w Reflex to ID Panel     Status: None (Preliminary result)   Collection Time: 06/19/19 11:56 AM   Specimen: BLOOD LEFT ARM  Result Value Ref Range Status   Specimen Description BLOOD LEFT ARM  Final   Special Requests   Final    BOTTLES DRAWN AEROBIC AND ANAEROBIC Blood Culture adequate volume   Culture   Final    NO GROWTH 2 DAYS Performed at [facility], [address], [place], [place] [address]    Report Status PENDING  Incomplete  Gastrointestinal Panel by PCR , Stool     Status: None   Collection Time: 06/21/19 10:03 AM   Specimen: Stool  Result Value Ref Range Status   Campylobacter species NOT DETECTED NOT DETECTED Final   Plesimonas shigelloides NOT DETECTED NOT DETECTED Final   Salmonella species NOT DETECTED NOT DETECTED Final   Yersinia enterocolitica NOT DETECTED NOT DETECTED Final   Vibrio species NOT DETECTED NOT DETECTED Final   Vibrio cholerae NOT DETECTED NOT DETECTED Final   Enteroaggregative E coli (EAEC) NOT DETECTED NOT DETECTED Final   Enteropathogenic E coli (EPEC) NOT DETECTED NOT DETECTED Final   Enterotoxigenic E coli (ETEC) NOT DETECTED NOT DETECTED Final   Shiga like toxin producing E coli (STEC) NOT DETECTED NOT DETECTED Final   Shigella/Enteroinvasive E coli (EIEC) NOT DETECTED NOT DETECTED Final   Cryptosporidium NOT DETECTED NOT DETECTED Final   Cyclospora cayetanensis NOT DETECTED NOT DETECTED Final   Entamoeba histolytica NOT DETECTED NOT DETECTED Final   Giardia lamblia NOT DETECTED NOT DETECTED Final   Adenovirus F40/41 NOT DETECTED NOT DETECTED Final   Astrovirus NOT DETECTED NOT DETECTED Final   Norovirus GI/GII NOT DETECTED NOT DETECTED Final   Rotavirus A NOT DETECTED NOT DETECTED Final   Sapovirus  (I, II, IV, and V) NOT DETECTED NOT DETECTED Final    Comment: Performed at [facility], [address] [place], [place] [address]     Studies: No results found.   Scheduled Meds: . alum & mag hydroxide-simeth  30 mL Oral Once   And  . lidocaine  15 mL Oral Once  . ciprofloxacin  500 mg Oral BID  . estradiol  2 mg Oral Daily  . gabapentin  400  mg Oral QHS  . lisinopril  5 mg Oral Daily  . metroNIDAZOLE  500 mg Oral Q8H  . pantoprazole  40 mg Oral BID  . polyethylene glycol  17 g Oral BID   Continuous Infusions: . sodium chloride Stopped (06/22/19 2107)    Principal Problem:   Regional enteritis of small intestine (HCC) Active Problems:   Constipation   Gastroesophageal reflux disease without esophagitis   Abdominal pain, epigastric   Time spent: 30 minutes.  Barton Dubois, MD [phone]  [facility] 06/22/2019, 9:16 PM    LOS: 2 days  How to contact the [facility] Attending or Consulting provider [place] or covering provider during after hours [place], for this patient?  1. Check the care team in [facility] and look for a) attending/consulting [facility] provider listed and b) the [facility] team listed 2. Log into [email or web address] and use [place]'s universal password to access. If you do not have the password, please contact the hospital operator. 3. Locate the [facility] provider you are looking for under [facility] and page to a number that you can be directly reached. 4. If you still have difficulty reaching the provider, please page the [facility] (Director on Call) for the Hospitalists listed on amion for assistance.

## 2019-06-23 ENCOUNTER — Inpatient Hospital Stay (HOSPITAL_COMMUNITY): Payer: BC Managed Care – PPO

## 2019-06-23 ENCOUNTER — Other Ambulatory Visit: Payer: Self-pay | Admitting: Women's Health

## 2019-06-23 ENCOUNTER — Other Ambulatory Visit: Payer: Self-pay | Admitting: Obstetrics & Gynecology

## 2019-06-23 DIAGNOSIS — D519 Vitamin B12 deficiency anemia, unspecified: Secondary | ICD-10-CM

## 2019-06-23 DIAGNOSIS — R110 Nausea: Secondary | ICD-10-CM

## 2019-06-23 LAB — CBC
HCT: 33.6 % — ABNORMAL LOW (ref 36.0–46.0)
Hemoglobin: 11 g/dL — ABNORMAL LOW (ref 12.0–15.0)
MCH: 33 pg (ref 26.0–34.0)
MCHC: 32.7 g/dL (ref 30.0–36.0)
MCV: 100.9 fL — ABNORMAL HIGH (ref 80.0–100.0)
Platelets: 195 10*3/uL (ref 150–400)
RBC: 3.33 MIL/uL — ABNORMAL LOW (ref 3.87–5.11)
RDW: 13 % (ref 11.5–15.5)
WBC: 7.9 10*3/uL (ref 4.0–10.5)
nRBC: 0 % (ref 0.0–0.2)

## 2019-06-23 LAB — C-REACTIVE PROTEIN: CRP: 1.2 mg/dL — ABNORMAL HIGH (ref <1.0)

## 2019-06-23 MED ORDER — CIPROFLOXACIN HCL 500 MG PO TABS: 500.0000 mg | ORAL_TABLET | Freq: Two times a day (BID) | ORAL | 0 refills | Status: AC

## 2019-06-23 MED ORDER — ONDANSETRON 8 MG PO TBDP: 8.0000 mg | ORAL_TABLET | Freq: Three times a day (TID) | ORAL | 0 refills | Status: DC | PRN

## 2019-06-23 MED ORDER — CYANOCOBALAMIN 1000 MCG/ML IJ SOLN
1000.0000 ug | Freq: Every day | INTRAMUSCULAR | Status: DC
  Administered 2019-06-23: 1000 ug via INTRAMUSCULAR
  Filled 2019-06-23: qty 1

## 2019-06-23 MED ORDER — METRONIDAZOLE IN NACL 5-0.79 MG/ML-% IV SOLN
500.0000 mg | Freq: Three times a day (TID) | INTRAVENOUS | Status: DC
  Filled 2019-06-23: qty 100

## 2019-06-23 MED ORDER — OXYCODONE HCL 5 MG PO TABS: 5.0000 mg | ORAL_TABLET | Freq: Three times a day (TID) | ORAL | 0 refills | Status: AC | PRN

## 2019-06-23 MED ORDER — CYANOCOBALAMIN 1000 MCG/ML IJ SOLN: INTRAMUSCULAR | 0 refills | Status: DC

## 2019-06-23 MED ORDER — PANTOPRAZOLE SODIUM 40 MG PO TBEC: 40.0000 mg | DELAYED_RELEASE_TABLET | Freq: Two times a day (BID) | ORAL | 1 refills | Status: DC

## 2019-06-23 MED ORDER — CIPROFLOXACIN IN D5W 400 MG/200ML IV SOLN
400.0000 mg | Freq: Two times a day (BID) | INTRAVENOUS | Status: DC
  Administered 2019-06-23: 09:00:00 400 mg via INTRAVENOUS
  Filled 2019-06-23: qty 200

## 2019-06-23 MED ORDER — IOHEXOL 300 MG/ML  SOLN
100.0000 mL | Freq: Once | INTRAMUSCULAR | Status: AC | PRN
  Administered 2019-06-23: 11:00:00 100 mL via INTRAVENOUS

## 2019-06-23 MED ORDER — ACETAMINOPHEN 325 MG PO TABS: 650.0000 mg | ORAL_TABLET | Freq: Four times a day (QID) | ORAL | 0 refills | Status: DC | PRN

## 2019-06-23 MED ORDER — METRONIDAZOLE 500 MG PO TABS: 500.0000 mg | ORAL_TABLET | Freq: Three times a day (TID) | ORAL | 0 refills | Status: AC

## 2019-06-23 NOTE — Progress Notes (Addendum)
Lab studies reviewed with patient. CRP has decreased from 2.2 to 1.2 WBC remains normal and hemoglobin is up to 11 g. Patient has received B12 injection for low B12.  CT images reviewed with patient. Significant improvement in changes of enteritis.  I feel patient stable for discharge. She can gradually advance her diet. She will need 4 more days of antibiotics. She can use pain medication on as-needed basis which she may require for the next couple of days. I will plan to see patient back in the office next week.  Discussed with Dr. Dyann Kief.

## 2019-06-23 NOTE — Progress Notes (Signed)
IV removed, patient tolerated well. Reviewed AVS with patient, who verbalized understanding.  Patient to be transported home by her husband, to be taken to short stay lobby via wheelchair.

## 2019-06-23 NOTE — Progress Notes (Signed)
  Subjective:  Patient does not feel well.  She states her pain got worse after she ate her supper.  She had no more than 50%.  She feels nauseated.  She is afraid to eat.  She is passing flatus but has not had a bowel movement in 24 hours.  She had 2 loose stools the day before.  She denies chest pain or heartburn.  She says pain is mainly located in epigastric region.  She rates it at 7.  Objective: Blood pressure 115/65, pulse 61, temperature 98.4 F (36.9 C), temperature source Oral, resp. rate 16, height _0  (1.6 m), weight 79.4 kg, SpO2 97 %. Patient is alert and does not appear to be in any distress. Abdomen is symmetrical.  Bowel sounds are normal.  Abdomen is soft.  She has moderate tenderness in mid epigastric region with some fullness in guarding.  No organomegaly or masses.  Labs/studies Results:  CBC Latest Ref Rng & Units 06/22/2019 06/21/2019 06/20/2019  WBC 4.0 - 10.5 K/uL 8.4 7.2 7.5  Hemoglobin 12.0 - 15.0 g/dL 9.6(L) 9.6(L) 9.6(L)  Hematocrit 36.0 - 46.0 % 29.8(L) 29.3(L) 29.9(L)  Platelets 150 - 400 K/uL 173 150 163    CMP Latest Ref Rng & Units 06/22/2019 06/21/2019 06/20/2019  Glucose 70 - 99 mg/dL 113(H) 91 88  BUN 6 - 20 mg/dL _1 Creatinine 0.44 - 1.00 mg/dL 0.66 0.75 0.70  Sodium 135 - 145 mmol/L 139 138 139  Potassium 3.5 - 5.1 mmol/L 3.5 3.6 3.5  Chloride 98 - 111 mmol/L 107 108 109  CO2 22 - 32 mmol/L _2 Calcium 8.9 - 10.3 mg/dL 7.9(L) 7.6(L) 7.4(L)  Total Protein 6.5 - 8.1 g/dL - - 5.1(L)  Total Bilirubin 0.3 - 1.2 mg/dL - - 0.4  Alkaline Phos 38 - 126 U/L - - 41  AST 15 - 41 U/L - - 12(L)  ALT 0 - 44 U/L - - 11    Hepatic Function Latest Ref Rng & Units 06/20/2019 06/18/2019 10/16/2018  Total Protein 6.5 - 8.1 g/dL 5.1(L) 7.0 6.9  Albumin 3.5 - 5.0 g/dL 2.8(L) 3.8 3.6  AST 15 - 41 U/L 12(L) 16 20  ALT 0 - 44 U/L _3 Alk Phosphatase 38 - 126 U/L 41 55 57  Total Bilirubin 0.3 - 1.2 mg/dL 0.4 0.5 0.4  Bilirubin, Direct - - - -    GI  pathogen panel is negative.  Assessment:  #1.  Acute enteritis involving segments of jejunum and proximal ileum.  GI pathogen panel negative.  Sed rate and ANA negative.  IgG not elevated.  She has been on empiric antibiotic therapy with Cipro and metronidazole.  She is having worsening pain after she ate evening meal.  She remains very tender in mid epigastric region.  She needs to be reevaluated with another CT.  If CT reveals worsening inflammation will initiate IV steroid therapy and consider push enteroscopy.  #2.  GERD.  Heartburn is well controlled with therapy.  #3.  Anemia.  Anemia secondary to acute condition and loss from small bowel inflammation.  Recommendations:  CBC and CRP this morning. Abdominal pelvic CT with contrast to reevaluate small bowel inflammation. If small bowel inflammation has progressed would consider IV steroids and push enteroscopy. Diet changed to full liquids.

## 2019-06-23 NOTE — Discharge Summary (Signed)
Physician Discharge Summary  [name]:[id number] DOB: 23-Aug-1966 DOA: 06/18/2019  PCP: Asencion Noble, MD  Admit date: 06/18/2019 Discharge date: 06/23/2019  Time spent: 35 minutes  Recommendations for Outpatient Follow-up:  Repeat CBC to follow hemoglobin trend Repeat basic metabolic panel to follow electrolytes and renal function Reassess B12 level in 67-month.  Discharge Diagnoses:  Principal Problem:   Regional enteritis of small intestine ([place]) Active Problems:   Constipation   Gastroesophageal reflux disease   Abdominal pain, epigastric   Anemia due to vitamin B12 deficiency   Nausea   Discharge Condition: Stable and improved.  Patient discharged home with instructions to follow-up with PCP and gastroenterology service at discharge.  Diet recommendation: Full liquid to soft diet, instructed to advance consistency slowly.  Filed Weights   06/18/19 2304  Weight: 79.4 kg    History of present illness:  As per H&Pwritten by Dr. Tennis Must on 06/19/2019 53 y.o. 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  Her appetite has been decreased for several days.  She denies diarrhea, melena or hematochezia.  No dysuria, frequency or hematuria.  She denies fever, night sweats, but complains of chills and fatigue.  No sore throat, rhinorrhea, wheezing, hemoptysis or dyspnea.  Denies chest pain, palpitations, diaphoresis, PND, orthopnea or lower extremity edema.  She occasionally gets positional lightheadedness.  She denies polyuria, polydipsia,  polyphagia or blurred vision.  She states that she feels a little anxious about her symptoms and has had trouble sleeping.  ED Course: Initial vital signs temperature 99.3 F, pulse 100, respirations 18, blood pressure 118/76 and O2 sat 95% on room air.  The patient received 2 L of normal saline bolus, 2 doses of ondansetron 4 mg IVP and 50 mcg of fentanyl IVP.  Her work-up shows urinalysis with a specific gravity of more than 1.0 46, but otherwise unremarkable.  CBC had a white count of 12.8 with 60% neutrophils, 26% lymphocytes and 7% monocytes.  Hemoglobin was 12.7 g/dL and platelets 223.  CMP showed within range results, except for a glucose of 111 and calcium of 8.8 mg/dL, which normalizes with albumin level correction.  Lipase was 27 units/L.  Hospital Course:  1. Small bowel enteritis-patient says that the abdominal pain is improving with current treatments.  She was treated with Cipro and Flagyl IV and subsequently transition to oral regimen to finalize therapy.  Her WBC continue trending down appropriately and she is afebrile. Her blood cultures have been no growth to date. Continue to follow with GI service as an outpatient.  GI panel neg. patient received prescription for pain medication and antiemetics to use as needed. 2. Constipation-patient did receive an enema however she did not have good results immediately.  Given GI upset symptoms patient declined the use of MiraLAX.    Patient had a good bowel movement on 06/21/2019.  GI pathogen panel negative.  Advised to maintain adequate hydration. 3. GERD- Protonix ordered for GI protection.  Dose adjusted to twice a day for better symptom management.  Prescription given at discharge. 4. Abdominal pain, epigastric- her pain is slowly improving with treatments.  Repeat CT scan demonstrating significant improvement, no further episode of vomiting, WBCs trending down and patient has remained afebrile.  Following GI recommendations will discharge  home to complete oral antibiotics regimen and outpatient follow-up 5. Essential hypertension: Continue the use of low-dose lisinopril.  Blood pressure stable and well-controlled. 6. Anemia/B12 deficiency-stool Hemoccult ordered mildly positive.  Likely some hemodilution from IV fluids small bleeding from enteritis process.  No overt bleeding appreciated.  Patient is up-to-date on colonoscopy screening.  B12 was found to be low and will be appropriately repleted.   Procedures:  See below for x-ray reports  Consultations:  Gastroenterology service  Discharge Exam: Vitals:   06/22/19 2102 06/23/19 0509  BP: 120/73 115/65  Pulse: (!) 59 61  Resp: 16 16  Temp: (!) 97.5 F (36.4 C) 98.4 F (36.9 C)  SpO2: 97% 97%   General exam: Alert, awake, oriented x 3; reports still poor oral intake and intermittent nausea. No further vomiting appreciated reported. Expressed to continue half mild  mid abd pain. Respiratory system: Clear to auscultation. Respiratory effort normal. Cardiovascular system:RRR. No murmurs, rubs, gallops. Gastrointestinal system: Abdomen is nondistended, soft and mild to moderatedly tender on palpation. No organomegaly or masses felt. Normal bowel sounds heard. Central nervous system: Alert and oriented. No focal neurological deficits. Extremities: No C/C/E, +pedal pulses Skin: No rashes, lesions or ulcers Psychiatry: Judgement and insight appear normal. Mood & affect appropriate.   Discharge Instructions   Discharge Instructions    Discharge instructions   Complete by: As directed    Maintain adequate hydration Advance diet consistency slowly Take medications as prescribed Follow-up with gastroenterology service as instructed Follow-up with PCP in 2 weeks.     Allergies as of 06/23/2019      Reactions   Imitrex [sumatriptan]    Causes a migraine   Cymbalta [duloxetine Hcl] Rash   Latex Rash   Propoxyphene N-acetaminophen Nausea And Vomiting, Rash    Tramadol Rash      Medication List    TAKE these medications   acetaminophen 325 MG tablet Commonly known as: TYLENOL Take 2 tablets (650 mg total) by mouth every 6 (six) hours as needed for mild pain, fever or headache.   aspirin-acetaminophen-caffeine 250-250-65 MG tablet Commonly known as: EXCEDRIN MIGRAINE Take 2 tablets by mouth every 6 (six) hours as needed for headache or migraine.   augmented betamethasone dipropionate 0.05 % cream Commonly known as: DIPROLENE-AF Apply 1 application topically at bedtime.   ciprofloxacin 500 MG tablet Commonly known as: CIPRO Take 1 tablet (500 mg total) by mouth 2 (two) times daily for 5 days.   cyanocobalamin 1000 MCG/ML injection Commonly known as: (VITAMIN B-12) Inject 1 mL intramuscularly daily for 1 week; weekly for 1 month and then monthly after that.   escitalopram 10 MG tablet Commonly known as: LEXAPRO Take 10 mg by mouth daily.   estradiol 2 MG tablet Commonly known as: ESTRACE TAKE 1 TABLET BY MOUTH EVERY DAY   gabapentin 400 MG capsule Commonly known as: NEURONTIN Take 1 capsule (400 mg total) by mouth at bedtime.   lisinopril 5 MG tablet Commonly known as: ZESTRIL Take 1 tablet (5 mg total) by mouth daily.   metroNIDAZOLE 500 MG tablet Commonly known as: Flagyl Take 1 tablet (500 mg total) by mouth 3 (three) times daily for 5 days.   ondansetron 8 MG disintegrating tablet Commonly known as: Zofran ODT Take 1 tablet (8 mg total) by mouth every 8 (eight) hours as needed for nausea  or vomiting.   oxyCODONE 5 MG immediate release tablet Commonly known as: Roxicodone Take 1 tablet (5 mg total) by mouth every 8 (eight) hours as needed for up to 5 days for severe pain.   pantoprazole 40 MG tablet Commonly known as: PROTONIX Take 1 tablet (40 mg total) by mouth 2 (two) times daily. What changed: when to take this   Vitamin D3 125 MCG (5000 UT) Caps Take 5,000 Units by mouth daily.      Allergies  Allergen  Reactions  . Imitrex [Sumatriptan]     Causes a migraine  . Cymbalta [Duloxetine Hcl] Rash  . Latex Rash  . Propoxyphene N-Acetaminophen Nausea And Vomiting and Rash  . Tramadol Rash   Follow-up Information    Asencion Noble, MD Follow up on 06/30/2019.   Specialty: Internal Medicine Why: Please follow up with Dr. Willey Blade on Tuesday, July 21st at 11:15am.  Contact information: [address] [place] [address] [phone]            The results of significant diagnostics from this hospitalization (including imaging, microbiology, ancillary and laboratory) are listed below for reference.    Significant Diagnostic Studies: Dg Chest 2 View  Result Date: 06/04/2019 CLINICAL DATA:  Chest pain EXAM: CHEST - 2 VIEW COMPARISON:  October 16, 2018 FINDINGS: The heart size and mediastinal contours are within normal limits. Both lungs are clear. The visualized skeletal structures are unremarkable. IMPRESSION: No active cardiopulmonary disease. Electronically Signed   By: Abelardo Diesel M.D.   On: 06/04/2019 18:06   Dg Abdomen 1 View  Result Date: 06/18/2019 CLINICAL DATA:  No bowel movement for 2 weeks. Abdominal pain. EXAM: ABDOMEN - 1 VIEW COMPARISON:  None. FINDINGS: Small to moderate stool in the rectum, small volume of stool elsewhere. No gaseous small bowel dilatation. No evidence of free air. Multiple surgical clips in the pelvis, cholecystectomy clips in the right upper quadrant. Multiple pelvic calcifications, typically phleboliths. No acute osseous abnormalities. IMPRESSION: Small colonic stool burden, with moderate stool in the rectum. No evidence of obstruction. Electronically Signed   By: Keith Rake M.D.   On: 06/18/2019 23:46   Ct Abdomen Pelvis W Contrast  Result Date: 06/23/2019 CLINICAL DATA:  Recent CT abdomen of 06/19/2019 showed findings concerning for high-grade enteritis involving the distal jejunum/proximal ileum. Continued mid abdominal pain and loose stools.  EXAM: CT ABDOMEN AND PELVIS WITH CONTRAST TECHNIQUE: Multidetector CT imaging of the abdomen and pelvis was performed using the standard protocol following bolus administration of intravenous contrast. CONTRAST:  160mL OMNIPAQUE IOHEXOL 300 MG/ML  SOLN COMPARISON:  CT abdomen dated 06/19/2019. FINDINGS: Lower chest: Mild bibasilar atelectasis. Hepatobiliary: No focal liver abnormality is seen. Status post cholecystectomy. No biliary dilatation. Pancreas: Unremarkable. No pancreatic ductal dilatation or surrounding inflammatory changes. Spleen: Normal in size without focal abnormality. Adrenals/Urinary Tract: Adrenal glands appear normal. Kidneys are unremarkable without mass, stone or hydronephrosis. No ureteral or bladder calculi identified. Bladder appears normal, partially decompressed. Stomach/Bowel: Moderate amount of stool throughout the nondistended large bowel. No evidence of colonic wall thickening. Mildly distended loops of small bowel within the central abdomen with associated air-fluid levels, but with significant improvement of the bowel wall thickening and near complete resolution of the surrounding mesenteric fluid/inflammation. Stomach is unremarkable, partially decompressed. Vascular/Lymphatic: No significant vascular findings are present. No enlarged abdominal or pelvic lymph nodes. Reproductive: Presumed hysterectomy. No adnexal mass or free fluid seen. Other: No free fluid or abscess collection. No free intraperitoneal air. Musculoskeletal: No  acute or significant osseous abnormality. IMPRESSION: 1. Near complete resolution of the small bowel enteritis demonstrated on CT abdomen of 06/19/2019. There is only mild residual thickening of the walls of the small bowel in the LEFT abdomen (previously marked bowel wall thickening). The free fluid and inflammation previously noted within the mesentery has resolved. Persistent mild distention of the small bowel within the central to LEFT abdomen, with  associated air-fluid levels, compatible with mild residual ileus. 2. No evidence of bowel obstruction on today's study. No free fluid or abscess collection on today's study. Moderate amount of stool within the nondistended colon. 3. Remainder of the abdomen and pelvis CT is unremarkable, as detailed above. Electronically Signed   By: Franki Cabot M.D.   On: 06/23/2019 11:19   Ct Abdomen Pelvis W Contrast  Result Date: 06/19/2019 CLINICAL DATA:  Abdominal pain. EXAM: CT ABDOMEN AND PELVIS WITH CONTRAST TECHNIQUE: Multidetector CT imaging of the abdomen and pelvis was performed using the standard protocol following bolus administration of intravenous contrast. CONTRAST:  174mL OMNIPAQUE IOHEXOL 300 MG/ML  SOLN COMPARISON:  None. FINDINGS: Lower chest: The lung bases are clear. The heart size is normal. Hepatobiliary: The liver is normal. Status post cholecystectomy.There is no biliary ductal dilation. Pancreas: Normal contours without ductal dilatation. No peripancreatic fluid collection. Spleen: No splenic laceration or hematoma. Adrenals/Urinary Tract: --Adrenal glands: No adrenal hemorrhage. --Right kidney/ureter: No hydronephrosis or perinephric hematoma. --Left kidney/ureter: No hydronephrosis or perinephric hematoma. --Urinary bladder: There is some wall thickening and fat stranding surrounding the urinary bladder. There are large stones that appear to be centered within the urinary bladder and possibly within the proximal urethra. Stomach/Bowel: --Stomach/Duodenum: No hiatal hernia or other gastric abnormality. Normal duodenal course and caliber. --Small bowel: There is a long segment small bowel, likely the distal jejunum/proximal ileum that demonstrates diffuse wall thickening and adjacent fat stranding. There is no significant dilatation of this segment of small bowel. There is no transition point. There is surrounding free fluid. There are additional loops of small bowel with some mild wall thickening.  --Colon: There are few scattered colonic diverticula without CT evidence of diverticulitis. --Appendix: The appendix appears to be surgically absent. Vascular/Lymphatic: There is no abdominal aortic aneurysm. The SMA and celiac axis are patent. The SMV and portal vein are patent. --No retroperitoneal lymphadenopathy. --No mesenteric lymphadenopathy. --No pelvic or inguinal lymphadenopathy. Reproductive: Status post hysterectomy. No adnexal mass. Other: No ascites or free air. There is a small amount of free fluid in the pelvis and upper abdomen. There is a likely iatrogenic fat containing lumbar hernia on the right. Musculoskeletal. No acute displaced fractures. IMPRESSION: 1. Findings concerning for a moderate to high-grade enteritis involving the distal jejunum/proximal ileum. Ischemic enteritis seems less likely given a relatively normal appearance of the visualized vascular structures. There is no evidence for a high-grade small bowel obstruction. There is no pneumatosis. There is no free air. There is a small volume of free fluid in the abdomen, presumably reactive. 2. Large calcifications in the patient's low pelvis that appear to be scented within the urinary bladder and possibly the proximal urethra. Additionally, there appears to be some wall thickening of the urinary bladder concerning for cystitis. Correlation with urinalysis is recommended. 3. The patient appears to be status post prior appendectomy. Electronically Signed   By: Constance Holster M.D.   On: 06/19/2019 00:43    Microbiology: Recent Results (from the past 240 hour(s))  SARS Coronavirus 2 (CEPHEID - Performed in [place] hospital  lab), Hosp Order     Status: None   Collection Time: 06/19/19  4:25 AM   Specimen: Nasopharyngeal Swab  Result Value Ref Range Status   SARS Coronavirus 2 NEGATIVE NEGATIVE Final    Comment: (NOTE) If result is NEGATIVE SARS-CoV-2 target nucleic acids are NOT DETECTED. The SARS-CoV-2 RNA is  generally detectable in upper and lower  respiratory specimens during the acute phase of infection. The lowest  concentration of SARS-CoV-2 viral copies this assay can detect is 250  copies / mL. A negative result does not preclude SARS-CoV-2 infection  and should not be used as the sole basis for treatment or other  patient management decisions.  A negative result may occur with  improper specimen collection / handling, submission of specimen other  than nasopharyngeal swab, presence of viral mutation(s) within the  areas targeted by this assay, and inadequate number of viral copies  (<250 copies / mL). A negative result must be combined with clinical  observations, patient history, and epidemiological information. If result is POSITIVE SARS-CoV-2 target nucleic acids are DETECTED. The SARS-CoV-2 RNA is generally detectable in upper and lower  respiratory specimens dur ing the acute phase of infection.  Positive  results are indicative of active infection with SARS-CoV-2.  Clinical  correlation with patient history and other diagnostic information is  necessary to determine patient infection status.  Positive results do  not rule out bacterial infection or co-infection with other viruses. If result is PRESUMPTIVE POSTIVE SARS-CoV-2 nucleic acids MAY BE PRESENT.   A presumptive positive result was obtained on the submitted specimen  and confirmed on repeat testing.  While 2019 novel coronavirus  (SARS-CoV-2) nucleic acids may be present in the submitted sample  additional confirmatory testing may be necessary for epidemiological  and / or clinical management purposes  to differentiate between  SARS-CoV-2 and other Sarbecovirus currently known to infect humans.  If clinically indicated additional testing with an alternate test  methodology [phone]) is advised. The SARS-CoV-2 RNA is generally  detectable in upper and lower respiratory sp ecimens during the acute  phase of  infection. The expected result is Negative. Fact Sheet for Patients:  StrictlyIdeas.no Fact Sheet for Healthcare Providers: BankingDealers.co.za This test is not yet approved or cleared by the [place] FDA and has been authorized for detection and/or diagnosis of SARS-CoV-2 by FDA under an Emergency Use Authorization (EUA).  This EUA will remain in effect (meaning this test can be used) for the duration of the COVID-19 declaration under Section 564(b)(1) of the Act, 21 U.S.C. section 360bbb-3(b)(1), unless the authorization is terminated or revoked sooner. Performed at [facility], [address], [place], [place] [address]   Culture, blood (Routine X 2) w Reflex to ID Panel     Status: None (Preliminary result)   Collection Time: 06/19/19 11:46 AM   Specimen: BLOOD LEFT WRIST  Result Value Ref Range Status   Specimen Description BLOOD LEFT WRIST  Final   Special Requests   Final    BOTTLES DRAWN AEROBIC AND ANAEROBIC Blood Culture adequate volume   Culture   Final    NO GROWTH 4 DAYS Performed at [facility], [address], [place], [place] [address]    Report Status PENDING  Incomplete  Culture, blood (Routine X 2) w Reflex to ID Panel     Status: None (Preliminary result)   Collection Time: 06/19/19 11:56 AM   Specimen: BLOOD LEFT ARM  Result Value Ref Range Status   Specimen Description  BLOOD LEFT ARM  Final   Special Requests   Final    BOTTLES DRAWN AEROBIC AND ANAEROBIC Blood Culture adequate volume   Culture   Final    NO GROWTH 4 DAYS Performed at [facility], [address] [place], [place] [address]    Report Status PENDING  Incomplete  Gastrointestinal Panel by PCR , Stool     Status: None   Collection Time: 06/21/19 10:03 AM   Specimen: Stool  Result Value Ref Range Status   Campylobacter species NOT DETECTED NOT DETECTED Final   Plesimonas shigelloides NOT DETECTED NOT DETECTED Final   Salmonella species  NOT DETECTED NOT DETECTED Final   Yersinia enterocolitica NOT DETECTED NOT DETECTED Final   Vibrio species NOT DETECTED NOT DETECTED Final   Vibrio cholerae NOT DETECTED NOT DETECTED Final   Enteroaggregative E coli (EAEC) NOT DETECTED NOT DETECTED Final   Enteropathogenic E coli (EPEC) NOT DETECTED NOT DETECTED Final   Enterotoxigenic E coli (ETEC) NOT DETECTED NOT DETECTED Final   Shiga like toxin producing E coli (STEC) NOT DETECTED NOT DETECTED Final   Shigella/Enteroinvasive E coli (EIEC) NOT DETECTED NOT DETECTED Final   Cryptosporidium NOT DETECTED NOT DETECTED Final   Cyclospora cayetanensis NOT DETECTED NOT DETECTED Final   Entamoeba histolytica NOT DETECTED NOT DETECTED Final   Giardia lamblia NOT DETECTED NOT DETECTED Final   Adenovirus F40/41 NOT DETECTED NOT DETECTED Final   Astrovirus NOT DETECTED NOT DETECTED Final   Norovirus GI/GII NOT DETECTED NOT DETECTED Final   Rotavirus A NOT DETECTED NOT DETECTED Final   Sapovirus (I, II, IV, and V) NOT DETECTED NOT DETECTED Final    Comment: Performed at [facility], [address] [place], [place] [address]     Labs: Basic Metabolic Panel: Recent Labs  Lab 06/18/19 2338 06/19/19 0515 06/19/19 0810 06/20/19 0620 06/21/19 0608 06/22/19 0605  NA 137  --  136 139 138 139  K 4.1  --  4.0 3.5 3.6 3.5  CL 103  --  107 109 108 107  CO2 25  --  22 25 25 24   GLUCOSE 111*  --  106* 88 91 113*  BUN 19  --  12 9 7 6   CREATININE 0.84  --  0.75 0.70 0.75 0.66  CALCIUM 8.8*  --  7.9* 7.4* 7.6* 7.9*  MG  --  1.7  --  1.9 2.0  --   PHOS  --  2.6  --   --   --   --    Liver Function Tests: Recent Labs  Lab 06/18/19 2338 06/20/19 0620  AST 16 12*  ALT 13 11  ALKPHOS 55 41  BILITOT 0.5 0.4  PROT 7.0 5.1*  ALBUMIN 3.8 2.8*   Recent Labs  Lab 06/18/19 2338  LIPASE 27   CBC: Recent Labs  Lab 06/18/19 2338 06/19/19 0515 06/19/19 0810 06/20/19 0620 06/21/19 0608 06/22/19 0605 06/23/19 0817  WBC 12.8*  10.6* 11.2* 7.5 7.2 8.4 7.9  NEUTROABS 8.3* 8.2* 7.8* 3.6  --   --   --   HGB 12.7 11.4* 11.3* 9.6* 9.6* 9.6* 11.0*  HCT 38.7 34.5* 34.9* 29.9* 29.3* 29.8* 33.6*  MCV 99.5 100.3* 100.0 102.7* 102.4* 100.3* 100.9*  PLT 223 203 213 163 150 173 195    Signed:  Barton Dubois MD.  [facility] 06/23/2019, 1:56 PM

## 2019-06-24 LAB — CULTURE, BLOOD (ROUTINE X 2)
Culture: NO GROWTH
Culture: NO GROWTH
Special Requests: ADEQUATE
Special Requests: ADEQUATE

## 2019-06-26 ENCOUNTER — Encounter (INDEPENDENT_AMBULATORY_CARE_PROVIDER_SITE_OTHER): Payer: Self-pay

## 2019-06-29 ENCOUNTER — Encounter (INDEPENDENT_AMBULATORY_CARE_PROVIDER_SITE_OTHER): Payer: Self-pay | Admitting: Internal Medicine

## 2019-06-29 ENCOUNTER — Other Ambulatory Visit (INDEPENDENT_AMBULATORY_CARE_PROVIDER_SITE_OTHER): Payer: Self-pay | Admitting: *Deleted

## 2019-06-29 ENCOUNTER — Other Ambulatory Visit: Payer: Self-pay

## 2019-06-29 ENCOUNTER — Ambulatory Visit (INDEPENDENT_AMBULATORY_CARE_PROVIDER_SITE_OTHER): Payer: BC Managed Care – PPO | Admitting: Internal Medicine

## 2019-06-29 ENCOUNTER — Encounter (INDEPENDENT_AMBULATORY_CARE_PROVIDER_SITE_OTHER): Payer: Self-pay | Admitting: *Deleted

## 2019-06-29 DIAGNOSIS — D649 Anemia, unspecified: Secondary | ICD-10-CM

## 2019-06-29 DIAGNOSIS — K529 Noninfective gastroenteritis and colitis, unspecified: Secondary | ICD-10-CM | POA: Diagnosis not present

## 2019-06-29 NOTE — Progress Notes (Signed)
Presenting complaint;  Follow for acute enteritis.  Database and subjective:  Patient is 53 year old Caucasian female who was admitted to [facility] on 06/18/2019 with abdominal pain and was diagnosed with acute enteritis.  She had marked inflammatory changes to her proximal jejunum and proximal ileum.  GI pathogen panel was negative.  She she was empirically was treated with IV Cipro and Flagyl.  Her sed rate was normal.  ANA was negative.  CRP was mildly elevated.  She had slow recovery.  She had a follow-up CT on the day of discharge which was on 06/23/2019 and it revealed significant rapid resolution of inflammatory changes.  Her CRP had dropped from 2.2 to 1.2.    Patient states she is feeling better.  She is still having abdominal pain and nausea.  She states on the evening of 06/26/2019 she developed epigastric pain and nausea after eating cucumbers and onions.  Since then she has been watching her diet and doing better.  Now she rates her pain at a score of 3.  She is having 4-6 bowel movements per day.  Stools are mushy but she denies melena or rectal bleeding.  She has chronic constipation and has a bowel movement every 7 to 10 days.  She has not experienced vomiting fever or chills.  She reports rash at right leg has improved with topical steroid.  Current Medications: Outpatient Encounter Medications as of 06/29/2019  Medication Sig  . acetaminophen (TYLENOL) 325 MG tablet Take 2 tablets (650 mg total) by mouth every 6 (six) hours as needed for mild pain, fever or headache.  Marland Kitchen aspirin-acetaminophen-caffeine (EXCEDRIN MIGRAINE) 250-250-65 MG tablet Take 2 tablets by mouth every 6 (six) hours as needed for headache or migraine.   Marland Kitchen augmented betamethasone dipropionate (DIPROLENE-AF) 0.05 % cream Apply 1 application topically at bedtime.   . Cholecalciferol (VITAMIN D3) 125 MCG (5000 UT) CAPS Take 5,000 Units by mouth daily.   . cyanocobalamin (,VITAMIN B-12,) 1000 MCG/ML injection Inject 1 mL  intramuscularly daily for 1 week; weekly for 1 month and then monthly after that.  . estradiol (ESTRACE) 2 MG tablet Take 1 tablet (2 mg total) by mouth daily.  Marland Kitchen gabapentin (NEURONTIN) 400 MG capsule Take 1 capsule (400 mg total) by mouth at bedtime.  Marland Kitchen lisinopril (ZESTRIL) 5 MG tablet Take 1 tablet (5 mg total) by mouth daily.  . ondansetron (ZOFRAN ODT) 8 MG disintegrating tablet Take 1 tablet (8 mg total) by mouth every 8 (eight) hours as needed for nausea or vomiting.  . pantoprazole (PROTONIX) 40 MG tablet Take 1 tablet (40 mg total) by mouth 2 (two) times daily.  . [DISCONTINUED] escitalopram (LEXAPRO) 10 MG tablet Take 10 mg by mouth daily.   No facility-administered encounter medications on file as of 06/29/2019.      Objective: Blood pressure 128/83, pulse 89, temperature 98.6 F (37 C), temperature source Oral, resp. rate 18, height 5' 3"  (1.6 m), weight 174 lb 11.2 oz (79.2 kg). Patient is alert and in no acute distress. Conjunctiva is pink. Sclera is nonicteric Oropharyngeal mucosa is normal. No neck masses or thyromegaly noted. Cardiac exam with regular rhythm normal S1 and S2. No murmur or gallop noted. Lungs are clear to auscultation. Abdomen is full.  Bowel sounds are normal.  On palpation abdomen soft.  She has mild to moderate tenderness in epigastric region to the left of midline.  No organomegaly or masses. No LE edema or clubbing noted. Maculopapular rash noted over right lateral malleolus.  It  has improved since it was last examined.  Labs/studies Results:  CBC Latest Ref Rng & Units 06/23/2019 06/22/2019 06/21/2019  WBC 4.0 - 10.5 K/uL 7.9 8.4 7.2  Hemoglobin 12.0 - 15.0 g/dL 11.0(L) 9.6(L) 9.6(L)  Hematocrit 36.0 - 46.0 % 33.6(L) 29.8(L) 29.3(L)  Platelets 150 - 400 K/uL 195 173 150    CMP Latest Ref Rng & Units 06/22/2019 06/21/2019 06/20/2019  Glucose 70 - 99 mg/dL 113(H) 91 88  BUN 6 - 20 mg/dL 6 7 9   Creatinine 0.44 - 1.00 mg/dL 0.66 0.75 0.70  Sodium 135 -  145 mmol/L 139 138 139  Potassium 3.5 - 5.1 mmol/L 3.5 3.6 3.5  Chloride 98 - 111 mmol/L 107 108 109  CO2 22 - 32 mmol/L 24 25 25   Calcium 8.9 - 10.3 mg/dL 7.9(L) 7.6(L) 7.4(L)  Total Protein 6.5 - 8.1 g/dL - - 5.1(L)  Total Bilirubin 0.3 - 1.2 mg/dL - - 0.4  Alkaline Phos 38 - 126 U/L - - 41  AST 15 - 41 U/L - - 12(L)  ALT 0 - 44 U/L - - 11    Hepatic Function Latest Ref Rng & Units 06/20/2019 06/18/2019 10/16/2018  Total Protein 6.5 - 8.1 g/dL 5.1(L) 7.0 6.9  Albumin 3.5 - 5.0 g/dL 2.8(L) 3.8 3.6  AST 15 - 41 U/L 12(L) 16 20  ALT 0 - 44 U/L 11 13 13   Alk Phosphatase 38 - 126 U/L 41 55 57  Total Bilirubin 0.3 - 1.2 mg/dL 0.4 0.5 0.4  Bilirubin, Direct - - - -    Lab Results  Component Value Date   CRP 1.2 (H) 06/23/2019      Assessment:  #1.  Acute enteritis felt to be most likely due to infection.  She does not have any risk factors for ischemic injury.  She did have follow-up CT last week and it showed significant improvement inflammatory changes.  I do not feel she needs further follow-up testing unless symptoms were to regress.  Should she experience another episode she will need further evaluation.  #2.  Mild anemia secondary to acute illness and possibly GI blood loss from enteritis.  Her stool was guaiac positive in the hospital although she did not have frank bleeding.  #3. history of GERD.  She will continue double dose PPI until acute symptoms have resolved.  Plan:  Patient will gradually increase intake of fiber rich foods. She will have CBC with differential and CRP in 1 week. Hopefully she will be able to return to work next week.  Decision will be made when blood work is reviewed. Office visit in 3 months.

## 2019-06-29 NOTE — Patient Instructions (Signed)
Increase intake of fiber rich foods gradually as tolerated. Physician will call with results of blood tests when completed.

## 2019-06-30 DIAGNOSIS — E538 Deficiency of other specified B group vitamins: Secondary | ICD-10-CM | POA: Diagnosis not present

## 2019-06-30 DIAGNOSIS — K529 Noninfective gastroenteritis and colitis, unspecified: Secondary | ICD-10-CM | POA: Diagnosis not present

## 2019-07-06 DIAGNOSIS — K529 Noninfective gastroenteritis and colitis, unspecified: Secondary | ICD-10-CM | POA: Diagnosis not present

## 2019-07-06 DIAGNOSIS — D649 Anemia, unspecified: Secondary | ICD-10-CM | POA: Diagnosis not present

## 2019-07-07 ENCOUNTER — Other Ambulatory Visit (INDEPENDENT_AMBULATORY_CARE_PROVIDER_SITE_OTHER): Payer: Self-pay | Admitting: *Deleted

## 2019-07-07 DIAGNOSIS — R7982 Elevated C-reactive protein (CRP): Secondary | ICD-10-CM

## 2019-07-07 LAB — CBC WITH DIFFERENTIAL/PLATELET
Absolute Monocytes: 568 cells/uL (ref 200–950)
Basophils Absolute: 50 cells/uL (ref 0–200)
Basophils Relative: 0.7 %
Eosinophils Absolute: 149 cells/uL (ref 15–500)
Eosinophils Relative: 2.1 %
HCT: 36.8 % (ref 35.0–45.0)
Hemoglobin: 12.2 g/dL (ref 11.7–15.5)
Lymphs Abs: 3451 cells/uL (ref 850–3900)
MCH: 32.6 pg (ref 27.0–33.0)
MCHC: 33.2 g/dL (ref 32.0–36.0)
MCV: 98.4 fL (ref 80.0–100.0)
MPV: 9.8 fL (ref 7.5–12.5)
Monocytes Relative: 8 %
Neutro Abs: 2883 cells/uL (ref 1500–7800)
Neutrophils Relative %: 40.6 %
Platelets: 232 10*3/uL (ref 140–400)
RBC: 3.74 10*6/uL — ABNORMAL LOW (ref 3.80–5.10)
RDW: 12.5 % (ref 11.0–15.0)
Total Lymphocyte: 48.6 %
WBC: 7.1 10*3/uL (ref 3.8–10.8)

## 2019-07-07 LAB — C-REACTIVE PROTEIN: CRP: 12.9 mg/L — ABNORMAL HIGH (ref <8.0)

## 2019-07-21 ENCOUNTER — Encounter (INDEPENDENT_AMBULATORY_CARE_PROVIDER_SITE_OTHER): Payer: Self-pay

## 2019-07-29 ENCOUNTER — Other Ambulatory Visit (INDEPENDENT_AMBULATORY_CARE_PROVIDER_SITE_OTHER): Payer: Self-pay | Admitting: *Deleted

## 2019-07-29 MED ORDER — DICYCLOMINE HCL 10 MG PO CAPS: 10.0000 mg | ORAL_CAPSULE | Freq: Three times a day (TID) | ORAL | 0 refills | Status: DC

## 2019-08-20 ENCOUNTER — Ambulatory Visit: Payer: BC Managed Care – PPO | Admitting: Cardiovascular Disease

## 2019-08-21 ENCOUNTER — Other Ambulatory Visit (INDEPENDENT_AMBULATORY_CARE_PROVIDER_SITE_OTHER): Payer: Self-pay | Admitting: Internal Medicine

## 2019-09-09 ENCOUNTER — Other Ambulatory Visit: Payer: Self-pay | Admitting: *Deleted

## 2019-09-09 DIAGNOSIS — Z20822 Contact with and (suspected) exposure to covid-19: Secondary | ICD-10-CM

## 2019-09-09 DIAGNOSIS — R6889 Other general symptoms and signs: Secondary | ICD-10-CM | POA: Diagnosis not present

## 2019-09-10 LAB — NOVEL CORONAVIRUS, NAA: SARS-CoV-2, NAA: NOT DETECTED

## 2019-09-21 ENCOUNTER — Encounter (INDEPENDENT_AMBULATORY_CARE_PROVIDER_SITE_OTHER): Payer: Self-pay

## 2019-09-28 ENCOUNTER — Other Ambulatory Visit (INDEPENDENT_AMBULATORY_CARE_PROVIDER_SITE_OTHER): Payer: Self-pay | Admitting: *Deleted

## 2019-09-28 ENCOUNTER — Other Ambulatory Visit (INDEPENDENT_AMBULATORY_CARE_PROVIDER_SITE_OTHER): Payer: Self-pay | Admitting: Internal Medicine

## 2019-09-28 ENCOUNTER — Telehealth (INDEPENDENT_AMBULATORY_CARE_PROVIDER_SITE_OTHER): Payer: Self-pay | Admitting: *Deleted

## 2019-09-28 DIAGNOSIS — K529 Noninfective gastroenteritis and colitis, unspecified: Secondary | ICD-10-CM

## 2019-09-28 DIAGNOSIS — D649 Anemia, unspecified: Secondary | ICD-10-CM

## 2019-09-28 DIAGNOSIS — R7982 Elevated C-reactive protein (CRP): Secondary | ICD-10-CM

## 2019-09-28 MED ORDER — OXYCODONE HCL 5 MG PO TABS: 5.0000 mg | ORAL_TABLET | Freq: Four times a day (QID) | ORAL | 0 refills | Status: DC | PRN

## 2019-09-28 NOTE — Telephone Encounter (Signed)
Patient is in bad pain . The same area as before, it feels bigger and worse. Nausea is bad , cant eat. She has appointment on 10/05/2019. Request refill for Oxycodone 1 po every 8 hours for severe pain. The pharmacy is [facility] in [place].

## 2019-09-28 NOTE — Telephone Encounter (Signed)
Labs have been ordered. Patient will be out of town tomorrow with her Dad at [facility]. She may be reached on Wednesday.Dr.Rehman would like for the CT to be done prior to her OV 10/05/2019.

## 2019-09-28 NOTE — Telephone Encounter (Signed)
Prescription for oxycodone 5 mg every 6 as needed 28 doses sent to patient's pharmacy. Since patient is not getting better she needs abdominal pelvic CT with contrast prior to her visit. Recent follow-up enteritis.  Persistent symptoms. Patient also needs CBC with differential, CRP and CMET

## 2019-09-29 ENCOUNTER — Other Ambulatory Visit (INDEPENDENT_AMBULATORY_CARE_PROVIDER_SITE_OTHER): Payer: Self-pay | Admitting: Internal Medicine

## 2019-09-29 ENCOUNTER — Encounter (INDEPENDENT_AMBULATORY_CARE_PROVIDER_SITE_OTHER): Payer: Self-pay

## 2019-09-29 DIAGNOSIS — R112 Nausea with vomiting, unspecified: Secondary | ICD-10-CM

## 2019-09-29 DIAGNOSIS — K529 Noninfective gastroenteritis and colitis, unspecified: Secondary | ICD-10-CM

## 2019-09-29 DIAGNOSIS — R1013 Epigastric pain: Secondary | ICD-10-CM

## 2019-09-29 NOTE — Telephone Encounter (Signed)
CT sch'd 10/01/19, sent MyChart message to patient

## 2019-09-30 DIAGNOSIS — D649 Anemia, unspecified: Secondary | ICD-10-CM | POA: Diagnosis not present

## 2019-09-30 DIAGNOSIS — R7982 Elevated C-reactive protein (CRP): Secondary | ICD-10-CM | POA: Diagnosis not present

## 2019-09-30 DIAGNOSIS — K529 Noninfective gastroenteritis and colitis, unspecified: Secondary | ICD-10-CM | POA: Diagnosis not present

## 2019-10-01 ENCOUNTER — Ambulatory Visit (HOSPITAL_COMMUNITY)
Admission: RE | Admit: 2019-10-01 | Discharge: 2019-10-01 | Disposition: A | Discharge status: Home / Self Care | Payer: BC Managed Care – PPO | Source: Ambulatory Visit | Attending: Internal Medicine | Admitting: Internal Medicine

## 2019-10-01 ENCOUNTER — Other Ambulatory Visit: Payer: Self-pay

## 2019-10-01 DIAGNOSIS — R112 Nausea with vomiting, unspecified: Secondary | ICD-10-CM | POA: Diagnosis not present

## 2019-10-01 DIAGNOSIS — R1013 Epigastric pain: Secondary | ICD-10-CM | POA: Diagnosis not present

## 2019-10-01 DIAGNOSIS — K529 Noninfective gastroenteritis and colitis, unspecified: Secondary | ICD-10-CM | POA: Diagnosis not present

## 2019-10-01 DIAGNOSIS — R1110 Vomiting, unspecified: Secondary | ICD-10-CM | POA: Diagnosis not present

## 2019-10-01 LAB — CBC WITH DIFFERENTIAL/PLATELET
Absolute Monocytes: 510 cells/uL (ref 200–950)
Basophils Absolute: 38 cells/uL (ref 0–200)
Basophils Relative: 0.5 %
Eosinophils Absolute: 173 cells/uL (ref 15–500)
Eosinophils Relative: 2.3 %
HCT: 36 % (ref 35.0–45.0)
Hemoglobin: 12.1 g/dL (ref 11.7–15.5)
Lymphs Abs: 2910 cells/uL (ref 850–3900)
MCH: 33 pg (ref 27.0–33.0)
MCHC: 33.6 g/dL (ref 32.0–36.0)
MCV: 98.1 fL (ref 80.0–100.0)
MPV: 10 fL (ref 7.5–12.5)
Monocytes Relative: 6.8 %
Neutro Abs: 3870 cells/uL (ref 1500–7800)
Neutrophils Relative %: 51.6 %
Platelets: 215 10*3/uL (ref 140–400)
RBC: 3.67 10*6/uL — ABNORMAL LOW (ref 3.80–5.10)
RDW: 13.1 % (ref 11.0–15.0)
Total Lymphocyte: 38.8 %
WBC: 7.5 10*3/uL (ref 3.8–10.8)

## 2019-10-01 LAB — COMPREHENSIVE METABOLIC PANEL
AG Ratio: 1.4 (calc) (ref 1.0–2.5)
ALT: 7 U/L (ref 6–29)
AST: 11 U/L (ref 10–35)
Albumin: 3.7 g/dL (ref 3.6–5.1)
Alkaline phosphatase (APISO): 58 U/L (ref 37–153)
BUN: 10 mg/dL (ref 7–25)
CO2: 27 mmol/L (ref 20–32)
Calcium: 9.2 mg/dL (ref 8.6–10.4)
Chloride: 105 mmol/L (ref 98–110)
Creat: 0.68 mg/dL (ref 0.50–1.05)
Globulin: 2.6 g/dL (calc) (ref 1.9–3.7)
Glucose, Bld: 86 mg/dL (ref 65–99)
Potassium: 4.5 mmol/L (ref 3.5–5.3)
Sodium: 139 mmol/L (ref 135–146)
Total Bilirubin: 0.4 mg/dL (ref 0.2–1.2)
Total Protein: 6.3 g/dL (ref 6.1–8.1)

## 2019-10-01 LAB — C-REACTIVE PROTEIN: CRP: 15.4 mg/L — ABNORMAL HIGH (ref <8.0)

## 2019-10-01 MED ORDER — IOHEXOL 300 MG/ML  SOLN
100.0000 mL | Freq: Once | INTRAMUSCULAR | Status: AC | PRN
  Administered 2019-10-01: 08:00:00 100 mL via INTRAVENOUS

## 2019-10-05 ENCOUNTER — Encounter (INDEPENDENT_AMBULATORY_CARE_PROVIDER_SITE_OTHER): Payer: Self-pay | Admitting: Internal Medicine

## 2019-10-05 ENCOUNTER — Other Ambulatory Visit: Payer: Self-pay

## 2019-10-05 ENCOUNTER — Ambulatory Visit (INDEPENDENT_AMBULATORY_CARE_PROVIDER_SITE_OTHER): Payer: BC Managed Care – PPO | Admitting: Internal Medicine

## 2019-10-05 VITALS — BP 126/88 | HR 72 | Temp 98.5°F | Ht 63.0 in | Wt 175.1 lb

## 2019-10-05 DIAGNOSIS — K219 Gastro-esophageal reflux disease without esophagitis: Secondary | ICD-10-CM | POA: Diagnosis not present

## 2019-10-05 DIAGNOSIS — R7982 Elevated C-reactive protein (CRP): Secondary | ICD-10-CM | POA: Diagnosis not present

## 2019-10-05 DIAGNOSIS — R1013 Epigastric pain: Secondary | ICD-10-CM | POA: Diagnosis not present

## 2019-10-05 MED ORDER — DICYCLOMINE HCL 20 MG PO TABS: 20.0000 mg | ORAL_TABLET | Freq: Two times a day (BID) | ORAL | 3 refills | Status: DC

## 2019-10-05 NOTE — Progress Notes (Signed)
Presenting complaint;  Epigastric pain. Recent hospitalization for acute enteritis/jejunitis  Database and subjective:  Patient is 53 year old Caucasian female with history of GERD, rheumatoid arthritis on no medications as well as history of chronic pelvic pain who was admitted in July 2019 for acute jejunitis/enteritis.  GI pathogen panel was negative.  She responded to empiric antibiotic therapy.  Patient was seen in the office 3 months ago and was still having intermittent symptoms.  I felt that she was slowly getting better.  Patient called office last week stating that she was having episodic pain in epigastric region which was quite intense and preventing her from eating.  She was sent to the lab for blood work and she had follow-up CT on 10/01/2019 results of which I reviewed with patient on 10/02/2019.  Patient continues to experience epigastric pain which occurs within 30 to 60 minutes of her meal.  She has nausea but no vomiting.  When she has pain she is unable to eat.  She does not have vomiting fever or chills.  She says she is having 1 formed stool daily.  She denies melena or rectal bleeding.  She rarely takes Excedrin Migraine.  She has not lost any weight since her last visit despite her not eating well. Patient says she takes gabapentin for pelvic vaginal pain which resulted bladder surgery for urinary incontinence.  The surgery was done 10 years ago.  She had sling removed in 2011.  She says she has had 4-5 nerve blocks which have helped. She does not smoke cigarettes or drink alcohol. She is status post cholecystectomy.  Current Medications: Outpatient Encounter Medications as of 10/05/2019  Medication Sig  . acetaminophen (TYLENOL) 325 MG tablet Take 2 tablets (650 mg total) by mouth every 6 (six) hours as needed for mild pain, fever or headache.  Marland Kitchen aspirin-acetaminophen-caffeine (EXCEDRIN MIGRAINE) 250-250-65 MG tablet Take 2 tablets by mouth every 6 (six) hours as needed  for headache or migraine.   Marland Kitchen augmented betamethasone dipropionate (DIPROLENE-AF) 0.05 % cream Apply 1 application topically at bedtime.   . Cholecalciferol (VITAMIN D3) 125 MCG (5000 UT) CAPS Take 5,000 Units by mouth daily.   . cyanocobalamin (,VITAMIN B-12,) 1000 MCG/ML injection Inject 1 mL intramuscularly daily for 1 week; weekly for 1 month and then monthly after that. (Patient taking differently: Inject 1,000 mcg into the skin every 30 (thirty) days. Inject 1 mL intramuscularly daily for 1 week; weekly for 1 month and then monthly after that.)  . estradiol (ESTRACE) 2 MG tablet Take 1 tablet (2 mg total) by mouth daily.  Marland Kitchen gabapentin (NEURONTIN) 400 MG capsule Take 1 capsule (400 mg total) by mouth at bedtime.  Marland Kitchen lisinopril (ZESTRIL) 5 MG tablet Take 1 tablet (5 mg total) by mouth daily.  . ondansetron (ZOFRAN ODT) 8 MG disintegrating tablet Take 1 tablet (8 mg total) by mouth every 8 (eight) hours as needed for nausea or vomiting.  Marland Kitchen oxyCODONE (ROXICODONE) 5 MG immediate release tablet Take 1 tablet (5 mg total) by mouth every 6 (six) hours as needed for severe pain.  . pantoprazole (PROTONIX) 40 MG tablet Take 1 tablet (40 mg total) by mouth daily before breakfast.  . [DISCONTINUED] dicyclomine (BENTYL) 10 MG capsule TAKE 1 CAPSULE (10 MG TOTAL) BY MOUTH 3 (THREE) TIMES DAILY BEFORE MEALS.  . [DISCONTINUED] pantoprazole (PROTONIX) 40 MG tablet Take 1 tablet (40 mg total) by mouth 2 (two) times daily.  Marland Kitchen dicyclomine (BENTYL) 20 MG tablet Take 1 tablet (20 mg total)  by mouth 2 (two) times daily before a meal.   No facility-administered encounter medications on file as of 10/05/2019.      Objective: Blood pressure 126/88, pulse 72, temperature 98.5 F (36.9 C), temperature source Oral, height _0  (1.6 m), weight 175 lb 1.6 oz (79.4 kg). Patient is alert and in no acute distress. She is wearing facial mask. Conjunctiva is pink. Sclera is nonicteric Oropharyngeal mucosa is normal. No  neck masses or thyromegaly noted. Cardiac exam with regular rhythm normal S1 and S2. No murmur or gallop noted. Lungs are clear to auscultation. Abdomen is symmetrical.  Bowel sounds are normal.  No bruit noted.  She has laparoscopy scars over upper abdomen.  She has superficial tenderness in epigastric region around the laparoscopy scar.  She also has tenderness on deep palpation in epigastric region.  No guarding or rebound.  No organomegaly or masses. No LE edema or clubbing noted.  Labs/studies Results:  CBC Latest Ref Rng & Units 09/30/2019 07/06/2019 06/23/2019  WBC 3.8 - 10.8 Thousand/uL 7.5 7.1 7.9  Hemoglobin 11.7 - 15.5 g/dL 12.1 12.2 11.0(L)  Hematocrit 35.0 - 45.0 % 36.0 36.8 33.6(L)  Platelets 140 - 400 Thousand/uL 215 232 195    CMP Latest Ref Rng & Units 09/30/2019 06/22/2019 06/21/2019  Glucose 65 - 99 mg/dL 86 113(H) 91  BUN 7 - 25 mg/dL _1 Creatinine 0.50 - 1.05 mg/dL 0.68 0.66 0.75  Sodium 135 - 146 mmol/L 139 139 138  Potassium 3.5 - 5.3 mmol/L 4.5 3.5 3.6  Chloride 98 - 110 mmol/L 105 107 108  CO2 20 - 32 mmol/L _2 Calcium 8.6 - 10.4 mg/dL 9.2 7.9(L) 7.6(L)  Total Protein 6.1 - 8.1 g/dL 6.3 - -  Total Bilirubin 0.2 - 1.2 mg/dL 0.4 - -  Alkaline Phos 38 - 126 U/L - - -  AST 10 - 35 U/L 11 - -  ALT 6 - 29 U/L 7 - -    Hepatic Function Latest Ref Rng & Units 09/30/2019 06/20/2019 06/18/2019  Total Protein 6.1 - 8.1 g/dL 6.3 5.1(L) 7.0  Albumin 3.5 - 5.0 g/dL - 2.8(L) 3.8  AST 10 - 35 U/L 11 12(L) 16  ALT 6 - 29 U/L _3 Alk Phosphatase 38 - 126 U/L - 41 55  Total Bilirubin 0.2 - 1.2 mg/dL 0.4 0.4 0.5  Bilirubin, Direct - - - -    Lab Results  Component Value Date   CRP 15.4 (H) 09/30/2019    I reviewed abdominal pelvic CT with Dr. Lavonia Dana on 10/01/2019.  Changes of jejunitis have resolved.  There may be some wall thickening to third part of the duodenum.  Assessment:  #1.  Recurrent epigastric pain.  Pain was felt to be due to acute  enteritis/jejunitis that she had in July 2020 for which she was hospitalized.  Etiology was felt to be infection.  She remains with postprandial pain and unable to eat.  Follow-up CT 4 days ago showed resolution of wall thickening to jejunum and there may be slight wall thickening to third part of the duodenum.  Since she has not fully recovered in 3 months 1 has to wonder about other etiologies such as autoimmune process.  I doubt that this is due to ACE inhibitor as I would not expect improvement while she is still taking lisinopril. Her LFTs are normal.  Pancreas appears unremarkable. She remains with mildly elevated CRP which may be related to  her rheumatoid arthritis.  #2.  GERD.  She is doing well with dose reduction and PPI. Marland Kitchen Plan:  Patient advised to take 20 mg of dicyclomine at least 30 minutes before lunch and 10 mg before evening meal. She will keep food and symptom diary for the next few weeks and call us with progress report. If symptoms persist will consider push enteroscopy with duodenal and jejunal biopsy. CBC with diff, CRP and sed rate in one month. Office visit in 3 months.

## 2019-10-05 NOTE — Patient Instructions (Addendum)
Please call office with progress report in 2 weeks. Blood work to be done prior to next visit in 4 weeks.

## 2019-10-06 ENCOUNTER — Encounter (INDEPENDENT_AMBULATORY_CARE_PROVIDER_SITE_OTHER): Payer: Self-pay

## 2019-10-07 ENCOUNTER — Telehealth (INDEPENDENT_AMBULATORY_CARE_PROVIDER_SITE_OTHER): Payer: Self-pay | Admitting: *Deleted

## 2019-10-07 NOTE — Telephone Encounter (Signed)
I don't know what happened but when I ate dinner last night at 6pm, about 10pm I was vomiting and serve pain where I showed you it hurt yesterday. I took a pain pill and a nausea pill and a hour later I was vomiting again. I don't know what happened. And this morning still in pain and sick on my stomach.  But I have to go to work today because we are short staffed. I'm just texting you this so I can keep you updated. Every time something like this happens I will send a message.

## 2019-10-08 ENCOUNTER — Other Ambulatory Visit (INDEPENDENT_AMBULATORY_CARE_PROVIDER_SITE_OTHER): Payer: Self-pay | Admitting: *Deleted

## 2019-10-08 DIAGNOSIS — R112 Nausea with vomiting, unspecified: Secondary | ICD-10-CM

## 2019-10-08 DIAGNOSIS — R7982 Elevated C-reactive protein (CRP): Secondary | ICD-10-CM

## 2019-10-08 DIAGNOSIS — R1013 Epigastric pain: Secondary | ICD-10-CM

## 2019-10-08 DIAGNOSIS — R1110 Vomiting, unspecified: Secondary | ICD-10-CM | POA: Insufficient documentation

## 2019-10-09 ENCOUNTER — Other Ambulatory Visit: Payer: Self-pay

## 2019-10-09 ENCOUNTER — Other Ambulatory Visit (HOSPITAL_COMMUNITY)
Admission: RE | Admit: 2019-10-09 | Discharge: 2019-10-09 | Disposition: A | Discharge status: Home / Self Care | Payer: BC Managed Care – PPO | Source: Ambulatory Visit | Attending: Internal Medicine | Admitting: Internal Medicine

## 2019-10-09 DIAGNOSIS — Z20828 Contact with and (suspected) exposure to other viral communicable diseases: Secondary | ICD-10-CM | POA: Insufficient documentation

## 2019-10-09 DIAGNOSIS — Z01812 Encounter for preprocedural laboratory examination: Secondary | ICD-10-CM | POA: Diagnosis not present

## 2019-10-09 LAB — SARS CORONAVIRUS 2 (TAT 6-24 HRS): SARS Coronavirus 2: NEGATIVE

## 2019-10-12 ENCOUNTER — Encounter (HOSPITAL_COMMUNITY): Admission: RE | Payer: Self-pay | Source: Home / Self Care

## 2019-10-12 ENCOUNTER — Ambulatory Visit (HOSPITAL_COMMUNITY)
Admission: RE | Admit: 2019-10-12 | Payer: BC Managed Care – PPO | Source: Home / Self Care | Admitting: Internal Medicine

## 2019-10-12 ENCOUNTER — Other Ambulatory Visit (INDEPENDENT_AMBULATORY_CARE_PROVIDER_SITE_OTHER): Payer: Self-pay | Admitting: *Deleted

## 2019-10-12 ENCOUNTER — Encounter (INDEPENDENT_AMBULATORY_CARE_PROVIDER_SITE_OTHER): Payer: Self-pay

## 2019-10-12 DIAGNOSIS — R112 Nausea with vomiting, unspecified: Secondary | ICD-10-CM

## 2019-10-12 DIAGNOSIS — R1013 Epigastric pain: Secondary | ICD-10-CM

## 2019-10-12 SURGERY — EGD (ESOPHAGOGASTRODUODENOSCOPY)
Anesthesia: Moderate Sedation

## 2019-10-12 NOTE — OR Nursing (Signed)
Dr. Laural Golden stated patient needs procedure done with Propofol. Patient aware. [name] at office notified.

## 2019-10-14 ENCOUNTER — Other Ambulatory Visit (HOSPITAL_COMMUNITY)
Admission: RE | Admit: 2019-10-14 | Discharge: 2019-10-14 | Disposition: A | Discharge status: Home / Self Care | Payer: BC Managed Care – PPO | Source: Ambulatory Visit | Attending: Internal Medicine | Admitting: Internal Medicine

## 2019-10-14 ENCOUNTER — Encounter (HOSPITAL_COMMUNITY)
Admission: RE | Admit: 2019-10-14 | Discharge: 2019-10-14 | Disposition: A | Discharge status: Home / Self Care | Payer: BC Managed Care – PPO | Source: Ambulatory Visit | Attending: Internal Medicine | Admitting: Internal Medicine

## 2019-10-14 ENCOUNTER — Other Ambulatory Visit: Payer: Self-pay

## 2019-10-14 DIAGNOSIS — Z20828 Contact with and (suspected) exposure to other viral communicable diseases: Secondary | ICD-10-CM | POA: Diagnosis not present

## 2019-10-14 DIAGNOSIS — Z01812 Encounter for preprocedural laboratory examination: Secondary | ICD-10-CM | POA: Diagnosis not present

## 2019-10-14 LAB — SARS CORONAVIRUS 2 (TAT 6-24 HRS): SARS Coronavirus 2: NEGATIVE

## 2019-10-16 ENCOUNTER — Ambulatory Visit (HOSPITAL_COMMUNITY)
Admission: RE | Admit: 2019-10-16 | Discharge: 2019-10-16 | Disposition: A | Discharge status: Home / Self Care | Payer: BC Managed Care – PPO | Attending: Internal Medicine | Admitting: Internal Medicine

## 2019-10-16 ENCOUNTER — Ambulatory Visit (HOSPITAL_COMMUNITY): Payer: BC Managed Care – PPO | Admitting: Anesthesiology

## 2019-10-16 ENCOUNTER — Encounter (HOSPITAL_COMMUNITY)
Admission: RE | Disposition: A | Discharge status: Home / Self Care | Payer: Self-pay | Source: Home / Self Care | Attending: Internal Medicine

## 2019-10-16 DIAGNOSIS — J45909 Unspecified asthma, uncomplicated: Secondary | ICD-10-CM | POA: Diagnosis not present

## 2019-10-16 DIAGNOSIS — R1010 Upper abdominal pain, unspecified: Secondary | ICD-10-CM | POA: Insufficient documentation

## 2019-10-16 DIAGNOSIS — Z91040 Latex allergy status: Secondary | ICD-10-CM | POA: Insufficient documentation

## 2019-10-16 DIAGNOSIS — M069 Rheumatoid arthritis, unspecified: Secondary | ICD-10-CM | POA: Insufficient documentation

## 2019-10-16 DIAGNOSIS — R1012 Left upper quadrant pain: Secondary | ICD-10-CM | POA: Diagnosis not present

## 2019-10-16 DIAGNOSIS — K228 Other specified diseases of esophagus: Secondary | ICD-10-CM | POA: Diagnosis not present

## 2019-10-16 DIAGNOSIS — Z7982 Long term (current) use of aspirin: Secondary | ICD-10-CM | POA: Diagnosis not present

## 2019-10-16 DIAGNOSIS — R933 Abnormal findings on diagnostic imaging of other parts of digestive tract: Secondary | ICD-10-CM | POA: Diagnosis not present

## 2019-10-16 DIAGNOSIS — K319 Disease of stomach and duodenum, unspecified: Secondary | ICD-10-CM | POA: Insufficient documentation

## 2019-10-16 DIAGNOSIS — K219 Gastro-esophageal reflux disease without esophagitis: Secondary | ICD-10-CM | POA: Diagnosis not present

## 2019-10-16 DIAGNOSIS — Z888 Allergy status to other drugs, medicaments and biological substances status: Secondary | ICD-10-CM | POA: Insufficient documentation

## 2019-10-16 DIAGNOSIS — K2970 Gastritis, unspecified, without bleeding: Secondary | ICD-10-CM | POA: Insufficient documentation

## 2019-10-16 DIAGNOSIS — Z8541 Personal history of malignant neoplasm of cervix uteri: Secondary | ICD-10-CM | POA: Diagnosis not present

## 2019-10-16 DIAGNOSIS — K449 Diaphragmatic hernia without obstruction or gangrene: Secondary | ICD-10-CM | POA: Diagnosis not present

## 2019-10-16 DIAGNOSIS — Z79899 Other long term (current) drug therapy: Secondary | ICD-10-CM | POA: Insufficient documentation

## 2019-10-16 DIAGNOSIS — Z885 Allergy status to narcotic agent status: Secondary | ICD-10-CM | POA: Insufficient documentation

## 2019-10-16 DIAGNOSIS — M797 Fibromyalgia: Secondary | ICD-10-CM | POA: Insufficient documentation

## 2019-10-16 DIAGNOSIS — R1013 Epigastric pain: Secondary | ICD-10-CM | POA: Diagnosis not present

## 2019-10-16 DIAGNOSIS — R112 Nausea with vomiting, unspecified: Secondary | ICD-10-CM | POA: Insufficient documentation

## 2019-10-16 DIAGNOSIS — R1110 Vomiting, unspecified: Secondary | ICD-10-CM | POA: Insufficient documentation

## 2019-10-16 DIAGNOSIS — K6389 Other specified diseases of intestine: Secondary | ICD-10-CM | POA: Diagnosis not present

## 2019-10-16 DIAGNOSIS — K3189 Other diseases of stomach and duodenum: Secondary | ICD-10-CM

## 2019-10-16 HISTORY — PX: BIOPSY: SHX5522

## 2019-10-16 HISTORY — PX: ESOPHAGOGASTRODUODENOSCOPY (EGD) WITH PROPOFOL: SHX5813

## 2019-10-16 HISTORY — PX: ENTEROSCOPY: SHX5533

## 2019-10-16 SURGERY — ESOPHAGOGASTRODUODENOSCOPY (EGD) WITH PROPOFOL
Anesthesia: General

## 2019-10-16 MED ORDER — PROPOFOL 10 MG/ML IV BOLUS
INTRAVENOUS | Status: DC | PRN
  Administered 2019-10-16: 30 mg via INTRAVENOUS
  Administered 2019-10-16 (×2): 20 mg via INTRAVENOUS
  Administered 2019-10-16: 30 mg via INTRAVENOUS

## 2019-10-16 MED ORDER — LIDOCAINE HCL (CARDIAC) PF 100 MG/5ML IV SOSY
PREFILLED_SYRINGE | INTRAVENOUS | Status: DC | PRN
  Administered 2019-10-16: 50 mg via INTRATRACHEAL

## 2019-10-16 MED ORDER — PROMETHAZINE HCL 25 MG/ML IJ SOLN: 6.2500 mg | INTRAMUSCULAR | Status: DC | PRN

## 2019-10-16 MED ORDER — CHLORHEXIDINE GLUCONATE CLOTH 2 % EX PADS: 6.0000 | MEDICATED_PAD | Freq: Once | CUTANEOUS | Status: DC

## 2019-10-16 MED ORDER — ONDANSETRON 8 MG PO TBDP: 8.0000 mg | ORAL_TABLET | Freq: Three times a day (TID) | ORAL | 1 refills | Status: DC | PRN

## 2019-10-16 MED ORDER — PROPOFOL 10 MG/ML IV BOLUS
INTRAVENOUS | Status: AC
  Filled 2019-10-16: qty 40

## 2019-10-16 MED ORDER — GLYCOPYRROLATE 0.2 MG/ML IJ SOLN
INTRAMUSCULAR | Status: DC | PRN
  Administered 2019-10-16: 0.1 mg via INTRAVENOUS

## 2019-10-16 MED ORDER — KETAMINE HCL 10 MG/ML IJ SOLN
INTRAMUSCULAR | Status: DC | PRN
  Administered 2019-10-16: 20 mg via INTRAVENOUS
  Administered 2019-10-16: 10 mg via INTRAVENOUS

## 2019-10-16 MED ORDER — ONDANSETRON HCL 4 MG/2ML IJ SOLN
INTRAMUSCULAR | Status: DC | PRN
  Administered 2019-10-16: 4 mg via INTRAVENOUS

## 2019-10-16 MED ORDER — LACTATED RINGERS IV SOLN
INTRAVENOUS | Status: DC
  Administered 2019-10-16: 07:00:00 via INTRAVENOUS

## 2019-10-16 MED ORDER — KETAMINE HCL 50 MG/5ML IJ SOSY
PREFILLED_SYRINGE | INTRAMUSCULAR | Status: AC
  Filled 2019-10-16: qty 5

## 2019-10-16 MED ORDER — MIDAZOLAM HCL 2 MG/2ML IJ SOLN: 0.5000 mg | Freq: Once | INTRAMUSCULAR | Status: DC | PRN

## 2019-10-16 MED ORDER — PROPOFOL 500 MG/50ML IV EMUL
INTRAVENOUS | Status: DC | PRN
  Administered 2019-10-16: 75 ug/kg/min via INTRAVENOUS

## 2019-10-16 NOTE — Transfer of Care (Signed)
Immediate Anesthesia Transfer of Care Note  Patient: [name]  Procedure(s) Performed: ESOPHAGOGASTRODUODENOSCOPY (EGD) WITH PROPOFOL (N/A ) PUSH ENTEROSCOPY (N/A ) DUODENUM and JEJUNUM BIOPSY (N/A )  Patient Location: PACU  Anesthesia Type:General  Level of Consciousness: awake and sedated  Airway & Oxygen Therapy: Patient Spontanous Breathing  Post-op Assessment: Report given to RN, Post -op Vital signs reviewed and stable and Patient moving all extremities  Post vital signs: Reviewed and stable  Last Vitals:  Vitals Value Taken Time  BP 136/79 10/16/19 0801  Temp    Pulse 70 10/16/19 0802  Resp 14 10/16/19 0802  SpO2 96 % 10/16/19 0802  Vitals shown include unvalidated device data.  Last Pain:  Vitals:   10/16/19 0744  TempSrc:   PainSc: 3       Patients Stated Pain Goal: 4 ([id number])  Complications: No apparent anesthesia complications

## 2019-10-16 NOTE — Discharge Instructions (Signed)
No aspirin or NSAIDs for 24 hours. Remember that Tylenol/acetaminophen dose should not exceed more than 3 g on any given day. Resume other medications and diet as before No driving for 24 hours. Physician will call with biopsy results and further recommendations.   Monitored Anesthesia Care, Care After These instructions provide you with information about caring for yourself after your procedure. Your health care provider may also give you more specific instructions. Your treatment has been planned according to current medical practices, but problems sometimes occur. Call your health care provider if you have any problems or questions after your procedure. What can I expect after the procedure? After your procedure, you may:  Feel sleepy for several hours.  Feel clumsy and have poor balance for several hours.  Feel forgetful about what happened after the procedure.  Have poor judgment for several hours.  Feel nauseous or vomit.  Have a sore throat if you had a breathing tube during the procedure. Follow these instructions at home: For at least 24 hours after the procedure:      Have a responsible adult stay with you. It is important to have someone help care for you until you are awake and alert.  Rest as needed.  Do not: ? Participate in activities in which you could fall or become injured. ? Drive. ? Use heavy machinery. ? Drink alcohol. ? Take sleeping pills or medicines that cause drowsiness. ? Make important decisions or sign legal documents. ? Take care of children on your own. Eating and drinking  Follow the diet that is recommended by your health care provider.  If you vomit, drink water, juice, or soup when you can drink without vomiting.  Make sure you have little or no nausea before eating solid foods. General instructions  Take over-the-counter and prescription medicines only as told by your health care provider.  If you have sleep apnea, surgery and  certain medicines can increase your risk for breathing problems. Follow instructions from your health care provider about wearing your sleep device: ? Anytime you are sleeping, including during daytime naps. ? While taking prescription pain medicines, sleeping medicines, or medicines that make you drowsy.  If you smoke, do not smoke without supervision.  Keep all follow-up visits as told by your health care provider. This is important. Contact a health care provider if:  You keep feeling nauseous or you keep vomiting.  You feel light-headed.  You develop a rash.  You have a fever. Get help right away if:  You have trouble breathing. Summary  For several hours after your procedure, you may feel sleepy and have poor judgment.  Have a responsible adult stay with you for at least 24 hours or until you are awake and alert. This information is not intended to replace advice given to you by your health care provider. Make sure you discuss any questions you have with your health care provider. Document Released: 03/18/2016 Document Revised: 02/24/2018 Document Reviewed: 03/18/2016 Elsevier Patient Education  [place].  Gastritis, Adult  Gastritis is swelling (inflammation) of the stomach. Gastritis can develop quickly (acute). It can also develop slowly over time (chronic). It is important to get help for this condition. If you do not get help, your stomach can bleed, and you can get sores (ulcers) in your stomach. What are the causes? This condition may be caused by:  Germs that get to your stomach.  Drinking too much alcohol.  Medicines you are taking.  Too much acid in  the stomach.  A disease of the intestines or stomach.  Stress.  An allergic reaction.  Crohn's disease.  Some cancer treatments (radiation). Sometimes the cause of this condition is not known. What are the signs or symptoms? Symptoms of this condition include:  Pain in your stomach.  A  burning feeling in your stomach.  Feeling sick to your stomach (nauseous).  Throwing up (vomiting).  Feeling too full after you eat.  Weight loss.  Bad breath.  Throwing up blood.  Blood in your poop (stool). How is this diagnosed? This condition may be diagnosed with:  Your medical history and symptoms.  A physical exam.  Tests. These can include: ? Blood tests. ? Stool tests. ? A procedure to look inside your stomach (upper endoscopy). ? A test in which a sample of tissue is taken for testing (biopsy). How is this treated? Treatment for this condition depends on what caused it. You may be given:  Antibiotic medicine, if your condition was caused by germs.  H2 blockers and similar medicines, if your condition was caused by too much acid. Follow these instructions at home: Medicines  Take over-the-counter and prescription medicines only as told by your doctor.  If you were prescribed an antibiotic medicine, take it as told by your doctor. Do not stop taking it even if you start to feel better. Eating and drinking   Eat small meals often, instead of large meals.  Avoid foods and drinks that make your symptoms worse.  Drink enough fluid to keep your pee (urine) pale yellow. Alcohol use  Do not drink alcohol if: ? Your doctor tells you not to drink. ? You are pregnant, may be pregnant, or are planning to become pregnant.  If you drink alcohol: ? Limit your use to:  0-1 drink a day for women.  0-2 drinks a day for men. ? Be aware of how much alcohol is in your drink. In the [place] one drink equals one 12 oz bottle of beer (355 mL), one 5 oz glass of wine (148 mL), or one 1 oz glass of hard liquor (44 mL). General instructions  Talk with your doctor about ways to manage stress. You can exercise or do deep breathing, meditation, or yoga.  Do not smoke or use products that have nicotine or tobacco. If you need help quitting, ask your doctor.  Keep all  follow-up visits as told by your doctor. This is important. Contact a doctor if:  Your symptoms get worse.  Your symptoms go away and then come back. Get help right away if:  You throw up blood or something that looks like coffee grounds.  You have black or dark red poop.  You throw up any time you try to drink fluids.  Your stomach pain gets worse.  You have a fever.  You do not feel better after one week. Summary  Gastritis is swelling (inflammation) of the stomach.  You must get help for this condition. If you do not get help, your stomach can bleed, and you can get sores (ulcers).  This condition is diagnosed with medical history, physical exam, or tests.  You can be treated with medicines for germs or medicines to block too much acid in your stomach. This information is not intended to replace advice given to you by your health care provider. Make sure you discuss any questions you have with your health care provider. Document Released: 05/14/2008 Document Revised: 04/15/2018 Document Reviewed: 04/15/2018 Elsevier Patient Education  2020  [employer]  Hiatal Hernia  A hiatal hernia occurs when part of the stomach slides above the muscle that separates the abdomen from the chest (diaphragm). A person can be born with a hiatal hernia (congenital), or it may develop over time. In almost all cases of hiatal hernia, only the top part of the stomach pushes through the diaphragm. Many people have a hiatal hernia with no symptoms. The larger the hernia, the more likely it is that you will have symptoms. In some cases, a hiatal hernia allows stomach acid to flow back into the tube that carries food from your mouth to your stomach (esophagus). This may cause heartburn symptoms. Severe heartburn symptoms may mean that you have developed a condition called gastroesophageal reflux disease (GERD). What are the causes? This condition is caused by a weakness in the opening (hiatus) where  the esophagus passes through the diaphragm to attach to the upper part of the stomach. A person may be born with a weakness in the hiatus, or a weakness can develop over time. What increases the risk? This condition is more likely to develop in:  Older people. Age is a major risk factor for a hiatal hernia, especially if you are over the age of 38.  Pregnant women.  People who are overweight.  People who have frequent constipation. What are the signs or symptoms? Symptoms of this condition usually develop in the form of GERD symptoms. Symptoms include:  Heartburn.  Belching.  Indigestion.  Trouble swallowing.  Coughing or wheezing.  Sore throat.  Hoarseness.  Chest pain.  Nausea and vomiting. How is this diagnosed? This condition may be diagnosed during testing for GERD. Tests that may be done include:  X-rays of your stomach or chest.  An upper gastrointestinal (GI) series. This is an X-ray exam of your GI tract that is taken after you swallow a chalky liquid that shows up clearly on the X-ray.  Endoscopy. This is a procedure to look into your stomach using a thin, flexible tube that has a tiny camera and light on the end of it. How is this treated? This condition may be treated by:  Dietary and lifestyle changes to help reduce GERD symptoms.  Medicines. These may include: ? Over-the-counter antacids. ? Medicines that make your stomach empty more quickly. ? Medicines that block the production of stomach acid (H2 blockers). ? Stronger medicines to reduce stomach acid (proton pump inhibitors).  Surgery to repair the hernia, if other treatments are not helping. If you have no symptoms, you may not need treatment. Follow these instructions at home: Lifestyle and activity  Do not use any products that contain nicotine or tobacco, such as cigarettes and e-cigarettes. If you need help quitting, ask your health care provider.  Try to achieve and maintain a healthy  body weight.  Avoid putting pressure on your abdomen. Anything that puts pressure on your abdomen increases the amount of acid that may be pushed up into your esophagus. ? Avoid bending over, especially after eating. ? Raise the head of your bed by putting blocks under the legs. This keeps your head and esophagus higher than your stomach. ? Do not wear tight clothing around your chest or stomach. ? Try not to strain when having a bowel movement, when urinating, or when lifting heavy objects. Eating and drinking  Avoid foods that can worsen GERD symptoms. These may include: ? Fatty foods, like fried foods. ? Citrus fruits, like oranges or lemon. ? Other foods and drinks  that contain acid, like orange juice or tomatoes. ? Spicy food. ? Chocolate.  Eat frequent small meals instead of three large meals a day. This helps prevent your stomach from getting too full. ? Eat slowly. ? Do not lie down right after eating. ? Do not eat 1-2 hours before bed.  Do not drink beverages with caffeine. These include cola, coffee, cocoa, and tea.  Do not drink alcohol. General instructions  Take over-the-counter and prescription medicines only as told by your health care provider.  Keep all follow-up visits as told by your health care provider. This is important. Contact a health care provider if:  Your symptoms are not controlled with medicines or lifestyle changes.  You are having trouble swallowing.  You have coughing or wheezing that will not go away. Get help right away if:  Your pain is getting worse.  Your pain spreads to your arms, neck, jaw, teeth, or back.  You have shortness of breath.  You sweat for no reason.  You feel sick to your stomach (nauseous) or you vomit.  You vomit blood.  You have bright red blood in your stools.  You have black, tarry stools. This information is not intended to replace advice given to you by your health care provider. Make sure you discuss any  questions you have with your health care provider. Document Released: 02/16/2004 Document Revised: 11/08/2017 Document Reviewed: 07/01/2017 Elsevier Patient Education  2020 [employer].

## 2019-10-16 NOTE — Anesthesia Postprocedure Evaluation (Signed)
Anesthesia Post Note  Patient: [name]  Procedure(s) Performed: ESOPHAGOGASTRODUODENOSCOPY (EGD) WITH PROPOFOL (N/A ) PUSH ENTEROSCOPY (N/A ) DUODENUM and JEJUNUM BIOPSY (N/A )  Patient location during evaluation: PACU Anesthesia Type: General Level of consciousness: awake Pain management: pain level controlled Vital Signs Assessment: post-procedure vital signs reviewed and stable Respiratory status: spontaneous breathing, nonlabored ventilation and respiratory function stable Cardiovascular status: stable Postop Assessment: no apparent nausea or vomiting Anesthetic complications: no     Last Vitals:  Vitals:   10/16/19 0649  BP: 116/77  Pulse: 68  Resp: 18  Temp: 36.8 C  SpO2: 97%    Last Pain:  Vitals:   10/16/19 0744  TempSrc:   PainSc: [place]

## 2019-10-16 NOTE — Op Note (Addendum)
[facility] Patient Name: [name] Procedure Date: 10/16/2019 7:58 AM MRN: [id number] Date of Birth: 1966-04-10 Attending MD: Hildred Laser , MD CSN: [id number] Age: 53 Admit Type: Outpatient Procedure:                Upper GI endoscopy and jejunoscopy Indications:              Epigastric abdominal pain, Abdominal pain in the                            left upper quadrant, Abnormal CT of the GI tract Providers:                Hildred Laser, MD, Janeece Riggers, RN Referring MD:             Asencion Noble, MD Medicines:                Propofol per Anesthesia Complications:            No immediate complications. Estimated Blood Loss:     Estimated blood loss was minimal. Procedure:                Pre-Anesthesia Assessment:                           - Prior to the procedure, a History and Physical                            was performed, and patient medications and                            allergies were reviewed. The patient's tolerance of                            previous anesthesia was also reviewed. The risks                            and benefits of the procedure and the sedation                            options and risks were discussed with the patient.                            All questions were answered, and informed consent                            was obtained. Prior Anticoagulants: The patient has                            taken no previous anticoagulant or antiplatelet                            agents except for aspirin. ASA Grade Assessment:                            III - A patient with severe systemic disease. After  reviewing the risks and benefits, the patient was                            deemed in satisfactory condition to undergo the                            procedure.                           After obtaining informed consent, the endoscope was                            passed under direct vision. Throughout the                    procedure, the patient's blood pressure, pulse, and                            oxygen saturations were monitored continuously. The                            [id number]) scope was introduced through                            the mouth, and advanced to the proximal jejunum.                            The upper GI endoscopy was accomplished without                            difficulty. The patient tolerated the procedure                            well. Findings:      The examined esophagus was normal.      The Z-line was irregular and was found 36 cm from the incisors.      A 2 cm hiatal hernia was present.      Patchy mild inflammation characterized by congestion (edema) and       erythema was found in the gastric antrum and in the prepyloric region of       the stomach. Biopsies were taken with a cold forceps for histology. The       pathology specimen was placed into Bottle Number 3.      The duodenal bulb, second portion of the duodenum and third portion of       the duodenum were normal.      Patchy mildly congested mucosa without active bleeding and with no       stigmata of bleeding was found in the fourth portion of the duodenum.       This was biopsied with a cold forceps for histology. The pathology       specimen was placed into Bottle Number 2.      Localized mildly congested mucosa without active bleeding and with no       stigmata of bleeding was found in the jejunum. This was biopsied with a       cold forceps for histology. The pathology specimen was  placed into       Bottle Number 1. Impression:               - Normal esophagus.                           - Z-line irregular, 36 cm from the incisors.                           - 2 cm hiatal hernia.                           - Gastritis. Biopsied.                           - Normal duodenal bulb, second portion of the                            duodenum and third portion of the duodenum.                            - Congested duodenal mucosa. Biopsied.                           - Congested (edematous) jejunum. Biopsied. Moderate Sedation:      Per Anesthesia Care Recommendation:           - Patient has a contact number available for                            emergencies. The signs and symptoms of potential                            delayed complications were discussed with the                            patient. Return to normal activities tomorrow.                            Written discharge instructions were provided to the                            patient.                           - Resume previous diet today.                           - Continue present medications.                           - No aspirin, ibuprofen, naproxen, or other                            non-steroidal anti-inflammatory drugs for 1 day.                           - Await  pathology results. Procedure Code(s):        --- Professional ---                           [id number], Esophagogastroduodenoscopy, flexible,                            transoral; with biopsy, single or multiple Diagnosis Code(s):        --- Professional ---                           K22.8, Other specified diseases of esophagus                           K44.9, Diaphragmatic hernia without obstruction or                            gangrene                           K29.70, Gastritis, unspecified, without bleeding                           K31.89, Other diseases of stomach and duodenum                           K63.89, Other specified diseases of intestine                           R10.13, Epigastric pain                           R10.12, Left upper quadrant pain                           R93.3, Abnormal findings on diagnostic imaging of                            other parts of digestive tract CPT copyright 2019 [employer]. All rights reserved. The codes documented in this report are preliminary and upon coder review  may  be revised to meet current compliance requirements. Hildred Laser, MD Hildred Laser, MD 10/16/2019 8:04:26 AM This report has been signed electronically. Number of Addenda: 0

## 2019-10-16 NOTE — H&P (Signed)
[name] is an 53 y.o. female.   Chief Complaint: Patient is here for esophagogastro duodenal jejunoscopy. HPI: Patient is 53 year old Caucasian female who was hospitalized in July with acute enteritis/jejunitis.  GI pathogen panel was negative.  She was empirically treated with antibiotics but has never fully recovered.  She had follow-up CT about 2 weeks ago which revealed thickening to fourth part of the duodenum.  Changes of jejunitis have resolved.  However patient remains with epigastric and left upper quadrant pain nausea and intermittent postprandial vomiting.  She is afraid to eat.  However she has not lost any weight.  She does not take any OTC NSAIDs.  She has been diagnosed with RA but does not take any medications.  She does have a rash above right ankle which has not changed in the last few months.  She has not been seen by dermatologist.  Past Medical History:  Diagnosis Date  . Asthma    triggered by weather changes; no inhaler  . Cancer ([place])    Cervical  . Collagen vascular disease ([place])   . Fibromyalgia   . Ganglion cyst of wrist 12/2012   left dorsal cyst  . GERD (gastroesophageal reflux disease)    TUMS as needed  . Headache(784.0)    tension/migraines  . PONV (postoperative nausea and vomiting)    also states stopped breathing after hernia surgery  . Rectal bleeding   . Rheumatoid arthritis ([place])   . Stenosing tenosynovitis of finger 12/2012   left middle finger  . 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 [facility])     Past Surgical History:  Procedure Laterality Date  . ABDOMINAL HYSTERECTOMY  1994  . APPENDECTOMY  1996  . BIOPSY  12/01/2018   Procedure: BIOPSY;  Surgeon: Rogene Houston, MD;  [facility];  Service: Endoscopy;;  gastric   . COLONOSCOPY N/A 05/08/2013   Procedure: COLONOSCOPY;  Surgeon: Rogene Houston, MD;  [facility];  Service: Endoscopy;  Laterality: N/A;  240  . COLONOSCOPY N/A 09/23/2015   Procedure: COLONOSCOPY;   Surgeon: Rogene Houston, MD;  [facility];  Service: Endoscopy;  Laterality: N/A;  825  . CYSTOSCOPY WITH HYDRODISTENSION AND BIOPSY  04/01/2007  . [place]   left tube and ovary removed  . ESOPHAGOGASTRODUODENOSCOPY N/A 12/01/2018   Procedure: ESOPHAGOGASTRODUODENOSCOPY (EGD);  Surgeon: Rogene Houston, MD;  [facility];  Service: Endoscopy;  Laterality: N/A;  12:45  . GANGLION CYST EXCISION  12/17/2012   Procedure: REMOVAL GANGLION OF WRIST;  Surgeon: Wynonia Sours, MD;  Location: [place];  Service: Orthopedics;  Laterality: Left;  . HARDWARE REMOVAL Right 08/09/2014   Procedure: Placement Right Ankle Tight Rope, Removal Plate and Screws;  Surgeon: Marybelle Killings, MD;  Location: [place];  Service: Orthopedics;  Laterality: Right;  . PUBOVAGINAL SLING  2010  . rt ankle surgery    . SALPINGOOPHORECTOMY  1996   right  . TRIGGER FINGER RELEASE  12/17/2012   Procedure: RELEASE TRIGGER FINGER/A-1 PULLEY;  Surgeon: Wynonia Sours, MD;  Location: [place];  Service: Orthopedics;  Laterality: Left;  . TUBAL LIGATION  1992  . VAGINA RECONSTRUCTION SURGERY     x 5 since 2010  . VENTRAL HERNIA REPAIR  09/06/2006    Family History  Problem Relation Age of Onset  . Lung cancer Mother   . Cervical cancer Mother   . Heart disease Father   .  Leukemia Father   . Cancer Maternal Grandmother        bladder and colon cancer   Social History:  reports that she has never smoked. She has never used smokeless tobacco. She reports that she does not drink alcohol or use drugs.  Allergies:  Allergies  Allergen Reactions  . Imitrex [Sumatriptan]     Causes a migraine  . Cymbalta [Duloxetine Hcl] Rash  . Latex Rash  . Propoxyphene N-Acetaminophen Nausea And Vomiting and Rash  . Tramadol Rash    Medications Prior to Admission  Medication Sig Dispense Refill  . acetaminophen (TYLENOL) 325 MG tablet Take 2 tablets (650 mg total) by  mouth every 6 (six) hours as needed for mild pain, fever or headache. 30 tablet 0  . aspirin-acetaminophen-caffeine (EXCEDRIN MIGRAINE) 250-250-65 MG tablet Take 2 tablets by mouth every 6 (six) hours as needed for headache or migraine.     Marland Kitchen augmented betamethasone dipropionate (DIPROLENE-AF) 0.05 % cream Apply 1 application topically at bedtime.   3  . Cholecalciferol (VITAMIN D3) 125 MCG (5000 UT) CAPS Take 5,000 Units by mouth daily.     . cyanocobalamin (,VITAMIN B-12,) 1000 MCG/ML injection Inject 1 mL intramuscularly daily for 1 week; weekly for 1 month and then monthly after that. (Patient taking differently: Inject 1,000 mcg into the skin every 30 (thirty) days. Inject 1 mL intramuscularly daily for 1 week; weekly for 1 month and then monthly after that.) 15 mL 0  . dicyclomine (BENTYL) 20 MG tablet Take 1 tablet (20 mg total) by mouth 2 (two) times daily before a meal. (Patient taking differently: Take 10 mg by mouth 4 (four) times daily -  before meals and at bedtime. ) 60 tablet 3  . estradiol (ESTRACE) 2 MG tablet Take 1 tablet (2 mg total) by mouth daily. 90 tablet 3  . gabapentin (NEURONTIN) 400 MG capsule Take 1 capsule (400 mg total) by mouth at bedtime. 90 capsule 0  . lisinopril (ZESTRIL) 5 MG tablet Take 1 tablet (5 mg total) by mouth daily. (Patient taking differently: Take 5 mg by mouth at bedtime. ) 90 tablet 3  . ondansetron (ZOFRAN ODT) 8 MG disintegrating tablet Take 1 tablet (8 mg total) by mouth every 8 (eight) hours as needed for nausea or vomiting. 20 tablet 0  . oxyCODONE (ROXICODONE) 5 MG immediate release tablet Take 1 tablet (5 mg total) by mouth every 6 (six) hours as needed for severe pain. 28 tablet 0  . pantoprazole (PROTONIX) 40 MG tablet Take 1 tablet (40 mg total) by mouth daily before breakfast. 90 tablet 3  . benzonatate (TESSALON) 200 MG capsule Take 200 mg by mouth as needed for cough.       No results found for this or any previous visit (from the past 48  hour(s)). No results found.  ROS  Blood pressure 116/77, pulse 68, temperature 98.2 F (36.8 C), temperature source Oral, resp. rate 18, height 5\' 3"  (1.6 m), weight 79.4 kg, SpO2 97 %. Physical Exam  Constitutional: She appears well-developed and well-nourished.  HENT:  Mouth/Throat: Oropharynx is clear and moist.  Eyes: Conjunctivae are normal. No scleral icterus.  Neck: No thyromegaly present.  Cardiovascular: Normal rate, regular rhythm and normal heart sounds.  No murmur heard. Respiratory: Effort normal and breath sounds normal.  GI:  Abdomen is full.  Bowel sounds are normal.  She has mild tenderness below the left costal margin and moderate midepigastric tenderness.  No organomegaly or masses  Musculoskeletal:  General: No edema.  Lymphadenopathy:    She has no cervical adenopathy.  Neurological: She is alert.  Skin: Skin is warm and dry.     Assessment/Plan Persistent epigastric and left upper quadrant abdominal pain in a patient with history of jejunitis in July 2020 who has not improved. Diagnostic EGD with examination of proximal jejunum with biopsies.  Hildred Laser, MD 10/16/2019, 7:19 AM

## 2019-10-16 NOTE — Anesthesia Preprocedure Evaluation (Signed)
Anesthesia Evaluation  Patient identified by MRN, date of birth, ID band Patient awake  General Assessment Comment:Requesting Zofran   Reviewed: Allergy & Precautions, NPO status , Patient's Chart, lab work & pertinent test results  History of Anesthesia Complications (+) PONV and history of anesthetic complications  Airway Mallampati: III  TM Distance: >3 FB Neck ROM: Full    Dental no notable dental hx. (+) Teeth Intact   Pulmonary asthma ,    Pulmonary exam normal breath sounds clear to auscultation       Cardiovascular Exercise Tolerance: Good negative cardio ROS Normal cardiovascular examI Rhythm:Regular Rate:Normal     Neuro/Psych  Headaches,  Neuromuscular disease negative psych ROS   GI/Hepatic Neg liver ROS, GERD  Medicated and Controlled,  Endo/Other  negative endocrine ROS  Renal/GU negative Renal ROS  negative genitourinary   Musculoskeletal  (+) Arthritis , Rheumatoid disorders,  Fibromyalgia -, narcotic dependent  Abdominal   Peds negative pediatric ROS (+)  Hematology negative hematology ROS (+) anemia ,   Anesthesia Other Findings   Reproductive/Obstetrics negative OB ROS                             Anesthesia Physical Anesthesia Plan  ASA: III  Anesthesia Plan: General   Post-op Pain Management:    Induction: Intravenous  PONV Risk Score and Plan: 4 or greater and Ondansetron, Dexamethasone, Treatment may vary due to age or medical condition, Propofol infusion and TIVA  Airway Management Planned: Nasal Cannula and Simple Face Mask  Additional Equipment:   Intra-op Plan:   Post-operative Plan:   Informed Consent: I have reviewed the patients History and Physical, chart, labs and discussed the procedure including the risks, benefits and alternatives for the proposed anesthesia with the patient or authorized representative who has indicated his/her  understanding and acceptance.     Dental advisory given  Plan Discussed with: CRNA  Anesthesia Plan Comments: (Plan Full PPE use  Plan GA with GETA as needed d/w pt -WTP with same after Q&A)        Anesthesia Quick Evaluation

## 2019-10-19 ENCOUNTER — Other Ambulatory Visit (INDEPENDENT_AMBULATORY_CARE_PROVIDER_SITE_OTHER): Payer: Self-pay | Admitting: Internal Medicine

## 2019-10-19 LAB — SURGICAL PATHOLOGY

## 2019-10-19 NOTE — Progress Notes (Signed)
Medrol

## 2019-10-20 ENCOUNTER — Encounter (HOSPITAL_COMMUNITY): Payer: Self-pay | Admitting: Internal Medicine

## 2019-10-20 ENCOUNTER — Telehealth: Payer: Self-pay

## 2019-10-20 NOTE — Telephone Encounter (Signed)
PT made aware, voiced understanding of plan. She will update if blood pressure rises.

## 2019-10-20 NOTE — Telephone Encounter (Signed)
New message    Pt c/o medication issue:  1. Name of Medication:  lisinopril (ZESTRIL) 5 MG tablet(Expired) Take 1 tablet (5 mg total) by mouth daily. Patient taking differently: Take 5 mg by mouth at bedtime.      2. How are you currently taking this medication (dosage and times per day)? Stopped taking it  3. Are you having a reaction (difficulty breathing--STAT)? Stomach issues   4. What is your medication issue? Dr Corbin Ade told her to stop taking this medication , he believes it is causing her stomach issues

## 2019-10-20 NOTE — Telephone Encounter (Signed)
Will forward to Dr. Bronson Ing as an fyi.

## 2019-10-20 NOTE — Telephone Encounter (Signed)
Ok to hold lisinopril for now. Does she have a way to check her bp at home? If so would monitor over the next week and update [place] on numbers next week. Since she has had side effects on meds before would want to be sure bp's are remaining elevated and she absolutely requires another agent.    Zandra Abts MD

## 2019-10-21 NOTE — Telephone Encounter (Signed)
Leave voice message if she doesn't answer -she is at work  Patient feels that her blood pressure is still too high that she can feel that her face/ears are red

## 2019-10-21 NOTE — Telephone Encounter (Signed)
Forwarded it to pt via [employer].

## 2019-10-21 NOTE — Telephone Encounter (Signed)
Addressed elsewhere.

## 2019-10-21 NOTE — Telephone Encounter (Signed)
Pt was taken off of lisinopril by Dr. Laural Golden as he thought her stomach issues was caused by that. She thinks her blood pressure is running up, but has not checked it. Is there another medication she could try to help decrease her blood pressure.

## 2019-10-22 MED ORDER — LOSARTAN POTASSIUM 25 MG PO TABS: 25.0000 mg | ORAL_TABLET | Freq: Every day | ORAL | 3 refills | Status: DC

## 2019-10-22 NOTE — Telephone Encounter (Signed)
Start losartan 25 mg daily

## 2019-10-22 NOTE — Telephone Encounter (Signed)
I spoke with patient, she will start losartan 25 mg daily, requests 90 day supply

## 2019-10-22 NOTE — Telephone Encounter (Signed)
New message     Pt c/o BP issue: STAT if pt c/o blurred vision, one-sided weakness or slurred speech  1. What are your last 5 BP readings?  180/99  147/93   2. Are you having any other symptoms (ex. Dizziness, headache, blurred vision, passed out)? nauseated and headache   3. What is your BP issue? Since stopping bp medication her bp has been elevated , she just got of work and she is feel nauseated has not had a break or lunch ?

## 2019-10-22 NOTE — Telephone Encounter (Signed)
New issue, will forward to Dr.Koneswaran

## 2019-10-26 ENCOUNTER — Encounter (INDEPENDENT_AMBULATORY_CARE_PROVIDER_SITE_OTHER): Payer: Self-pay

## 2019-11-02 DIAGNOSIS — Z0289 Encounter for other administrative examinations: Secondary | ICD-10-CM

## 2019-11-04 DIAGNOSIS — R1013 Epigastric pain: Secondary | ICD-10-CM | POA: Diagnosis not present

## 2019-11-04 DIAGNOSIS — R7982 Elevated C-reactive protein (CRP): Secondary | ICD-10-CM | POA: Diagnosis not present

## 2019-11-05 LAB — CBC WITH DIFFERENTIAL/PLATELET
Absolute Monocytes: 671 cells/uL (ref 200–950)
Basophils Absolute: 39 cells/uL (ref 0–200)
Basophils Relative: 0.5 %
Eosinophils Absolute: 117 cells/uL (ref 15–500)
Eosinophils Relative: 1.5 %
HCT: 34.1 % — ABNORMAL LOW (ref 35.0–45.0)
Hemoglobin: 11.7 g/dL (ref 11.7–15.5)
Lymphs Abs: 2590 cells/uL (ref 850–3900)
MCH: 33.3 pg — ABNORMAL HIGH (ref 27.0–33.0)
MCHC: 34.3 g/dL (ref 32.0–36.0)
MCV: 97.2 fL (ref 80.0–100.0)
MPV: 9.7 fL (ref 7.5–12.5)
Monocytes Relative: 8.6 %
Neutro Abs: 4384 cells/uL (ref 1500–7800)
Neutrophils Relative %: 56.2 %
Platelets: 200 10*3/uL (ref 140–400)
RBC: 3.51 10*6/uL — ABNORMAL LOW (ref 3.80–5.10)
RDW: 12.5 % (ref 11.0–15.0)
Total Lymphocyte: 33.2 %
WBC: 7.8 10*3/uL (ref 3.8–10.8)

## 2019-11-05 LAB — C-REACTIVE PROTEIN: CRP: 18 mg/L — ABNORMAL HIGH (ref <8.0)

## 2019-11-05 LAB — SEDIMENTATION RATE: Sed Rate: 14 mm/h (ref 0–30)

## 2019-11-07 ENCOUNTER — Encounter (INDEPENDENT_AMBULATORY_CARE_PROVIDER_SITE_OTHER): Payer: Self-pay

## 2019-11-09 ENCOUNTER — Other Ambulatory Visit (INDEPENDENT_AMBULATORY_CARE_PROVIDER_SITE_OTHER): Payer: Self-pay | Admitting: *Deleted

## 2019-11-09 MED ORDER — CYANOCOBALAMIN 1000 MCG/ML IJ SOLN: INTRAMUSCULAR | 1 refills | Status: DC

## 2019-11-11 ENCOUNTER — Telehealth (INDEPENDENT_AMBULATORY_CARE_PROVIDER_SITE_OTHER): Payer: Self-pay | Admitting: *Deleted

## 2019-11-11 ENCOUNTER — Encounter (INDEPENDENT_AMBULATORY_CARE_PROVIDER_SITE_OTHER): Payer: Self-pay

## 2019-11-11 NOTE — Telephone Encounter (Signed)
I have noticed that some of my levels are below normal. So what are we going to do to figure out why some levels are low. I looked at those levels on the internet and it is saying cancer. I want to know why some of my levels are low from my blood work. Do I have a reason to worry.?   Dr.Rehman the above is what the patient sent through "Patient Advice."  Would you please review and talk with the patient.

## 2019-11-13 ENCOUNTER — Other Ambulatory Visit (INDEPENDENT_AMBULATORY_CARE_PROVIDER_SITE_OTHER): Payer: Self-pay | Admitting: *Deleted

## 2019-11-13 DIAGNOSIS — K529 Noninfective gastroenteritis and colitis, unspecified: Secondary | ICD-10-CM

## 2019-11-13 DIAGNOSIS — R7982 Elevated C-reactive protein (CRP): Secondary | ICD-10-CM

## 2019-11-16 ENCOUNTER — Telehealth (INDEPENDENT_AMBULATORY_CARE_PROVIDER_SITE_OTHER): Payer: BC Managed Care – PPO | Admitting: Cardiovascular Disease

## 2019-11-16 ENCOUNTER — Encounter: Payer: Self-pay | Admitting: Cardiovascular Disease

## 2019-11-16 VITALS — BP 127/82 | HR 85 | Ht 63.0 in | Wt 180.0 lb

## 2019-11-16 DIAGNOSIS — I10 Essential (primary) hypertension: Secondary | ICD-10-CM

## 2019-11-16 DIAGNOSIS — R7982 Elevated C-reactive protein (CRP): Secondary | ICD-10-CM | POA: Diagnosis not present

## 2019-11-16 DIAGNOSIS — R079 Chest pain, unspecified: Secondary | ICD-10-CM

## 2019-11-16 NOTE — Patient Instructions (Signed)
Medication Instructions:  Your physician recommends that you continue on your current medications as directed. Please refer to the Current Medication list given to you today.  *If you need a refill on your cardiac medications before your next appointment, please call your pharmacy*  Lab Work: Fasting Lipids If you have labs (blood work) drawn today and your tests are completely normal, you will receive your results only by: Marland Kitchen MyChart Message (if you have MyChart) OR . A paper copy in the mail If you have any lab test that is abnormal or we need to change your treatment, we will call you to review the results.  Testing/Procedures: None today  Follow-Up: At [facility], you and your health needs are our priority.  As part of our continuing mission to provide you with exceptional heart care, we have created designated Provider Care Teams.  These Care Teams include your primary Cardiologist (physician) and Advanced Practice Providers (APPs -  Physician Assistants and Nurse Practitioners) who all work together to provide you with the care you need, when you need it.  Your next appointment:   6 months  The format for your next appointment:  In office  Provider:   Dr.Koneswaran  Other Instructions None      Thank you for choosing [facility] !

## 2019-11-16 NOTE — Telephone Encounter (Signed)
Already addressed. Please see my note from 11/11/2019 tagged with patient's recent labs.

## 2019-11-16 NOTE — Progress Notes (Signed)
Virtual Visit via Video Note   This visit type was conducted due to national recommendations for restrictions regarding the COVID-19 Pandemic (e.g. social distancing) in an effort to limit this patient's exposure and mitigate transmission in our community.  Due to her co-morbid illnesses, this patient is at least at moderate risk for complications without adequate follow up.  This format is felt to be most appropriate for this patient at this time.  All issues noted in this document were discussed and addressed.  A limited physical exam was performed with this format.  Please refer to the patient's chart for her consent to telehealth for [facility].   Date:  11/16/2019   ID:  [name], DOB 1966/01/10, MRN [id number]  Patient Location: Home Provider Location: Office  PCP:  Asencion Noble, MD  Cardiologist:  Kate Sable, MD  Electrophysiologist:  None   Evaluation Performed:  Follow-Up Visit  Chief Complaint:  Elevated CRP  History of Present Illness:    [name] is a 53 y.o. female with a history of hypertension, fibromyalgia, and GERD.  She was seen by GI and found to have an elevated CRP.  She looked this up online and found that it can be associated with heart disease.  She denies chest pain, shortness of breath, palpitations, orthopnea and paroxysmal nocturnal dyspnea.  She has chronic issues with her right foot with swelling in the ankle and last 2 toes.  She had surgery in this leg in the past.  She denies claudication pain.  She has been on dicyclomine and has not had any issues with epigastric pain recently.  She was unable to tolerate losartan and is back on lisinopril.  Her father-in-law recently had undergo bilateral lower extremity amputation.  Her in-laws live next-door.  She has been working 10-hour shifts 5 days/week at [employer] in [place]. The patient does not have symptoms concerning for COVID-19 infection (fever, chills,  cough, or new shortness of breath).    Past Medical History:  Diagnosis Date  . Asthma    triggered by weather changes; no inhaler  . Cancer ([place])    Cervical  . Collagen vascular disease ([place])   . Fibromyalgia   . Ganglion cyst of wrist 12/2012   left dorsal cyst  . GERD (gastroesophageal reflux disease)    TUMS as needed  . Headache(784.0)    tension/migraines  . PONV (postoperative nausea and vomiting)    also states stopped breathing after hernia surgery  . Rectal bleeding   . Rheumatoid arthritis ([place])   . Stenosing tenosynovitis of finger 12/2012   left middle finger  . 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 [facility])    Past Surgical History:  Procedure Laterality Date  . ABDOMINAL HYSTERECTOMY  1994  . APPENDECTOMY  1996  . BIOPSY  12/01/2018   Procedure: BIOPSY;  Surgeon: Rogene Houston, MD;  [facility];  Service: Endoscopy;;  gastric   . BIOPSY N/A 10/16/2019   Procedure: DUODENUM and JEJUNUM BIOPSY;  Surgeon: Rogene Houston, MD;  [facility];  Service: Endoscopy;  Laterality: N/A;  . COLONOSCOPY N/A 05/08/2013   Procedure: COLONOSCOPY;  Surgeon: Rogene Houston, MD;  [facility];  Service: Endoscopy;  Laterality: N/A;  240  . COLONOSCOPY N/A 09/23/2015   Procedure: COLONOSCOPY;  Surgeon: Rogene Houston, MD;  [facility];  Service: Endoscopy;  Laterality: N/A;  825  . CYSTOSCOPY WITH HYDRODISTENSION AND BIOPSY  04/01/2007  . [place]   left tube and ovary removed  . ENTEROSCOPY N/A 10/16/2019   Procedure: PUSH ENTEROSCOPY;  Surgeon: Rogene Houston, MD;  [facility];  Service: Endoscopy;  Laterality: N/A;  . ESOPHAGOGASTRODUODENOSCOPY N/A 12/01/2018   Procedure: ESOPHAGOGASTRODUODENOSCOPY (EGD);  Surgeon: Rogene Houston, MD;  [facility];  Service: Endoscopy;  Laterality: N/A;  12:45  . ESOPHAGOGASTRODUODENOSCOPY (EGD) WITH PROPOFOL N/A 10/16/2019   Procedure:  ESOPHAGOGASTRODUODENOSCOPY (EGD) WITH PROPOFOL;  Surgeon: Rogene Houston, MD;  [facility];  Service: Endoscopy;  Laterality: N/A;  1430pm  . GANGLION CYST EXCISION  12/17/2012   Procedure: REMOVAL GANGLION OF WRIST;  Surgeon: Wynonia Sours, MD;  Location: [place];  Service: Orthopedics;  Laterality: Left;  . HARDWARE REMOVAL Right 08/09/2014   Procedure: Placement Right Ankle Tight Rope, Removal Plate and Screws;  Surgeon: Marybelle Killings, MD;  Location: [place];  Service: Orthopedics;  Laterality: Right;  . PUBOVAGINAL SLING  2010  . rt ankle surgery    . SALPINGOOPHORECTOMY  1996   right  . TRIGGER FINGER RELEASE  12/17/2012   Procedure: RELEASE TRIGGER FINGER/A-1 PULLEY;  Surgeon: Wynonia Sours, MD;  Location: [place];  Service: Orthopedics;  Laterality: Left;  . TUBAL LIGATION  1992  . VAGINA RECONSTRUCTION SURGERY     x 5 since 2010  . VENTRAL HERNIA REPAIR  09/06/2006     Current Meds  Medication Sig  . acetaminophen (TYLENOL) 325 MG tablet Take 2 tablets (650 mg total) by mouth every 6 (six) hours as needed for mild pain, fever or headache.  . augmented betamethasone dipropionate (DIPROLENE-AF) 0.05 % cream Apply 1 application topically at bedtime.   . Cholecalciferol (VITAMIN D3) 125 MCG (5000 UT) CAPS Take 5,000 Units by mouth daily.   . cyanocobalamin (,VITAMIN B-12,) 1000 MCG/ML injection Inject 1 mL intramuscularly monthly.  . dicyclomine (BENTYL) 20 MG tablet Take 1 tablet (20 mg total) by mouth 2 (two) times daily before a meal. (Patient taking differently: Take 10 mg by mouth 4 (four) times daily -  before meals and at bedtime. )  . estradiol (ESTRACE) 2 MG tablet Take 1 tablet (2 mg total) by mouth daily.  Marland Kitchen gabapentin (NEURONTIN) 400 MG capsule Take 1 capsule (400 mg total) by mouth at bedtime.  Marland Kitchen ibuprofen (ADVIL) 200 MG tablet Take 200 mg by mouth every 6 (six) hours as needed. Takes 3 tablets bid  . lisinopril (ZESTRIL) 5 MG tablet  Take 1 tablet (5 mg total) by mouth daily. (Patient taking differently: Take 5 mg by mouth at bedtime. )  . ondansetron (ZOFRAN ODT) 8 MG disintegrating tablet Take 1 tablet (8 mg total) by mouth every 8 (eight) hours as needed for nausea or vomiting.  . pantoprazole (PROTONIX) 40 MG tablet Take 1 tablet (40 mg total) by mouth daily before breakfast.     Allergies:   Imitrex [sumatriptan], Cymbalta [duloxetine hcl], Latex, Propoxyphene n-acetaminophen, and Tramadol   Social History   Tobacco Use  . Smoking status: Never Smoker  . Smokeless tobacco: Never Used  Substance Use Topics  . Alcohol use: No  . Drug use: No     Family Hx: The patient's family history includes Cancer in her maternal grandmother; Cervical cancer in her mother; Heart disease in her father; Leukemia in her father; Lung cancer in her mother.  ROS:   Please see the history of present illness.  All other systems reviewed and are negative.   Prior CV studies:   The following studies were reviewed today:  NST 01/27/19:   Blood pressure demonstrated a normal response to exercise.  There was no ST segment deviation noted during stress.  The study is normal. There are no perfusion defects  This is a low risk study.  The left ventricular ejection fraction is hyperdynamic (>65%).    Echocardiogram 01/27/19:  1. The left ventricle has normal systolic function with an ejection fraction of 60-65%. The cavity size was normal. There is mildly increased left ventricular wall thickness. Left ventricular diastolic parameters were normal. 2. The right ventricle has normal systolic function. The cavity was normal. There is no increase in right ventricular wall thickness. 3. The mitral valve is normal in structure. No evidence of mitral valve stenosis. 4. The tricuspid valve is normal in structure. 5. The aortic valve has an indeterminant number of cusps no stenosis of the aortic valve. 6. The aortic root  is normal in size and structure. 7. Pulmonary hypertension is Indeterminant, inadequate TR jet. 8. Right atrial pressure is estimated at 3 mmHg.   Labs/Other Tests and Data Reviewed:    EKG:  No ECG reviewed.  Recent Labs: 06/21/2019: Magnesium 2.0 09/30/2019: ALT 7; BUN 10; Creat 0.68; Potassium 4.5; Sodium 139 11/04/2019: Hemoglobin 11.7; Platelets 200   Recent Lipid Panel No results found for: CHOL, TRIG, HDL, CHOLHDL, LDLCALC, LDLDIRECT  Wt Readings from Last 3 Encounters:  11/16/19 180 lb (81.6 kg)  10/16/19 175 lb (79.4 kg)  10/05/19 175 lb 1.6 oz (79.4 kg)     Objective:    Vital Signs:  BP 127/82   Pulse 85   Ht 5\' 3"  (1.6 m)   Wt 180 lb (81.6 kg)   BMI 31.89 kg/m    VITAL SIGNS:  reviewed  ASSESSMENT & PLAN:    1.  Elevated CRP: While elevated CRP is associated with cardiovascular disease/events, she denies any anginal symptoms at this time.  She does have a history of hyperlipidemia and tells me her lipids have not been checked in at least 2 years but they were markedly elevated at that time.  I will check a lipid panel.  If they are elevated I will start statin therapy for both cholesterol reduction and pleiotropic effects of inflammation reduction with consequent reduction in CRP.  2.  Chest pain: Denies any chest pain at this time.  Nuclear stress test was normal and cardiac function is normal.  3.  Hypertension: Blood pressure is normal on lisinopril.  No changes to therapy.    COVID-19 Education: The signs and symptoms of COVID-19 were discussed with the patient and how to seek care for testing (follow up with PCP or arrange E-visit).  The importance of social distancing was discussed today.  Time:   Today, I have spent 25 minutes with the patient with telehealth technology discussing the above problems.     Medication Adjustments/Labs and Tests Ordered: Current medicines are reviewed at length with the patient today.  Concerns regarding medicines  are outlined above.   Tests Ordered: No orders of the defined types were placed in this encounter.   Medication Changes: No orders of the defined types were placed in this encounter.   Follow Up:  In Person in 6 month(s)  Signed, Kate Sable, MD  11/16/2019 12:51 PM    [place]

## 2019-11-18 ENCOUNTER — Other Ambulatory Visit: Payer: Self-pay

## 2019-11-18 DIAGNOSIS — I10 Essential (primary) hypertension: Secondary | ICD-10-CM | POA: Diagnosis not present

## 2019-11-18 DIAGNOSIS — Z20822 Contact with and (suspected) exposure to covid-19: Secondary | ICD-10-CM

## 2019-11-19 LAB — NOVEL CORONAVIRUS, NAA: SARS-CoV-2, NAA: NOT DETECTED

## 2019-11-19 LAB — LIPID PANEL
Cholesterol: 191 mg/dL (ref <200)
HDL: 51 mg/dL (ref >=50)
LDL Cholesterol (Calc): 103 mg/dL (calc) — ABNORMAL HIGH
Non-HDL Cholesterol (Calc): 140 mg/dL (calc) — ABNORMAL HIGH (ref <130)
Total CHOL/HDL Ratio: 3.7 (calc) (ref <5.0)
Triglycerides: 256 mg/dL — ABNORMAL HIGH (ref <150)

## 2019-11-23 ENCOUNTER — Telehealth (INDEPENDENT_AMBULATORY_CARE_PROVIDER_SITE_OTHER): Payer: Self-pay | Admitting: Internal Medicine

## 2019-11-23 NOTE — Telephone Encounter (Signed)
Patient states that her stomach is still hurting but she has not been throwing up anymore. Afebrile. Per Dr.Rehman the patient should go to the ED for further evaluation. Patient does states that if she starts throwing up again she will go to the ED.

## 2019-11-23 NOTE — Telephone Encounter (Signed)
Patient called stated she is having some abd pain and has been vomiting - please advise - ph# [phone]

## 2019-11-26 ENCOUNTER — Ambulatory Visit: Payer: BC Managed Care – PPO | Admitting: Cardiovascular Disease

## 2019-12-08 ENCOUNTER — Ambulatory Visit: Payer: BC Managed Care – PPO | Attending: Internal Medicine

## 2019-12-08 ENCOUNTER — Other Ambulatory Visit: Payer: Self-pay

## 2019-12-08 DIAGNOSIS — Z20822 Contact with and (suspected) exposure to covid-19: Secondary | ICD-10-CM

## 2019-12-08 DIAGNOSIS — Z20828 Contact with and (suspected) exposure to other viral communicable diseases: Secondary | ICD-10-CM | POA: Diagnosis not present

## 2019-12-10 LAB — NOVEL CORONAVIRUS, NAA: SARS-CoV-2, NAA: NOT DETECTED

## 2019-12-13 ENCOUNTER — Ambulatory Visit
Admission: EM | Admit: 2019-12-13 | Discharge: 2019-12-13 | Disposition: A | Discharge status: Home / Self Care | Payer: BC Managed Care – PPO | Attending: Emergency Medicine | Admitting: Emergency Medicine

## 2019-12-13 ENCOUNTER — Other Ambulatory Visit: Payer: Self-pay

## 2019-12-13 DIAGNOSIS — R110 Nausea: Secondary | ICD-10-CM | POA: Diagnosis not present

## 2019-12-13 DIAGNOSIS — M791 Myalgia, unspecified site: Secondary | ICD-10-CM | POA: Diagnosis not present

## 2019-12-13 DIAGNOSIS — Z20822 Contact with and (suspected) exposure to covid-19: Secondary | ICD-10-CM

## 2019-12-13 DIAGNOSIS — R5383 Other fatigue: Secondary | ICD-10-CM | POA: Diagnosis not present

## 2019-12-13 NOTE — ED Provider Notes (Signed)
[facility]    CSN: [id number] Arrival date & time: 12/13/19  1435      History   Chief Complaint Chief Complaint  Patient presents with  . Generalized Body Aches    HPI [name] is a 54 y.o. female.   [name] 54 years old female presented to urgent care with a complaint of positive Covid exposure for the past week. Reports chills, fatigue and nausea for the past 2 days. Denies sick exposure to  flu or strep.  Denies recent travel.  Denies aggravating or alleviating symptoms.  Denies previous COVID infection.   Denies fever, chills, fatigue, nasal congestion, rhinorrhea, sore throat, cough, SOB, wheezing, chest pain, nausea, vomiting, changes in bowel or bladder habits.    The history is provided by the patient. No language interpreter was used.    Past Medical History:  Diagnosis Date  . Asthma    triggered by weather changes; no inhaler  . Cancer ([place])    Cervical  . Collagen vascular disease ([place])   . Fibromyalgia   . Ganglion cyst of wrist 12/2012   left dorsal cyst  . GERD (gastroesophageal reflux disease)    TUMS as needed  . Headache(784.0)    tension/migraines  . PONV (postoperative nausea and vomiting)    also states stopped breathing after hernia surgery  . Rectal bleeding   . Rheumatoid arthritis ([place])   . Stenosing tenosynovitis of finger 12/2012   left middle finger  . 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 Non-intractable vomiting 10/08/2019  . CRP elevated 10/05/2019  . Enteritis presumed infectious 06/29/2019  . Anemia 06/29/2019  . Anemia due to vitamin B12 deficiency   . Nausea   . Gastroesophageal reflux disease 10/23/2018  . Abdominal pain, epigastric 10/23/2018  . Chest pain 10/16/2018  . Stiffness of joint, not elsewhere classified, ankle and foot 09/07/2014  . Pain in joint, ankle and foot 09/07/2014  . Difficulty in walking(719.7) 09/07/2014  . Right  ankle instability 08/09/2014  . Hematochezia 08/11/2013  . Constipation 08/11/2013  . RHEUMATOID ARTHRITIS 02/20/2010  . KNEE PAIN 02/20/2010  . FIBROMYALGIA 02/20/2010    Past Surgical History:  Procedure Laterality Date  . ABDOMINAL HYSTERECTOMY  1994  . APPENDECTOMY  1996  . BIOPSY  12/01/2018   Procedure: BIOPSY;  Surgeon: Rogene Houston, MD;  [facility];  Service: Endoscopy;;  gastric   . BIOPSY N/A 10/16/2019   Procedure: DUODENUM and JEJUNUM BIOPSY;  Surgeon: Rogene Houston, MD;  [facility];  Service: Endoscopy;  Laterality: N/A;  . COLONOSCOPY N/A 05/08/2013   Procedure: COLONOSCOPY;  Surgeon: Rogene Houston, MD;  [facility];  Service: Endoscopy;  Laterality: N/A;  240  . COLONOSCOPY N/A 09/23/2015   Procedure: COLONOSCOPY;  Surgeon: Rogene Houston, MD;  [facility];  Service: Endoscopy;  Laterality: N/A;  825  . CYSTOSCOPY WITH HYDRODISTENSION AND BIOPSY  04/01/2007  . [place]   left tube and ovary removed  . ENTEROSCOPY N/A 10/16/2019   Procedure: PUSH ENTEROSCOPY;  Surgeon: Rogene Houston, MD;  [facility];  Service: Endoscopy;  Laterality: N/A;  . ESOPHAGOGASTRODUODENOSCOPY N/A 12/01/2018   Procedure: ESOPHAGOGASTRODUODENOSCOPY (EGD);  Surgeon: Rogene Houston, MD;  [facility];  Service: Endoscopy;  Laterality: N/A;  12:45  . ESOPHAGOGASTRODUODENOSCOPY (EGD) WITH PROPOFOL N/A  10/16/2019   Procedure: ESOPHAGOGASTRODUODENOSCOPY (EGD) WITH PROPOFOL;  Surgeon: Rogene Houston, MD;  [facility];  Service: Endoscopy;  Laterality: N/A;  1430pm  . GANGLION CYST EXCISION  12/17/2012   Procedure: REMOVAL GANGLION OF WRIST;  Surgeon: Wynonia Sours, MD;  Location: [place];  Service: Orthopedics;  Laterality: Left;  . HARDWARE REMOVAL Right 08/09/2014   Procedure: Placement Right Ankle Tight Rope, Removal Plate and Screws;  Surgeon: Marybelle Killings, MD;   Location: [place];  Service: Orthopedics;  Laterality: Right;  . PUBOVAGINAL SLING  2010  . rt ankle surgery    . SALPINGOOPHORECTOMY  1996   right  . TRIGGER FINGER RELEASE  12/17/2012   Procedure: RELEASE TRIGGER FINGER/A-1 PULLEY;  Surgeon: Wynonia Sours, MD;  Location: [place];  Service: Orthopedics;  Laterality: Left;  . TUBAL LIGATION  1992  . VAGINA RECONSTRUCTION SURGERY     x 5 since 2010  . VENTRAL HERNIA REPAIR  09/06/2006    OB History    Gravida  3   Para  2   Term  2   Preterm  0   AB  1   Living  2     SAB      TAB      Ectopic  1   Multiple      Live Births  2            Home Medications    Prior to Admission medications   Medication Sig Start Date End Date Taking? Authorizing Provider  acetaminophen (TYLENOL) 325 MG tablet Take 2 tablets (650 mg total) by mouth every 6 (six) hours as needed for mild pain, fever or headache. 06/23/19   Barton Dubois, MD  augmented betamethasone dipropionate (DIPROLENE-AF) 0.05 % cream Apply 1 application topically at bedtime.  09/23/18   [provider]  benzonatate (TESSALON) 200 MG capsule Take 200 mg by mouth as needed for cough.  10/02/19   [provider]  Cholecalciferol (VITAMIN D3) 125 MCG (5000 UT) CAPS Take 5,000 Units by mouth daily.     [provider]  cyanocobalamin (,VITAMIN B-12,) 1000 MCG/ML injection Inject 1 mL intramuscularly monthly. 11/09/19   Rehman, Mechele Dawley, MD  dicyclomine (BENTYL) 20 MG tablet Take 1 tablet (20 mg total) by mouth 2 (two) times daily before a meal. Patient taking differently: Take 10 mg by mouth 4 (four) times daily -  before meals and at bedtime.  10/05/19   Rehman, Mechele Dawley, MD  estradiol (ESTRACE) 2 MG tablet Take 1 tablet (2 mg total) by mouth daily. 06/24/19   Florian Buff, MD  gabapentin (NEURONTIN) 400 MG capsule Take 1 capsule (400 mg total) by mouth at bedtime. 12/25/16   Setzer, Rona Ravens, NP  ibuprofen (ADVIL) 200 MG  tablet Take 200 mg by mouth every 6 (six) hours as needed. Takes 3 tablets bid    [provider]  lisinopril (ZESTRIL) 5 MG tablet Take 1 tablet (5 mg total) by mouth daily. Patient taking differently: Take 5 mg by mouth at bedtime.  06/09/19 11/16/19  Herminio Commons, MD  ondansetron (ZOFRAN ODT) 8 MG disintegrating tablet Take 1 tablet (8 mg total) by mouth every 8 (eight) hours as needed for nausea or vomiting. 10/16/19   Rehman, Mechele Dawley, MD  oxyCODONE (ROXICODONE) 5 MG immediate release tablet Take 1 tablet (5 mg total) by mouth every 6 (six) hours as needed for severe pain. Patient  not taking: Reported on 11/16/2019 09/28/19   Rogene Houston, MD  pantoprazole (PROTONIX) 40 MG tablet Take 1 tablet (40 mg total) by mouth daily before breakfast. 10/05/19   Rehman, Mechele Dawley, MD    Family History Family History  Problem Relation Age of Onset  . Lung cancer Mother   . Cervical cancer Mother   . Heart disease Father   . Leukemia Father   . Cancer Maternal Grandmother        bladder and colon cancer    Social History Social History   Tobacco Use  . Smoking status: Never Smoker  . Smokeless tobacco: Never Used  Substance Use Topics  . Alcohol use: No  . Drug use: No     Allergies   Imitrex [sumatriptan], Cymbalta [duloxetine hcl], Latex, Propoxyphene n-acetaminophen, and Tramadol   Review of Systems Review of Systems  Constitutional: Positive for chills and fatigue.  HENT: Negative.   Respiratory: Negative.   Cardiovascular: Negative.   Gastrointestinal: Positive for nausea.  Neurological: Negative.   ROS: All other negative   Physical Exam Triage Vital Signs ED Triage Vitals  Enc Vitals Group     BP      Pulse      Resp      Temp      Temp src      SpO2      Weight      Height      Head Circumference      Peak Flow      Pain Score      Pain Loc      Pain Edu?      Excl. in [place]?    No data found.  Updated Vital Signs BP (!) 129/91   Pulse  92   Temp 98.9 F (37.2 C)   Resp 18   SpO2 95%   Visual Acuity Right Eye Distance:   Left Eye Distance:   Bilateral Distance:    Right Eye Near:   Left Eye Near:    Bilateral Near:     Physical Exam Constitutional:      General: She is not in acute distress.    Appearance: Normal appearance. She is normal weight. She is not ill-appearing or toxic-appearing.  HENT:     Head: Normocephalic.     Right Ear: Tympanic membrane, ear canal and external ear normal. There is no impacted cerumen.     Left Ear: Tympanic membrane, ear canal and external ear normal. There is no impacted cerumen.     Nose: Nose normal. No congestion.     Mouth/Throat:     Mouth: Mucous membranes are moist.     Pharynx: No oropharyngeal exudate or posterior oropharyngeal erythema.  Cardiovascular:     Rate and Rhythm: Normal rate and regular rhythm.     Pulses: Normal pulses.     Heart sounds: Normal heart sounds. No murmur.  Pulmonary:     Effort: Pulmonary effort is normal. No respiratory distress.     Breath sounds: No wheezing or rhonchi.  Chest:     Chest wall: No tenderness.  Abdominal:     General: Abdomen is flat. Bowel sounds are normal. There is no distension.     Palpations: There is no mass.  Skin:    Capillary Refill: Capillary refill takes less than 2 seconds.  Neurological:     Mental Status: She is alert and oriented to person, place, and time.      UC  Treatments / Results  Labs (all labs ordered are listed, but only abnormal results are displayed) Labs Reviewed  NOVEL CORONAVIRUS, NAA    EKG   Radiology No results found.  Procedures Procedures (including critical care time)  Medications Ordered in UC Medications - No data to display  Initial Impression / Assessment and Plan / UC Course  I have reviewed the triage vital signs and the nursing notes.  Pertinent labs & imaging results that were available during my care of the patient were reviewed by me and  considered in my medical decision making (see chart for details).   COVID-19 test was ordered. Advised patient to quarantine until Covid test result become available. To go to ED for worsening of symptoms. Patient verbalized understanding of the plan of care.  Final Clinical Impressions(s) / UC Diagnoses   Final diagnoses:  Exposure to COVID-19 virus     Discharge Instructions     COVID testing ordered.  It will take between 2-7 days for test results.  Someone will contact you regarding abnormal results.    In the meantime: You should remain isolated in your home for 10 days from symptom onset  Get plenty of rest and push fluids Use medications daily for symptom relief Use OTC medications like ibuprofen or tylenol as needed fever or pain Call or go to the ED if you have any new or worsening symptoms such as fever, worsening cough, shortness of breath, chest tightness, chest pain, turning blue, changes in mental status, etc...     ED Prescriptions    None     PDMP not reviewed this encounter.   Emerson Monte, FNP 12/13/19 1502

## 2019-12-13 NOTE — ED Triage Notes (Signed)
Pt presents with fatigue, upset stomach ache and chills, husband tested positive on Friday

## 2019-12-13 NOTE — Discharge Instructions (Addendum)

## 2019-12-15 LAB — NOVEL CORONAVIRUS, NAA: SARS-CoV-2, NAA: DETECTED — AB

## 2019-12-16 ENCOUNTER — Telehealth (HOSPITAL_COMMUNITY): Payer: Self-pay | Admitting: Emergency Medicine

## 2019-12-16 NOTE — Telephone Encounter (Signed)

## 2019-12-24 ENCOUNTER — Other Ambulatory Visit (HOSPITAL_COMMUNITY): Payer: Self-pay | Admitting: Obstetrics & Gynecology

## 2019-12-24 ENCOUNTER — Other Ambulatory Visit: Payer: Self-pay

## 2019-12-24 ENCOUNTER — Ambulatory Visit: Payer: BC Managed Care – PPO | Attending: Internal Medicine

## 2019-12-24 DIAGNOSIS — Z20822 Contact with and (suspected) exposure to covid-19: Secondary | ICD-10-CM

## 2019-12-24 DIAGNOSIS — Z1231 Encounter for screening mammogram for malignant neoplasm of breast: Secondary | ICD-10-CM

## 2019-12-25 LAB — NOVEL CORONAVIRUS, NAA: SARS-CoV-2, NAA: NOT DETECTED

## 2019-12-29 ENCOUNTER — Ambulatory Visit (INDEPENDENT_AMBULATORY_CARE_PROVIDER_SITE_OTHER): Payer: BLUE CROSS/BLUE SHIELD | Admitting: Internal Medicine

## 2020-02-03 ENCOUNTER — Other Ambulatory Visit (INDEPENDENT_AMBULATORY_CARE_PROVIDER_SITE_OTHER): Payer: Self-pay | Admitting: *Deleted

## 2020-02-03 DIAGNOSIS — K529 Noninfective gastroenteritis and colitis, unspecified: Secondary | ICD-10-CM

## 2020-02-03 DIAGNOSIS — R7982 Elevated C-reactive protein (CRP): Secondary | ICD-10-CM

## 2020-02-04 ENCOUNTER — Other Ambulatory Visit: Payer: Self-pay

## 2020-02-04 ENCOUNTER — Ambulatory Visit (INDEPENDENT_AMBULATORY_CARE_PROVIDER_SITE_OTHER): Payer: BC Managed Care – PPO

## 2020-02-04 ENCOUNTER — Ambulatory Visit
Admission: EM | Admit: 2020-02-04 | Discharge: 2020-02-04 | Disposition: A | Discharge status: Home / Self Care | Payer: BC Managed Care – PPO | Attending: Emergency Medicine | Admitting: Emergency Medicine

## 2020-02-04 DIAGNOSIS — M25471 Effusion, right ankle: Secondary | ICD-10-CM

## 2020-02-04 DIAGNOSIS — M25571 Pain in right ankle and joints of right foot: Secondary | ICD-10-CM

## 2020-02-04 MED ORDER — MELOXICAM 7.5 MG PO TABS: 7.5000 mg | ORAL_TABLET | Freq: Every day | ORAL | 0 refills | Status: DC

## 2020-02-04 NOTE — Discharge Instructions (Signed)
X-rays negative for fracture or dislocation Continue conservative management of rest, ice, and elevation Cam walker placed.   Take mobic as needed for pain relief (may cause abdominal discomfort, ulcers, and GI bleeds avoid taking with other NSAIDs) Follow up with PCP if symptoms persist Return or go to the ER if you have any new or worsening symptoms (fever, chills, chest pain, increased swelling, redness, pain, worsening symptoms despite medication, etc...)

## 2020-02-04 NOTE — ED Triage Notes (Signed)
Pt presents with right ankle and foot pain that developed on Monday. Pt has h/o  hardware from former fracture

## 2020-02-04 NOTE — ED Provider Notes (Signed)
[place]   [id number] 02/04/20 Arrival Time: [id number]  CC: RT ankle pain  SUBJECTIVE: History from: patient. LAKERA JOSSELYN is a 54 y.o. female complains of right ankle pain that began 3 days ago.  Denies a precipitating event or specific injury.  Reports previous ankle fracture.  Localizes the pain to the outside of ankle.  Describes the pain as intermittent and achy in character.  10/10 with walking.  Has tried OTC medications without relief.  Symptoms are made worse with weight-bearing, and/or walking.  Denies similar symptoms in the past.  Complains of associated swelling and rash.  Is followed by dermatology for rash.  Denies fever, chills, erythema, ecchymosis, weakness, numbness and tingling.  ROS: As per HPI.  All other pertinent ROS negative.     Past Medical History:  Diagnosis Date  . Asthma    triggered by weather changes; no inhaler  . Cancer ([place])    Cervical  . Collagen vascular disease ([place])   . Fibromyalgia   . Ganglion cyst of wrist 12/2012   left dorsal cyst  . GERD (gastroesophageal reflux disease)    TUMS as needed  . Headache(784.0)    tension/migraines  . PONV (postoperative nausea and vomiting)    also states stopped breathing after hernia surgery  . Rectal bleeding   . Rheumatoid arthritis ([place])   . Stenosing tenosynovitis of finger 12/2012   left middle finger  . 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 ABDOMINAL HYSTERECTOMY  1994  . APPENDECTOMY  1996  . BIOPSY  12/01/2018   Procedure: BIOPSY;  Surgeon: Rogene Houston, MD;  [facility];  Service: Endoscopy;;  gastric   . BIOPSY N/A 10/16/2019   Procedure: DUODENUM and JEJUNUM BIOPSY;  Surgeon: Rogene Houston, MD;  [facility];  Service: Endoscopy;  Laterality: N/A;  . COLONOSCOPY N/A 05/08/2013   Procedure: COLONOSCOPY;  Surgeon: Rogene Houston, MD;  [facility];  Service: Endoscopy;   Laterality: N/A;  240  . COLONOSCOPY N/A 09/23/2015   Procedure: COLONOSCOPY;  Surgeon: Rogene Houston, MD;  [facility];  Service: Endoscopy;  Laterality: N/A;  825  . CYSTOSCOPY WITH HYDRODISTENSION AND BIOPSY  04/01/2007  . [place]   left tube and ovary removed  . ENTEROSCOPY N/A 10/16/2019   Procedure: PUSH ENTEROSCOPY;  Surgeon: Rogene Houston, MD;  [facility];  Service: Endoscopy;  Laterality: N/A;  . ESOPHAGOGASTRODUODENOSCOPY N/A 12/01/2018   Procedure: ESOPHAGOGASTRODUODENOSCOPY (EGD);  Surgeon: Rogene Houston, MD;  [facility];  Service: Endoscopy;  Laterality: N/A;  12:45  . ESOPHAGOGASTRODUODENOSCOPY (EGD) WITH PROPOFOL N/A 10/16/2019   Procedure: ESOPHAGOGASTRODUODENOSCOPY (EGD) WITH PROPOFOL;  Surgeon: Rogene Houston, MD;  [facility];  Service: Endoscopy;  Laterality: N/A;  1430pm  . GANGLION CYST EXCISION  12/17/2012   Procedure: REMOVAL GANGLION OF WRIST;  Surgeon: Wynonia Sours, MD;  Location: [place];  Service: Orthopedics;  Laterality: Left;  . HARDWARE REMOVAL Right 08/09/2014   Procedure: Placement Right Ankle Tight Rope, Removal Plate and Screws;  Surgeon: Marybelle Killings, MD;  Location: [place];  Service: Orthopedics;  Laterality: Right;  . PUBOVAGINAL SLING  2010  . rt ankle surgery    . SALPINGOOPHORECTOMY  1996   right  . TRIGGER FINGER RELEASE  12/17/2012   Procedure: RELEASE TRIGGER FINGER/A-1 PULLEY;  Surgeon: Wynonia Sours, MD;  Location: [place];  Service: Orthopedics;  Laterality: Left;  . TUBAL LIGATION  1992  . VAGINA RECONSTRUCTION SURGERY     x 5 since 2010  . VENTRAL HERNIA REPAIR  09/06/2006   Allergies  Allergen Reactions  . Imitrex [Sumatriptan]     Causes a migraine  . Cymbalta [Duloxetine Hcl] Rash  . Latex Rash  . Propoxyphene N-Acetaminophen Nausea And Vomiting and Rash  . Tramadol Rash   No current facility-administered medications on  file prior to encounter.   Current Outpatient Medications on File Prior to Encounter  Medication Sig Dispense Refill  . acetaminophen (TYLENOL) 325 MG tablet Take 2 tablets (650 mg total) by mouth every 6 (six) hours as needed for mild pain, fever or headache. 30 tablet 0  . augmented betamethasone dipropionate (DIPROLENE-AF) 0.05 % cream Apply 1 application topically at bedtime.   3  . Cholecalciferol (VITAMIN D3) 125 MCG (5000 UT) CAPS Take 5,000 Units by mouth daily.     . cyanocobalamin (,VITAMIN B-12,) 1000 MCG/ML injection Inject 1 mL intramuscularly monthly. 15 mL 1  . dicyclomine (BENTYL) 20 MG tablet Take 1 tablet (20 mg total) by mouth 2 (two) times daily before a meal. (Patient taking differently: Take 10 mg by mouth 4 (four) times daily -  before meals and at bedtime. ) 60 tablet 3  . estradiol (ESTRACE) 2 MG tablet Take 1 tablet (2 mg total) by mouth daily. 90 tablet 3  . gabapentin (NEURONTIN) 400 MG capsule Take 1 capsule (400 mg total) by mouth at bedtime. 90 capsule 0  . ibuprofen (ADVIL) 200 MG tablet Take 200 mg by mouth every 6 (six) hours as needed. Takes 3 tablets bid    . lisinopril (ZESTRIL) 5 MG tablet Take 1 tablet (5 mg total) by mouth daily. (Patient taking differently: Take 5 mg by mouth at bedtime. ) 90 tablet 3  . ondansetron (ZOFRAN ODT) 8 MG disintegrating tablet Take 1 tablet (8 mg total) by mouth every 8 (eight) hours as needed for nausea or vomiting. 30 tablet 1  . oxyCODONE (ROXICODONE) 5 MG immediate release tablet Take 1 tablet (5 mg total) by mouth every 6 (six) hours as needed for severe pain. (Patient not taking: Reported on 11/16/2019) 28 tablet 0  . pantoprazole (PROTONIX) 40 MG tablet Take 1 tablet (40 mg total) by mouth daily before breakfast. 90 tablet 3   Social History   Socioeconomic History  . Marital status: Married    Spouse name: Not on file  . Number of children: Not on file  . Years of education: Not on file  . Highest education level:  Not on file  Occupational History  . Not on file  Tobacco Use  . Smoking status: Never Smoker  . Smokeless tobacco: Never Used  Substance and Sexual Activity  . Alcohol use: No  . Drug use: No  . Sexual activity: Yes    Birth control/protection: Surgical    Comment: hyst  Other Topics Concern  . Not on file  Social History Narrative  . Not on file   Social Determinants of Health   Financial Resource Strain:   . Difficulty of Paying Living Expenses: Not on file  Food Insecurity:   . Worried About Charity fundraiser in the Last Year: Not on file  . Ran Out of Food in the Last Year: Not on file  Transportation Needs:   . Lack of Transportation (Medical): Not  on file  . Lack of Transportation (Non-Medical): Not on file  Physical Activity:   . Days of Exercise per Week: Not on file  . Minutes of Exercise per Session: Not on file  Stress:   . Feeling of Stress : Not on file  Social Connections:   . Frequency of Communication with Friends and Family: Not on file  . Frequency of Social Gatherings with Friends and Family: Not on file  . Attends Religious Services: Not on file  . Active Member of Clubs or Organizations: Not on file  . Attends Archivist Meetings: Not on file  . Marital Status: Not on file  Intimate Partner Violence:   . Fear of Current or Ex-Partner: Not on file  . Emotionally Abused: Not on file  . Physically Abused: Not on file  . Sexually Abused: Not on file   Family History  Problem Relation Age of Onset  . Lung cancer Mother   . Cervical cancer Mother   . Heart disease Father   . Leukemia Father   . Cancer Maternal Grandmother        bladder and colon cancer    OBJECTIVE:  Vitals:   02/04/20 1304  BP: 136/66  Pulse: 81  Resp: 18  Temp: 98.4 F (36.9 C)  SpO2: 97%    General appearance: ALERT; in no acute distress.  Head: NCAT Lungs: Normal respiratory effort CV: Dorsalis pedis pulse 2+. Cap refill < 2  seconds Musculoskeletal: RT ankle Inspection: Lateral ankle with overlying swelling.  Apx 3-4 cm round area of hyperpigmentation with overlying dry skin to distal lateral ankle over surgical incision.  Incision healed, no obvious redness, discharge or bleeding, NTTP Palpation: diffusely TTP over lateral ankle ROM: LROM about the ankle Strength: 5/5 dorsiflexion, 5/5 plantar flexion Skin: warm and dry Neurologic: Ambulates with difficulty Psychological: alert and cooperative; normal mood and affect  DIAGNOSTIC STUDIES:  DG Ankle Complete Right  Result Date: 02/04/2020 CLINICAL DATA:  RIGHT ankle pain EXAM: RIGHT ANKLE - COMPLETE 3+ VIEW COMPARISON:  None. FINDINGS: Prior ankle fracture and fusion noted. Cortical cyst screw tracks noted. Ankle mortise intact. Talar dome is normal. No effusion. Talar dome is normal. Calcaneus normal. IMPRESSION: 1. No acute findings of the RIGHT ankle. 2. Remote fracture and internal fixation with partial hardware removal. Electronically Signed   By: Suzy Bouchard M.D.   On: 02/04/2020 13:24    X-rays negative for bony abnormalities including acute fracture, or dislocation.  Hardware present.  No soft tissue swelling.    I have reviewed the x-rays myself and the radiologist interpretation. I am in agreement with the radiologist interpretation.     ASSESSMENT & PLAN:  1. Acute right ankle pain   2. Right ankle swelling    Meds ordered this encounter  Medications  . meloxicam (MOBIC) 7.5 MG tablet    Sig: Take 1 tablet (7.5 mg total) by mouth daily.    Dispense:  30 tablet    Refill:  0    Order Specific Question:   Supervising Provider    Answer:   Raylene Everts [id number]   X-rays negative for fracture or dislocation Continue conservative management of rest, ice, and elevation Cam walker placed.   Take mobic as needed for pain relief (may cause abdominal discomfort, ulcers, and GI bleeds avoid taking with other NSAIDs) Follow up with PCP if  symptoms persist Return or go to the ER if you have any new or worsening symptoms (fever, chills,  chest pain, increased swelling, redness, pain, worsening symptoms despite medication, etc...)   Reviewed expectations re: course of current medical issues. Questions answered. Outlined signs and symptoms indicating need for more acute intervention. Patient verbalized understanding. After Visit Summary given.    Lestine Box, PA-C 02/04/20 1354

## 2020-02-05 ENCOUNTER — Other Ambulatory Visit (INDEPENDENT_AMBULATORY_CARE_PROVIDER_SITE_OTHER): Payer: Self-pay | Admitting: Internal Medicine

## 2020-02-05 DIAGNOSIS — M25571 Pain in right ankle and joints of right foot: Secondary | ICD-10-CM | POA: Diagnosis not present

## 2020-02-10 ENCOUNTER — Other Ambulatory Visit: Payer: Self-pay

## 2020-02-10 ENCOUNTER — Ambulatory Visit (HOSPITAL_COMMUNITY)
Admission: RE | Admit: 2020-02-10 | Discharge: 2020-02-10 | Disposition: A | Discharge status: Home / Self Care | Payer: BC Managed Care – PPO | Source: Ambulatory Visit | Attending: Obstetrics & Gynecology | Admitting: Obstetrics & Gynecology

## 2020-02-10 DIAGNOSIS — Z1231 Encounter for screening mammogram for malignant neoplasm of breast: Secondary | ICD-10-CM | POA: Insufficient documentation

## 2020-02-10 DIAGNOSIS — M25571 Pain in right ankle and joints of right foot: Secondary | ICD-10-CM | POA: Diagnosis not present

## 2020-02-12 DIAGNOSIS — R7982 Elevated C-reactive protein (CRP): Secondary | ICD-10-CM | POA: Diagnosis not present

## 2020-02-12 DIAGNOSIS — K529 Noninfective gastroenteritis and colitis, unspecified: Secondary | ICD-10-CM | POA: Diagnosis not present

## 2020-02-13 LAB — CBC WITH DIFFERENTIAL/PLATELET
Absolute Monocytes: 391 cells/uL (ref 200–950)
Basophils Absolute: 31 cells/uL (ref 0–200)
Basophils Relative: 0.5 %
Eosinophils Absolute: 149 cells/uL (ref 15–500)
Eosinophils Relative: 2.4 %
HCT: 34.7 % — ABNORMAL LOW (ref 35.0–45.0)
Hemoglobin: 11.7 g/dL (ref 11.7–15.5)
Lymphs Abs: 2728 cells/uL (ref 850–3900)
MCH: 33 pg (ref 27.0–33.0)
MCHC: 33.7 g/dL (ref 32.0–36.0)
MCV: 97.7 fL (ref 80.0–100.0)
MPV: 9.9 fL (ref 7.5–12.5)
Monocytes Relative: 6.3 %
Neutro Abs: 2902 cells/uL (ref 1500–7800)
Neutrophils Relative %: 46.8 %
Platelets: 203 10*3/uL (ref 140–400)
RBC: 3.55 10*6/uL — ABNORMAL LOW (ref 3.80–5.10)
RDW: 12.8 % (ref 11.0–15.0)
Total Lymphocyte: 44 %
WBC: 6.2 10*3/uL (ref 3.8–10.8)

## 2020-02-13 LAB — COMPREHENSIVE METABOLIC PANEL
AG Ratio: 1.5 (calc) (ref 1.0–2.5)
ALT: 9 U/L (ref 6–29)
AST: 15 U/L (ref 10–35)
Albumin: 3.7 g/dL (ref 3.6–5.1)
Alkaline phosphatase (APISO): 55 U/L (ref 37–153)
BUN: 12 mg/dL (ref 7–25)
CO2: 28 mmol/L (ref 20–32)
Calcium: 9.2 mg/dL (ref 8.6–10.4)
Chloride: 104 mmol/L (ref 98–110)
Creat: 0.74 mg/dL (ref 0.50–1.05)
Globulin: 2.5 g/dL (calc) (ref 1.9–3.7)
Glucose, Bld: 125 mg/dL — ABNORMAL HIGH (ref 65–99)
Potassium: 4.3 mmol/L (ref 3.5–5.3)
Sodium: 139 mmol/L (ref 135–146)
Total Bilirubin: 0.4 mg/dL (ref 0.2–1.2)
Total Protein: 6.2 g/dL (ref 6.1–8.1)

## 2020-02-13 LAB — C-REACTIVE PROTEIN: CRP: 17.3 mg/L — ABNORMAL HIGH (ref <8.0)

## 2020-02-17 DIAGNOSIS — M25571 Pain in right ankle and joints of right foot: Secondary | ICD-10-CM | POA: Diagnosis not present

## 2020-02-20 ENCOUNTER — Other Ambulatory Visit: Payer: Self-pay | Admitting: Nurse Practitioner

## 2020-02-21 NOTE — Telephone Encounter (Signed)
Hi [name], pls have Dr Laural Golden review rx refill thx

## 2020-02-23 DIAGNOSIS — H40053 Ocular hypertension, bilateral: Secondary | ICD-10-CM | POA: Diagnosis not present

## 2020-02-23 NOTE — Telephone Encounter (Signed)
This has ben addressed.

## 2020-03-07 ENCOUNTER — Ambulatory Visit (INDEPENDENT_AMBULATORY_CARE_PROVIDER_SITE_OTHER): Payer: BC Managed Care – PPO | Admitting: Internal Medicine

## 2020-03-07 ENCOUNTER — Encounter (INDEPENDENT_AMBULATORY_CARE_PROVIDER_SITE_OTHER): Payer: Self-pay | Admitting: Internal Medicine

## 2020-03-07 ENCOUNTER — Other Ambulatory Visit: Payer: Self-pay

## 2020-03-07 VITALS — BP 129/82 | HR 78 | Temp 97.6°F | Ht 63.0 in | Wt 176.7 lb

## 2020-03-07 DIAGNOSIS — R1010 Upper abdominal pain, unspecified: Secondary | ICD-10-CM

## 2020-03-07 MED ORDER — OXYCODONE HCL 5 MG PO TABS: 5.0000 mg | ORAL_TABLET | Freq: Four times a day (QID) | ORAL | 0 refills | Status: DC | PRN

## 2020-03-07 NOTE — Progress Notes (Signed)
Presenting complaint;  Upper abdominal pain and nausea.  Database and subjective:  Patient is 54 year old Caucasian female who was hospitalized in July last year for acute jejunitis.  GI pathogen panel was negative.  She was empirically treated with antibiotics and her recovery goes very slow.  Serum IgG and IgA were normal but IgM was low.  Sed rate was 5 and ANA was negative.  During that admission she was also found to have B12 deficiency and begun on B12 parenterally.  Her symptoms continued for several weeks.  She had follow-up CT on 10/01/2019 which revealed resolution of jejunitis/enteritis.  She therefore underwent push enteroscopy in November 2020 and biopsy from the duodenum and proximal jejunum were unremarkable.  She had mildly elevated CRP b due to rheumatoid arthritis.  It was also elevated back in 2011.  She was maintained on dicyclomine and finally started to feel better. She now returns with 3-day history of abdominal pain which is primarily located in epigastric region and radiates around the right side into hypogastric region.  She states even though location is different pain is the same kind of pain that she had before but it is not as severe.  She has developed nausea and unable to eat.  She has not lost any weight since the symptoms started.  She has not experienced vomiting fever or chills.  She is prone to constipation.  She is having to take the Colace 100 to 200 mg daily.  She denies melena or rectal bleeding. Last time she took Advil it was 1 month ago.  She says she was begun on meloxicam for arthritis.  She stopped this medication after taking full doses.  She developed rash and nausea.  She took this medication last month. She says she eats " TV meal" when at work.  Rest of the time she eats at home.   Current Medications: Outpatient Encounter Medications as of 03/07/2020  Medication Sig  . acetaminophen (TYLENOL) 325 MG tablet Take 2 tablets (650 mg total) by mouth every  6 (six) hours as needed for mild pain, fever or headache.  . albuterol (VENTOLIN HFA) 108 (90 Base) MCG/ACT inhaler SMARTSIG:2 Spray(s) Via Inhaler Every 4 Hours PRN  . augmented betamethasone dipropionate (DIPROLENE-AF) 0.05 % cream Apply 1 application topically at bedtime.   Marland Kitchen BIOTIN PO Take 5,000 mg by mouth daily.  . Cholecalciferol (VITAMIN D3) 125 MCG (5000 UT) CAPS Take 5,000 Units by mouth daily.   . cyanocobalamin (,VITAMIN B-12,) 1000 MCG/ML injection Inject 1 mL intramuscularly monthly.  . dicyclomine (BENTYL) 10 MG capsule TAKE 1 CAPSULE (10 MG TOTAL) BY MOUTH 3 (THREE) TIMES DAILY BEFORE MEALS.  Marland Kitchen estradiol (ESTRACE) 2 MG tablet Take 1 tablet (2 mg total) by mouth daily.  Marland Kitchen gabapentin (NEURONTIN) 400 MG capsule Take 1 capsule (400 mg total) by mouth at bedtime.  Marland Kitchen ibuprofen (ADVIL) 200 MG tablet Take 200 mg by mouth every 6 (six) hours as needed. Takes 3 tablets bid  . lisinopril (ZESTRIL) 5 MG tablet Take 1 tablet (5 mg total) by mouth daily. (Patient taking differently: Take 5 mg by mouth at bedtime. )  . Multiple Vitamins-Minerals (ONE-A-DAY WOMENS 50 PLUS PO) Take by mouth daily.  . ondansetron (ZOFRAN ODT) 8 MG disintegrating tablet Take 1 tablet (8 mg total) by mouth every 8 (eight) hours as needed for nausea or vomiting.  . pantoprazole (PROTONIX) 40 MG tablet TAKE 1 TABLET (40 MG TOTAL) BY MOUTH DAILY.  . meloxicam (MOBIC) 7.5 MG tablet  Take 1 tablet (7.5 mg total) by mouth daily.  Marland Kitchen oxyCODONE (ROXICODONE) 5 MG immediate release tablet Take 1 tablet (5 mg total) by mouth every 6 (six) hours as needed for severe pain. (Patient not taking: Reported on 11/16/2019)  . [DISCONTINUED] dicyclomine (BENTYL) 20 MG tablet Take 1 tablet (20 mg total) by mouth 2 (two) times daily before a meal. (Patient taking differently: Take 10 mg by mouth 4 (four) times daily -  before meals and at bedtime. )   No facility-administered encounter medications on file as of 03/07/2020.   Past Medical  History:  Diagnosis Date  . Asthma    triggered by weather changes; no inhaler  . Cancer ([place])    Cervical  . Collagen vascular disease ([place])   . Fibromyalgia   . Ganglion cyst of wrist 12/2012   left dorsal cyst  . GERD (gastroesophageal reflux disease)    TUMS as needed  . Headache(784.0)    tension/migraines  . PONV (postoperative nausea and vomiting)    also states stopped breathing after hernia surgery  . Rectal bleeding   . Rheumatoid arthritis ([employer])   . Stenosing tenosynovitis of finger 12/2012   left middle finger  . 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 [facility])    Past Surgical History:  Procedure Laterality Date  . ABDOMINAL HYSTERECTOMY  1994  . APPENDECTOMY  1996  . BIOPSY  12/01/2018   Procedure: BIOPSY;  Surgeon: Rogene Houston, MD;  [facility];  Service: Endoscopy;;  gastric   . BIOPSY N/A 10/16/2019   Procedure: DUODENUM and JEJUNUM BIOPSY;  Surgeon: Rogene Houston, MD;  [facility];  Service: Endoscopy;  Laterality: N/A;  . COLONOSCOPY N/A 05/08/2013   Procedure: COLONOSCOPY;  Surgeon: Rogene Houston, MD;  [facility];  Service: Endoscopy;  Laterality: N/A;  240  . COLONOSCOPY N/A 09/23/2015   Procedure: COLONOSCOPY;  Surgeon: Rogene Houston, MD;  [facility];  Service: Endoscopy;  Laterality: N/A;  825  . CYSTOSCOPY WITH HYDRODISTENSION AND BIOPSY  04/01/2007  . [place]   left tube and ovary removed  . ENTEROSCOPY N/A 10/16/2019   Procedure: PUSH ENTEROSCOPY;  Surgeon: Rogene Houston, MD;  [facility];  Service: Endoscopy;  Laterality: N/A;  . ESOPHAGOGASTRODUODENOSCOPY N/A 12/01/2018   Procedure: ESOPHAGOGASTRODUODENOSCOPY (EGD);  Surgeon: Rogene Houston, MD;  [facility];  Service: Endoscopy;  Laterality: N/A;  12:45  . ESOPHAGOGASTRODUODENOSCOPY (EGD) WITH PROPOFOL N/A 10/16/2019   Procedure: ESOPHAGOGASTRODUODENOSCOPY (EGD) WITH PROPOFOL;  Surgeon: Rogene Houston, MD;  [facility];  Service: Endoscopy;  Laterality: N/A;  1430pm  . GANGLION CYST EXCISION  12/17/2012   Procedure: REMOVAL GANGLION OF WRIST;  Surgeon: Wynonia Sours, MD;  Location: [place];  Service: Orthopedics;  Laterality: Left;  . HARDWARE REMOVAL Right 08/09/2014   Procedure: Placement Right Ankle Tight Rope, Removal Plate and Screws;  Surgeon: Marybelle Killings, MD;  Location: [place];  Service: Orthopedics;  Laterality: Right;  . PUBOVAGINAL SLING  2010  . rt ankle surgery    . SALPINGOOPHORECTOMY  1996   right  . TRIGGER FINGER RELEASE  12/17/2012   Procedure: RELEASE TRIGGER FINGER/A-1 PULLEY;  Surgeon: Wynonia Sours, MD;  Location: [place];  Service: Orthopedics;  Laterality: Left;  . TUBAL LIGATION  1992  . VAGINA RECONSTRUCTION SURGERY     x 5 since 2010  .  VENTRAL HERNIA REPAIR  09/06/2006      Objective: Blood pressure 129/82, pulse 78, temperature 97.6 F (36.4 C), temperature source Temporal, height 5' 3" (1.6 m), weight 176 lb 11.2 oz (80.2 kg). Patient is alert and in no acute distress. Patient is wearing a facial mask. Conjunctiva is pink. Sclera is nonicteric Oropharyngeal mucosa is normal. No neck masses or thyromegaly noted. Cardiac exam with regular rhythm normal S1 and S2. No murmur or gallop noted. Lungs are clear to auscultation. Abdomen is symmetrical.  Bowel sounds normal.  No bruit noted.  She has moderate midepigastric tenderness without guarding or rebound.  Mild tenderness also noted in right upper and right lower quadrant.  No organomegaly or masses. No LE edema or clubbing noted.  Labs/studies Results:  CBC Latest Ref Rng & Units 02/12/2020 11/04/2019 09/30/2019  WBC 3.8 - 10.8 Thousand/uL 6.2 7.8 7.5  Hemoglobin 11.7 - 15.5 g/dL 11.7 11.7 12.1  Hematocrit 35.0 - 45.0 % 34.7(L) 34.1(L) 36.0  Platelets 140 - 400 Thousand/uL 203 200 215    CMP Latest Ref Rng & Units 02/12/2020 09/30/2019 06/22/2019   Glucose 65 - 99 mg/dL 125(H) 86 113(H)  BUN 7 - 25 mg/dL _0 Creatinine 0.50 - 1.05 mg/dL 0.74 0.68 0.66  Sodium 135 - 146 mmol/L 139 139 139  Potassium 3.5 - 5.3 mmol/L 4.3 4.5 3.5  Chloride 98 - 110 mmol/L 104 105 107  CO2 20 - 32 mmol/L _1 Calcium 8.6 - 10.4 mg/dL 9.2 9.2 7.9(L)  Total Protein 6.1 - 8.1 g/dL 6.2 6.3 -  Total Bilirubin 0.2 - 1.2 mg/dL 0.4 0.4 -  Alkaline Phos 38 - 126 U/L - - -  AST 10 - 35 U/L 15 11 -  ALT 6 - 29 U/L 9 7 -    Hepatic Function Latest Ref Rng & Units 02/12/2020 09/30/2019 06/20/2019  Total Protein 6.1 - 8.1 g/dL 6.2 6.3 5.1(L)  Albumin 3.5 - 5.0 g/dL - - 2.8(L)  AST 10 - 35 U/L 15 11 12(L)  ALT 6 - 29 U/L _2 Alk Phosphatase 38 - 126 U/L - - 41  Total Bilirubin 0.2 - 1.2 mg/dL 0.4 0.4 0.4  Bilirubin, Direct - - - -    Lab Results  Component Value Date   CRP 17.3 (H) 02/12/2020     Assessment:  #1.  Recurrent abdominal pain.  She was diagnosed with acute jejunitis in July last year when she was admitted for pain vomiting and leukocytosis and had extensive changes on CT.  Work-up was negative for infection or autoimmune process or vasculitis.  Follow-up CT in October 2020 revealed resolution of jejunitis but she was still symptomatic therefore additional biopsy was obtained and reveals no inflammation.  She had low IgM questionable significance. If her jejunitis has recurred we will repeat immunoglobulins along with IgG4. It remains to be seen if lisinopril is the culprit.  #2.  Mildly elevated CRP.  She has had elevated CRP dating back to 2011.  CRP level has been relatively stable over the last 1 year.  I believe sources rheumatoid arthritis and not the GI tract.  Plan:  Patient will go to the lab for CBC with differential, sed rate and comprehensive chemistry panel. Abdominopelvic CT with contrast as soon as possible. Oxycodone 5 mg by mouth every 6 hours as needed.  Prescription given for 28 doses. Patient's overdose risk  score is 210. We will continue to use ondansetron on  as needed basis. Patient advised to be on mechanical soft diet. If she has fever she will call office.  If pain is not controlled with medication she report to emergency room. Recommendations to follow.

## 2020-03-07 NOTE — Patient Instructions (Signed)
Physician will call the results of blood work and CT scan. Notify if you have temp greater than 101F

## 2020-03-08 LAB — CBC WITH DIFFERENTIAL/PLATELET
Absolute Monocytes: 585 cells/uL (ref 200–950)
Basophils Absolute: 40 cells/uL (ref 0–200)
Basophils Relative: 0.5 %
Eosinophils Absolute: 111 cells/uL (ref 15–500)
Eosinophils Relative: 1.4 %
HCT: 34.3 % — ABNORMAL LOW (ref 35.0–45.0)
Hemoglobin: 11.7 g/dL (ref 11.7–15.5)
Lymphs Abs: 2820 cells/uL (ref 850–3900)
MCH: 33.3 pg — ABNORMAL HIGH (ref 27.0–33.0)
MCHC: 34.1 g/dL (ref 32.0–36.0)
MCV: 97.7 fL (ref 80.0–100.0)
MPV: 9.9 fL (ref 7.5–12.5)
Monocytes Relative: 7.4 %
Neutro Abs: 4345 cells/uL (ref 1500–7800)
Neutrophils Relative %: 55 %
Platelets: 216 10*3/uL (ref 140–400)
RBC: 3.51 10*6/uL — ABNORMAL LOW (ref 3.80–5.10)
RDW: 12.8 % (ref 11.0–15.0)
Total Lymphocyte: 35.7 %
WBC: 7.9 10*3/uL (ref 3.8–10.8)

## 2020-03-08 LAB — COMPREHENSIVE METABOLIC PANEL
AG Ratio: 1.5 (calc) (ref 1.0–2.5)
ALT: 8 U/L (ref 6–29)
AST: 13 U/L (ref 10–35)
Albumin: 3.8 g/dL (ref 3.6–5.1)
Alkaline phosphatase (APISO): 56 U/L (ref 37–153)
BUN: 14 mg/dL (ref 7–25)
CO2: 30 mmol/L (ref 20–32)
Calcium: 9.3 mg/dL (ref 8.6–10.4)
Chloride: 105 mmol/L (ref 98–110)
Creat: 0.71 mg/dL (ref 0.50–1.05)
Globulin: 2.6 g/dL (calc) (ref 1.9–3.7)
Glucose, Bld: 109 mg/dL (ref 65–139)
Potassium: 4.2 mmol/L (ref 3.5–5.3)
Sodium: 139 mmol/L (ref 135–146)
Total Bilirubin: 0.3 mg/dL (ref 0.2–1.2)
Total Protein: 6.4 g/dL (ref 6.1–8.1)

## 2020-03-08 LAB — SEDIMENTATION RATE: Sed Rate: 11 mm/h (ref 0–30)

## 2020-03-14 ENCOUNTER — Ambulatory Visit (HOSPITAL_COMMUNITY)
Admission: RE | Admit: 2020-03-14 | Discharge: 2020-03-14 | Disposition: A | Discharge status: Home / Self Care | Payer: BC Managed Care – PPO | Source: Ambulatory Visit | Attending: Internal Medicine | Admitting: Internal Medicine

## 2020-03-14 ENCOUNTER — Other Ambulatory Visit: Payer: Self-pay

## 2020-03-14 DIAGNOSIS — R1010 Upper abdominal pain, unspecified: Secondary | ICD-10-CM | POA: Insufficient documentation

## 2020-03-14 DIAGNOSIS — R109 Unspecified abdominal pain: Secondary | ICD-10-CM | POA: Diagnosis not present

## 2020-03-14 MED ORDER — IOHEXOL 300 MG/ML  SOLN
100.0000 mL | Freq: Once | INTRAMUSCULAR | Status: AC | PRN
  Administered 2020-03-14: 100 mL via INTRAVENOUS

## 2020-03-15 ENCOUNTER — Encounter (INDEPENDENT_AMBULATORY_CARE_PROVIDER_SITE_OTHER): Payer: Self-pay

## 2020-03-16 ENCOUNTER — Telehealth (INDEPENDENT_AMBULATORY_CARE_PROVIDER_SITE_OTHER): Payer: Self-pay | Admitting: *Deleted

## 2020-03-16 NOTE — Telephone Encounter (Signed)
Copied form MY CHART for Dr.Rehman to review. Patient will be contacted with his recommendations.      once addreIf everything looks clear from what I have read. Why do I still have  episode's? Why is my blood levels not normal? I would like to fix all my blood levels than are below normal and above normal. I have been doing research and some of my blood levels is due to not producing enough of B12 and a thinning wall in my stomach and I also found out through research that I'm  anaemia. And I have read that some of my blood levels could be a blood disorder...like cancer. I hope that isn't true. But from my research I'm worried because my blood levels aren't normal and I still have have episode's. What can we do? I stay sleeping and tired all the time and I have read that it could be because of low red blood levels. Next time I have a episode...do I need to go to the ER so they can see what's going on at the time of the episode because  the CT scan didn't show nothing?? From what I read. What is the next step?

## 2020-04-01 ENCOUNTER — Encounter (INDEPENDENT_AMBULATORY_CARE_PROVIDER_SITE_OTHER): Payer: Self-pay

## 2020-04-04 ENCOUNTER — Other Ambulatory Visit (INDEPENDENT_AMBULATORY_CARE_PROVIDER_SITE_OTHER): Payer: Self-pay | Admitting: *Deleted

## 2020-04-04 DIAGNOSIS — D649 Anemia, unspecified: Secondary | ICD-10-CM

## 2020-04-04 DIAGNOSIS — R531 Weakness: Secondary | ICD-10-CM

## 2020-04-05 DIAGNOSIS — E538 Deficiency of other specified B group vitamins: Secondary | ICD-10-CM | POA: Diagnosis not present

## 2020-04-05 DIAGNOSIS — R531 Weakness: Secondary | ICD-10-CM | POA: Diagnosis not present

## 2020-04-05 DIAGNOSIS — R5383 Other fatigue: Secondary | ICD-10-CM | POA: Diagnosis not present

## 2020-04-05 DIAGNOSIS — Z79899 Other long term (current) drug therapy: Secondary | ICD-10-CM | POA: Diagnosis not present

## 2020-04-05 DIAGNOSIS — N200 Calculus of kidney: Secondary | ICD-10-CM | POA: Diagnosis not present

## 2020-04-05 DIAGNOSIS — D649 Anemia, unspecified: Secondary | ICD-10-CM | POA: Diagnosis not present

## 2020-04-05 DIAGNOSIS — Z6831 Body mass index (BMI) 31.0-31.9, adult: Secondary | ICD-10-CM | POA: Diagnosis not present

## 2020-04-06 LAB — TSH: TSH: 3.59 mIU/L

## 2020-04-06 LAB — FOLATE: Folate: 15.5 ng/mL

## 2020-04-06 LAB — VITAMIN B12: Vitamin B-12: 625 pg/mL (ref 200–1100)

## 2020-04-06 LAB — COPPER, SERUM: Copper: 120 ug/dL (ref 70–175)

## 2020-04-13 ENCOUNTER — Encounter: Payer: Self-pay | Admitting: Urology

## 2020-04-13 ENCOUNTER — Other Ambulatory Visit: Payer: Self-pay

## 2020-04-13 ENCOUNTER — Ambulatory Visit (INDEPENDENT_AMBULATORY_CARE_PROVIDER_SITE_OTHER): Payer: BC Managed Care – PPO | Admitting: Urology

## 2020-04-13 VITALS — BP 136/88 | HR 71 | Temp 98.1°F | Ht 63.0 in | Wt 175.0 lb

## 2020-04-13 DIAGNOSIS — R350 Frequency of micturition: Secondary | ICD-10-CM | POA: Diagnosis not present

## 2020-04-13 LAB — POCT URINALYSIS DIPSTICK
Blood, UA: NEGATIVE
Glucose, UA: NEGATIVE
Leukocytes, UA: NEGATIVE
Nitrite, UA: NEGATIVE
Protein, UA: NEGATIVE
Spec Grav, UA: >=1.03 — AB (ref 1.010–1.025)
Urobilinogen, UA: NEGATIVE E.U./dL — AB
pH, UA: 5 (ref 5.0–8.0)

## 2020-04-13 LAB — BLADDER SCAN AMB NON-IMAGING: Scan Result: 55.3

## 2020-04-13 IMAGING — CT CT ABD-PELV W/ CM
2 of 5 series · 17 of 46 positions shown, 19 images · IV contrast (omnipaque)
Comparison: 06/23/2019

CLINICAL DATA: Recurrent abdominal pain and nausea and vomiting
over past year. Personal history of cervical carcinoma.

EXAM:
CT ABDOMEN AND PELVIS WITH CONTRAST
TECHNIQUE: Multidetector CT imaging of the abdomen and pelvis was performed
using the standard protocol following bolus administration of
intravenous contrast.
CONTRAST:  100mL OMNIPAQUE IOHEXOL 300 MG/ML  SOLN

[Series 2: axial st · axial · 0.72mm/px · z∈[+878,+1298]mm · 14 of 98 slices shown, 16 images]
[im 7/98  soft-tissue]
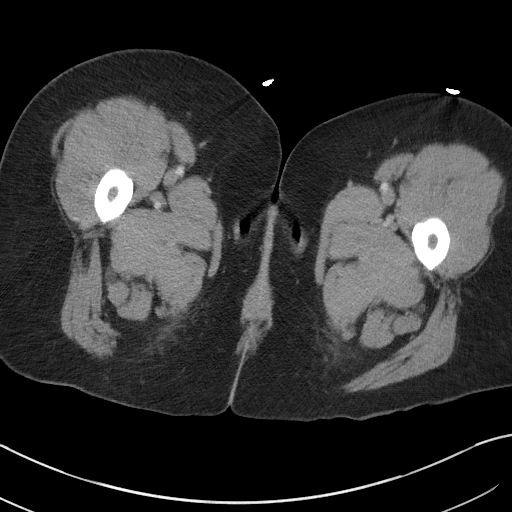
[im 7/98  bone]
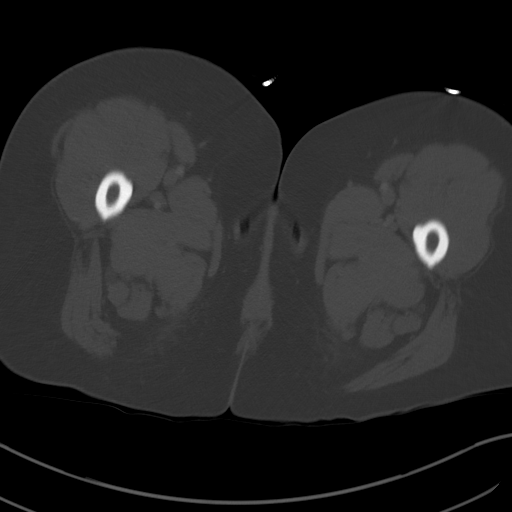
[im 13/98  soft-tissue]
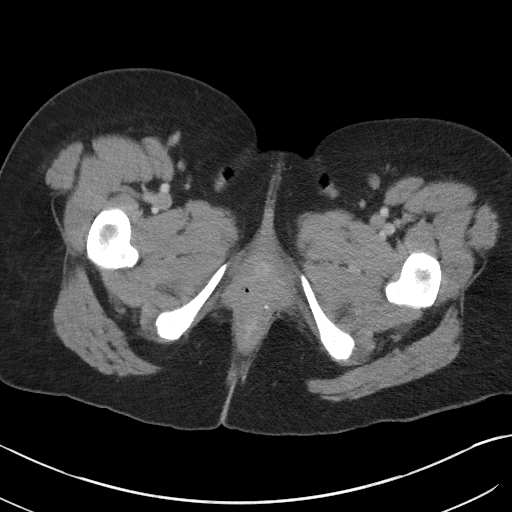
[im 20/98  soft-tissue]
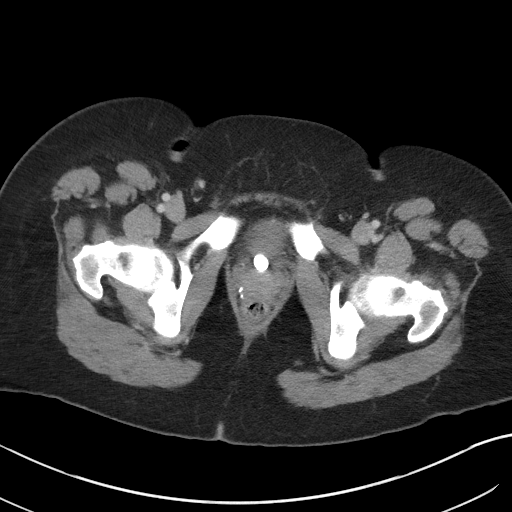
[im 26/98  soft-tissue]
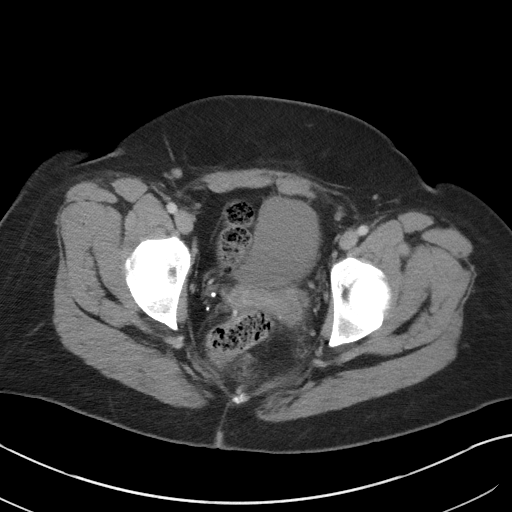
[im 33/98  soft-tissue]
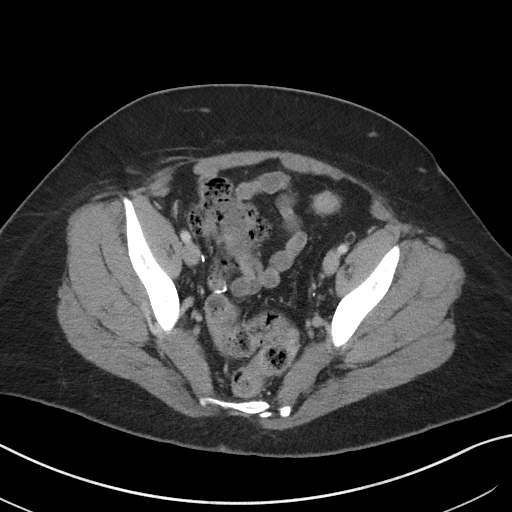
[im 39/98  soft-tissue]
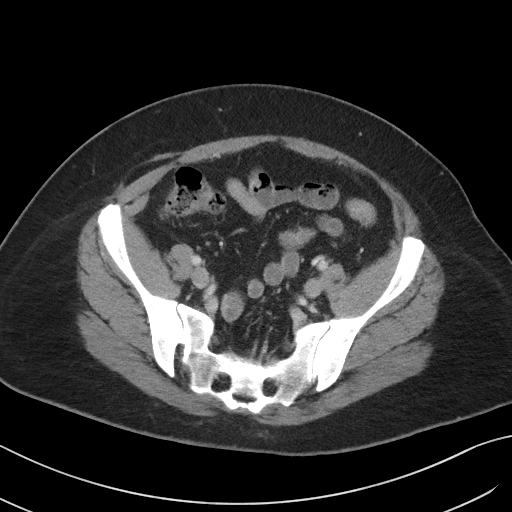
[im 46/98  soft-tissue]
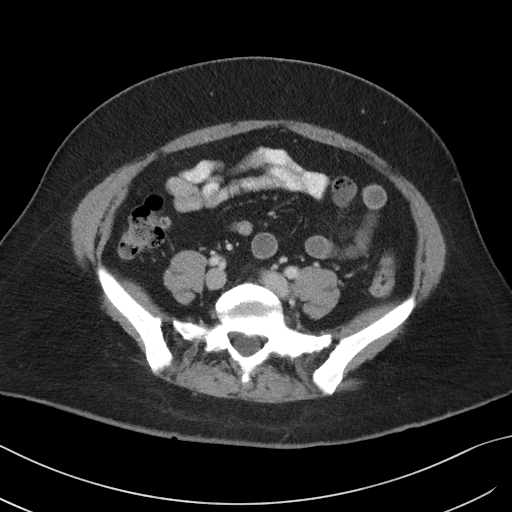
[im 52/98  soft-tissue]
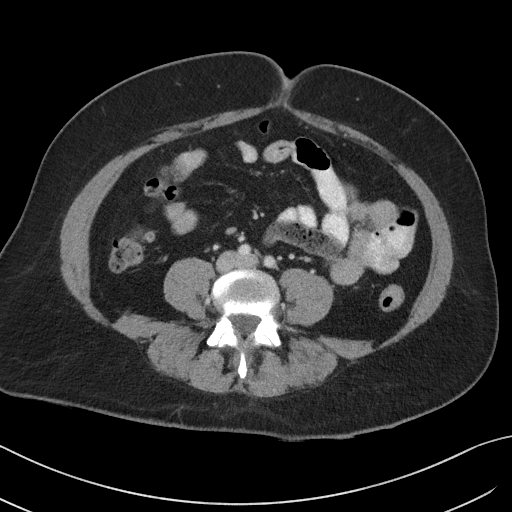
[im 59/98  soft-tissue]
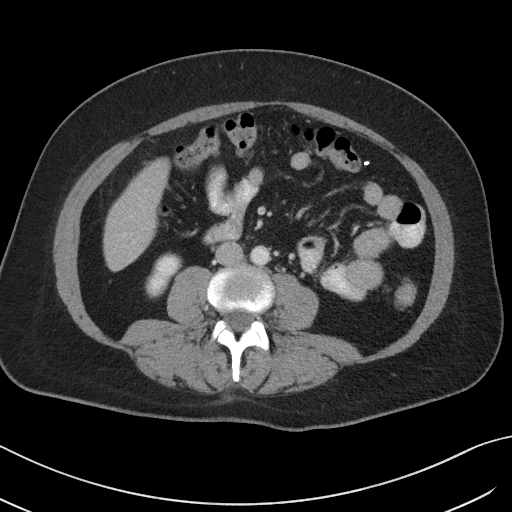
[im 59/98  bone]
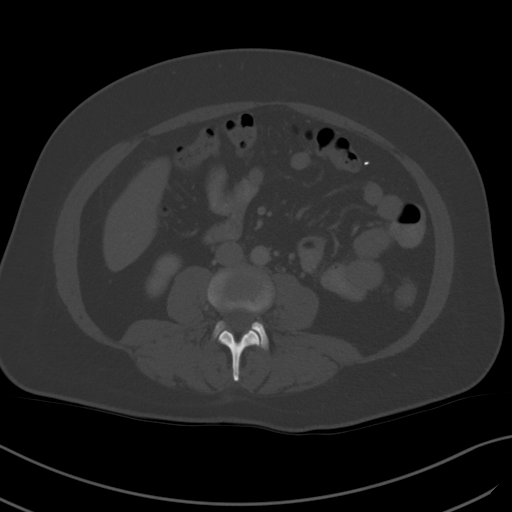
[im 65/98  soft-tissue]
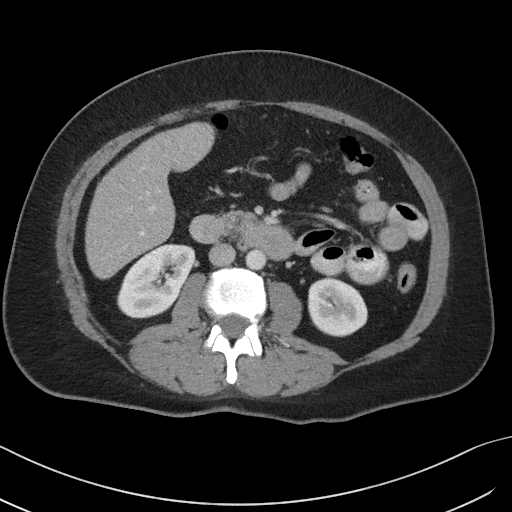
[im 72/98  soft-tissue]
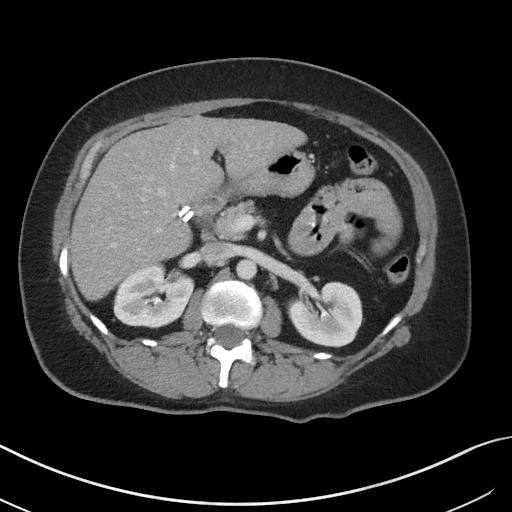
[im 78/98  soft-tissue]
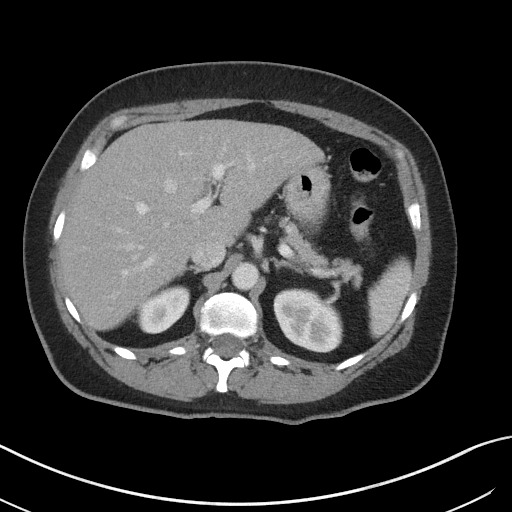
[im 85/98  soft-tissue]
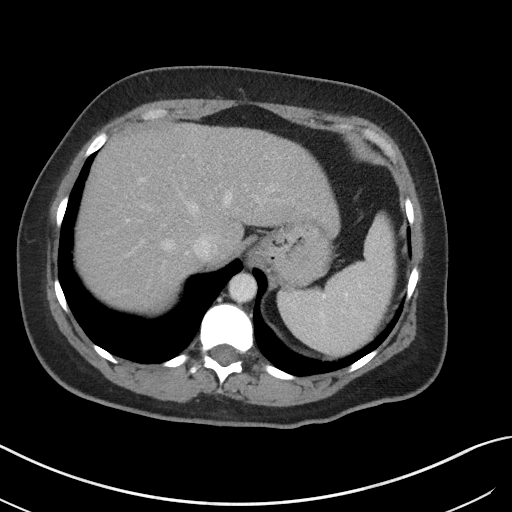
[im 91/98  soft-tissue]
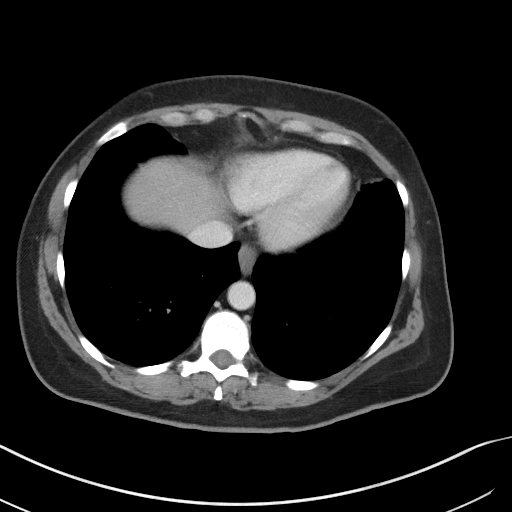

[Series 6: coronal st · coronal · 0.76mm/px · 3 of 117 slices shown]
[im 39/117  soft-tissue]
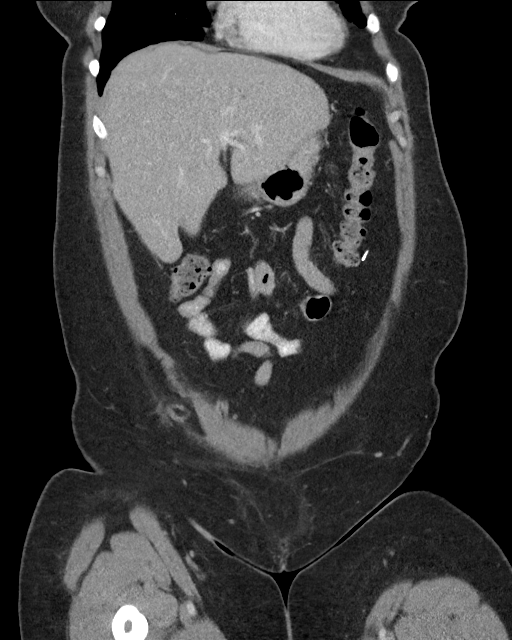
[im 52/117  soft-tissue]
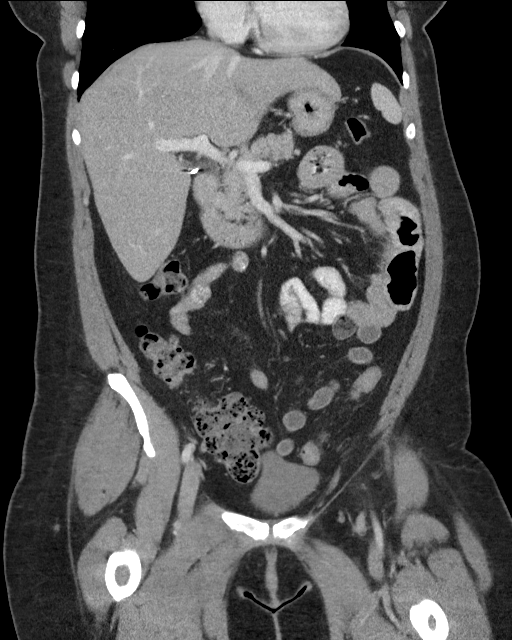
[im 65/117  soft-tissue]
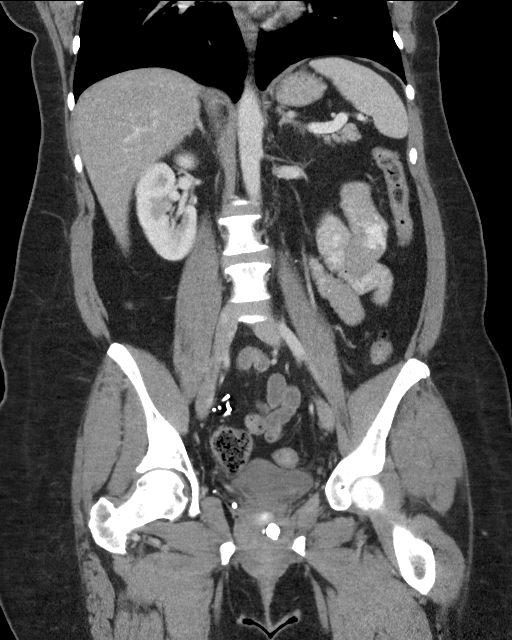

[17 of 46 positions shown; findings below may reference images not displayed]

FINDINGS: Lower Chest: No acute findings.

Hepatobiliary: No hepatic masses identified. Prior cholecystectomy.
No evidence of biliary obstruction.

Pancreas:  No mass or inflammatory changes.

Spleen: Within normal limits in size and appearance.

Adrenals/Urinary Tract: No masses identified. No evidence of
hydronephrosis.

Stomach/Bowel: No evidence of obstruction, inflammatory process or
abnormal fluid collections.

Vascular/Lymphatic: No pathologically enlarged lymph nodes. No
abdominal aortic aneurysm.

Reproductive:  No mass or other significant abnormality.

Other:  None.

Musculoskeletal:  No suspicious bone lesions identified.
IMPRESSION: Negative. No acute findings or other significant abnormality.

## 2020-04-13 MED ORDER — MIRABEGRON ER 25 MG PO TB24
25.0000 mg | ORAL_TABLET | Freq: Every day | ORAL | 0 refills | Status: DC
Start: 2020-04-13 — End: 2020-09-14

## 2020-04-13 NOTE — Progress Notes (Signed)
Urological Symptom Review  Patient is experiencing the following symptoms: Frequent urination Getting up at night Trouble starting stream Have to strain to urinate Blood in urine Kidney Stone   Review of Systems  Gastrointestinal (upper)  : Indigestion/heartburn  Gastrointestinal (lower) : Constipation  Constitutional : Fatigue  Skin: Skin rash/lesion Itching  Eyes: Negative for eye symptoms  Ear/Nose/Throat : Negative for Ear/Nose/Throat symptoms  Hematologic/Lymphatic: Negative for Hematologic/Lymphatic symptoms  Cardiovascular : Leg swelling  Respiratory : Shortness of breath  Endocrine: Negative for endocrine symptoms  Musculoskeletal: Joint pain  Neurological: Headaches  Psychologic: Negative for psychiatric symptoms

## 2020-04-13 NOTE — Patient Instructions (Signed)

## 2020-04-13 NOTE — Progress Notes (Signed)
04/13/2020 11:22 AM   [name] 12-May-1966 [id number]  Referring provider: Asencion Noble, MD [address] [place],  [place] [address]  Urinary frequency  HPI: Ms [name] is a 54yo here for evaluation of urinary frequency and abnormal urethral calcification on recent CT. She has a hx of IC and had a mid urethral sling placed in 2010 and then was removed in 2011. She had a urethral bulking agent placed at the time of sling removal. She has urinary frequency. NO dysuria. She has straining with urination, nocturia 3-4x, and a weak stream. No hematuria. No recurrent urinary tract infections. No SUI.  She was previously seen by Dr. Amalia Hailey in 2008   PMH: Past Medical History:  Diagnosis Date  . Arthritis   . Asthma    triggered by weather changes; no inhaler  . Cancer ([place])    Cervical  . Collagen vascular disease ([place])   . Fibromyalgia   . Ganglion cyst of wrist 12/2012   left dorsal cyst  . GERD (gastroesophageal reflux disease)    TUMS as needed  . Headache(784.0)    tension/migraines  . Hypercholesteremia   . Hypertension   . PONV (postoperative nausea and vomiting)    also states stopped breathing after hernia surgery  . Rectal bleeding   . Rheumatoid arthritis ([place])   . Stenosing tenosynovitis of finger 12/2012   left middle finger  . 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 [facility])     Surgical History: Past Surgical History:  Procedure Laterality Date  . ABDOMINAL HYSTERECTOMY  1994  . APPENDECTOMY  1996  . BIOPSY  12/01/2018   Procedure: BIOPSY;  Surgeon: Rogene Houston, MD;  [facility];  Service: Endoscopy;;  gastric   . BIOPSY N/A 10/16/2019   Procedure: DUODENUM and JEJUNUM BIOPSY;  Surgeon: Rogene Houston, MD;  [facility];  Service: Endoscopy;  Laterality: N/A;  . COLONOSCOPY N/A 05/08/2013   Procedure: COLONOSCOPY;  Surgeon: Rogene Houston, MD;  [facility];  Service: Endoscopy;  Laterality: N/A;  240  .  COLONOSCOPY N/A 09/23/2015   Procedure: COLONOSCOPY;  Surgeon: Rogene Houston, MD;  [facility];  Service: Endoscopy;  Laterality: N/A;  825  . CYSTOSCOPY WITH HYDRODISTENSION AND BIOPSY  04/01/2007  . [place]   left tube and ovary removed  . ENTEROSCOPY N/A 10/16/2019   Procedure: PUSH ENTEROSCOPY;  Surgeon: Rogene Houston, MD;  [facility];  Service: Endoscopy;  Laterality: N/A;  . ESOPHAGOGASTRODUODENOSCOPY N/A 12/01/2018   Procedure: ESOPHAGOGASTRODUODENOSCOPY (EGD);  Surgeon: Rogene Houston, MD;  [facility];  Service: Endoscopy;  Laterality: N/A;  12:45  . ESOPHAGOGASTRODUODENOSCOPY (EGD) WITH PROPOFOL N/A 10/16/2019   Procedure: ESOPHAGOGASTRODUODENOSCOPY (EGD) WITH PROPOFOL;  Surgeon: Rogene Houston, MD;  [facility];  Service: Endoscopy;  Laterality: N/A;  1430pm  . GANGLION CYST EXCISION  12/17/2012   Procedure: REMOVAL GANGLION OF WRIST;  Surgeon: Wynonia Sours, MD;  Location: [place];  Service: Orthopedics;  Laterality: Left;  . HARDWARE REMOVAL Right 08/09/2014   Procedure: Placement Right Ankle Tight Rope, Removal Plate and Screws;  Surgeon: Marybelle Killings, MD;  Location: [place];  Service: Orthopedics;  Laterality: Right;  . PUBOVAGINAL SLING  2010  . rt ankle surgery    . SALPINGOOPHORECTOMY  1996   right  . TRIGGER FINGER RELEASE  12/17/2012   Procedure: RELEASE TRIGGER FINGER/A-1 PULLEY;  Surgeon: Wynonia Sours, MD;  Location: [place];  Service: Orthopedics;  Laterality: Left;  . TUBAL LIGATION  1992  . VAGINA RECONSTRUCTION SURGERY     x 5 since 2010  . VENTRAL HERNIA REPAIR  09/06/2006    Home Medications:  Allergies as of 04/13/2020      Reactions   Imitrex [sumatriptan]    Causes a migraine   Cymbalta [duloxetine Hcl] Rash   Latex Rash   Propoxyphene N-acetaminophen Nausea And Vomiting, Rash   Tramadol Rash      Medication List       Accurate as of [name] 5,  2021 11:22 AM. If you have any questions, ask your nurse or doctor.        acetaminophen 325 MG tablet Commonly known as: TYLENOL Take 2 tablets (650 mg total) by mouth every 6 (six) hours as needed for mild pain, fever or headache.   albuterol 108 (90 Base) MCG/ACT inhaler Commonly known as: VENTOLIN HFA SMARTSIG:2 Spray(s) Via Inhaler Every 4 Hours PRN   augmented betamethasone dipropionate 0.05 % cream Commonly known as: DIPROLENE-AF Apply 1 application topically at bedtime.   BIOTIN PO Take 5,000 mg by mouth daily.   cyanocobalamin 1000 MCG/ML injection Commonly known as: (VITAMIN B-12) Inject 1 mL intramuscularly monthly.   dicyclomine 10 MG capsule Commonly known as: BENTYL TAKE 1 CAPSULE (10 MG TOTAL) BY MOUTH 3 (THREE) TIMES DAILY BEFORE MEALS.   estradiol 2 MG tablet Commonly known as: ESTRACE Take 1 tablet (2 mg total) by mouth daily.   gabapentin 400 MG capsule Commonly known as: NEURONTIN Take 1 capsule (400 mg total) by mouth at bedtime.   ibuprofen 200 MG tablet Commonly known as: ADVIL Take 200 mg by mouth every 6 (six) hours as needed. Takes 3 tablets bid   lisinopril 5 MG tablet Commonly known as: ZESTRIL Take 1 tablet (5 mg total) by mouth daily. What changed: when to take this   ondansetron 8 MG disintegrating tablet Commonly known as: Zofran ODT Take 1 tablet (8 mg total) by mouth every 8 (eight) hours as needed for nausea or vomiting.   ONE-A-DAY WOMENS 50 PLUS PO Take by mouth daily.   oxyCODONE 5 MG immediate release tablet Commonly known as: Roxicodone Take 1 tablet (5 mg total) by mouth every 6 (six) hours as needed for severe pain.   pantoprazole 40 MG tablet Commonly known as: PROTONIX TAKE 1 TABLET (40 MG TOTAL) BY MOUTH DAILY.   Vitamin D3 125 MCG (5000 UT) Caps Take 5,000 Units by mouth daily.       Allergies:  Allergies  Allergen Reactions  . Imitrex [Sumatriptan]     Causes a migraine  . Cymbalta [Duloxetine Hcl]  Rash  . Latex Rash  . Propoxyphene N-Acetaminophen Nausea And Vomiting and Rash  . Tramadol Rash    Family History: Family History  Problem Relation Age of Onset  . Lung cancer Mother   . Cervical cancer Mother   . Heart disease Father   . Leukemia Father   . Cancer Maternal Grandmother        bladder and colon cancer    Social History:  reports that she has never smoked. She has never used smokeless tobacco. She reports that she does not drink alcohol or use drugs.  ROS: All other review of systems were reviewed and are negative except what is noted above in HPI  Physical Exam: BP 136/88   Pulse 71   Temp 98.1  F (36.7 C)   Ht 5\' 3"  (1.6 m)   Wt 175 lb (79.4 kg)   BMI 31.00 kg/m   Constitutional:  Alert and oriented, No acute distress. HEENT: [place] AT, moist mucus membranes.  Trachea midline, no masses. Cardiovascular: No clubbing, cyanosis, or edema. Respiratory: Normal respiratory effort, no increased work of breathing. GI: Abdomen is soft, nontender, nondistended, no abdominal masses GU: No CVA tenderness. No vaginal atrophy. No cystocele rectocele. No urethral hypermobility. Calcification felt and tender on lateral aspects of uretrha Lymph: No cervical or inguinal lymphadenopathy. Skin: No rashes, bruises or suspicious lesions. Neurologic: Grossly intact, no focal deficits, moving all 4 extremities. Psychiatric: Normal mood and affect.  Laboratory Data: Lab Results  Component Value Date   WBC 7.9 03/07/2020   HGB 11.7 03/07/2020   HCT 34.3 (L) 03/07/2020   MCV 97.7 03/07/2020   PLT 216 03/07/2020    Lab Results  Component Value Date   CREATININE 0.71 03/07/2020    No results found for: PSA  No results found for: TESTOSTERONE  No results found for: HGBA1C  Urinalysis    Component Value Date/Time   COLORURINE YELLOW 06/18/2019 2338   APPEARANCEUR CLEAR 06/18/2019 2338   LABSPEC >1.046 (H) 06/18/2019 2338   PHURINE 6.0 06/18/2019 [place] 06/18/2019 2338   HGBUR NEGATIVE 06/18/2019 2338   BILIRUBINUR NEGATIVE 06/18/2019 2338   KETONESUR NEGATIVE 06/18/2019 2338   PROTEINUR NEGATIVE 06/18/2019 2338   UROBILINOGEN 0.2 10/14/2013 1144   NITRITE NEGATIVE 06/18/2019 2338   LEUKOCYTESUR NEGATIVE 06/18/2019 2338    Lab Results  Component Value Date   BACTERIA RARE (A) 03/14/2017    Pertinent Imaging: CT abd pelvis: images reviewed and discussed with patient.  Results for orders placed during the hospital encounter of 06/18/19  DG Abdomen 1 View   Narrative CLINICAL DATA:  No bowel movement for 2 weeks. Abdominal pain.  EXAM: ABDOMEN - 1 VIEW  COMPARISON:  None.  FINDINGS: Small to moderate stool in the rectum, small volume of stool elsewhere. No gaseous small bowel dilatation. No evidence of free air. Multiple surgical clips in the pelvis, cholecystectomy clips in the right upper quadrant. Multiple pelvic calcifications, typically phleboliths. No acute osseous abnormalities.  IMPRESSION: Small colonic stool burden, with moderate stool in the rectum. No evidence of obstruction.   Electronically Signed   By: Keith Rake M.D.   On: 06/18/2019 23:46    No results found for this or any previous visit. No results found for this or any previous visit. No results found for this or any previous visit. No results found for this or any previous visit. No results found for this or any previous visit. No results found for this or any previous visit. No results found for this or any previous visit.  Assessment & Plan:    1. Urine frequency -likely related to hx of IC and small capacity bladder. We will trial mirabegron 25mg  daily - POCT urinalysis dipstick  2. Urethral calcifications -we discussed the various causes and the workup. I will refer her to Dr. Matilde Sprang.       No follow-ups on file.  Nicolette Bang, MD  [facility] [place]

## 2020-04-16 ENCOUNTER — Other Ambulatory Visit: Payer: Self-pay | Admitting: Cardiovascular Disease

## 2020-04-17 ENCOUNTER — Encounter: Payer: Self-pay | Admitting: Urology

## 2020-05-18 DIAGNOSIS — N3010 Interstitial cystitis (chronic) without hematuria: Secondary | ICD-10-CM | POA: Diagnosis not present

## 2020-06-01 ENCOUNTER — Ambulatory Visit: Payer: BC Managed Care – PPO | Admitting: Urology

## 2020-06-08 ENCOUNTER — Other Ambulatory Visit: Payer: Self-pay

## 2020-06-08 ENCOUNTER — Encounter (HOSPITAL_COMMUNITY): Payer: Self-pay

## 2020-06-08 ENCOUNTER — Emergency Department (HOSPITAL_COMMUNITY)
Admission: EM | Admit: 2020-06-08 | Discharge: 2020-06-08 | Disposition: A | Discharge status: Home / Self Care | Payer: BC Managed Care – PPO | Attending: Emergency Medicine | Admitting: Emergency Medicine

## 2020-06-08 ENCOUNTER — Emergency Department (HOSPITAL_COMMUNITY): Payer: BC Managed Care – PPO

## 2020-06-08 DIAGNOSIS — Z91040 Latex allergy status: Secondary | ICD-10-CM | POA: Insufficient documentation

## 2020-06-08 DIAGNOSIS — J45909 Unspecified asthma, uncomplicated: Secondary | ICD-10-CM | POA: Insufficient documentation

## 2020-06-08 DIAGNOSIS — R519 Headache, unspecified: Secondary | ICD-10-CM | POA: Diagnosis not present

## 2020-06-08 DIAGNOSIS — K838 Other specified diseases of biliary tract: Secondary | ICD-10-CM | POA: Diagnosis not present

## 2020-06-08 DIAGNOSIS — R1010 Upper abdominal pain, unspecified: Secondary | ICD-10-CM | POA: Insufficient documentation

## 2020-06-08 DIAGNOSIS — I10 Essential (primary) hypertension: Secondary | ICD-10-CM | POA: Diagnosis not present

## 2020-06-08 DIAGNOSIS — N210 Calculus in bladder: Secondary | ICD-10-CM | POA: Diagnosis not present

## 2020-06-08 DIAGNOSIS — R112 Nausea with vomiting, unspecified: Secondary | ICD-10-CM | POA: Insufficient documentation

## 2020-06-08 DIAGNOSIS — Z9049 Acquired absence of other specified parts of digestive tract: Secondary | ICD-10-CM | POA: Diagnosis not present

## 2020-06-08 DIAGNOSIS — Z79899 Other long term (current) drug therapy: Secondary | ICD-10-CM | POA: Insufficient documentation

## 2020-06-08 DIAGNOSIS — Z90710 Acquired absence of both cervix and uterus: Secondary | ICD-10-CM | POA: Diagnosis not present

## 2020-06-08 LAB — URINALYSIS, ROUTINE W REFLEX MICROSCOPIC
Bilirubin Urine: NEGATIVE
Glucose, UA: NEGATIVE mg/dL
Ketones, ur: NEGATIVE mg/dL
Leukocytes,Ua: NEGATIVE
Nitrite: NEGATIVE
Protein, ur: NEGATIVE mg/dL
Specific Gravity, Urine: 1.02 (ref 1.005–1.030)
pH: 5 (ref 5.0–8.0)

## 2020-06-08 LAB — CBC WITH DIFFERENTIAL/PLATELET
Abs Immature Granulocytes: 0.04 10*3/uL (ref 0.00–0.07)
Basophils Absolute: 0 10*3/uL (ref 0.0–0.1)
Basophils Relative: 0 %
Eosinophils Absolute: 0.2 10*3/uL (ref 0.0–0.5)
Eosinophils Relative: 2 %
HCT: 39.3 % (ref 36.0–46.0)
Hemoglobin: 12.8 g/dL (ref 12.0–15.0)
Immature Granulocytes: 0 %
Lymphocytes Relative: 18 %
Lymphs Abs: 2.1 10*3/uL (ref 0.7–4.0)
MCH: 33.5 pg (ref 26.0–34.0)
MCHC: 32.6 g/dL (ref 30.0–36.0)
MCV: 102.9 fL — ABNORMAL HIGH (ref 80.0–100.0)
Monocytes Absolute: 0.8 10*3/uL (ref 0.1–1.0)
Monocytes Relative: 7 %
Neutro Abs: 8.6 10*3/uL — ABNORMAL HIGH (ref 1.7–7.7)
Neutrophils Relative %: 73 %
Platelets: 209 10*3/uL (ref 150–400)
RBC: 3.82 MIL/uL — ABNORMAL LOW (ref 3.87–5.11)
RDW: 12.8 % (ref 11.5–15.5)
WBC: 11.8 10*3/uL — ABNORMAL HIGH (ref 4.0–10.5)
nRBC: 0 % (ref 0.0–0.2)

## 2020-06-08 LAB — COMPREHENSIVE METABOLIC PANEL
ALT: 12 U/L (ref 0–44)
AST: 16 U/L (ref 15–41)
Albumin: 3.7 g/dL (ref 3.5–5.0)
Alkaline Phosphatase: 58 U/L (ref 38–126)
Anion gap: 12 (ref 5–15)
BUN: 15 mg/dL (ref 6–20)
CO2: 25 mmol/L (ref 22–32)
Calcium: 8.8 mg/dL — ABNORMAL LOW (ref 8.9–10.3)
Chloride: 101 mmol/L (ref 98–111)
Creatinine, Ser: 0.81 mg/dL (ref 0.44–1.00)
GFR calc Af Amer: >60 mL/min (ref >60)
GFR calc non Af Amer: >60 mL/min (ref >60)
Glucose, Bld: 112 mg/dL — ABNORMAL HIGH (ref 70–99)
Potassium: 4 mmol/L (ref 3.5–5.1)
Sodium: 138 mmol/L (ref 135–145)
Total Bilirubin: 0.4 mg/dL (ref 0.3–1.2)
Total Protein: 7 g/dL (ref 6.5–8.1)

## 2020-06-08 MED ORDER — OXYCODONE HCL 5 MG PO TABS: 5.0000 mg | ORAL_TABLET | ORAL | 0 refills | Status: DC | PRN

## 2020-06-08 MED ORDER — IOHEXOL 300 MG/ML  SOLN: 100.0000 mL | Freq: Once | INTRAMUSCULAR | Status: DC | PRN

## 2020-06-08 MED ORDER — MORPHINE SULFATE (PF) 4 MG/ML IV SOLN
4.0000 mg | Freq: Once | INTRAVENOUS | Status: AC
  Administered 2020-06-08: 4 mg via INTRAVENOUS
  Filled 2020-06-08: qty 1

## 2020-06-08 MED ORDER — SODIUM CHLORIDE 0.9 % IV BOLUS
1000.0000 mL | Freq: Once | INTRAVENOUS | Status: AC
  Administered 2020-06-08: 1000 mL via INTRAVENOUS

## 2020-06-08 MED ORDER — ONDANSETRON HCL 4 MG/2ML IJ SOLN
4.0000 mg | Freq: Once | INTRAMUSCULAR | Status: AC
  Administered 2020-06-08: 4 mg via INTRAVENOUS
  Filled 2020-06-08: qty 2

## 2020-06-08 MED ORDER — PROMETHAZINE HCL 25 MG PO TABS
25.0000 mg | ORAL_TABLET | Freq: Four times a day (QID) | ORAL | 0 refills | Status: DC | PRN
Start: 2020-06-08 — End: 2021-02-17

## 2020-06-08 MED ORDER — IOHEXOL 300 MG/ML  SOLN
100.0000 mL | Freq: Once | INTRAMUSCULAR | Status: AC | PRN
  Administered 2020-06-08: 100 mL via INTRAVENOUS

## 2020-06-08 MED ORDER — PROMETHAZINE HCL 25 MG/ML IJ SOLN
12.5000 mg | Freq: Once | INTRAMUSCULAR | Status: AC
  Administered 2020-06-08: 12.5 mg via INTRAVENOUS
  Filled 2020-06-08: qty 1

## 2020-06-08 NOTE — ED Triage Notes (Signed)
Pt reports upper abd pain and vomiting that started June 28.  Denies diarrhea.  Reports pain feels similar to the pain she had last year when she was admitted.  Pt says she was told last year that her "intestines were closing up."  Also says got 2 small ticks off of her abd this morning.

## 2020-06-08 NOTE — ED Provider Notes (Signed)
[facility] EMERGENCY DEPARTMENT Provider Note   CSN: [id number] Arrival date & time: 06/08/20  0730     History Chief Complaint  Patient presents with  . Abdominal Pain    [name] is a 54 y.o. female with a history as outlined below, most significant for GERD, hypertension, asthma, chronic constipation and a history of enteritis which required hospitalization 1 year ago presenting with similar symptoms which started 2 days ago.  She describes gradually worsening upper abdominal pain along with nausea and multiple episodes of vomiting.  She has had no diarrhea, also denies back pain, dysuria or hematuria, no fevers or chills.  She does endorse generalized weakness and has had anorexia for the past 2 days.  She has found no alleviators for her symptoms.  She states she spent majority of yesterday sleeping secondary to fatigue.  Primary gastroenterologist is Dr. Laural Golden  HPI     Past Medical History:  Diagnosis Date  . Arthritis   . Asthma    triggered by weather changes; no inhaler  . Cancer ([place])    Cervical  . Collagen vascular disease ([place])   . Fibromyalgia   . Ganglion cyst of wrist 12/2012   left dorsal cyst  . GERD (gastroesophageal reflux disease)    TUMS as needed  . Headache(784.0)    tension/migraines  . Hypercholesteremia   . Hypertension   . PONV (postoperative nausea and vomiting)    also states stopped breathing after hernia surgery  . Rectal bleeding   . Rheumatoid arthritis ([place])   . Stenosing tenosynovitis of finger 12/2012   left middle finger  . 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 Urine frequency 04/13/2020  . Non-intractable vomiting 10/08/2019  . CRP elevated 10/05/2019  . Noninfectious jejunitis 06/29/2019  . Anemia 06/29/2019  . Anemia due to vitamin B12 deficiency   . Nausea   . Gastroesophageal reflux disease 10/23/2018  . Upper abdominal pain 10/23/2018  . Chest  pain 10/16/2018  . Stiffness of joint, not elsewhere classified, ankle and foot 09/07/2014  . Pain in joint, ankle and foot 09/07/2014  . Difficulty in walking(719.7) 09/07/2014  . 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 Hematochezia 08/11/2013  . Constipation 08/11/2013  . RHEUMATOID ARTHRITIS 02/20/2010  . KNEE PAIN 02/20/2010  . FIBROMYALGIA 02/20/2010    Past Surgical History:  Procedure Laterality Date  . ABDOMINAL HYSTERECTOMY  1994  . APPENDECTOMY  1996  . BIOPSY  12/01/2018   Procedure: BIOPSY;  Surgeon: Rogene Houston, MD;  [facility];  Service: Endoscopy;;  gastric   . BIOPSY N/A 10/16/2019   Procedure: DUODENUM and JEJUNUM BIOPSY;  Surgeon: Rogene Houston, MD;  [facility];  Service: Endoscopy;  Laterality: N/A;  . COLONOSCOPY N/A 05/08/2013   Procedure: COLONOSCOPY;  Surgeon: Rogene Houston, MD;  [facility];  Service: Endoscopy;  Laterality: N/A;  240  . COLONOSCOPY N/A 09/23/2015   Procedure: COLONOSCOPY;  Surgeon: Rogene Houston, MD;  [facility];  Service: Endoscopy;  Laterality: N/A;  825  . CYSTOSCOPY WITH HYDRODISTENSION AND BIOPSY  04/01/2007  . [place]   left tube and ovary removed  . ENTEROSCOPY N/A 10/16/2019   Procedure: PUSH ENTEROSCOPY;  Surgeon: Rogene Houston, MD;  [facility];  Service: Endoscopy;  Laterality: N/A;  . ESOPHAGOGASTRODUODENOSCOPY N/A 12/01/2018   Procedure:  ESOPHAGOGASTRODUODENOSCOPY (EGD);  Surgeon: Rogene Houston, MD;  [facility];  Service: Endoscopy;  Laterality: N/A;  12:45  . ESOPHAGOGASTRODUODENOSCOPY (EGD) WITH PROPOFOL N/A 10/16/2019   Procedure: ESOPHAGOGASTRODUODENOSCOPY (EGD) WITH PROPOFOL;  Surgeon: Rogene Houston, MD;  [facility];  Service: Endoscopy;  Laterality: N/A;  1430pm  . GANGLION CYST EXCISION  12/17/2012   Procedure: REMOVAL GANGLION OF WRIST;  Surgeon: Wynonia Sours, MD;  Location: [place];  Service: Orthopedics;  Laterality: Left;  . HARDWARE REMOVAL Right 08/09/2014   Procedure: Placement Right Ankle Tight Rope, Removal Plate and Screws;  Surgeon: Marybelle Killings, MD;  Location: [place];  Service: Orthopedics;  Laterality: Right;  . PUBOVAGINAL SLING  2010  . rt ankle surgery    . SALPINGOOPHORECTOMY  1996   right  . TRIGGER FINGER RELEASE  12/17/2012   Procedure: RELEASE TRIGGER FINGER/A-1 PULLEY;  Surgeon: Wynonia Sours, MD;  Location: [place];  Service: Orthopedics;  Laterality: Left;  . TUBAL LIGATION  1992  . VAGINA RECONSTRUCTION SURGERY     x 5 since 2010  . VENTRAL HERNIA REPAIR  09/06/2006     OB History    Gravida  3   Para  2   Term  2   Preterm  0   AB  1   Living  2     SAB      TAB      Ectopic  1   Multiple      Live Births  2           Family History  Problem Relation Age of Onset  . Lung cancer Mother   . Cervical cancer Mother   . Heart disease Father   . Leukemia Father   . Cancer Maternal Grandmother        bladder and colon cancer    Social History   Tobacco Use  . Smoking status: Never Smoker  . Smokeless tobacco: Never Used  Vaping Use  . Vaping Use: Never used  Substance Use Topics  . Alcohol use: No  . Drug use: No    Home Medications Prior to Admission medications   Medication Sig Start Date End Date Taking? Authorizing Provider  acetaminophen (TYLENOL) 325 MG tablet Take 2 tablets (650 mg total) by mouth every 6 (six) hours as needed for mild pain, fever or headache. 06/23/19   Barton Dubois, MD  albuterol (VENTOLIN HFA) 108 (90 Base) MCG/ACT inhaler SMARTSIG:2 Spray(s) Via Inhaler Every 4 Hours PRN 12/22/19   [provider]  BIOTIN PO Take 5,000 mg by mouth daily.    [provider]  Cholecalciferol (VITAMIN D3) 125 MCG (5000 UT) CAPS Take 5,000 Units by mouth daily.     [provider]  cyanocobalamin (,VITAMIN B-12,) 1000 MCG/ML injection Inject 1 mL  intramuscularly monthly. 11/09/19   Rehman, Mechele Dawley, MD  dicyclomine (BENTYL) 10 MG capsule TAKE 1 CAPSULE (10 MG TOTAL) BY MOUTH 3 (THREE) TIMES DAILY BEFORE MEALS. 02/05/20   Rehman, Mechele Dawley, MD  estradiol (ESTRACE) 2 MG tablet Take 1 tablet (2 mg total) by mouth daily. 06/24/19   Florian Buff, MD  gabapentin (NEURONTIN) 400 MG capsule Take 1 capsule (400 mg total) by mouth at bedtime. 12/25/16   Setzer, Rona Ravens, NP  ibuprofen (ADVIL) 200 MG tablet Take 200 mg by mouth every 6 (six) hours as needed. Takes 3 tablets bid    [provider]  lisinopril (ZESTRIL) 5 MG tablet Take 1 tablet (5 mg total) by mouth at bedtime. 04/18/20   Herminio Commons, MD  mirabegron ER (MYRBETRIQ) 25 MG TB24 tablet Take 1 tablet (25 mg total) by mouth daily. 04/13/20   McKenzie, Candee Furbish, MD  Multiple Vitamins-Minerals (ONE-A-DAY WOMENS 50 PLUS PO) Take by mouth daily.    [provider]  ondansetron (ZOFRAN ODT) 8 MG disintegrating tablet Take 1 tablet (8 mg total) by mouth every 8 (eight) hours as needed for nausea or vomiting. 10/16/19   Rehman, Mechele Dawley, MD  oxyCODONE (ROXICODONE) 5 MG immediate release tablet Take 1 tablet (5 mg total) by mouth every 4 (four) hours as needed for severe pain. 06/08/20   Evalee Jefferson, PA-C  pantoprazole (PROTONIX) 40 MG tablet TAKE 1 TABLET (40 MG TOTAL) BY MOUTH DAILY. 02/22/20   Rogene Houston, MD  promethazine (PHENERGAN) 25 MG tablet Take 1 tablet (25 mg total) by mouth every 6 (six) hours as needed for nausea or vomiting. 06/08/20   Daleiza Bacchi, Almyra Free, PA-C    Allergies    Imitrex [sumatriptan], Cymbalta [duloxetine hcl], Latex, Propoxyphene n-acetaminophen, and Tramadol  Review of Systems   Review of Systems  Constitutional: Negative for chills and fever.  HENT: Negative for congestion and sore throat.   Eyes: Negative.   Respiratory: Negative for chest tightness and shortness of breath.   Cardiovascular: Negative for chest pain.  Gastrointestinal: Positive  for abdominal pain, nausea and vomiting.  Genitourinary: Negative.  Negative for dysuria.  Musculoskeletal: Negative for arthralgias, back pain, joint swelling and neck pain.  Skin: Negative.  Negative for rash and wound.  Neurological: Negative for dizziness, weakness, light-headedness, numbness and headaches.  Psychiatric/Behavioral: Negative.     Physical Exam Updated Vital Signs BP 111/81   Pulse 71   Temp 98.5 F (36.9 C) (Oral)   Resp 15   Ht 5\' 3"  (1.6 m)   Wt 80.7 kg   SpO2 99%   BMI 31.53 kg/m   Physical Exam Vitals and nursing note reviewed.  Constitutional:      Appearance: She is well-developed.  HENT:     Head: Normocephalic and atraumatic.  Eyes:     Conjunctiva/sclera: Conjunctivae normal.  Cardiovascular:     Rate and Rhythm: Normal rate and regular rhythm.     Heart sounds: Normal heart sounds.  Pulmonary:     Effort: Pulmonary effort is normal.     Breath sounds: Normal breath sounds. No wheezing.  Abdominal:     General: Bowel sounds are decreased.     Palpations: Abdomen is soft.     Tenderness: There is abdominal tenderness in the epigastric area. There is no guarding or rebound. Negative signs include Murphy's sign.     Comments: Soft distention present.  No increased tympany  Musculoskeletal:        General: Normal range of motion.     Cervical back: Normal range of motion.  Skin:    General: Skin is warm and dry.  Neurological:     Mental Status: She is alert.     ED Results / Procedures / Treatments   Labs (all labs ordered are listed, but only abnormal results are displayed) Labs Reviewed  CBC WITH DIFFERENTIAL/PLATELET - Abnormal; Notable for the following components:      Result Value   WBC 11.8 (*)    RBC 3.82 (*)    MCV 102.9 (*)    Neutro Abs 8.6 (*)  All other components within normal limits  COMPREHENSIVE METABOLIC PANEL - Abnormal; Notable for the following components:   Glucose, Bld 112 (*)    Calcium 8.8 (*)    All  other components within normal limits  URINALYSIS, ROUTINE W REFLEX MICROSCOPIC - Abnormal; Notable for the following components:   APPearance HAZY (*)    Hgb urine dipstick SMALL (*)    Bacteria, UA RARE (*)    All other components within normal limits    EKG None  Radiology CT ABDOMEN PELVIS W CONTRAST  Result Date: 06/08/2020 CLINICAL DATA:  Bowel obstruction suspected EXAM: CT ABDOMEN AND PELVIS WITH CONTRAST TECHNIQUE: Multidetector CT imaging of the abdomen and pelvis was performed using the standard protocol following bolus administration of intravenous contrast. CONTRAST:  159mL OMNIPAQUE IOHEXOL 300 MG/ML  SOLN COMPARISON:  03/14/2020 FINDINGS: Lower chest: No acute abnormality. Hepatobiliary: No focal liver abnormality is seen. Status post cholecystectomy. Postoperative biliary dilatation. Pancreas: Unremarkable. No pancreatic ductal dilatation or surrounding inflammatory changes. Spleen: Normal in size without significant abnormality. Adrenals/Urinary Tract: Adrenal glands are unremarkable. Kidneys are normal, without renal calculi, solid lesion, or hydronephrosis. Large calculi near the bladder outlet and adjacent to the urethra, unchanged compared to prior examination and measuring 1.6 cm at the bladder outlet (series 6, image 62). Stomach/Bowel: Stomach is within normal limits. Appendix is surgically absent. No evidence of bowel wall thickening, distention, or inflammatory changes. Vascular/Lymphatic: No significant vascular findings are present. No enlarged abdominal or pelvic lymph nodes. Reproductive: Status post hysterectomy. Other: No abdominal wall hernia or abnormality. No abdominopelvic ascites. Musculoskeletal: No acute or significant osseous findings. IMPRESSION: 1. No evidence of bowel obstruction or other bowel pathology. 2. Large calculi near the bladder outlet and adjacent to the urethra, unchanged compared to prior examination and measuring 1.6 cm at the bladder outlet. 3.  Status post cholecystectomy, appendectomy, and hysterectomy. Electronically Signed   By: Eddie Candle M.D.   On: 06/08/2020 14:17    Procedures Procedures (including critical care time)  Medications Ordered in ED Medications  iohexol (OMNIPAQUE) 300 MG/ML solution 100 mL (has no administration in time range)  sodium chloride 0.9 % bolus 1,000 mL (0 mLs Intravenous Stopped 06/08/20 1338)  morphine 4 MG/ML injection 4 mg (4 mg Intravenous Given 06/08/20 1223)  ondansetron (ZOFRAN) injection 4 mg (4 mg Intravenous Given 06/08/20 1223)  iohexol (OMNIPAQUE) 300 MG/ML solution 100 mL (100 mLs Intravenous Contrast Given 06/08/20 1328)  promethazine (PHENERGAN) injection 12.5 mg (12.5 mg Intravenous Given 06/08/20 1431)    ED Course  I have reviewed the triage vital signs and the nursing notes.  Pertinent labs & imaging results that were available during my care of the patient were reviewed by me and considered in my medical decision making (see chart for details).    MDM Rules/Calculators/A&P                          3:05 PM Pt recheck, pain free but with persistent nausea, phenergan added.   Pt with upper abd pain, n/v since yesterday, labs and CT imaging reviewed and discussed with pt and husband at bedside.  No acute findings.  Bladder stone chronic, under the care of Dr. McDiarmid, no lower abd pain or urinary complaints.  Suspect pt's sx may be functional/colic.  She does have bentyl prescribed by Dr. Laural Golden but not currently taking.  Advised may add this.  Prescribed phenergan/ few oxycodone tablets with caution re sedation.  Bland diet, prn f/u with Dr. Laural Golden as needed.  No acute abd findings, labs and CT reassuring.    The patient appears reasonably screened and/or stabilized for discharge and I doubt any other medical condition or other [facility] requiring further screening, evaluation, or treatment in the ED at this time prior to discharge.   Final Clinical Impression(s) / ED  Diagnoses Final diagnoses:  Pain of upper abdomen    Rx / DC Orders ED Discharge Orders         Ordered    promethazine (PHENERGAN) 25 MG tablet  Every 6 hours PRN     Discontinue  Reprint     06/08/20 1435    oxyCODONE (ROXICODONE) 5 MG immediate release tablet  Every 4 hours PRN,   Status:  Discontinued     Reprint     06/08/20 1436    oxyCODONE (ROXICODONE) 5 MG immediate release tablet  Every 4 hours PRN     Discontinue  Reprint     06/08/20 1437           Evalee Jefferson, PA-C 06/08/20 1505    Elnora Morrison, MD 06/08/20 1624

## 2020-06-08 NOTE — ED Notes (Signed)
Patient transported to CT 

## 2020-06-08 NOTE — Discharge Instructions (Addendum)
Your CT scan today is normal for any findings which would explain your abdominal pain.  You do not have any intestinal inflammation or blockages.   You have been prescribed nausea and pain medicine - I recommend a bland diet pushing plenty of fluids for the next day.  Get rechecked if your symptoms are not improving with this plan.  You may take the oxycodone prescribed for pain relief.  This will make you drowsy - do not drive within 4 hours of taking this medication. c

## 2020-06-09 ENCOUNTER — Encounter (INDEPENDENT_AMBULATORY_CARE_PROVIDER_SITE_OTHER): Payer: Self-pay

## 2020-06-18 ENCOUNTER — Other Ambulatory Visit: Payer: Self-pay | Admitting: Obstetrics & Gynecology

## 2020-07-11 ENCOUNTER — Other Ambulatory Visit (INDEPENDENT_AMBULATORY_CARE_PROVIDER_SITE_OTHER): Payer: Self-pay | Admitting: *Deleted

## 2020-07-11 DIAGNOSIS — R7982 Elevated C-reactive protein (CRP): Secondary | ICD-10-CM

## 2020-07-11 DIAGNOSIS — R531 Weakness: Secondary | ICD-10-CM

## 2020-07-11 DIAGNOSIS — R1013 Epigastric pain: Secondary | ICD-10-CM

## 2020-07-12 LAB — C-REACTIVE PROTEIN: CRP: 16.9 mg/L — ABNORMAL HIGH (ref <8.0)

## 2020-07-12 LAB — CBC WITH DIFFERENTIAL/PLATELET
Absolute Monocytes: 578 cells/uL (ref 200–950)
Basophils Absolute: 39 cells/uL (ref 0–200)
Basophils Relative: 0.5 %
Eosinophils Absolute: 154 cells/uL (ref 15–500)
Eosinophils Relative: 2 %
HCT: 33 % — ABNORMAL LOW (ref 35.0–45.0)
Hemoglobin: 11 g/dL — ABNORMAL LOW (ref 11.7–15.5)
Lymphs Abs: 3303 cells/uL (ref 850–3900)
MCH: 33 pg (ref 27.0–33.0)
MCHC: 33.3 g/dL (ref 32.0–36.0)
MCV: 99.1 fL (ref 80.0–100.0)
MPV: 9.7 fL (ref 7.5–12.5)
Monocytes Relative: 7.5 %
Neutro Abs: 3627 cells/uL (ref 1500–7800)
Neutrophils Relative %: 47.1 %
Platelets: 196 10*3/uL (ref 140–400)
RBC: 3.33 10*6/uL — ABNORMAL LOW (ref 3.80–5.10)
RDW: 12.6 % (ref 11.0–15.0)
Total Lymphocyte: 42.9 %
WBC: 7.7 10*3/uL (ref 3.8–10.8)

## 2020-07-13 ENCOUNTER — Other Ambulatory Visit: Payer: Self-pay

## 2020-07-13 ENCOUNTER — Ambulatory Visit (INDEPENDENT_AMBULATORY_CARE_PROVIDER_SITE_OTHER): Payer: BC Managed Care – PPO | Admitting: Gastroenterology

## 2020-07-13 ENCOUNTER — Encounter (INDEPENDENT_AMBULATORY_CARE_PROVIDER_SITE_OTHER): Payer: Self-pay | Admitting: Gastroenterology

## 2020-07-13 VITALS — BP 117/75 | HR 72 | Temp 98.4°F | Ht 63.0 in | Wt 174.5 lb

## 2020-07-13 DIAGNOSIS — Z86010 Personal history of colonic polyps: Secondary | ICD-10-CM

## 2020-07-13 DIAGNOSIS — D649 Anemia, unspecified: Secondary | ICD-10-CM

## 2020-07-13 MED ORDER — SUCRALFATE 1 G PO TABS
1.0000 g | ORAL_TABLET | Freq: Two times a day (BID) | ORAL | 3 refills | Status: DC
Start: 2020-07-13 — End: 2020-09-14

## 2020-07-13 NOTE — Progress Notes (Signed)
Patient profile: [name] is a 54 y.o. female seen for evaluation of abdominal pain, nausea.   History of Present Illness: [name] is seen today for ER follow up - she was seen in the emergency room in June 2021 for exacerbation of chronic symptoms. She has kept a symptom journal since her last visit and reports having episodes the following dates-4/16, 4/29, 5/26, 6/5. 6/25, 6/28, 6/30, 06/29/20   She describes episodes as developing upper abdominal pain that is sharp in mid upper abdomen associated with nausea.  She takes Bentyl 3 times a day and has the upper abdominal pain despite taking the Bentyl.  Between episodes she does not have sharp pain but does have a constant nausea.  She is not vomiting.  She has frequent heartburn indigestion. She has had a lot of stress recently.   She reports a history of chronic constipation, last bowel movement was a week ago.  Reports in the past she has had up to a month between bowel movements.  She denies any dark stools or black stools.  No significant lower abdominal pain. Does not feel the nausea is worse when having the upper abdominal pain. She reports trying Linzess 145 mcg in the past which caused diarrhea. Currently taking 1-2 stool softener a day. Reports very rarely using narcotics a few times a year.      Wt Readings from Last 3 Encounters:  07/13/20 174 lb 8 oz (79.2 kg)  06/08/20 178 lb (80.7 kg)  04/13/20 175 lb (79.4 kg)     Last Colonoscopy: 09/2015-Findings:   Prep excellent. Normal mucosa of terminal ileum. Single small diverticulum noted at proximal transverse colon otherwise mucosa of cecum ascending colon hepatic flexure, transverse colon and splenic flexure as well as descending and sigmoid colon was normal. Normal rectal mucosa. Small hemorrhoids below the dentate line along with focal erythema.    Last Endoscopy: 10/2015- - Normal esophagus. - Z-line irregular, 36 cm from the incisors. - 2 cm hiatal  hernia.  - Gastritis. Biopsied.  - Normal duodenal bulb, second portion of the duodenum and third portion of the duodenum.  - Congested duodenal mucosa. Biopsied.  - Congested (edematous) jejunum. Biopsied   Past Medical History:  Past Medical History:  Diagnosis Date  . Arthritis   . Asthma    triggered by weather changes; no inhaler  . Cancer ([place])    Cervical  . Collagen vascular disease ([place])   . Fibromyalgia   . Ganglion cyst of wrist 12/2012   left dorsal cyst  . GERD (gastroesophageal reflux disease)    TUMS as needed  . Headache(784.0)    tension/migraines  . Hypercholesteremia   . Hypertension   . PONV (postoperative nausea and vomiting)    also states stopped breathing after hernia surgery  . Rectal bleeding   . Rheumatoid arthritis ([place])   . Stenosing tenosynovitis of finger 12/2012   left middle finger  . 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 [facility])     Problem List: Patient Active Problem List   Diagnosis Date Noted  . Urine frequency 04/13/2020  . Non-intractable vomiting 10/08/2019  . CRP elevated 10/05/2019  . Noninfectious jejunitis 06/29/2019  . Anemia 06/29/2019  . Anemia due to vitamin B12 deficiency   . Nausea   . Gastroesophageal reflux disease 10/23/2018  . Upper abdominal pain 10/23/2018  . Chest pain 10/16/2018  . Stiffness of joint, not elsewhere classified, ankle and foot 09/07/2014  . Pain in joint,  ankle and foot 09/07/2014  . Difficulty in walking(719.7) 09/07/2014  . 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 Hematochezia 08/11/2013  . Constipation 08/11/2013  . RHEUMATOID ARTHRITIS 02/20/2010  . KNEE PAIN 02/20/2010  . FIBROMYALGIA 02/20/2010    Past Surgical History: Past Surgical History:  Procedure Laterality Date  . ABDOMINAL HYSTERECTOMY  1994  . APPENDECTOMY  1996  . BIOPSY  12/01/2018   Procedure: BIOPSY;  Surgeon: Rogene Houston, MD;  [facility];  Service: Endoscopy;;  gastric   . BIOPSY N/A 10/16/2019    Procedure: DUODENUM and JEJUNUM BIOPSY;  Surgeon: Rogene Houston, MD;  [facility];  Service: Endoscopy;  Laterality: N/A;  . COLONOSCOPY N/A 05/08/2013   Procedure: COLONOSCOPY;  Surgeon: Rogene Houston, MD;  [facility];  Service: Endoscopy;  Laterality: N/A;  240  . COLONOSCOPY N/A 09/23/2015   Procedure: COLONOSCOPY;  Surgeon: Rogene Houston, MD;  [facility];  Service: Endoscopy;  Laterality: N/A;  825  . CYSTOSCOPY WITH HYDRODISTENSION AND BIOPSY  04/01/2007  . [place]   left tube and ovary removed  . ENTEROSCOPY N/A 10/16/2019   Procedure: PUSH ENTEROSCOPY;  Surgeon: Rogene Houston, MD;  [facility];  Service: Endoscopy;  Laterality: N/A;  . ESOPHAGOGASTRODUODENOSCOPY N/A 12/01/2018   Procedure: ESOPHAGOGASTRODUODENOSCOPY (EGD);  Surgeon: Rogene Houston, MD;  [facility];  Service: Endoscopy;  Laterality: N/A;  12:45  . ESOPHAGOGASTRODUODENOSCOPY (EGD) WITH PROPOFOL N/A 10/16/2019   Procedure: ESOPHAGOGASTRODUODENOSCOPY (EGD) WITH PROPOFOL;  Surgeon: Rogene Houston, MD;  [facility];  Service: Endoscopy;  Laterality: N/A;  1430pm  . GANGLION CYST EXCISION  12/17/2012   Procedure: REMOVAL GANGLION OF WRIST;  Surgeon: Wynonia Sours, MD;  Location: [place];  Service: Orthopedics;  Laterality: Left;  . HARDWARE REMOVAL Right 08/09/2014   Procedure: Placement Right Ankle Tight Rope, Removal Plate and Screws;  Surgeon: Marybelle Killings, MD;  Location: [place];  Service: Orthopedics;  Laterality: Right;  . PUBOVAGINAL SLING  2010  . rt ankle surgery    . SALPINGOOPHORECTOMY  1996   right  . TRIGGER FINGER RELEASE  12/17/2012   Procedure: RELEASE TRIGGER FINGER/A-1 PULLEY;  Surgeon: Wynonia Sours, MD;  Location: [place];  Service: Orthopedics;  Laterality: Left;  . TUBAL LIGATION  1992  . VAGINA RECONSTRUCTION SURGERY     x 5 since 2010  . VENTRAL HERNIA REPAIR   09/06/2006    Allergies: Allergies  Allergen Reactions  . Imitrex [Sumatriptan]     Causes a migraine  . Cymbalta [Duloxetine Hcl] Rash  . Latex Rash  . Propoxyphene N-Acetaminophen Nausea And Vomiting and Rash  . Tramadol Rash      Home Medications:  Current Outpatient Medications:  .  acetaminophen (TYLENOL) 325 MG tablet, Take 2 tablets (650 mg total) by mouth every 6 (six) hours as needed for mild pain, fever or headache., Disp: 30 tablet, Rfl: 0 .  albuterol (VENTOLIN HFA) 108 (90 Base) MCG/ACT inhaler, SMARTSIG:2 Spray(s) Via Inhaler Every 4 Hours PRN, Disp: , Rfl:  .  BIOTIN PO, Take 5,000 mg by mouth daily., Disp: , Rfl:  .  Cholecalciferol (VITAMIN D3) 125 MCG (5000 UT) CAPS, Take 5,000 Units by mouth daily. , Disp: , Rfl:  .  cyanocobalamin (,VITAMIN B-12,) 1000 MCG/ML injection, Inject 1 mL intramuscularly monthly., Disp: 15 mL, Rfl: 1 .  dicyclomine (BENTYL) 10 MG  capsule, TAKE 1 CAPSULE (10 MG TOTAL) BY MOUTH 3 (THREE) TIMES DAILY BEFORE MEALS., Disp: 270 capsule, Rfl: 5 .  estradiol (ESTRACE) 2 MG tablet, TAKE 1 TABLET BY MOUTH EVERY DAY, Disp: 90 tablet, Rfl: 3 .  gabapentin (NEURONTIN) 400 MG capsule, Take 1 capsule (400 mg total) by mouth at bedtime., Disp: 90 capsule, Rfl: 0 .  ibuprofen (ADVIL) 200 MG tablet, Take 200 mg by mouth every 6 (six) hours as needed. Takes 3 tablets bid, Disp: , Rfl:  .  lisinopril (ZESTRIL) 5 MG tablet, Take 1 tablet (5 mg total) by mouth at bedtime., Disp: 90 tablet, Rfl: 3 .  Multiple Vitamins-Minerals (ONE-A-DAY WOMENS 50 PLUS PO), Take by mouth daily., Disp: , Rfl:  .  ondansetron (ZOFRAN ODT) 8 MG disintegrating tablet, Take 1 tablet (8 mg total) by mouth every 8 (eight) hours as needed for nausea or vomiting., Disp: 30 tablet, Rfl: 1 .  pantoprazole (PROTONIX) 40 MG tablet, TAKE 1 TABLET (40 MG TOTAL) BY MOUTH DAILY., Disp: 90 tablet, Rfl: 1 .  promethazine (PHENERGAN) 25 MG tablet, Take 1 tablet (25 mg total) by mouth every 6 (six)  hours as needed for nausea or vomiting., Disp: 20 tablet, Rfl: 0 .  mirabegron ER (MYRBETRIQ) 25 MG TB24 tablet, Take 1 tablet (25 mg total) by mouth daily. (Patient not taking: Reported on 07/13/2020), Disp: 30 tablet, Rfl: 0 .  oxyCODONE (ROXICODONE) 5 MG immediate release tablet, Take 1 tablet (5 mg total) by mouth every 4 (four) hours as needed for severe pain. (Patient not taking: Reported on 07/13/2020), Disp: 10 tablet, Rfl: 0 .  sucralfate (CARAFATE) 1 g tablet, Take 1 tablet (1 g total) by mouth 2 (two) times daily before a meal., Disp: 60 tablet, Rfl: 3   Family History: family history includes Cancer in her maternal grandmother; Cervical cancer in her mother; Heart disease in her father; Leukemia in her father; Lung cancer in her mother.    Social History:   reports that she has never smoked. She has never used smokeless tobacco. She reports that she does not drink alcohol and does not use drugs.   Review of Systems: Constitutional: Denies weight loss/weight gain  Eyes: No changes in vision. ENT: No oral lesions, sore throat.  GI: see HPI.  Heme/Lymph: No easy bruising.  CV: No chest pain.  GU: No hematuria.  Integumentary: No rashes.  Neuro: No headaches.  Psych: No depression/anxiety.  Endocrine: No heat/cold intolerance.  Allergic/Immunologic: No urticaria.  Resp: No cough, SOB.  Musculoskeletal: No joint swelling.    Physical Examination: BP 117/75 (BP Location: Right Arm, Patient Position: Sitting, Cuff Size: Normal)   Pulse 72   Temp 98.4 F (36.9 C) (Oral)   Ht 5\' 3"  (1.6 m)   Wt 174 lb 8 oz (79.2 kg)   BMI 30.91 kg/m  Gen: NAD, alert and oriented x 4 HEENT: PEERLA, EOMI, Neck: supple, no JVD Chest: CTA bilaterally, no wheezes, crackles, or other adventitious sounds CV: RRR, no m/g/c/r Abd: soft, TTP epigastric area, ND, +BS in all four quadrants; no HSM, guarding, ridigity, or rebound tenderness Ext: no edema, well perfused with 2+ pulses, Skin: no rash or  lesions noted on observed skin Lymph: no noted LAD  Data Reviewed:  07/11/20-BNP 16.9, hemoglobin 11 MCV 99  05/2020-CMP normal except glucose 112, and calcium 8.8  03/2020--B12 normal, TSH normal folate and copper normal   Assessment/Plan: Ms. Staiger is a 54 y.o. female    Gwyneth was seen  today for follow-up.  Diagnoses and all orders for this visit:  Anemia, unspecified type -     Fe+TIBC+Fer  Personal history of colonic polyps  Other orders -     sucralfate (CARAFATE) 1 g tablet; Take 1 tablet (1 g total) by mouth 2 (two) times daily before a meal.    1. Epigastric pain-he has had an evaluation with a CT scan, endoscopy, labs. She has had her gallbladder removed in the past.  She does on further questioning take a heavy amount of ibuprofen, took 600 mg this morning on empty stomach.  She is on Protonix 40 mg once a day but will try adding Carafate, reviewed NSAID avoidance given her hx of gastritis on EGD last year. She has not been able to find any food triggers symptoms.  2.  Chronic constipation-severe, taking stool softener twice a day.  We will try her on low-dose Linzess, she reports history of diarrhea 145 mcg dose and will try 44mcg.  She was given samples and will let me know if she would like this sent to her pharmacy.  3.  History of colon polyps-due for colonoscopy in October 2021 and will arrange this today.  She denies any history of issues with sedation except post op. Did review need to improve her chronic constipation prior to colonoscopy prep to decrease risk of inadequate prep.  4.  Anemia unspecified-baseline hemoglobin around 12, hemoglobin yesterday was 11. We will check an iron level although MCV normal so may be anemia of chronic disease.  B12 was normal in April.   Patient denies CP, SOB, and use of blood thinners. I discussed the risks and benefits of procedure including bleeding, perforation, infection, missed lesions, medication reactions and possible  hospitalization or surgery if complications. All questions answered.   I personally performed the service, non-incident to. (WP)  Laurine Blazer, [facility]

## 2020-07-13 NOTE — Patient Instructions (Signed)
We are trying linzess 72 mcg - take 30 min before breakfast.  Let me know if constipation does not improve  We are scheduling colonoscopy for evaluation We are checking iron lab

## 2020-07-14 LAB — IRON,TIBC AND FERRITIN PANEL
%SAT: 13 % (calc) — ABNORMAL LOW (ref 16–45)
Ferritin: 49 ng/mL (ref 16–232)
Iron: 41 ug/dL — ABNORMAL LOW (ref 45–160)
TIBC: 307 mcg/dL (calc) (ref 250–450)

## 2020-07-22 ENCOUNTER — Other Ambulatory Visit (INDEPENDENT_AMBULATORY_CARE_PROVIDER_SITE_OTHER): Payer: Self-pay | Admitting: Internal Medicine

## 2020-07-26 ENCOUNTER — Other Ambulatory Visit (INDEPENDENT_AMBULATORY_CARE_PROVIDER_SITE_OTHER): Payer: Self-pay | Admitting: *Deleted

## 2020-07-27 ENCOUNTER — Encounter (INDEPENDENT_AMBULATORY_CARE_PROVIDER_SITE_OTHER): Payer: Self-pay

## 2020-08-04 ENCOUNTER — Encounter (INDEPENDENT_AMBULATORY_CARE_PROVIDER_SITE_OTHER): Payer: Self-pay

## 2020-08-17 ENCOUNTER — Encounter (INDEPENDENT_AMBULATORY_CARE_PROVIDER_SITE_OTHER): Payer: Self-pay | Admitting: *Deleted

## 2020-08-17 ENCOUNTER — Telehealth (INDEPENDENT_AMBULATORY_CARE_PROVIDER_SITE_OTHER): Payer: Self-pay | Admitting: *Deleted

## 2020-08-17 MED ORDER — SUTAB 1479-225-188 MG PO TABS
1.0000 | ORAL_TABLET | Freq: Once | ORAL | 0 refills | Status: AC
Start: 2020-08-17 — End: 2020-08-17

## 2020-08-17 NOTE — Telephone Encounter (Signed)
Patient needs Sutab (copay card) ° °

## 2020-09-08 ENCOUNTER — Encounter (INDEPENDENT_AMBULATORY_CARE_PROVIDER_SITE_OTHER): Payer: Self-pay | Admitting: *Deleted

## 2020-09-08 NOTE — Patient Instructions (Signed)
Your procedure is scheduled on: 09/14/2020  Report to [facility] at  6:15   AM.  Call this number if you have problems the morning of surgery: [phone]   Remember:              Follow Directions on the letter you received from Your Physician's office regarding the Bowel Prep              No Smoking the day of Procedure :   Take these medicines the morning of surgery with A SIP OF WATER: Pantoprazole  Oxycodone and/or phenergan if needed   Do not wear jewelry, make-up or nail polish.    Do not bring valuables to the hospital.  Contacts, dentures or bridgework may not be worn into surgery.  .   Patients discharged the day of surgery will not be allowed to drive home.     Colonoscopy, Adult, Care After This sheet gives you information about how to care for yourself after your procedure. Your health care provider may also give you more specific instructions. If you have problems or questions, contact your health care provider. What can I expect after the procedure? After the procedure, it is common to have:  A small amount of blood in your stool for 24 hours after the procedure.  Some gas.  Mild abdominal cramping or bloating.  Follow these instructions at home: General instructions   For the first 24 hours after the procedure: ? Do not drive or use machinery. ? Do not sign important documents. ? Do not drink alcohol. ? Do your regular daily activities at a slower pace than normal. ? Eat soft, easy-to-digest foods. ? Rest often.  Take over-the-counter or prescription medicines only as told by your health care provider.  It is up to you to get the results of your procedure. Ask your health care provider, or the department performing the procedure, when your results will be ready. Relieving cramping and bloating  Try walking around when you have cramps or feel bloated.  Apply heat to your abdomen as told by your health care provider. Use a heat source that your  health care provider recommends, such as a moist heat pack or a heating pad. ? Place a towel between your skin and the heat source. ? Leave the heat on for 20-30 minutes. ? Remove the heat if your skin turns bright red. This is especially important if you are unable to feel pain, heat, or cold. You may have a greater risk of getting burned. Eating and drinking  Drink enough fluid to keep your urine clear or pale yellow.  Resume your normal diet as instructed by your health care provider. Avoid heavy or fried foods that are hard to digest.  Avoid drinking alcohol for as long as instructed by your health care provider. Contact a health care provider if:  You have blood in your stool 2-3 days after the procedure. Get help right away if:  You have more than a small spotting of blood in your stool.  You pass large blood clots in your stool.  Your abdomen is swollen.  You have nausea or vomiting.  You have a fever.  You have increasing abdominal pain that is not relieved with medicine. This information is not intended to replace advice given to you by your health care provider. Make sure you discuss any questions you have with your health care provider. Document Released: 07/10/2004 Document Revised: 08/20/2016 Document Reviewed: 02/07/2016 Elsevier Interactive Patient  Education  2018 [employer].

## 2020-09-12 ENCOUNTER — Encounter (HOSPITAL_COMMUNITY): Payer: Self-pay

## 2020-09-12 ENCOUNTER — Encounter (HOSPITAL_COMMUNITY)
Admission: RE | Admit: 2020-09-12 | Discharge: 2020-09-12 | Disposition: A | Discharge status: Home / Self Care | Payer: BC Managed Care – PPO | Source: Ambulatory Visit | Attending: Internal Medicine | Admitting: Internal Medicine

## 2020-09-12 ENCOUNTER — Other Ambulatory Visit (HOSPITAL_COMMUNITY)
Admission: RE | Admit: 2020-09-12 | Discharge: 2020-09-12 | Disposition: A | Discharge status: Home / Self Care | Payer: BC Managed Care – PPO | Source: Ambulatory Visit | Attending: Internal Medicine | Admitting: Internal Medicine

## 2020-09-12 ENCOUNTER — Other Ambulatory Visit: Payer: Self-pay

## 2020-09-12 DIAGNOSIS — Z01810 Encounter for preprocedural cardiovascular examination: Secondary | ICD-10-CM | POA: Insufficient documentation

## 2020-09-12 DIAGNOSIS — Z20822 Contact with and (suspected) exposure to covid-19: Secondary | ICD-10-CM | POA: Insufficient documentation

## 2020-09-12 DIAGNOSIS — I10 Essential (primary) hypertension: Secondary | ICD-10-CM | POA: Insufficient documentation

## 2020-09-12 DIAGNOSIS — Z01818 Encounter for other preprocedural examination: Secondary | ICD-10-CM | POA: Insufficient documentation

## 2020-09-12 LAB — SARS CORONAVIRUS 2 (TAT 6-24 HRS): SARS Coronavirus 2: NEGATIVE

## 2020-09-14 ENCOUNTER — Encounter (HOSPITAL_COMMUNITY): Payer: Self-pay | Admitting: Internal Medicine

## 2020-09-14 ENCOUNTER — Encounter (HOSPITAL_COMMUNITY)
Admission: RE | Disposition: A | Discharge status: Home / Self Care | Payer: Self-pay | Source: Home / Self Care | Attending: Internal Medicine

## 2020-09-14 ENCOUNTER — Ambulatory Visit (HOSPITAL_COMMUNITY): Payer: BC Managed Care – PPO | Admitting: Certified Registered"

## 2020-09-14 ENCOUNTER — Ambulatory Visit (HOSPITAL_COMMUNITY)
Admission: RE | Admit: 2020-09-14 | Discharge: 2020-09-14 | Disposition: A | Discharge status: Home / Self Care | Payer: BC Managed Care – PPO | Attending: Internal Medicine | Admitting: Internal Medicine

## 2020-09-14 ENCOUNTER — Encounter (INDEPENDENT_AMBULATORY_CARE_PROVIDER_SITE_OTHER): Payer: Self-pay

## 2020-09-14 DIAGNOSIS — Z8249 Family history of ischemic heart disease and other diseases of the circulatory system: Secondary | ICD-10-CM | POA: Diagnosis not present

## 2020-09-14 DIAGNOSIS — Z806 Family history of leukemia: Secondary | ICD-10-CM | POA: Insufficient documentation

## 2020-09-14 DIAGNOSIS — D125 Benign neoplasm of sigmoid colon: Secondary | ICD-10-CM | POA: Diagnosis not present

## 2020-09-14 DIAGNOSIS — M797 Fibromyalgia: Secondary | ICD-10-CM | POA: Insufficient documentation

## 2020-09-14 DIAGNOSIS — Z801 Family history of malignant neoplasm of trachea, bronchus and lung: Secondary | ICD-10-CM | POA: Diagnosis not present

## 2020-09-14 DIAGNOSIS — D123 Benign neoplasm of transverse colon: Secondary | ICD-10-CM | POA: Insufficient documentation

## 2020-09-14 DIAGNOSIS — D649 Anemia, unspecified: Secondary | ICD-10-CM | POA: Diagnosis not present

## 2020-09-14 DIAGNOSIS — Z8052 Family history of malignant neoplasm of bladder: Secondary | ICD-10-CM | POA: Diagnosis not present

## 2020-09-14 DIAGNOSIS — I10 Essential (primary) hypertension: Secondary | ICD-10-CM | POA: Diagnosis not present

## 2020-09-14 DIAGNOSIS — Z791 Long term (current) use of non-steroidal anti-inflammatories (NSAID): Secondary | ICD-10-CM | POA: Diagnosis not present

## 2020-09-14 DIAGNOSIS — Z8541 Personal history of malignant neoplasm of cervix uteri: Secondary | ICD-10-CM | POA: Diagnosis not present

## 2020-09-14 DIAGNOSIS — E7800 Pure hypercholesterolemia, unspecified: Secondary | ICD-10-CM | POA: Diagnosis not present

## 2020-09-14 DIAGNOSIS — Z885 Allergy status to narcotic agent status: Secondary | ICD-10-CM | POA: Insufficient documentation

## 2020-09-14 DIAGNOSIS — Z86010 Personal history of colonic polyps: Secondary | ICD-10-CM | POA: Insufficient documentation

## 2020-09-14 DIAGNOSIS — Z800 Family history of malignant neoplasm of digestive organs: Secondary | ICD-10-CM | POA: Insufficient documentation

## 2020-09-14 DIAGNOSIS — Z90721 Acquired absence of ovaries, unilateral: Secondary | ICD-10-CM | POA: Insufficient documentation

## 2020-09-14 DIAGNOSIS — M069 Rheumatoid arthritis, unspecified: Secondary | ICD-10-CM | POA: Diagnosis not present

## 2020-09-14 DIAGNOSIS — Z90710 Acquired absence of both cervix and uterus: Secondary | ICD-10-CM | POA: Insufficient documentation

## 2020-09-14 DIAGNOSIS — J45909 Unspecified asthma, uncomplicated: Secondary | ICD-10-CM | POA: Diagnosis not present

## 2020-09-14 DIAGNOSIS — K219 Gastro-esophageal reflux disease without esophagitis: Secondary | ICD-10-CM | POA: Insufficient documentation

## 2020-09-14 DIAGNOSIS — K5730 Diverticulosis of large intestine without perforation or abscess without bleeding: Secondary | ICD-10-CM | POA: Insufficient documentation

## 2020-09-14 DIAGNOSIS — R519 Headache, unspecified: Secondary | ICD-10-CM | POA: Insufficient documentation

## 2020-09-14 DIAGNOSIS — Z1211 Encounter for screening for malignant neoplasm of colon: Secondary | ICD-10-CM | POA: Insufficient documentation

## 2020-09-14 DIAGNOSIS — M359 Systemic involvement of connective tissue, unspecified: Secondary | ICD-10-CM | POA: Diagnosis not present

## 2020-09-14 DIAGNOSIS — Z09 Encounter for follow-up examination after completed treatment for conditions other than malignant neoplasm: Secondary | ICD-10-CM | POA: Diagnosis not present

## 2020-09-14 DIAGNOSIS — Z79899 Other long term (current) drug therapy: Secondary | ICD-10-CM | POA: Diagnosis not present

## 2020-09-14 DIAGNOSIS — K635 Polyp of colon: Secondary | ICD-10-CM | POA: Diagnosis not present

## 2020-09-14 HISTORY — PX: COLONOSCOPY WITH PROPOFOL: SHX5780

## 2020-09-14 HISTORY — PX: POLYPECTOMY: SHX5525

## 2020-09-14 SURGERY — COLONOSCOPY WITH PROPOFOL
Anesthesia: General

## 2020-09-14 MED ORDER — LIDOCAINE 2% (20 MG/ML) 5 ML SYRINGE
INTRAMUSCULAR | Status: AC
  Filled 2020-09-14: qty 5

## 2020-09-14 MED ORDER — LIDOCAINE HCL (CARDIAC) PF 100 MG/5ML IV SOSY
PREFILLED_SYRINGE | INTRAVENOUS | Status: DC | PRN
  Administered 2020-09-14: 50 mg via INTRAVENOUS

## 2020-09-14 MED ORDER — LACTATED RINGERS IV SOLN
INTRAVENOUS | Status: DC
  Administered 2020-09-14: 1000 mL via INTRAVENOUS

## 2020-09-14 MED ORDER — LACTATED RINGERS IV SOLN: INTRAVENOUS | Status: DC | PRN

## 2020-09-14 MED ORDER — EPHEDRINE SULFATE-NACL 50-0.9 MG/10ML-% IV SOSY
PREFILLED_SYRINGE | INTRAVENOUS | Status: DC | PRN
  Administered 2020-09-14: 5 mg via INTRAVENOUS

## 2020-09-14 MED ORDER — ONDANSETRON HCL 4 MG/2ML IJ SOLN
INTRAMUSCULAR | Status: DC | PRN
  Administered 2020-09-14: 4 mg via INTRAVENOUS

## 2020-09-14 MED ORDER — EPHEDRINE 5 MG/ML INJ
INTRAVENOUS | Status: AC
  Filled 2020-09-14: qty 10

## 2020-09-14 MED ORDER — PROPOFOL 10 MG/ML IV BOLUS
INTRAVENOUS | Status: DC | PRN
  Administered 2020-09-14: 50 mg via INTRAVENOUS
  Administered 2020-09-14: 100 mg via INTRAVENOUS
  Administered 2020-09-14: 50 mg via INTRAVENOUS

## 2020-09-14 MED ORDER — STERILE WATER FOR IRRIGATION IR SOLN
Status: DC | PRN
  Administered 2020-09-14: 100 mL

## 2020-09-14 MED ORDER — PROPOFOL 500 MG/50ML IV EMUL
INTRAVENOUS | Status: DC | PRN
  Administered 2020-09-14: 100 ug/kg/min via INTRAVENOUS

## 2020-09-14 NOTE — Anesthesia Preprocedure Evaluation (Signed)
Anesthesia Evaluation  Patient identified by MRN, date of birth, ID band Patient awake    Reviewed: Allergy & Precautions, H&P , NPO status , Patient's Chart, lab work & pertinent test results, reviewed documented beta blocker date and time   History of Anesthesia Complications (+) PONV and history of anesthetic complications  Airway Mallampati: II  TM Distance: >3 FB Neck ROM: full    Dental no notable dental hx. (+) Teeth Intact   Pulmonary asthma ,    Pulmonary exam normal breath sounds clear to auscultation       Cardiovascular Exercise Tolerance: Good hypertension, negative cardio ROS   Rhythm:regular Rate:Normal     Neuro/Psych  Headaches,  Neuromuscular disease negative psych ROS   GI/Hepatic Neg liver ROS, GERD  Medicated,  Endo/Other  negative endocrine ROS  Renal/GU negative Renal ROS  negative genitourinary   Musculoskeletal   Abdominal   Peds  Hematology  (+) Blood dyscrasia, anemia ,   Anesthesia Other Findings   Reproductive/Obstetrics negative OB ROS                             Anesthesia Physical Anesthesia Plan  ASA: III  Anesthesia Plan: General   Post-op Pain Management:    Induction:   PONV Risk Score and Plan: Propofol infusion  Airway Management Planned:   Additional Equipment:   Intra-op Plan:   Post-operative Plan:   Informed Consent: I have reviewed the patients History and Physical, chart, labs and discussed the procedure including the risks, benefits and alternatives for the proposed anesthesia with the patient or authorized representative who has indicated his/her understanding and acceptance.     Dental Advisory Given  Plan Discussed with: CRNA  Anesthesia Plan Comments:         Anesthesia Quick Evaluation

## 2020-09-14 NOTE — Discharge Instructions (Signed)
No aspirin or NSAIDs for 24 hours. Resume usual medications and high-fiber diet. No driving for 24 hours. Physician will call with biopsy results.   Colonoscopy, Adult, Care After This sheet gives you information about how to care for yourself after your procedure. Your doctor may also give you more specific instructions. If you have problems or questions, call your doctor. What can I expect after the procedure? After the procedure, it is common to have:  A small amount of blood in your poop (stool) for 24 hours.  Some gas.  Mild cramping or bloating in your belly (abdomen). Follow these instructions at home: Eating and drinking   Drink enough fluid to keep your pee (urine) pale yellow.  Follow instructions from your doctor about what you cannot eat or drink.  Return to your normal diet as told by your doctor. Avoid heavy or fried foods that are hard to digest. Activity  Rest as told by your doctor.  Do not sit for a long time without moving. Get up to take short walks every 1-2 hours. This is important. Ask for help if you feel weak or unsteady.  Return to your normal activities as told by your doctor. Ask your doctor what activities are safe for you. To help cramping and bloating:   Try walking around.  Put heat on your belly as told by your doctor. Use the heat source that your doctor recommends, such as a moist heat pack or a heating pad. ? Put a towel between your skin and the heat source. ? Leave the heat on for 20-30 minutes. ? Remove the heat if your skin turns bright red. This is very important if you are unable to feel pain, heat, or cold. You may have a greater risk of getting burned. General instructions  For the first 24 hours after the procedure: ? Do not drive or use machinery. ? Do not sign important documents. ? Do not drink alcohol. ? Do your daily activities more slowly than normal. ? Eat foods that are soft and easy to digest.  Take  over-the-counter or prescription medicines only as told by your doctor.  Keep all follow-up visits as told by your doctor. This is important. Contact a doctor if:  You have blood in your poop 2-3 days after the procedure. Get help right away if:  You have more than a small amount of blood in your poop.  You see large clumps of tissue (blood clots) in your poop.  Your belly is swollen.  You feel like you may vomit (nauseous).  You vomit.  You have a fever.  You have belly pain that gets worse, and medicine does not help your pain. Summary  After the procedure, it is common to have a small amount of blood in your poop. You may also have mild cramping and bloating in your belly.  For the first 24 hours after the procedure, do not drive or use machinery, do not sign important documents, and do not drink alcohol.  Get help right away if you have a lot of blood in your poop, feel like you may vomit, have a fever, or have more belly pain. This information is not intended to replace advice given to you by your health care provider. Make sure you discuss any questions you have with your health care provider. Document Revised: 06/22/2019 Document Reviewed: 06/22/2019 Elsevier Patient Education  [place].

## 2020-09-14 NOTE — Transfer of Care (Signed)
Immediate Anesthesia Transfer of Care Note  Patient: YOLANDO GILLUM  Procedure(s) Performed: COLONOSCOPY WITH PROPOFOL (N/A ) POLYPECTOMY  Patient Location: PACU  Anesthesia Type:General  Level of Consciousness: drowsy and patient cooperative  Airway & Oxygen Therapy: Patient Spontanous Breathing  Post-op Assessment: Report given to RN and Post -op Vital signs reviewed and stable  Post vital signs: Reviewed and stable  Last Vitals:  Vitals Value Taken Time  BP 76/41 09/14/20 0755  Temp    Pulse 70 09/14/20 0757  Resp 18 09/14/20 0757  SpO2 94 % 09/14/20 0757  Vitals shown include unvalidated device data.  Last Pain:  Vitals:   09/14/20 0725  TempSrc:   PainSc: 3          Complications: No complications documented.

## 2020-09-14 NOTE — Op Note (Signed)
[facility] Patient Name: [name] Procedure Date: 09/14/2020 7:06 AM MRN: [id number] Date of Birth: 10-21-1966 Attending MD: Hildred Laser , MD CSN: [id number] Age: 54 Admit Type: Outpatient Procedure:                Colonoscopy Indications:              High risk colon cancer surveillance: Personal                            history of colonic polyps Providers:                Hildred Laser, MD, Crystal Page, Rosina Lowenstein, RN Referring MD:             Asencion Noble, MD Medicines:                Propofol per Anesthesia Complications:            No immediate complications. Estimated Blood Loss:     Estimated blood loss was minimal. Procedure:                Pre-Anesthesia Assessment:                           - Prior to the procedure, a History and Physical                            was performed, and patient medications and                            allergies were reviewed. The patient's tolerance of                            previous anesthesia was also reviewed. The risks                            and benefits of the procedure and the sedation                            options and risks were discussed with the patient.                            All questions were answered, and informed consent                            was obtained. Prior Anticoagulants: The patient has                            taken no previous anticoagulant or antiplatelet                            agents except for NSAID medication. ASA Grade                            Assessment: II - A patient with mild systemic  disease. After reviewing the risks and benefits,                            the patient was deemed in satisfactory condition to                            undergo the procedure.                           After obtaining informed consent, the colonoscope                            was passed under direct vision. Throughout the                             procedure, the patient's blood pressure, pulse, and                            oxygen saturations were monitored continuously. The                            [id number]-[id number] ([id number]) scope was introduced through                            the anus and advanced to the the terminal ileum,                            with identification of the appendiceal orifice and                            IC valve. The colonoscopy was performed without                            difficulty. The patient tolerated the procedure                            well. The quality of the bowel preparation was                            good. The terminal ileum, ileocecal valve,                            appendiceal orifice, and rectum were photographed. Scope In: 7:30:56 AM Scope Out: 7:52:01 AM Scope Withdrawal Time: 0 hours 14 minutes 57 seconds  Total Procedure Duration: 0 hours 21 minutes 5 seconds  Findings:      The perianal and digital rectal examinations were normal.      The terminal ileum appeared normal.      A 6 mm polyp was found in the splenic flexure. The polyp was flat. The       polyp was removed with a cold snare. Resection and retrieval were       complete. The pathology specimen was placed into Bottle Number 1.      A small polyp was found in the sigmoid colon. The polyp was sessile.  Biopsies were taken with a cold forceps for histology. The pathology       specimen was placed into Bottle Number 1.      A few small-mouthed diverticula were found in the sigmoid colon.      The retroflexed view of the distal rectum and anal verge was normal and       showed no anal or rectal abnormalities. Impression:               - The examined portion of the ileum was normal.                           - One 6 mm polyp at the splenic flexure, removed                            with a cold snare. Resected and retrieved.                           - One small polyp in the sigmoid colon. Biopsied.                            - Diverticulosis in the sigmoid colon. Moderate Sedation:      Per Anesthesia Care Recommendation:           - Patient has a contact number available for                            emergencies. The signs and symptoms of potential                            delayed complications were discussed with the                            patient. Return to normal activities tomorrow.                            Written discharge instructions were provided to the                            patient.                           - High fiber diet today.                           - Continue present medications.                           - No aspirin, ibuprofen, naproxen, or other                            non-steroidal anti-inflammatory drugs for 1 day.                           - Await pathology results.                           -  Repeat colonoscopy in 5 years for surveillance. Procedure Code(s):        --- Professional ---                           [phone], Colonoscopy, flexible; with removal of                            tumor(s), polyp(s), or other lesion(s) by snare                            technique                           45380, 74, Colonoscopy, flexible; with biopsy,                            single or multiple Diagnosis Code(s):        --- Professional ---                           Z86.010, Personal history of colonic polyps                           K63.5, Polyp of colon                           K57.30, Diverticulosis of large intestine without                            perforation or abscess without bleeding CPT copyright 2019 American Medical Association. All rights reserved. The codes documented in this report are preliminary and upon coder review may  be revised to meet current compliance requirements. Hildred Laser, MD Hildred Laser, MD 09/14/2020 8:01:57 AM This report has been signed electronically. Number of Addenda: 0

## 2020-09-14 NOTE — Anesthesia Postprocedure Evaluation (Signed)
Anesthesia Post Note  Patient: [name]  Procedure(s) Performed: COLONOSCOPY WITH PROPOFOL (N/A ) POLYPECTOMY  Patient location during evaluation: PACU Anesthesia Type: General Level of consciousness: oriented and awake and alert Pain management: pain level controlled Vital Signs Assessment: post-procedure vital signs reviewed and stable Respiratory status: nonlabored ventilation, spontaneous breathing and respiratory function stable Cardiovascular status: blood pressure returned to baseline Postop Assessment: no apparent nausea or vomiting Anesthetic complications: no   No complications documented.   Last Vitals:  Vitals:   09/14/20 0815 09/14/20 0824  BP: 102/63 108/76  Pulse: (!) 58 (!) 55  Resp: 14 18  Temp:  36.8 C  SpO2: 93% 96%    Last Pain:  Vitals:   09/14/20 0824  TempSrc: Oral  PainSc: 0-No pain                 Orlie Dakin

## 2020-09-14 NOTE — Anesthesia Procedure Notes (Signed)
Date/Time: 09/14/2020 7:42 AM Performed by: Orlie Dakin, CRNA Pre-anesthesia Checklist: Patient identified, Emergency Drugs available, Suction available and Patient being monitored Patient Re-evaluated:Patient Re-evaluated prior to induction Oxygen Delivery Method: Nasal cannula Induction Type: IV induction Placement Confirmation: positive ETCO2

## 2020-09-14 NOTE — H&P (Signed)
[name] is an 54 y.o. female.   Chief Complaint: Patient is here for colonoscopy. HPI: Patient is 54 year old Caucasian female who has history of colonic adenomas and is here for surveillance colonoscopy.  She denies change in bowel habits or rectal bleeding.  She had 4 tubular adenomas on her first colonoscopy May 2014 but none 2 years later.  She continues to complain of postprandial epigastric pain.  She was hospitalized in July last year with jejunitis and treated with antibiotics.  Changes of jejunitis have resolved on follow-up CTs.  She does not take any NSAIDs. Family history significant for colon carcinoma in maternal grandmother who was 24 at the time of diagnosis and died at age 66.  Past Medical History:  Diagnosis Date  . Arthritis   . Asthma    triggered by weather changes; no inhaler  . Cancer ([place])    Cervical  . Collagen vascular disease ([place])   . Fibromyalgia   . Ganglion cyst of wrist 12/2012   left dorsal cyst  . GERD (gastroesophageal reflux disease)    TUMS as needed  . Headache(784.0)    tension/migraines  . Hypercholesteremia   . Hypertension   . PONV (postoperative nausea and vomiting)    also states stopped breathing after hernia surgery  . Rectal bleeding   . Rheumatoid arthritis ([place])   . Stenosing tenosynovitis of finger 12/2012   left middle finger  . 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 [facility])     Past Surgical History:  Procedure Laterality Date  . ABDOMINAL HYSTERECTOMY  1994  . APPENDECTOMY  1996  . BIOPSY  12/01/2018   Procedure: BIOPSY;  Surgeon: Rogene Houston, MD;  [facility];  Service: Endoscopy;;  gastric   . BIOPSY N/A 10/16/2019   Procedure: DUODENUM and JEJUNUM BIOPSY;  Surgeon: Rogene Houston, MD;  [facility];  Service: Endoscopy;  Laterality: N/A;  . COLONOSCOPY N/A 05/08/2013   Procedure: COLONOSCOPY;  Surgeon: Rogene Houston, MD;  [facility];  Service: Endoscopy;  Laterality: N/A;   240  . COLONOSCOPY N/A 09/23/2015   Procedure: COLONOSCOPY;  Surgeon: Rogene Houston, MD;  [facility];  Service: Endoscopy;  Laterality: N/A;  825  . CYSTOSCOPY WITH HYDRODISTENSION AND BIOPSY  04/01/2007  . [place]   left tube and ovary removed  . ENTEROSCOPY N/A 10/16/2019   Procedure: PUSH ENTEROSCOPY;  Surgeon: Rogene Houston, MD;  [facility];  Service: Endoscopy;  Laterality: N/A;  . ESOPHAGOGASTRODUODENOSCOPY N/A 12/01/2018   Procedure: ESOPHAGOGASTRODUODENOSCOPY (EGD);  Surgeon: Rogene Houston, MD;  [facility];  Service: Endoscopy;  Laterality: N/A;  12:45  . ESOPHAGOGASTRODUODENOSCOPY (EGD) WITH PROPOFOL N/A 10/16/2019   Procedure: ESOPHAGOGASTRODUODENOSCOPY (EGD) WITH PROPOFOL;  Surgeon: Rogene Houston, MD;  [facility];  Service: Endoscopy;  Laterality: N/A;  1430pm  . GANGLION CYST EXCISION  12/17/2012   Procedure: REMOVAL GANGLION OF WRIST;  Surgeon: Wynonia Sours, MD;  Location: [place];  Service: Orthopedics;  Laterality: Left;  . HARDWARE REMOVAL Right 08/09/2014   Procedure: Placement Right Ankle Tight Rope, Removal Plate and Screws;  Surgeon: Marybelle Killings, MD;  Location: [place];  Service: Orthopedics;  Laterality: Right;  . PUBOVAGINAL SLING  2010  . rt ankle surgery    . SALPINGOOPHORECTOMY  1996   right  . TRIGGER FINGER RELEASE  12/17/2012   Procedure: RELEASE TRIGGER FINGER/A-1  PULLEY;  Surgeon: Wynonia Sours, MD;  Location: [place];  Service: Orthopedics;  Laterality: Left;  . TUBAL LIGATION  1992  . VAGINA RECONSTRUCTION SURGERY     x 5 since 2010  . VENTRAL HERNIA REPAIR  09/06/2006    Family History  Problem Relation Age of Onset  . Lung cancer Mother   . Cervical cancer Mother   . Heart disease Father   . Leukemia Father   . Cancer Maternal Grandmother        bladder and colon cancer   Social History:  reports that she has never smoked. She has never  used smokeless tobacco. She reports that she does not drink alcohol and does not use drugs.  Allergies:  Allergies  Allergen Reactions  . Imitrex [Sumatriptan]     Causes a migraine  . Cymbalta [Duloxetine Hcl] Rash  . Latex Rash  . Propoxyphene N-Acetaminophen Nausea And Vomiting and Rash  . Tramadol Rash    Medications Prior to Admission  Medication Sig Dispense Refill  . albuterol (VENTOLIN HFA) 108 (90 Base) MCG/ACT inhaler Inhale 2 puffs into the lungs every 4 (four) hours as needed for wheezing or shortness of breath.     . Biotin 5 MG TABS Take 5 mg by mouth daily.     . Cholecalciferol (VITAMIN D3) 125 MCG (5000 UT) CAPS Take 5,000 Units by mouth daily.     . cyanocobalamin (,VITAMIN B-12,) 1000 MCG/ML injection Inject 1 mL intramuscularly monthly. (Patient taking differently: Inject 1,000 mcg into the muscle every 30 (thirty) days. ) 15 mL 1  . dicyclomine (BENTYL) 10 MG capsule TAKE 1 CAPSULE (10 MG TOTAL) BY MOUTH 3 (THREE) TIMES DAILY BEFORE MEALS. (Patient taking differently: Take 10 mg by mouth 2 (two) times daily before a meal. ) 270 capsule 5  . estradiol (ESTRACE) 2 MG tablet TAKE 1 TABLET BY MOUTH EVERY DAY (Patient taking differently: Take 2 mg by mouth daily. ) 90 tablet 3  . ferrous sulfate 325 (65 FE) MG tablet Take 325 mg by mouth daily with breakfast.    . gabapentin (NEURONTIN) 400 MG capsule Take 1 capsule (400 mg total) by mouth at bedtime. 90 capsule 0  . ibuprofen (ADVIL) 200 MG tablet Take 400-600 mg by mouth every 6 (six) hours as needed for moderate pain.     Marland Kitchen lisinopril (ZESTRIL) 5 MG tablet Take 1 tablet (5 mg total) by mouth at bedtime. 90 tablet 3  . Multiple Vitamins-Minerals (ONE-A-DAY WOMENS 50 PLUS PO) Take 1 tablet by mouth daily.     . ondansetron (ZOFRAN-ODT) 8 MG disintegrating tablet DISSOLVE 1 TABLET ON THE TONGUE EVERY 8 HOURS AS NEEDED FOR NAUSEA AND VOMITING (Patient taking differently: Take 8 mg by mouth every 8 (eight) hours as needed for  nausea or vomiting. ) 12 tablet 4  . oxyCODONE (ROXICODONE) 5 MG immediate release tablet Take 1 tablet (5 mg total) by mouth every 4 (four) hours as needed for severe pain. 10 tablet 0  . pantoprazole (PROTONIX) 40 MG tablet TAKE 1 TABLET BY MOUTH EVERY DAY (Patient taking differently: Take 40 mg by mouth daily. ) 90 tablet 1  . promethazine (PHENERGAN) 25 MG tablet Take 1 tablet (25 mg total) by mouth every 6 (six) hours as needed for nausea or vomiting. 20 tablet 0  . acetaminophen (TYLENOL) 325 MG tablet Take 2 tablets (650 mg total) by mouth every 6 (six) hours as needed for mild pain, fever or headache. (Patient  not taking: Reported on 09/02/2020) 30 tablet 0  . mirabegron ER (MYRBETRIQ) 25 MG TB24 tablet Take 1 tablet (25 mg total) by mouth daily. (Patient not taking: Reported on 07/13/2020) 30 tablet 0  . sucralfate (CARAFATE) 1 g tablet Take 1 tablet (1 g total) by mouth 2 (two) times daily before a meal. (Patient not taking: Reported on 09/02/2020) 60 tablet 3    Results for orders placed or performed during the hospital encounter of 09/12/20 (from the past 48 hour(s))  SARS CORONAVIRUS 2 (TAT 6-24 HRS) Nasopharyngeal Nasopharyngeal Swab     Status: None   Collection Time: 09/12/20  8:54 AM   Specimen: Nasopharyngeal Swab  Result Value Ref Range   SARS Coronavirus 2 NEGATIVE NEGATIVE    Comment: (NOTE) SARS-CoV-2 target nucleic acids are NOT DETECTED.  The SARS-CoV-2 RNA is generally detectable in upper and lower respiratory specimens during the acute phase of infection. Negative results do not preclude SARS-CoV-2 infection, do not rule out co-infections with other pathogens, and should not be used as the sole basis for treatment or other patient management decisions. Negative results must be combined with clinical observations, patient history, and epidemiological information. The expected result is Negative.  Fact Sheet for  Patients: SugarRoll.be  Fact Sheet for Healthcare Providers: https://www.woods-mathews.com/  This test is not yet approved or cleared by the [place] FDA and  has been authorized for detection and/or diagnosis of SARS-CoV-2 by FDA under an Emergency Use Authorization (EUA). This EUA will remain  in effect (meaning this test can be used) for the duration of the COVID-19 declaration under Se ction 564(b)(1) of the Act, 21 U.S.C. section 360bbb-3(b)(1), unless the authorization is terminated or revoked sooner.  Performed at [facility], [place] [address] [place], [place] [address]    No results found.  Review of Systems  Blood pressure 106/71, temperature 98.2 F (36.8 C), temperature source Oral, resp. rate 18, SpO2 97 %. Physical Exam HENT:     Mouth/Throat:     Mouth: Mucous membranes are moist.     Pharynx: Oropharynx is clear.  Eyes:     General: No scleral icterus.    Conjunctiva/sclera: Conjunctivae normal.  Cardiovascular:     Rate and Rhythm: Normal rate and regular rhythm.     Heart sounds: Normal heart sounds. No murmur heard.   Pulmonary:     Effort: Pulmonary effort is normal.     Breath sounds: Normal breath sounds.  Abdominal:     Comments: Abdomen is symmetrical.  It is soft.  She has mild midepigastric tenderness.  No organomegaly or masses.  Musculoskeletal:        General: No swelling.     Cervical back: Neck supple.  Lymphadenopathy:     Cervical: No cervical adenopathy.  Skin:    General: Skin is warm and dry.  Neurological:     Mental Status: She is alert.      Assessment/Plan  History of colonic adenomas. Surveillance colonoscopy.  Hildred Laser, MD 09/14/2020, 7:19 AM

## 2020-09-15 LAB — SURGICAL PATHOLOGY

## 2020-09-20 ENCOUNTER — Encounter (INDEPENDENT_AMBULATORY_CARE_PROVIDER_SITE_OTHER): Payer: Self-pay

## 2020-09-20 ENCOUNTER — Encounter (HOSPITAL_COMMUNITY): Payer: Self-pay | Admitting: Internal Medicine

## 2020-09-21 ENCOUNTER — Encounter (INDEPENDENT_AMBULATORY_CARE_PROVIDER_SITE_OTHER): Payer: Self-pay | Admitting: *Deleted

## 2020-09-21 ENCOUNTER — Telehealth (INDEPENDENT_AMBULATORY_CARE_PROVIDER_SITE_OTHER): Payer: Self-pay | Admitting: *Deleted

## 2020-09-21 MED ORDER — OXYCODONE HCL 5 MG PO TABS
5.0000 mg | ORAL_TABLET | ORAL | 0 refills | Status: DC | PRN
Start: 2020-09-21 — End: 2021-04-14

## 2020-09-21 NOTE — Addendum Note (Signed)
Addended by: Laurine Blazer A on: 09/21/2020 01:18 PM   Modules accepted: Orders

## 2020-09-21 NOTE — Telephone Encounter (Signed)
Please notify patient that I will send 1 refill of oxycodone to her pharmacy but we generally do not use oxycodone long-term to treat abdominal pain.  I would like her to follow-up with Dr. Laural Golden to discuss long-term treatment plan. Thanks.

## 2020-09-21 NOTE — Telephone Encounter (Signed)
Noted. Patient was sent a message in [facility] Chart , with Janice's recommendation and plan.

## 2020-09-21 NOTE — Telephone Encounter (Signed)
Patient sent in a request tor Oxycodone 5 mg  to be refilled. I ask the patient if she was having pain , where was her pain, and on a scale from 1- 10 how would she rate her pain. Below is her response.  My pain has moved from a 10+  To a 5 today. Monday I was hurting so bad 10++ and I was vomiting all day I couldn't keep anything down  In looking at her medication list , I see that a Jaclyn Jefferson PA-C did this Rx on 06/08/2020 as follows  Oxycodone 5 mg  Take 1 5 mg by mouth every 4 hours as needed for severe pain. #10.  Please advise if [name] can refill or she may need to get from her PCP?  Patient had TCS 09/14/2020 , last OV with Thayer Headings 07/13/2020 and she has also had a recent CT Abd /Pelvic.

## 2020-12-19 ENCOUNTER — Other Ambulatory Visit (INDEPENDENT_AMBULATORY_CARE_PROVIDER_SITE_OTHER): Payer: Self-pay | Admitting: Gastroenterology

## 2020-12-19 ENCOUNTER — Encounter (INDEPENDENT_AMBULATORY_CARE_PROVIDER_SITE_OTHER): Payer: Self-pay

## 2020-12-27 ENCOUNTER — Other Ambulatory Visit (HOSPITAL_COMMUNITY): Payer: Self-pay | Admitting: Obstetrics & Gynecology

## 2020-12-27 DIAGNOSIS — Z1231 Encounter for screening mammogram for malignant neoplasm of breast: Secondary | ICD-10-CM

## 2021-01-02 ENCOUNTER — Other Ambulatory Visit (INDEPENDENT_AMBULATORY_CARE_PROVIDER_SITE_OTHER): Payer: Self-pay

## 2021-01-02 ENCOUNTER — Encounter (INDEPENDENT_AMBULATORY_CARE_PROVIDER_SITE_OTHER): Payer: Self-pay

## 2021-01-02 MED ORDER — PANTOPRAZOLE SODIUM 40 MG PO TBEC
40.0000 mg | DELAYED_RELEASE_TABLET | Freq: Every day | ORAL | 3 refills | Status: DC
Start: 2021-01-02 — End: 2022-01-15

## 2021-02-13 ENCOUNTER — Other Ambulatory Visit: Payer: Self-pay

## 2021-02-13 ENCOUNTER — Ambulatory Visit (HOSPITAL_COMMUNITY)
Admission: RE | Admit: 2021-02-13 | Discharge: 2021-02-13 | Disposition: A | Discharge status: Home / Self Care | Payer: 59 | Source: Ambulatory Visit | Attending: Obstetrics & Gynecology | Admitting: Obstetrics & Gynecology

## 2021-02-13 DIAGNOSIS — Z1231 Encounter for screening mammogram for malignant neoplasm of breast: Secondary | ICD-10-CM | POA: Diagnosis present

## 2021-02-17 ENCOUNTER — Encounter: Payer: Self-pay | Admitting: Adult Health

## 2021-02-17 ENCOUNTER — Ambulatory Visit (INDEPENDENT_AMBULATORY_CARE_PROVIDER_SITE_OTHER): Payer: 59 | Admitting: Adult Health

## 2021-02-17 ENCOUNTER — Other Ambulatory Visit: Payer: Self-pay

## 2021-02-17 VITALS — BP 148/92 | HR 101 | Ht 63.75 in | Wt 188.0 lb

## 2021-02-17 DIAGNOSIS — Z79899 Other long term (current) drug therapy: Secondary | ICD-10-CM

## 2021-02-17 DIAGNOSIS — F419 Anxiety disorder, unspecified: Secondary | ICD-10-CM | POA: Diagnosis not present

## 2021-02-17 DIAGNOSIS — Z01419 Encounter for gynecological examination (general) (routine) without abnormal findings: Secondary | ICD-10-CM

## 2021-02-17 DIAGNOSIS — F32A Depression, unspecified: Secondary | ICD-10-CM | POA: Insufficient documentation

## 2021-02-17 DIAGNOSIS — Z1211 Encounter for screening for malignant neoplasm of colon: Secondary | ICD-10-CM | POA: Diagnosis not present

## 2021-02-17 LAB — HEMOCCULT GUIAC POC 1CARD (OFFICE): Fecal Occult Blood, POC: NEGATIVE

## 2021-02-17 MED ORDER — ESTRADIOL 2 MG PO TABS: 2.0000 mg | ORAL_TABLET | Freq: Every day | ORAL | 3 refills | Status: DC

## 2021-02-17 MED ORDER — ESCITALOPRAM OXALATE 10 MG PO TABS: 10.0000 mg | ORAL_TABLET | Freq: Every day | ORAL | 2 refills | Status: DC

## 2021-02-17 NOTE — Progress Notes (Signed)
Patient ID: [name], female   DOB: June 10, 1966, 55 y.o.   MRN: [id number] History of Present Illness:  [name] is a 55 year old white female,married,sp hysterectomy, in for a well woman gyn exam. PCP is Dr Willey Blade.  Current Medications, Allergies, Past Medical History, Past Surgical History, Family History and Social History were reviewed in [employer] record.     Review of Systems: Patient denies any headaches, hearing loss, fatigue, blurred vision, shortness of breath, chest pain, abdominal pain, problems with bowel movements(constipated at times), urination, or intercourse. No joint pain or mood swings. Not sleeping well, has some depression, dad in hospital and father in law died recently    Physical Exam:BP (!) 148/92 (BP Location: Left Arm, Patient Position: Sitting, Cuff Size: Normal)   Pulse (!) 101   Ht 5' 3.75" (1.619 m)   Wt 188 lb (85.3 kg)   BMI 32.52 kg/m  General:  Well developed, well nourished, no acute distress Skin:  Warm and dry Neck:  Midline trachea, normal thyroid, good ROM, no lymphadenopathy Lungs; Clear to auscultation bilaterally Breast:  No dominant palpable mass, retraction, or nipple discharge Cardiovascular: Regular rate and rhythm Abdomen:  Soft, non tender, no hepatosplenomegaly Pelvic:  External genitalia is normal in appearance, no lesions.  The vagina is pale with fair moisture and loss of rugae,no lesions at vaginal cuff. Urethra has no lesions or masses. The cervix and uterus are absent.  No adnexal masses or tenderness noted.Bladder is non tender, no masses felt. Rectal: Good sphincter tone, no polyps, or hemorrhoids felt.  Hemoccult negative. Extremities/musculoskeletal:  No swelling or varicosities noted, no clubbing or cyanosis Psych:  No mood changes, alert and cooperative,seems happy AA is 0 Fall risk is low PHQ 9 score is 5 GAD 7 score is 7  [facility] - 02/17/21 0839      Pregnancy Intention Screening   Does the  patient want to become pregnant in the next year? N/A    Does the patient's partner want to become pregnant in the next year? N/A    Would the patient like to discuss contraceptive options today? N/A      Contraception Wrap Up   Current Method Female Sterilization    End Method Female Sterilization    Contraception Counseling Provided No         Examination chaperoned by Celene Squibb LPN  Impression and Plan: 1. Encounter for well woman exam with routine gynecological exam Physical in 1 year Labs with PCP, has visit 03/03/21 with Dr Willey Blade Mammogram yearly Colonoscopy per Dr Laural Golden   2. Encounter for screening fecal occult blood testing   3. Anxiety and depression Will rx lexapro 10 mg and follow up in 8 weeks   Meds ordered this encounter  Medications  . escitalopram (LEXAPRO) 10 MG tablet    Sig: Take 1 tablet (10 mg total) by mouth daily.    Dispense:  30 tablet    Refill:  2    Order Specific Question:   Supervising Provider    Answer:   Elonda Husky, LUTHER H [[id number]]  . estradiol (ESTRACE) 2 MG tablet    Sig: Take 1 tablet (2 mg total) by mouth daily.    Dispense:  90 tablet    Refill:  3    Order Specific Question:   Supervising Provider    Answer:   Elonda Husky, LUTHER H [[id number]]   4. Estrogen therapy Will refill estrace 2 mg

## 2021-02-22 ENCOUNTER — Other Ambulatory Visit (INDEPENDENT_AMBULATORY_CARE_PROVIDER_SITE_OTHER): Payer: Self-pay | Admitting: Internal Medicine

## 2021-02-27 ENCOUNTER — Other Ambulatory Visit: Payer: Self-pay | Admitting: Adult Health

## 2021-02-27 MED ORDER — SERTRALINE HCL 25 MG PO TABS: 25.0000 mg | ORAL_TABLET | Freq: Every day | ORAL | 1 refills | Status: DC

## 2021-02-27 NOTE — Progress Notes (Signed)
Will stop lexapro and try 25 mg zoloft

## 2021-02-28 ENCOUNTER — Encounter (INDEPENDENT_AMBULATORY_CARE_PROVIDER_SITE_OTHER): Payer: Self-pay

## 2021-03-03 ENCOUNTER — Telehealth: Payer: Self-pay | Admitting: Orthopedic Surgery

## 2021-03-03 NOTE — Telephone Encounter (Signed)
Call received from Kathlee Nations at Dr Ria Comment office inquiring about possible work in appointment for today for problem of ?sprain of ankle; said patient has had no Xrays yet. Per practice manager Oliviarose, suggests option of Urgent care in [place] for Xray and possible boot/brace, or our [place] office, due to our [place] office closing and clinic completed for today.  Kathlee Nations will proceed with one of these options.

## 2021-03-21 ENCOUNTER — Other Ambulatory Visit: Payer: Self-pay | Admitting: Adult Health

## 2021-03-27 ENCOUNTER — Other Ambulatory Visit: Payer: Self-pay | Admitting: *Deleted

## 2021-03-27 MED ORDER — LISINOPRIL 5 MG PO TABS
5.0000 mg | ORAL_TABLET | Freq: Every day | ORAL | 0 refills | Status: DC
Start: 2021-03-27 — End: 2022-01-01

## 2021-04-14 ENCOUNTER — Other Ambulatory Visit: Payer: Self-pay

## 2021-04-14 ENCOUNTER — Ambulatory Visit (INDEPENDENT_AMBULATORY_CARE_PROVIDER_SITE_OTHER): Payer: 59 | Admitting: Adult Health

## 2021-04-14 ENCOUNTER — Encounter: Payer: Self-pay | Admitting: Adult Health

## 2021-04-14 VITALS — BP 128/76 | HR 81 | Ht 63.0 in | Wt 188.0 lb

## 2021-04-14 DIAGNOSIS — R5383 Other fatigue: Secondary | ICD-10-CM | POA: Diagnosis not present

## 2021-04-14 DIAGNOSIS — F419 Anxiety disorder, unspecified: Secondary | ICD-10-CM | POA: Diagnosis not present

## 2021-04-14 DIAGNOSIS — F32A Depression, unspecified: Secondary | ICD-10-CM | POA: Diagnosis not present

## 2021-04-14 MED ORDER — CHROMAGEN PO CAPS: 1.0000 | ORAL_CAPSULE | Freq: Every day | ORAL | 0 refills | Status: DC

## 2021-04-14 NOTE — Progress Notes (Signed)
  Subjective:     Patient ID: [name], female   DOB: 07-15-1966, 55 y.o.   MRN: [id number]  HPI [name] is a 55 year old white female, married, sp hysterectomy back in follow up on trying lexapro, felt weird and stopped, then tried Zoloft 25 mg and stopped it too. She has labs last year with Dr Laural Golden Hgb was 11, TSH normal. PCP is Dr Willey Blade.  Review of Systems +tired Not really depressed and anxious now but does not want to do anything Reviewed past medical,surgical, social and family history. Reviewed medications and allergies.     Objective:   Physical Exam BP 128/76 (BP Location: Left Arm, Patient Position: Sitting, Cuff Size: Normal)   Pulse 81   Ht 5\' 3"  (1.6 m)   Wt 188 lb (85.3 kg)   BMI 33.30 kg/m  Skin warm and dry. Lungs: clear to ausculation bilaterally. Cardiovascular: regular rate and rhythm.   Fall risk is low PHQ 9 score is 7 GAD 7 score is 3  [facility] - 04/14/21 1303      Pregnancy Intention Screening   Does the patient want to become pregnant in the next year? N/A    Does the patient's partner want to become pregnant in the next year? N/A    Would the patient like to discuss contraceptive options today? N/A      Contraception Wrap Up   Current Method Female Sterilization   hyst   End Method Female Sterilization   hyst   Contraception Counseling Provided No          Assessment:     1. Tired Will try adding chromagen #15 capsules given take 1 daily   2. Anxiety and depression Declines meds or counseling Try to get outside more    Plan:     Follow up prn But call me in 2 weeks to see how feels on chromagen

## 2021-05-19 ENCOUNTER — Other Ambulatory Visit (INDEPENDENT_AMBULATORY_CARE_PROVIDER_SITE_OTHER): Payer: Self-pay

## 2021-05-19 ENCOUNTER — Encounter (INDEPENDENT_AMBULATORY_CARE_PROVIDER_SITE_OTHER): Payer: Self-pay

## 2021-05-19 DIAGNOSIS — E538 Deficiency of other specified B group vitamins: Secondary | ICD-10-CM

## 2021-05-19 DIAGNOSIS — D519 Vitamin B12 deficiency anemia, unspecified: Secondary | ICD-10-CM

## 2021-05-30 LAB — CBC WITH DIFFERENTIAL/PLATELET
Absolute Monocytes: 627 cells/uL (ref 200–950)
Basophils Absolute: 33 cells/uL (ref 0–200)
Basophils Relative: 0.5 %
Eosinophils Absolute: 158 cells/uL (ref 15–500)
Eosinophils Relative: 2.4 %
HCT: 33.9 % — ABNORMAL LOW (ref 35.0–45.0)
Hemoglobin: 11.4 g/dL — ABNORMAL LOW (ref 11.7–15.5)
Lymphs Abs: 2402 cells/uL (ref 850–3900)
MCH: 33.6 pg — ABNORMAL HIGH (ref 27.0–33.0)
MCHC: 33.6 g/dL (ref 32.0–36.0)
MCV: 100 fL (ref 80.0–100.0)
MPV: 9.5 fL (ref 7.5–12.5)
Monocytes Relative: 9.5 %
Neutro Abs: 3379 cells/uL (ref 1500–7800)
Neutrophils Relative %: 51.2 %
Platelets: 234 10*3/uL (ref 140–400)
RBC: 3.39 10*6/uL — ABNORMAL LOW (ref 3.80–5.10)
RDW: 12.6 % (ref 11.0–15.0)
Total Lymphocyte: 36.4 %
WBC: 6.6 10*3/uL (ref 3.8–10.8)

## 2021-05-30 LAB — B12 AND FOLATE PANEL
Folate: 7.4 ng/mL
Vitamin B-12: 328 pg/mL (ref 200–1100)

## 2021-06-08 ENCOUNTER — Ambulatory Visit (INDEPENDENT_AMBULATORY_CARE_PROVIDER_SITE_OTHER): Payer: 59

## 2021-06-08 ENCOUNTER — Ambulatory Visit
Admission: EM | Admit: 2021-06-08 | Discharge: 2021-06-08 | Disposition: A | Discharge status: Home / Self Care | Payer: 59 | Attending: Emergency Medicine | Admitting: Emergency Medicine

## 2021-06-08 ENCOUNTER — Encounter: Payer: Self-pay | Admitting: Emergency Medicine

## 2021-06-08 ENCOUNTER — Other Ambulatory Visit: Payer: Self-pay

## 2021-06-08 DIAGNOSIS — M25571 Pain in right ankle and joints of right foot: Secondary | ICD-10-CM

## 2021-06-08 DIAGNOSIS — S93401A Sprain of unspecified ligament of right ankle, initial encounter: Secondary | ICD-10-CM

## 2021-06-08 MED ORDER — PREDNISONE 20 MG PO TABS: 20.0000 mg | ORAL_TABLET | Freq: Two times a day (BID) | ORAL | 0 refills | Status: AC

## 2021-06-08 NOTE — ED Provider Notes (Signed)
[place]   [id number] 06/08/21 Arrival Time: 1729  CC: Rt ankle PAIN  SUBJECTIVE: History from: patient. QUETZALLY CALLAS is a 55 y.o. female complains of RT ankle pain and swelling x 2 weeks.  Denies a precipitating event or specific injury.  Reports recent travel to beach.  May have twisted ankle in the water or walking on the beach.  Localizes the pain to the inside of ankle.  Describes the pain as intermittent, dull, and achy in character.  Has tried OTC medications without relief.  Symptoms are made worse with walking.  Denies similar symptoms in the past.  Reports swelling.  Denies fever, chills, erythema, ecchymosis, weakness, numbness and tingling.  ROS: As per HPI.  All other pertinent ROS negative.     Past Medical History:  Diagnosis Date   Arthritis    Asthma    triggered by weather changes; no inhaler   Cancer ([place])    Cervical   Collagen vascular disease ([place])    Fibromyalgia    Ganglion cyst of wrist 12/2012   left dorsal cyst   GERD (gastroesophageal reflux disease)    TUMS as needed   Headache(784.0)    tension/migraines   Hypercholesteremia    Hypertension    PONV (postoperative nausea and vomiting)    also states stopped breathing after hernia surgery   Rectal bleeding    Rheumatoid arthritis (HCC)    Stenosing tenosynovitis of finger 12/2012   left middle finger   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 [facility])    Past Surgical History:  Procedure Laterality Date   [place]   BIOPSY  12/01/2018   Procedure: BIOPSY;  Surgeon: Rogene Houston, MD;  [facility];  Service: Endoscopy;;  gastric    BIOPSY N/A 10/16/2019   Procedure: DUODENUM and JEJUNUM BIOPSY;  Surgeon: Rogene Houston, MD;  [facility];  Service: Endoscopy;  Laterality: N/A;   COLONOSCOPY N/A 05/08/2013   Procedure: COLONOSCOPY;  Surgeon: Rogene Houston, MD;  [facility];  Service: Endoscopy;  Laterality:  N/A;  240   COLONOSCOPY N/A 09/23/2015   Procedure: COLONOSCOPY;  Surgeon: Rogene Houston, MD;  [facility];  Service: Endoscopy;  Laterality: N/A;  825   COLONOSCOPY WITH PROPOFOL N/A 09/14/2020   Procedure: COLONOSCOPY WITH PROPOFOL;  Surgeon: Rogene Houston, MD;  [facility];  Service: Endoscopy;  Laterality: N/A;  [place] AND BIOPSY  04/01/2007   ECTOPIC PREGNANCY SURGERY  1995   left tube and ovary removed   ENTEROSCOPY N/A 10/16/2019   Procedure: PUSH ENTEROSCOPY;  Surgeon: Rogene Houston, MD;  [facility];  Service: Endoscopy;  Laterality: N/A;   ESOPHAGOGASTRODUODENOSCOPY N/A 12/01/2018   Procedure: ESOPHAGOGASTRODUODENOSCOPY (EGD);  Surgeon: Rogene Houston, MD;  [facility];  Service: Endoscopy;  Laterality: N/A;  12:45   ESOPHAGOGASTRODUODENOSCOPY (EGD) WITH PROPOFOL N/A 10/16/2019   Procedure: ESOPHAGOGASTRODUODENOSCOPY (EGD) WITH PROPOFOL;  Surgeon: Rogene Houston, MD;  [facility];  Service: Endoscopy;  Laterality: N/A;  1430pm   GANGLION CYST EXCISION  12/17/2012   Procedure: REMOVAL GANGLION OF WRIST;  Surgeon: Wynonia Sours, MD;  Location: [place];  Service: Orthopedics;  Laterality: Left;   HARDWARE REMOVAL Right 08/09/2014   Procedure: Placement Right Ankle Tight Rope, Removal Plate and Screws;  Surgeon: Marybelle Killings, MD;  [facility]  OR;  Service: Orthopedics;  Laterality: Right;   POLYPECTOMY  09/14/2020   Procedure: POLYPECTOMY;  Surgeon: Rogene Houston, MD;  [facility];  Service: Endoscopy;;   PUBOVAGINAL SLING  2010   rt ankle surgery     SALPINGOOPHORECTOMY  1996   right   TRIGGER FINGER RELEASE  12/17/2012   Procedure: RELEASE TRIGGER FINGER/A-1 PULLEY;  Surgeon: Wynonia Sours, MD;  Location: [place];  Service: Orthopedics;  Laterality: Left;   TUBAL LIGATION  1992   VAGINA RECONSTRUCTION SURGERY     x 5 since 2010   VENTRAL  HERNIA REPAIR  09/06/2006   Allergies  Allergen Reactions   Imitrex [Sumatriptan]     Causes a migraine   Cymbalta [Duloxetine Hcl] Rash   Latex Rash   Propoxyphene N-Acetaminophen Nausea And Vomiting and Rash   Tramadol Rash   No current facility-administered medications on file prior to encounter.   Current Outpatient Medications on File Prior to Encounter  Medication Sig Dispense Refill   albuterol (VENTOLIN HFA) 108 (90 Base) MCG/ACT inhaler Inhale 2 puffs into the lungs every 4 (four) hours as needed for wheezing or shortness of breath.      Cholecalciferol (VITAMIN D3) 125 MCG (5000 UT) CAPS Take 5,000 Units by mouth daily.      dicyclomine (BENTYL) 10 MG capsule TAKE 1 CAPSULE (10 MG TOTAL) BY MOUTH 3 (THREE) TIMES DAILY BEFORE MEALS. 270 capsule 5   estradiol (ESTRACE) 2 MG tablet Take 1 tablet (2 mg total) by mouth daily. 90 tablet 3   ferrous sulfate 325 (65 FE) MG tablet Take 325 mg by mouth as needed.     gabapentin (NEURONTIN) 400 MG capsule Take 1 capsule (400 mg total) by mouth at bedtime. 90 capsule 0   ibuprofen (ADVIL) 200 MG tablet Take 400-600 mg by mouth every 6 (six) hours as needed for moderate pain.     Iron Combinations (CHROMAGEN) capsule Take 1 capsule by mouth daily. 15 capsule 0   lisinopril (ZESTRIL) 5 MG tablet Take 1 tablet (5 mg total) by mouth at bedtime. 7 tablet 0   ondansetron (ZOFRAN-ODT) 8 MG disintegrating tablet DISSOLVE 1 TABLET ON THE TONGUE EVERY 8 HOURS AS NEEDED FOR NAUSEA AND VOMITING 12 tablet 4   pantoprazole (PROTONIX) 40 MG tablet Take 1 tablet (40 mg total) by mouth daily. 90 tablet 3   Social History   Socioeconomic History   Marital status: Married    Spouse name: Not on file   Number of children: 2   Years of education: Not on file   Highest education level: Not on file  Occupational History   Occupation: sales  Tobacco Use   Smoking status: Never   Smokeless tobacco: Never  Vaping Use   Vaping Use: Never used  Substance  and Sexual Activity   Alcohol use: No   Drug use: No   Sexual activity: Yes    Birth control/protection: Surgical    Comment: hyst tubal  Other Topics Concern   Not on file  Social History Narrative   Not on file   Social Determinants of Health   Financial Resource Strain: Low Risk    Difficulty of Paying Living Expenses: Not hard at all  Food Insecurity: No Food Insecurity   Worried About Charity fundraiser in the Last Year: Never true   [place] in the Last Year: Never true  Transportation Needs: No Transportation Needs   Lack of  Transportation (Medical): No   Lack of Transportation (Non-Medical): No  Physical Activity: Inactive   Days of Exercise per Week: 0 days   Minutes of Exercise per Session: 0 min  Stress: Stress Concern Present   Feeling of Stress : To some extent  Social Connections: Unknown   Frequency of Communication with Friends and Family: More than three times a week   Frequency of Social Gatherings with Friends and Family: Once a week   Attends Religious Services: Patient refused   Printmaker: Patient refused   Attends Music therapist: Patient refused   Marital Status: Married  Human resources officer Violence: Not At Risk   Fear of Current or Ex-Partner: No   Emotionally Abused: No   Physically Abused: No   Sexually Abused: No   Family History  Problem Relation Age of Onset   Lung cancer Mother    Cervical cancer Mother    Heart disease Father    Leukemia Father    Cancer Maternal Grandmother        bladder and colon cancer    OBJECTIVE:  Vitals:   06/08/21 1803  BP: (!) 150/80  Pulse: 84  Resp: 16  Temp: 98.4 F (36.9 C)  TempSrc: Oral  SpO2: 94%    General appearance: ALERT; in no acute distress.  Head: NCAT Lungs: Normal respiratory effort CV: Dorsalis pedis pulse 2+ Musculoskeletal: RT ankle Inspection: diffuse swelling Palpation: TTP over medial aspect ROM: LROM Strength:  deferred Skin: warm and dry Neurologic: Ambulates with minimal difficulty; Sensation intact about the lower extremities Psychological: alert and cooperative; normal mood and affect  DIAGNOSTIC STUDIES:  DG Ankle Complete Right  Result Date: 06/08/2021 CLINICAL DATA:  Pain, swelling EXAM: RIGHT ANKLE - COMPLETE 3+ VIEW COMPARISON:  02/04/2020 FINDINGS: Postoperative changes in the ankle region. Stable appearance. No acute bony abnormality. Specifically, no fracture, subluxation, or dislocation. Diffuse soft tissue swelling. IMPRESSION: Postoperative changes.  No acute bony abnormality. Electronically Signed   By: Rolm Baptise M.D.   On: 06/08/2021 19:02     X-ray negative ASSESSMENT & PLAN:  1. Acute right ankle pain   2. Sprain of right ankle, unspecified ligament, initial encounter      Meds ordered this encounter  Medications   predniSONE (DELTASONE) 20 MG tablet    Sig: Take 1 tablet (20 mg total) by mouth 2 (two) times daily with a meal for 5 days.    Dispense:  10 tablet    Refill:  0    Order Specific Question:   Supervising Provider    Answer:   Raylene Everts [[id number]]    X-ray negative for fracture, dislocation, or changes in hardware Continue conservative management of rest, ice, and elevation Ace applied Mobic prescribed.  Take as directed and to completion Follow up with orthopedist for further evaluation and management Return or go to the ER if you have any new or worsening symptoms (fever, chills, chest pain, redness, swelling, deformity, etc...)    Reviewed expectations re: course of current medical issues. Questions answered. Outlined signs and symptoms indicating need for more acute intervention. Patient verbalized understanding. After Visit Summary given.     Lestine Box, PA-C 06/08/21 1910

## 2021-06-08 NOTE — Discharge Instructions (Addendum)
X-ray negative for fracture, dislocation, or changes in hardware Continue conservative management of rest, ice, and elevation Ace applied Mobic prescribed.  Take as directed and to completion Follow up with orthopedist for further evaluation and management Return or go to the ER if you have any new or worsening symptoms (fever, chills, chest pain, redness, swelling, deformity, etc...)

## 2021-06-08 NOTE — ED Triage Notes (Signed)
Right foot swelling.  States she had surgery about 4 to 5 years ago on that foot.  No known injury.

## 2021-09-04 ENCOUNTER — Encounter: Payer: Self-pay | Admitting: Emergency Medicine

## 2021-09-04 ENCOUNTER — Other Ambulatory Visit: Payer: Self-pay

## 2021-09-04 ENCOUNTER — Encounter (HOSPITAL_COMMUNITY): Payer: Self-pay | Admitting: Emergency Medicine

## 2021-09-04 ENCOUNTER — Emergency Department (HOSPITAL_COMMUNITY)
Admission: EM | Admit: 2021-09-04 | Discharge: 2021-09-04 | Disposition: A | Discharge status: Home / Self Care | Payer: 59 | Attending: Emergency Medicine | Admitting: Emergency Medicine

## 2021-09-04 ENCOUNTER — Emergency Department (HOSPITAL_COMMUNITY): Payer: 59

## 2021-09-04 ENCOUNTER — Ambulatory Visit
Admission: EM | Admit: 2021-09-04 | Discharge: 2021-09-04 | Disposition: A | Discharge status: Home / Self Care | Payer: 59 | Attending: Family Medicine | Admitting: Family Medicine

## 2021-09-04 DIAGNOSIS — Z8541 Personal history of malignant neoplasm of cervix uteri: Secondary | ICD-10-CM | POA: Insufficient documentation

## 2021-09-04 DIAGNOSIS — R000 Tachycardia, unspecified: Secondary | ICD-10-CM | POA: Diagnosis not present

## 2021-09-04 DIAGNOSIS — J45909 Unspecified asthma, uncomplicated: Secondary | ICD-10-CM | POA: Diagnosis not present

## 2021-09-04 DIAGNOSIS — R0602 Shortness of breath: Secondary | ICD-10-CM | POA: Insufficient documentation

## 2021-09-04 DIAGNOSIS — R1110 Vomiting, unspecified: Secondary | ICD-10-CM | POA: Diagnosis not present

## 2021-09-04 DIAGNOSIS — R42 Dizziness and giddiness: Secondary | ICD-10-CM | POA: Insufficient documentation

## 2021-09-04 DIAGNOSIS — R079 Chest pain, unspecified: Secondary | ICD-10-CM | POA: Diagnosis present

## 2021-09-04 DIAGNOSIS — R002 Palpitations: Secondary | ICD-10-CM | POA: Diagnosis not present

## 2021-09-04 DIAGNOSIS — I10 Essential (primary) hypertension: Secondary | ICD-10-CM | POA: Diagnosis not present

## 2021-09-04 DIAGNOSIS — Z91040 Latex allergy status: Secondary | ICD-10-CM | POA: Diagnosis not present

## 2021-09-04 LAB — BASIC METABOLIC PANEL
Anion gap: 7 (ref 5–15)
BUN: 17 mg/dL (ref 6–20)
CO2: 26 mmol/L (ref 22–32)
Calcium: 8.8 mg/dL — ABNORMAL LOW (ref 8.9–10.3)
Chloride: 101 mmol/L (ref 98–111)
Creatinine, Ser: 0.84 mg/dL (ref 0.44–1.00)
GFR, Estimated: >60 mL/min (ref >60)
Glucose, Bld: 142 mg/dL — ABNORMAL HIGH (ref 70–99)
Potassium: 3.9 mmol/L (ref 3.5–5.1)
Sodium: 134 mmol/L — ABNORMAL LOW (ref 135–145)

## 2021-09-04 LAB — CBC
HCT: 36.7 % (ref 36.0–46.0)
Hemoglobin: 12.1 g/dL (ref 12.0–15.0)
MCH: 33.7 pg (ref 26.0–34.0)
MCHC: 33 g/dL (ref 30.0–36.0)
MCV: 102.2 fL — ABNORMAL HIGH (ref 80.0–100.0)
Platelets: 223 10*3/uL (ref 150–400)
RBC: 3.59 MIL/uL — ABNORMAL LOW (ref 3.87–5.11)
RDW: 12.7 % (ref 11.5–15.5)
WBC: 7.3 10*3/uL (ref 4.0–10.5)
nRBC: 0 % (ref 0.0–0.2)

## 2021-09-04 LAB — TROPONIN I (HIGH SENSITIVITY): Troponin I (High Sensitivity): <2 ng/L (ref <18)

## 2021-09-04 NOTE — ED Triage Notes (Signed)
Pt sent from UC for C/O chest pain. Pt reports chest pressure that started yesterday. Denies cardiac history.

## 2021-09-04 NOTE — ED Notes (Signed)
Pt also sts increased asthma sx x 1 week as well

## 2021-09-04 NOTE — ED Triage Notes (Signed)
Pt sts mid sternal CP intermittently x 1 week that she thinks is from anxiety with some nausea

## 2021-09-04 NOTE — Discharge Instructions (Addendum)
If you develop recurrent, continued, or worsening chest pain, shortness of breath, fever, vomiting, abdominal or back pain, or any other new/concerning symptoms then return to the ER for evaluation.  

## 2021-09-04 NOTE — ED Provider Notes (Signed)
[facility] EMERGENCY DEPARTMENT Provider Note   CSN: [id number] Arrival date & time: 09/04/21  5929     History Chief Complaint  Patient presents with   Chest Pain    [name] is a 55 y.o. female.  HPI 55 year old female presents with chest pain.  She has been feeling continuous chest pressure for about 1 week.  It is rated as about a 6 out of 10.  She has also been having intermittent symptoms of palpitations, tachycardia, lightheadedness and vomiting.  Somewhat short of breath as well.  These happen multiple times a day, lasting at 1 point up to 2 hours.  Nothing specific brings these on but she wonders if stress and anxiety are doing it as she has had anxiety attacks before and is similar and she has been under a lot of stress recently.  Most recent episode was around 5 AM.  She was hurting a little worse today which is why she went to urgent care and then sent here.  No leg swelling or recent travel or pain.  Nothing she does specifically set off the symptoms.  She has tried some Tylenol without relief.  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 [facility])     Patient Active Problem List   Diagnosis Date Noted   Vitamin B 12 deficiency 05/19/2021   Tired 04/14/2021   Anxiety and depression 02/17/2021   Encounter for screening fecal occult blood testing 02/17/2021   Encounter for well woman exam  with routine gynecological exam 02/17/2021   Current use of estrogen therapy 02/17/2021   Urine frequency 04/13/2020   Non-intractable vomiting 10/08/2019   CRP elevated 10/05/2019   Noninfectious jejunitis 06/29/2019   Anemia 06/29/2019   Anemia due to vitamin B12 deficiency    Nausea    Gastroesophageal reflux disease 10/23/2018   Upper abdominal pain 10/23/2018   Chest pain 10/16/2018   Stiffness of joint, not elsewhere classified, ankle and foot 09/07/2014   Pain in joint, ankle and foot 09/07/2014   Difficulty in walking(719.7) 09/07/2014   Right ankle instability 08/09/2014   Hematochezia 08/11/2013   Constipation 08/11/2013   RHEUMATOID ARTHRITIS 02/20/2010   KNEE PAIN 02/20/2010   FIBROMYALGIA 02/20/2010    Past Surgical History:  Procedure Laterality Date   ABDOMINAL HYSTERECTOMY  [place]   BIOPSY  12/01/2018   Procedure: BIOPSY;  Surgeon: Rogene Houston, MD;  [facility];  Service: Endoscopy;;  gastric    BIOPSY N/A 10/16/2019   Procedure: DUODENUM and JEJUNUM BIOPSY;  Surgeon: Rogene Houston, MD;  [facility];  Service: Endoscopy;  Laterality: N/A;   COLONOSCOPY N/A 05/08/2013   Procedure: COLONOSCOPY;  Surgeon: Rogene Houston, MD;  [facility];  Service: Endoscopy;  Laterality: N/A;  240   COLONOSCOPY N/A 09/23/2015   Procedure: COLONOSCOPY;  Surgeon: Rogene Houston, MD;  [facility];  Service: Endoscopy;  Laterality: N/A;  825   COLONOSCOPY WITH PROPOFOL N/A 09/14/2020   Procedure: COLONOSCOPY WITH PROPOFOL;  Surgeon: Rogene Houston, MD;  [facility];  Service: Endoscopy;  Laterality: N/A;  [place] AND BIOPSY  04/01/2007   ECTOPIC PREGNANCY SURGERY  1995   left tube and ovary removed   ENTEROSCOPY N/A 10/16/2019   Procedure: PUSH ENTEROSCOPY;  Surgeon: Rogene Houston, MD;  [facility];  Service: Endoscopy;  Laterality: N/A;    ESOPHAGOGASTRODUODENOSCOPY N/A 12/01/2018   Procedure: ESOPHAGOGASTRODUODENOSCOPY (EGD);  Surgeon: Rogene Houston, MD;  [facility];  Service: Endoscopy;  Laterality: N/A;  12:45   ESOPHAGOGASTRODUODENOSCOPY (EGD) WITH PROPOFOL N/A 10/16/2019   Procedure: ESOPHAGOGASTRODUODENOSCOPY (EGD) WITH PROPOFOL;  Surgeon: Rogene Houston, MD;  [facility];  Service: Endoscopy;  Laterality: N/A;  1430pm   GANGLION CYST EXCISION  12/17/2012   Procedure: REMOVAL GANGLION OF WRIST;  Surgeon: Wynonia Sours, MD;  Location: [place];  Service: Orthopedics;  Laterality: Left;   HARDWARE REMOVAL Right 08/09/2014   Procedure: Placement Right Ankle Tight Rope, Removal Plate and Screws;  Surgeon: Marybelle Killings, MD;  Location: [place];  Service: Orthopedics;  Laterality: Right;   POLYPECTOMY  09/14/2020   Procedure: POLYPECTOMY;  Surgeon: Rogene Houston, MD;  [facility];  Service: Endoscopy;;   PUBOVAGINAL SLING  2010   rt ankle surgery     SALPINGOOPHORECTOMY  1996   right   TRIGGER FINGER RELEASE  12/17/2012   Procedure: RELEASE TRIGGER FINGER/A-1 PULLEY;  Surgeon: Wynonia Sours, MD;  Location: [place];  Service: Orthopedics;  Laterality: Left;   TUBAL LIGATION  1992   VAGINA RECONSTRUCTION SURGERY     x 5 since 2010   [place]  09/06/2006     OB History     Gravida  3   Para  2   Term  2   Preterm  0   AB  1   Living  2      SAB      IAB      Ectopic  1   Multiple      Live Births  2           Family History  Problem Relation Age of Onset   Lung cancer Mother    Cervical cancer Mother    Heart disease Father    Leukemia Father    Cancer Maternal Grandmother        bladder and colon cancer    Social History   Tobacco Use   Smoking status: Never   Smokeless tobacco: Never  Vaping Use   Vaping Use: Never used  Substance Use Topics   Alcohol use: No   Drug use: No    Home Medications Prior  to Admission medications   Medication Sig Start Date End Date Taking? Authorizing Provider  albuterol (VENTOLIN HFA) 108 (90 Base) MCG/ACT inhaler Inhale 2 puffs into the lungs every 4 (four) hours as needed for wheezing or shortness of breath.  12/22/19   [provider]  Cholecalciferol (VITAMIN D3) 125 MCG (5000 UT) CAPS Take 5,000 Units by mouth daily.     [provider]  dicyclomine (BENTYL) 10 MG capsule TAKE 1  CAPSULE (10 MG TOTAL) BY MOUTH 3 (THREE) TIMES DAILY BEFORE MEALS. 02/22/21   Rehman, Mechele Dawley, MD  estradiol (ESTRACE) 2 MG tablet Take 1 tablet (2 mg total) by mouth daily. 02/17/21   Estill Dooms, NP  ferrous sulfate 325 (65 FE) MG tablet Take 325 mg by mouth as needed.    [provider]  gabapentin (NEURONTIN) 400 MG capsule Take 1 capsule (400 mg total) by mouth at bedtime. 12/25/16   Setzer, Rona Ravens, NP  ibuprofen (ADVIL) 200 MG tablet Take 400-600 mg by mouth every 6 (six) hours as needed for moderate pain.    [provider]  Iron Combinations (CHROMAGEN) capsule Take 1 capsule by mouth daily. 04/14/21 04/14/22  Estill Dooms, NP  lisinopril (ZESTRIL) 5 MG tablet Take 1 tablet (5 mg total) by mouth at bedtime. 03/27/21   Verta Ellen., NP  ondansetron (ZOFRAN-ODT) 8 MG disintegrating tablet DISSOLVE 1 TABLET ON THE TONGUE EVERY 8 HOURS AS NEEDED FOR NAUSEA AND VOMITING 07/25/20   Montez Morita, Quillian Quince, MD  pantoprazole (PROTONIX) 40 MG tablet Take 1 tablet (40 mg total) by mouth daily. 01/02/21   Rogene Houston, MD    Allergies    Imitrex [sumatriptan], Cymbalta [duloxetine hcl], Latex, Propoxyphene n-acetaminophen, and Tramadol  Review of Systems   Review of Systems  Constitutional:  Negative for fever.  Respiratory:  Positive for shortness of breath.   Cardiovascular:  Positive for chest pain and palpitations. Negative for leg swelling.  Gastrointestinal:  Positive for vomiting. Negative for abdominal pain.   Neurological:  Positive for light-headedness.  All other systems reviewed and are negative.  Physical Exam Updated Vital Signs BP 132/65   Pulse 86   Temp 98.2 F (36.8 C) (Oral)   Resp 17   Ht 5\' 3"  (1.6 m)   Wt 86 kg   SpO2 98%   BMI 33.59 kg/m   Physical Exam Vitals and nursing note reviewed.  Constitutional:      General: She is not in acute distress.    Appearance: She is well-developed. She is not ill-appearing or diaphoretic.  HENT:     Head: Normocephalic and atraumatic.     Right Ear: External ear normal.     Left Ear: External ear normal.     Nose: Nose normal.  Eyes:     General:        Right eye: No discharge.        Left eye: No discharge.  Cardiovascular:     Rate and Rhythm: Normal rate and regular rhythm.     Heart sounds: Normal heart sounds.  Pulmonary:     Effort: Pulmonary effort is normal.     Breath sounds: Normal breath sounds.  Abdominal:     Palpations: Abdomen is soft.     Tenderness: There is no abdominal tenderness.  Musculoskeletal:     Right lower leg: No tenderness. No edema.     Left lower leg: No tenderness. No edema.  Skin:    General: Skin is warm and dry.  Neurological:     Mental Status: She is alert.    ED Results / Procedures / Treatments   Labs (all labs ordered are listed, but only abnormal results are displayed) Labs Reviewed  BASIC METABOLIC PANEL - Abnormal; Notable for the following components:      Result Value   Sodium 134 (*)    Glucose, Bld 142 (*)    Calcium 8.8 (*)  All other components within normal limits  CBC - Abnormal; Notable for the following components:   RBC 3.59 (*)    MCV 102.2 (*)    All other components within normal limits  POC URINE PREG, ED  TROPONIN I (HIGH SENSITIVITY)  TROPONIN I (HIGH SENSITIVITY)    EKG EKG Interpretation  Date/Time:  Monday September 04 2021 09:23:35 EDT Ventricular Rate:  94 PR Interval:  158 QRS Duration: 78 QT Interval:  318 QTC  Calculation: 397 R Axis:   79 Text Interpretation: Normal sinus rhythm Nonspecific T wave abnormality Abnormal ECG Confirmed by Sherwood Gambler [phone]) on 09/04/2021 10:53:20 AM  Radiology DG Chest 2 View  Result Date: 09/04/2021 CLINICAL DATA:  Chest pain. EXAM: CHEST - 2 VIEW COMPARISON:  06/04/2019 FINDINGS: The heart size and mediastinal contours are within normal limits. Both lungs are clear. The visualized skeletal structures are unremarkable. Negative for a pneumothorax. Surgical clips in the right upper abdomen. IMPRESSION: No active cardiopulmonary disease. Electronically Signed   By: Markus Daft M.D.   On: 09/04/2021 09:51    Procedures Procedures   Medications Ordered in ED Medications - No data to display  ED Course  I have reviewed the triage vital signs and the nursing notes.  Pertinent labs & imaging results that were available during my care of the patient were reviewed by me and considered in my medical decision making (see chart for details).    MDM Rules/Calculators/A&P                           ECG is without acute ischemia.  Troponin is negative after about 5 hours of symptoms.  Discussed possible second troponin but she is declining.  At this point also discussed potential treatments like Vistaril for what could be anxiety.  Offered fluids, cardiac monitoring and watching in the ED for a couple hours.  However she is declining and wants to go home.  I offered a cardiology follow-up and she is declining.  My suspicion that she has ACS, PE, dissection is all pretty low.  I discussed that palpitations could be a symptom of anxiety but also could be a symptom of an arrhythmia that could cause similar symptoms.  She understands this and wants to follow-up with her PCP first and go from there.  She was given return precautions. Final Clinical Impression(s) / ED Diagnoses Final diagnoses:  Nonspecific chest pain  Palpitations    Rx / DC Orders ED Discharge Orders      None        Sherwood Gambler, MD 09/04/21 1117

## 2021-09-06 NOTE — ED Provider Notes (Signed)
[place]   [id number] 09/04/21 Arrival Time: 0815  ASSESSMENT & PLAN:  1. Chest pain, unspecified type    Discussed my limited resources here to work up chest pain. ECG without ischemic changes. Shows sinus rhythm. No STEMI. To ED via private vehicle. Declines EMS. Stable upon discharge.  Chest pain precautions given. Reviewed expectations re: course of current medical issues. Questions answered. Outlined signs and symptoms indicating need for more acute intervention. Patient verbalized understanding. After Visit Summary given.   SUBJECTIVE:  History from: patient. [name] is a 55 y.o. female who presents with complaint of intermittent non-radiating ant chest pain; on/off over past week; felt before coming here. Some assoc n without emesis. No specific assoc SOB. Ambulatory. No LE swelling.   Social History   Tobacco Use  Smoking Status Never  Smokeless Tobacco Never   Does feel anxious.    OBJECTIVE:  Vitals:   09/04/21 0831  BP: (!) 143/85  Pulse: 99  Resp: 18  Temp: 98.8 F (37.1 C)  TempSrc: Oral  SpO2: 95%    General appearance: alert, oriented, no acute distress Eyes: PERRLA; EOMI; conjunctivae normal HENT: normocephalic; atraumatic Neck: supple with FROM Lungs: without labored respirations; speaks full sentences without difficulty; CTAB Heart: regular  Chest Wall: without tenderness to palpation Abdomen: soft, non-tender; no guarding or rebound tenderness Extremities: without edema; without calf swelling or tenderness; symmetrical without gross deformities Skin: warm and dry; without rash or lesions Neuro: normal gait Psychological: alert and cooperative; normal mood and affect   Allergies  Allergen Reactions   Imitrex [Sumatriptan]     Causes a migraine   Cymbalta [Duloxetine Hcl] Rash   Latex Rash   Propoxyphene N-Acetaminophen Nausea And Vomiting and Rash   Tramadol Rash    Past Medical History:  Diagnosis Date    Arthritis    Asthma    triggered by weather changes; no inhaler   Cancer (HCC)    Cervical   Collagen vascular disease (HCC)    Fibromyalgia    Ganglion cyst of wrist 12/2012   left dorsal cyst   GERD (gastroesophageal reflux disease)    TUMS as needed   Headache(784.0)    tension/migraines   Hypercholesteremia    Hypertension    PONV (postoperative nausea and vomiting)    also states stopped breathing after hernia surgery   Rectal bleeding    Rheumatoid arthritis (HCC)    Stenosing tenosynovitis of finger 12/2012   left middle finger   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 [facility])    Social History   Socioeconomic History   Marital status: Married    Spouse name: Not on file   Number of children: 2   Years of education: Not on file   Highest education level: Not on file  Occupational History   Occupation: sales  Tobacco Use   Smoking status: Never   Smokeless tobacco: Never  Vaping Use   Vaping Use: Never used  Substance and Sexual Activity   Alcohol use: No   Drug use: No   Sexual activity: Yes    Birth control/protection: Surgical    Comment: hyst tubal  Other Topics Concern   Not on file  Social History Narrative   Not on file   Social Determinants of Health   Financial Resource Strain: Low Risk    Difficulty of Paying Living Expenses: Not hard at all  Food Insecurity: No Food Insecurity   Worried About Charity fundraiser in the  Last Year: Never true   Ran Out of Food in the Last Year: Never true  Transportation Needs: No Transportation Needs   Lack of Transportation (Medical): No   Lack of Transportation (Non-Medical): No  Physical Activity: Inactive   Days of Exercise per Week: 0 days   Minutes of Exercise per Session: 0 min  Stress: Stress Concern Present   Feeling of Stress : To some extent  Social Connections: Unknown   Frequency of Communication with Friends and Family: More than three times a week   Frequency of Social Gatherings with Friends  and Family: Once a week   Attends Religious Services: Patient refused   Marine scientist or Organizations: Patient refused   Attends Archivist Meetings: Patient refused   Marital Status: Married  Human resources officer Violence: Not At Risk   Fear of Current or Ex-Partner: No   Emotionally Abused: No   Physically Abused: No   Sexually Abused: No   Family History  Problem Relation Age of Onset   Lung cancer Mother    Cervical cancer Mother    Heart disease Father    Leukemia Father    Cancer Maternal Grandmother        bladder and colon cancer   Past Surgical History:  Procedure Laterality Date   ABDOMINAL HYSTERECTOMY  [place]   BIOPSY  12/01/2018   Procedure: BIOPSY;  Surgeon: Rogene Houston, MD;  [facility];  Service: Endoscopy;;  gastric    BIOPSY N/A 10/16/2019   Procedure: DUODENUM and JEJUNUM BIOPSY;  Surgeon: Rogene Houston, MD;  [facility];  Service: Endoscopy;  Laterality: N/A;   COLONOSCOPY N/A 05/08/2013   Procedure: COLONOSCOPY;  Surgeon: Rogene Houston, MD;  [facility];  Service: Endoscopy;  Laterality: N/A;  240   COLONOSCOPY N/A 09/23/2015   Procedure: COLONOSCOPY;  Surgeon: Rogene Houston, MD;  [facility];  Service: Endoscopy;  Laterality: N/A;  825   COLONOSCOPY WITH PROPOFOL N/A 09/14/2020   Procedure: COLONOSCOPY WITH PROPOFOL;  Surgeon: Rogene Houston, MD;  [facility];  Service: Endoscopy;  Laterality: N/A;  [place] AND BIOPSY  04/01/2007   ECTOPIC PREGNANCY SURGERY  1995   left tube and ovary removed   ENTEROSCOPY N/A 10/16/2019   Procedure: PUSH ENTEROSCOPY;  Surgeon: Rogene Houston, MD;  [facility];  Service: Endoscopy;  Laterality: N/A;   ESOPHAGOGASTRODUODENOSCOPY N/A 12/01/2018   Procedure: ESOPHAGOGASTRODUODENOSCOPY (EGD);  Surgeon: Rogene Houston, MD;  [facility];  Service: Endoscopy;   Laterality: N/A;  12:45   ESOPHAGOGASTRODUODENOSCOPY (EGD) WITH PROPOFOL N/A 10/16/2019   Procedure: ESOPHAGOGASTRODUODENOSCOPY (EGD) WITH PROPOFOL;  Surgeon: Rogene Houston, MD;  [facility];  Service: Endoscopy;  Laterality: N/A;  1430pm   GANGLION CYST EXCISION  12/17/2012   Procedure: REMOVAL GANGLION OF WRIST;  Surgeon: Wynonia Sours, MD;  Location: [place];  Service: Orthopedics;  Laterality: Left;   HARDWARE REMOVAL Right 08/09/2014   Procedure: Placement Right Ankle Tight Rope, Removal Plate and Screws;  Surgeon: Marybelle Killings, MD;  Location: [place];  Service: Orthopedics;  Laterality: Right;   POLYPECTOMY  09/14/2020   Procedure: POLYPECTOMY;  Surgeon: Rogene Houston, MD;  [facility];  Service: Endoscopy;;   PUBOVAGINAL SLING  2010   rt ankle surgery     SALPINGOOPHORECTOMY  1996  right   TRIGGER FINGER RELEASE  12/17/2012   Procedure: RELEASE TRIGGER FINGER/A-1 PULLEY;  Surgeon: Wynonia Sours, MD;  Location: [place];  Service: Orthopedics;  Laterality: Left;   TUBAL LIGATION  1992   VAGINA RECONSTRUCTION SURGERY     x 5 since 2010   [place]  09/06/2006      Vanessa Kick, MD 09/06/21 [id number]

## 2021-11-20 ENCOUNTER — Ambulatory Visit
Admission: EM | Admit: 2021-11-20 | Discharge: 2021-11-20 | Disposition: A | Discharge status: Home / Self Care | Payer: 59 | Attending: Family Medicine | Admitting: Family Medicine

## 2021-11-20 ENCOUNTER — Other Ambulatory Visit: Payer: Self-pay

## 2021-11-20 DIAGNOSIS — R112 Nausea with vomiting, unspecified: Secondary | ICD-10-CM

## 2021-11-20 DIAGNOSIS — R000 Tachycardia, unspecified: Secondary | ICD-10-CM

## 2021-11-20 DIAGNOSIS — Z1152 Encounter for screening for COVID-19: Secondary | ICD-10-CM

## 2021-11-20 DIAGNOSIS — J069 Acute upper respiratory infection, unspecified: Secondary | ICD-10-CM

## 2021-11-20 DIAGNOSIS — R509 Fever, unspecified: Secondary | ICD-10-CM | POA: Diagnosis not present

## 2021-11-20 MED ORDER — LIDOCAINE VISCOUS HCL 2 % MT SOLN: 5.0000 mL | OROMUCOSAL | 0 refills | Status: DC | PRN

## 2021-11-20 MED ORDER — OSELTAMIVIR PHOSPHATE 75 MG PO CAPS: 75.0000 mg | ORAL_CAPSULE | Freq: Two times a day (BID) | ORAL | 0 refills | Status: DC

## 2021-11-20 NOTE — ED Triage Notes (Signed)
Pt presents with fever with abdominal pain and vomiting that began yesterday

## 2021-11-20 NOTE — ED Provider Notes (Signed)
[facility]    CSN: [id number] Arrival date & time: 11/20/21  0820      History   Chief Complaint Chief Complaint  Patient presents with   Fever   Emesis    HPI [name] is a 55 y.o. female.   Presenting today with 1 day history of fever, upper abdominal pain, nausea, vomiting, cough, sore throat, congestion.  Also having some fatigue, body aches, weakness.  Denies chest pain, shortness of breath, complete intolerance to p.o., dizziness, syncope.  No known sick contacts recently.  So far trying Tylenol with minimal relief.   Past Medical History:  Diagnosis Date   Arthritis    Asthma    triggered by weather changes; no inhaler   Cancer ([place])    Cervical   Collagen vascular disease ([place])    Fibromyalgia    Ganglion cyst of wrist 12/2012   left dorsal cyst   GERD (gastroesophageal reflux disease)    TUMS as needed   Headache(784.0)    tension/migraines   Hypercholesteremia    Hypertension    PONV (postoperative nausea and vomiting)    also states stopped breathing after hernia surgery   Rectal bleeding    Rheumatoid arthritis (HCC)    Stenosing tenosynovitis of finger 12/2012   left middle finger   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 [facility])     Patient Active Problem List   Diagnosis Date Noted   Vitamin B 12 deficiency 05/19/2021   Tired 04/14/2021   Anxiety and depression 02/17/2021   Encounter for screening fecal occult blood testing 02/17/2021   Encounter for well woman exam with routine gynecological exam 02/17/2021   Current use of estrogen therapy 02/17/2021   Urine frequency 04/13/2020   Non-intractable vomiting 10/08/2019   CRP elevated 10/05/2019   Noninfectious jejunitis 06/29/2019   Anemia 06/29/2019   Anemia due to vitamin B12 deficiency    Nausea    Gastroesophageal reflux disease 10/23/2018   Upper abdominal pain 10/23/2018   Chest pain 10/16/2018   Stiffness of joint, not elsewhere classified, ankle and foot 09/07/2014    Pain in joint, ankle and foot 09/07/2014   Difficulty in walking(719.7) 09/07/2014   Right ankle instability 08/09/2014   Hematochezia 08/11/2013   Constipation 08/11/2013   RHEUMATOID ARTHRITIS 02/20/2010   KNEE PAIN 02/20/2010   FIBROMYALGIA 02/20/2010    Past Surgical History:  Procedure Laterality Date   ABDOMINAL HYSTERECTOMY  [place]   BIOPSY  12/01/2018   Procedure: BIOPSY;  Surgeon: Rogene Houston, MD;  [facility];  Service: Endoscopy;;  gastric    BIOPSY N/A 10/16/2019   Procedure: DUODENUM and JEJUNUM BIOPSY;  Surgeon: Rogene Houston, MD;  [facility];  Service: Endoscopy;  Laterality: N/A;   COLONOSCOPY N/A 05/08/2013   Procedure: COLONOSCOPY;  Surgeon: Rogene Houston, MD;  [facility];  Service: Endoscopy;  Laterality: N/A;  240   COLONOSCOPY N/A 09/23/2015   Procedure: COLONOSCOPY;  Surgeon: Rogene Houston, MD;  [facility];  Service: Endoscopy;  Laterality: N/A;  825   COLONOSCOPY WITH PROPOFOL N/A 09/14/2020   Procedure: COLONOSCOPY WITH PROPOFOL;  Surgeon: Rogene Houston, MD;  [facility];  Service: Endoscopy;  Laterality: N/A;  [place] AND BIOPSY  04/01/2007   ECTOPIC PREGNANCY SURGERY  1995   left tube and ovary removed   ENTEROSCOPY N/A 10/16/2019   Procedure: PUSH  ENTEROSCOPY;  Surgeon: Rogene Houston, MD;  [facility];  Service: Endoscopy;  Laterality: N/A;   ESOPHAGOGASTRODUODENOSCOPY N/A 12/01/2018   Procedure: ESOPHAGOGASTRODUODENOSCOPY (EGD);  Surgeon: Rogene Houston, MD;  [facility];  Service: Endoscopy;  Laterality: N/A;  12:45   ESOPHAGOGASTRODUODENOSCOPY (EGD) WITH PROPOFOL N/A 10/16/2019   Procedure: ESOPHAGOGASTRODUODENOSCOPY (EGD) WITH PROPOFOL;  Surgeon: Rogene Houston, MD;  [facility];  Service: Endoscopy;  Laterality: N/A;  1430pm   GANGLION CYST EXCISION  12/17/2012   Procedure: REMOVAL  GANGLION OF WRIST;  Surgeon: Wynonia Sours, MD;  Location: [place];  Service: Orthopedics;  Laterality: Left;   HARDWARE REMOVAL Right 08/09/2014   Procedure: Placement Right Ankle Tight Rope, Removal Plate and Screws;  Surgeon: Marybelle Killings, MD;  Location: [place];  Service: Orthopedics;  Laterality: Right;   POLYPECTOMY  09/14/2020   Procedure: POLYPECTOMY;  Surgeon: Rogene Houston, MD;  [facility];  Service: Endoscopy;;   PUBOVAGINAL SLING  2010   rt ankle surgery     SALPINGOOPHORECTOMY  1996   right   TRIGGER FINGER RELEASE  12/17/2012   Procedure: RELEASE TRIGGER FINGER/A-1 PULLEY;  Surgeon: Wynonia Sours, MD;  Location: [place];  Service: Orthopedics;  Laterality: Left;   TUBAL LIGATION  1992   VAGINA RECONSTRUCTION SURGERY     x 5 since 2010   [place]  09/06/2006    OB History     Gravida  3   Para  2   Term  2   Preterm  0   AB  1   Living  2      SAB      IAB      Ectopic  1   Multiple      Live Births  2          Home Medications    Prior to Admission medications   Medication Sig Start Date End Date Taking? Authorizing Provider  lidocaine (XYLOCAINE) 2 % solution Use as directed 5 mLs in the mouth or throat as needed for mouth pain. 11/20/21  Yes Volney American, PA-C  oseltamivir (TAMIFLU) 75 MG capsule Take 1 capsule (75 mg total) by mouth every 12 (twelve) hours. 11/20/21  Yes Volney American, PA-C  albuterol (VENTOLIN HFA) 108 (90 Base) MCG/ACT inhaler Inhale 2 puffs into the lungs every 4 (four) hours as needed for wheezing or shortness of breath.  12/22/19   [provider]  Cholecalciferol (VITAMIN D3) 125 MCG (5000 UT) CAPS Take 5,000 Units by mouth daily.     [provider]  dicyclomine (BENTYL) 10 MG capsule TAKE 1 CAPSULE (10 MG TOTAL) BY MOUTH 3 (THREE) TIMES DAILY BEFORE MEALS. 02/22/21   Rehman, Mechele Dawley, MD  estradiol (ESTRACE) 2 MG tablet Take 1  tablet (2 mg total) by mouth daily. 02/17/21   Estill Dooms, NP  ferrous sulfate 325 (65 FE) MG tablet Take 325 mg by mouth as needed.    [provider]  gabapentin (NEURONTIN) 400 MG capsule Take 1 capsule (400 mg total) by mouth at bedtime. 12/25/16   Setzer, Rona Ravens, NP  ibuprofen (ADVIL) 200 MG tablet Take 400-600 mg by mouth every 6 (six) hours as needed for moderate pain.    [provider]  Iron Combinations (CHROMAGEN) capsule Take 1 capsule by mouth daily. 04/14/21 04/14/22  Estill Dooms, NP  lisinopril (ZESTRIL) 5 MG  tablet Take 1 tablet (5 mg total) by mouth at bedtime. 03/27/21   Verta Ellen., NP  ondansetron (ZOFRAN-ODT) 8 MG disintegrating tablet DISSOLVE 1 TABLET ON THE TONGUE EVERY 8 HOURS AS NEEDED FOR NAUSEA AND VOMITING 07/25/20   Montez Morita, Quillian Quince, MD  pantoprazole (PROTONIX) 40 MG tablet Take 1 tablet (40 mg total) by mouth daily. 01/02/21   Rogene Houston, MD    Family History Family History  Problem Relation Age of Onset   Lung cancer Mother    Cervical cancer Mother    Heart disease Father    Leukemia Father    Cancer Maternal Grandmother        bladder and colon cancer    Social History Social History   Tobacco Use   Smoking status: Never   Smokeless tobacco: Never  Vaping Use   Vaping Use: Never used  Substance Use Topics   Alcohol use: No   Drug use: No     Allergies   Imitrex [sumatriptan], Cymbalta [duloxetine hcl], Latex, Propoxyphene n-acetaminophen, and Tramadol   Review of Systems Review of Systems PER HPI  Physical Exam Triage Vital Signs ED Triage Vitals  Enc Vitals Group     BP 11/20/21 0856 130/87     Pulse Rate 11/20/21 0856 (!) 114     Resp 11/20/21 0856 20     Temp 11/20/21 0856 (!) 101 F (38.3 C)     Temp src --      SpO2 11/20/21 0856 93 %     Weight --      Height --      Head Circumference --      Peak Flow --      Pain Score 11/20/21 0855 7     Pain Loc --      Pain Edu?  --      Excl. in [place]? --    No data found.  Updated Vital Signs BP 130/87   Pulse (!) 114   Temp (!) 101 F (38.3 C)   Resp 20   SpO2 93%   Visual Acuity Right Eye Distance:   Left Eye Distance:   Bilateral Distance:    Right Eye Near:   Left Eye Near:    Bilateral Near:     Physical Exam Vitals and nursing note reviewed.  Constitutional:      Appearance: Normal appearance.  HENT:     Head: Atraumatic.     Right Ear: Tympanic membrane and external ear normal.     Left Ear: Tympanic membrane and external ear normal.     Nose: Congestion present.     Mouth/Throat:     Mouth: Mucous membranes are moist.     Pharynx: Posterior oropharyngeal erythema present.  Eyes:     Extraocular Movements: Extraocular movements intact.     Conjunctiva/sclera: Conjunctivae normal.  Cardiovascular:     Rate and Rhythm: Normal rate and regular rhythm.     Heart sounds: Normal heart sounds.  Pulmonary:     Effort: Pulmonary effort is normal.     Breath sounds: Normal breath sounds. No wheezing.  Abdominal:     General: Bowel sounds are normal. There is no distension.     Palpations: Abdomen is soft.     Tenderness: There is abdominal tenderness. There is guarding. There is no right CVA tenderness, left CVA tenderness or rebound.  Musculoskeletal:        General: Normal range of motion.  Cervical back: Normal range of motion and neck supple.  Skin:    General: Skin is warm and dry.  Neurological:     Mental Status: She is alert and oriented to person, place, and time.     Motor: No weakness.     Gait: Gait normal.  Psychiatric:        Mood and Affect: Mood normal.        Thought Content: Thought content normal.   UC Treatments / Results  Labs (all labs ordered are listed, but only abnormal results are displayed) Labs Reviewed  COVID-19, FLU A+B NAA    EKG  Radiology No results found.  Procedures Procedures (including critical care time)  Medications Ordered in  UC Medications - No data to display  Initial Impression / Assessment and Plan / UC Course  I have reviewed the triage vital signs and the nursing notes.  Pertinent labs & imaging results that were available during my care of the patient were reviewed by me and considered in my medical decision making (see chart for details).     Febrile and tachycardic in triage, actively nauseated so given ibuprofen, Zofran with mild benefit.  Suspect influenza-like illness, will treat with Tamiflu while awaiting COVID, flu results.  Has Zofran at home so take this as needed, brat diet, push fluids, start Tamiflu and viscous lidocaine for symptomatic benefit.  Discussed supportive home care and return precautions.  Work note given.  Final Clinical Impressions(s) / UC Diagnoses   Final diagnoses:  Viral URI  Fever, unspecified  Nausea and vomiting, unspecified vomiting type  Tachycardia   Discharge Instructions   None    ED Prescriptions     Medication Sig Dispense Auth. Provider   oseltamivir (TAMIFLU) 75 MG capsule Take 1 capsule (75 mg total) by mouth every 12 (twelve) hours. 10 capsule Volney American, PA-C   lidocaine (XYLOCAINE) 2 % solution Use as directed 5 mLs in the mouth or throat as needed for mouth pain. 100 mL Volney American, [place]      PDMP not reviewed this encounter.   Merrie Roof [place], [place] 11/20/21 [phone]

## 2021-11-21 LAB — COVID-19, FLU A+B NAA
Influenza A, NAA: NOT DETECTED
Influenza B, NAA: NOT DETECTED
SARS-CoV-2, NAA: NOT DETECTED

## 2021-12-04 ENCOUNTER — Ambulatory Visit
Admission: EM | Admit: 2021-12-04 | Discharge: 2021-12-04 | Disposition: A | Discharge status: Home / Self Care | Payer: 59 | Attending: Family Medicine | Admitting: Family Medicine

## 2021-12-04 ENCOUNTER — Other Ambulatory Visit: Payer: Self-pay

## 2021-12-04 DIAGNOSIS — J069 Acute upper respiratory infection, unspecified: Secondary | ICD-10-CM | POA: Diagnosis not present

## 2021-12-04 MED ORDER — PREDNISONE 20 MG PO TABS: 40.0000 mg | ORAL_TABLET | Freq: Every day | ORAL | 0 refills | Status: DC

## 2021-12-04 MED ORDER — HYDROCOD POLST-CPM POLST ER 10-8 MG/5ML PO SUER: 5.0000 mL | Freq: Two times a day (BID) | ORAL | 0 refills | Status: DC | PRN

## 2021-12-04 NOTE — ED Triage Notes (Signed)
Pt presents with productive cough and reports fever developed a week ago, declines covid test

## 2021-12-08 NOTE — ED Provider Notes (Signed)
[facility]    CSN: [id number] Arrival date & time: 12/04/21  0820      History   Chief Complaint Chief Complaint  Patient presents with   Cough    HPI [name] is a 55 y.o. female.   Presenting today with 1 week of productive cough, fever, fatigue, congestion. Denies CP, SOB, abdominal pain, N/V/D. So far trying OTC cold and congestion medication with no relief. No recent sick contacts.    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 [facility])     Patient Active Problem List   Diagnosis Date Noted   Vitamin B 12 deficiency 05/19/2021   Tired 04/14/2021   Anxiety and depression 02/17/2021   Encounter for screening fecal occult blood testing 02/17/2021   Encounter for well woman exam with routine gynecological exam 02/17/2021   Current use of estrogen therapy 02/17/2021   Urine frequency 04/13/2020   Non-intractable vomiting 10/08/2019   CRP elevated 10/05/2019   Noninfectious jejunitis 06/29/2019   Anemia 06/29/2019   Anemia due to vitamin B12 deficiency    Nausea    Gastroesophageal reflux disease 10/23/2018   Upper abdominal pain 10/23/2018   Chest pain 10/16/2018   Stiffness of joint, not elsewhere classified, ankle and foot 09/07/2014   Pain in joint, ankle and foot 09/07/2014   Difficulty in walking(719.7) 09/07/2014   Right ankle instability 08/09/2014    Hematochezia 08/11/2013   Constipation 08/11/2013   RHEUMATOID ARTHRITIS 02/20/2010   KNEE PAIN 02/20/2010   FIBROMYALGIA 02/20/2010    Past Surgical History:  Procedure Laterality Date   ABDOMINAL HYSTERECTOMY  [place]   BIOPSY  12/01/2018   Procedure: BIOPSY;  Surgeon: Rogene Houston, MD;  [facility];  Service: Endoscopy;;  gastric    BIOPSY N/A 10/16/2019   Procedure: DUODENUM and JEJUNUM BIOPSY;  Surgeon: Rogene Houston, MD;  [facility];  Service: Endoscopy;  Laterality: N/A;   COLONOSCOPY N/A 05/08/2013   Procedure: COLONOSCOPY;  Surgeon: Rogene Houston, MD;  [facility];  Service: Endoscopy;  Laterality: N/A;  240   COLONOSCOPY N/A 09/23/2015   Procedure: COLONOSCOPY;  Surgeon: Rogene Houston, MD;  [facility];  Service: Endoscopy;  Laterality: N/A;  825   COLONOSCOPY WITH PROPOFOL N/A 09/14/2020   Procedure: COLONOSCOPY WITH PROPOFOL;  Surgeon: Rogene Houston, MD;  [facility];  Service: Endoscopy;  Laterality: N/A;  [place] AND BIOPSY  04/01/2007   ECTOPIC PREGNANCY SURGERY  1995   left tube and ovary removed   ENTEROSCOPY N/A 10/16/2019   Procedure: PUSH ENTEROSCOPY;  Surgeon: Rogene Houston, MD;  [facility];  Service: Endoscopy;  Laterality: N/A;   ESOPHAGOGASTRODUODENOSCOPY N/A  12/01/2018   Procedure: ESOPHAGOGASTRODUODENOSCOPY (EGD);  Surgeon: Rogene Houston, MD;  [facility];  Service: Endoscopy;  Laterality: N/A;  12:45   ESOPHAGOGASTRODUODENOSCOPY (EGD) WITH PROPOFOL N/A 10/16/2019   Procedure: ESOPHAGOGASTRODUODENOSCOPY (EGD) WITH PROPOFOL;  Surgeon: Rogene Houston, MD;  [facility];  Service: Endoscopy;  Laterality: N/A;  1430pm   GANGLION CYST EXCISION  12/17/2012   Procedure: REMOVAL GANGLION OF WRIST;  Surgeon: Wynonia Sours, MD;  Location: [place];  Service: Orthopedics;  Laterality: Left;    HARDWARE REMOVAL Right 08/09/2014   Procedure: Placement Right Ankle Tight Rope, Removal Plate and Screws;  Surgeon: Marybelle Killings, MD;  Location: [place];  Service: Orthopedics;  Laterality: Right;   POLYPECTOMY  09/14/2020   Procedure: POLYPECTOMY;  Surgeon: Rogene Houston, MD;  [facility];  Service: Endoscopy;;   PUBOVAGINAL SLING  2010   rt ankle surgery     SALPINGOOPHORECTOMY  1996   right   TRIGGER FINGER RELEASE  12/17/2012   Procedure: RELEASE TRIGGER FINGER/A-1 PULLEY;  Surgeon: Wynonia Sours, MD;  Location: [place];  Service: Orthopedics;  Laterality: Left;   TUBAL LIGATION  1992   VAGINA RECONSTRUCTION SURGERY     x 5 since 2010   [place]  09/06/2006    OB History     Gravida  3   Para  2   Term  2   Preterm  0   AB  1   Living  2      SAB      IAB      Ectopic  1   Multiple      Live Births  2            Home Medications    Prior to Admission medications   Medication Sig Start Date End Date Taking? Authorizing Provider  chlorpheniramine-HYDROcodone (TUSSIONEX PENNKINETIC ER) 10-8 MG/5ML SUER Take 5 mLs by mouth every 12 (twelve) hours as needed for cough. 12/04/21  Yes Volney American, PA-C  predniSONE (DELTASONE) 20 MG tablet Take 2 tablets (40 mg total) by mouth daily with breakfast. 12/04/21  Yes Volney American, PA-C  albuterol (VENTOLIN HFA) 108 (90 Base) MCG/ACT inhaler Inhale 2 puffs into the lungs every 4 (four) hours as needed for wheezing or shortness of breath.  12/22/19   [provider]  Cholecalciferol (VITAMIN D3) 125 MCG (5000 UT) CAPS Take 5,000 Units by mouth daily.     [provider]  dicyclomine (BENTYL) 10 MG capsule TAKE 1 CAPSULE (10 MG TOTAL) BY MOUTH 3 (THREE) TIMES DAILY BEFORE MEALS. 02/22/21   Rehman, Mechele Dawley, MD  estradiol (ESTRACE) 2 MG tablet Take 1 tablet (2 mg total) by mouth daily. 02/17/21   Estill Dooms, NP  ferrous sulfate 325 (65 FE)  MG tablet Take 325 mg by mouth as needed.    [provider]  gabapentin (NEURONTIN) 400 MG capsule Take 1 capsule (400 mg total) by mouth at bedtime. 12/25/16   Setzer, Rona Ravens, NP  ibuprofen (ADVIL) 200 MG tablet Take 400-600 mg by mouth every 6 (six) hours as needed for moderate pain.    [provider]  Iron Combinations (CHROMAGEN) capsule Take 1 capsule by mouth daily. 04/14/21 04/14/22  Estill Dooms, NP  lidocaine (XYLOCAINE) 2 % solution Use as directed 5 mLs in the mouth or throat as needed for mouth pain. 11/20/21   Merrie Roof  Elizabeth, PA-C  lisinopril (ZESTRIL) 5 MG tablet Take 1 tablet (5 mg total) by mouth at bedtime. 03/27/21   Verta Ellen., NP  ondansetron (ZOFRAN-ODT) 8 MG disintegrating tablet DISSOLVE 1 TABLET ON THE TONGUE EVERY 8 HOURS AS NEEDED FOR NAUSEA AND VOMITING 07/25/20   Montez Morita, Quillian Quince, MD  oseltamivir (TAMIFLU) 75 MG capsule Take 1 capsule (75 mg total) by mouth every 12 (twelve) hours. 11/20/21   Volney American, PA-C  pantoprazole (PROTONIX) 40 MG tablet Take 1 tablet (40 mg total) by mouth daily. 01/02/21   Rogene Houston, MD    Family History Family History  Problem Relation Age of Onset   Lung cancer Mother    Cervical cancer Mother    Heart disease Father    Leukemia Father    Cancer Maternal Grandmother        bladder and colon cancer    Social History Social History   Tobacco Use   Smoking status: Never   Smokeless tobacco: Never  Vaping Use   Vaping Use: Never used  Substance Use Topics   Alcohol use: No   Drug use: No     Allergies   Imitrex [sumatriptan], Cymbalta [duloxetine hcl], Latex, Propoxyphene n-acetaminophen, and Tramadol   Review of Systems Review of Systems PER HPI   Physical Exam Triage Vital Signs ED Triage Vitals  Enc Vitals Group     BP 12/04/21 0853 (!) 144/92     Pulse Rate 12/04/21 0853 86     Resp 12/04/21 0853 20     Temp 12/04/21 0853 98.3 F (36.8 C)      Temp src --      SpO2 12/04/21 0853 95 %     Weight --      Height --      Head Circumference --      Peak Flow --      Pain Score 12/04/21 0852 6     Pain Loc --      Pain Edu? --      Excl. in [place]? --    No data found.  Updated Vital Signs BP (!) 144/92    Pulse 86    Temp 98.3 F (36.8 C)    Resp 20    SpO2 95%   Visual Acuity Right Eye Distance:   Left Eye Distance:   Bilateral Distance:    Right Eye Near:   Left Eye Near:    Bilateral Near:     Physical Exam Vitals and nursing note reviewed.  Constitutional:      Appearance: Normal appearance.  HENT:     Head: Atraumatic.     Right Ear: Tympanic membrane and external ear normal.     Left Ear: Tympanic membrane and external ear normal.     Nose: Rhinorrhea present.     Mouth/Throat:     Mouth: Mucous membranes are moist.     Pharynx: Posterior oropharyngeal erythema present.  Eyes:     Extraocular Movements: Extraocular movements intact.     Conjunctiva/sclera: Conjunctivae normal.  Cardiovascular:     Rate and Rhythm: Normal rate and regular rhythm.     Heart sounds: Normal heart sounds.  Pulmonary:     Effort: Pulmonary effort is normal.     Breath sounds: Normal breath sounds. No wheezing or rales.  Musculoskeletal:        General: Normal range of motion.     Cervical back: Normal range of motion and neck supple.  Skin:    General: Skin is warm and dry.  Neurological:     Mental Status: She is alert and oriented to person, place, and time.  Psychiatric:        Mood and Affect: Mood normal.        Thought Content: Thought content normal.     UC Treatments / Results  Labs (all labs ordered are listed, but only abnormal results are displayed) Labs Reviewed - No data to display  EKG   Radiology No results found.  Procedures Procedures (including critical care time)  Medications Ordered in UC Medications - No data to display  Initial Impression / Assessment and Plan / UC Course  I have  reviewed the triage vital signs and the nursing notes.  Pertinent labs & imaging results that were available during my care of the patient were reviewed by me and considered in my medical decision making (see chart for details).     Vitals and exam overall reassuring, declines viral testing. Treat with prednisone, phenergan DM, supportive care. Return for acutely worsening sxs.   Final Clinical Impressions(s) / UC Diagnoses   Final diagnoses:  Viral URI with cough   Discharge Instructions   None    ED Prescriptions     Medication Sig Dispense Auth. Provider   predniSONE (DELTASONE) 20 MG tablet Take 2 tablets (40 mg total) by mouth daily with breakfast. 10 tablet Volney American, PA-C   chlorpheniramine-HYDROcodone [facility]) 10-8 MG/5ML SUER Take 5 mLs by mouth every 12 (twelve) hours as needed for cough. 100 mL Volney American, [place]      PDMP not reviewed this encounter.   Volney American, [place] 12/08/21 2343

## 2021-12-11 ENCOUNTER — Emergency Department (HOSPITAL_COMMUNITY)
Admission: EM | Admit: 2021-12-11 | Discharge: 2021-12-11 | Disposition: A | Discharge status: Home / Self Care | Payer: 59 | Attending: Emergency Medicine | Admitting: Emergency Medicine

## 2021-12-11 ENCOUNTER — Encounter (HOSPITAL_COMMUNITY): Payer: Self-pay | Admitting: *Deleted

## 2021-12-11 ENCOUNTER — Emergency Department (HOSPITAL_COMMUNITY): Payer: 59

## 2021-12-11 DIAGNOSIS — Z91040 Latex allergy status: Secondary | ICD-10-CM | POA: Insufficient documentation

## 2021-12-11 DIAGNOSIS — R197 Diarrhea, unspecified: Secondary | ICD-10-CM | POA: Insufficient documentation

## 2021-12-11 DIAGNOSIS — R112 Nausea with vomiting, unspecified: Secondary | ICD-10-CM | POA: Insufficient documentation

## 2021-12-11 DIAGNOSIS — R1010 Upper abdominal pain, unspecified: Secondary | ICD-10-CM

## 2021-12-11 LAB — COMPREHENSIVE METABOLIC PANEL
ALT: 32 U/L (ref 0–44)
AST: 28 U/L (ref 15–41)
Albumin: 3.8 g/dL (ref 3.5–5.0)
Alkaline Phosphatase: 50 U/L (ref 38–126)
Anion gap: 10 (ref 5–15)
BUN: 18 mg/dL (ref 6–20)
CO2: 28 mmol/L (ref 22–32)
Calcium: 9.3 mg/dL (ref 8.9–10.3)
Chloride: 100 mmol/L (ref 98–111)
Creatinine, Ser: 0.96 mg/dL (ref 0.44–1.00)
GFR, Estimated: >60 mL/min (ref >60)
Glucose, Bld: 131 mg/dL — ABNORMAL HIGH (ref 70–99)
Potassium: 3.9 mmol/L (ref 3.5–5.1)
Sodium: 138 mmol/L (ref 135–145)
Total Bilirubin: 0.4 mg/dL (ref 0.3–1.2)
Total Protein: 6.9 g/dL (ref 6.5–8.1)

## 2021-12-11 LAB — URINALYSIS, ROUTINE W REFLEX MICROSCOPIC
Bilirubin Urine: NEGATIVE
Glucose, UA: NEGATIVE mg/dL
Hgb urine dipstick: NEGATIVE
Ketones, ur: NEGATIVE mg/dL
Leukocytes,Ua: NEGATIVE
Nitrite: NEGATIVE
Protein, ur: NEGATIVE mg/dL
Specific Gravity, Urine: 1.024 (ref 1.005–1.030)
pH: 6 (ref 5.0–8.0)

## 2021-12-11 LAB — CBC
HCT: 39.6 % (ref 36.0–46.0)
Hemoglobin: 13.1 g/dL (ref 12.0–15.0)
MCH: 33.9 pg (ref 26.0–34.0)
MCHC: 33.1 g/dL (ref 30.0–36.0)
MCV: 102.3 fL — ABNORMAL HIGH (ref 80.0–100.0)
Platelets: 276 10*3/uL (ref 150–400)
RBC: 3.87 MIL/uL (ref 3.87–5.11)
RDW: 13.2 % (ref 11.5–15.5)
WBC: 9.1 10*3/uL (ref 4.0–10.5)
nRBC: 0 % (ref 0.0–0.2)

## 2021-12-11 LAB — LIPASE, BLOOD: Lipase: 43 U/L (ref 11–51)

## 2021-12-11 MED ORDER — SUCRALFATE 1 G PO TABS
1.0000 g | ORAL_TABLET | Freq: Three times a day (TID) | ORAL | Status: DC
  Administered 2021-12-11: 1 g via ORAL
  Filled 2021-12-11: qty 1

## 2021-12-11 MED ORDER — ONDANSETRON 4 MG PO TBDP
4.0000 mg | ORAL_TABLET | Freq: Once | ORAL | Status: AC
  Administered 2021-12-11: 4 mg via ORAL
  Filled 2021-12-11: qty 1

## 2021-12-11 MED ORDER — DICYCLOMINE HCL 20 MG PO TABS: 20.0000 mg | ORAL_TABLET | Freq: Two times a day (BID) | ORAL | 0 refills | Status: DC | PRN

## 2021-12-11 MED ORDER — ALUM & MAG HYDROXIDE-SIMETH 200-200-20 MG/5ML PO SUSP
30.0000 mL | Freq: Once | ORAL | Status: DC
  Filled 2021-12-11: qty 30

## 2021-12-11 MED ORDER — LIDOCAINE VISCOUS HCL 2 % MT SOLN
15.0000 mL | Freq: Once | OROMUCOSAL | Status: DC
  Filled 2021-12-11: qty 15

## 2021-12-11 MED ORDER — LOPERAMIDE HCL 2 MG PO CAPS
2.0000 mg | ORAL_CAPSULE | Freq: Four times a day (QID) | ORAL | 0 refills | Status: DC | PRN
Start: 2021-12-11 — End: 2022-01-01

## 2021-12-11 MED ORDER — SUCRALFATE 1 G PO TABS: 1.0000 g | ORAL_TABLET | Freq: Three times a day (TID) | ORAL | 0 refills | Status: DC

## 2021-12-11 MED ORDER — DICYCLOMINE HCL 10 MG PO CAPS
10.0000 mg | ORAL_CAPSULE | Freq: Once | ORAL | Status: AC
  Administered 2021-12-11: 10 mg via ORAL
  Filled 2021-12-11: qty 1

## 2021-12-11 NOTE — ED Provider Notes (Signed)
[facility] Provider Note   CSN: [id number] Arrival date & time: 12/11/21  1422     History  Chief Complaint  Patient presents with   Abdominal Pain    [name] is a 56 y.o. female presenting for evaluation of nausea, vomiting, abdominal pain.  Patient states for the past month, she has not been feeling well.  She reports diarrhea, nausea, vomiting, and upper abdominal pain.  This occurs after she eats.  No fevers or chills.  She does report weight loss.  She does tolerate popsicles without difficulty, but does not do well with other solid foods.  She had stomach issues 4 yrs ago, dx with SBO at that time.  She follows with Dr. Laural Golden from GI, but has not talked to them regarding her symptoms currently.  She takes tonics, does not take anything else for her stomach.  HPI     Home Medications Prior to Admission medications   Medication Sig Start Date End Date Taking? Authorizing Provider  dicyclomine (BENTYL) 20 MG tablet Take 1 tablet (20 mg total) by mouth 2 (two) times daily as needed for spasms. 12/11/21  Yes Sorren Vallier, PA-C  loperamide (IMODIUM) 2 MG capsule Take 1 capsule (2 mg total) by mouth 4 (four) times daily as needed for diarrhea or loose stools. 12/11/21  Yes Marlowe Lawes, PA-C  sucralfate (CARAFATE) 1 g tablet Take 1 tablet (1 g total) by mouth 4 (four) times daily -  with meals and at bedtime. 12/11/21  Yes Elizeo Rodriques, PA-C  albuterol (VENTOLIN HFA) 108 (90 Base) MCG/ACT inhaler Inhale 2 puffs into the lungs every 4 (four) hours as needed for wheezing or shortness of breath.  12/22/19   [provider]  chlorpheniramine-HYDROcodone (TUSSIONEX PENNKINETIC ER) 10-8 MG/5ML SUER Take 5 mLs by mouth every 12 (twelve) hours as needed for cough. 12/04/21   Volney American, PA-C  Cholecalciferol (VITAMIN D3) 125 MCG (5000 UT) CAPS Take 5,000 Units by mouth daily.     [provider]  estradiol (ESTRACE) 2 MG tablet  Take 1 tablet (2 mg total) by mouth daily. 02/17/21   Estill Dooms, NP  ferrous sulfate 325 (65 FE) MG tablet Take 325 mg by mouth as needed.    [provider]  gabapentin (NEURONTIN) 400 MG capsule Take 1 capsule (400 mg total) by mouth at bedtime. 12/25/16   Setzer, Rona Ravens, NP  ibuprofen (ADVIL) 200 MG tablet Take 400-600 mg by mouth every 6 (six) hours as needed for moderate pain.    [provider]  Iron Combinations (CHROMAGEN) capsule Take 1 capsule by mouth daily. 04/14/21 04/14/22  Estill Dooms, NP  lidocaine (XYLOCAINE) 2 % solution Use as directed 5 mLs in the mouth or throat as needed for mouth pain. 11/20/21   Volney American, PA-C  lisinopril (ZESTRIL) 5 MG tablet Take 1 tablet (5 mg total) by mouth at bedtime. 03/27/21   Verta Ellen., NP  ondansetron (ZOFRAN-ODT) 8 MG disintegrating tablet DISSOLVE 1 TABLET ON THE TONGUE EVERY 8 HOURS AS NEEDED FOR NAUSEA AND VOMITING 07/25/20   Montez Morita, Quillian Quince, MD  oseltamivir (TAMIFLU) 75 MG capsule Take 1 capsule (75 mg total) by mouth every 12 (twelve) hours. 11/20/21   Volney American, PA-C  pantoprazole (PROTONIX) 40 MG tablet Take 1 tablet (40 mg total) by mouth daily. 01/02/21   Rehman, Mechele Dawley, MD  predniSONE (DELTASONE) 20 MG tablet Take 2 tablets (40 mg total)  by mouth daily with breakfast. 12/04/21   Volney American, PA-C      Allergies    Imitrex [sumatriptan], Cymbalta [duloxetine hcl], Latex, Propoxyphene n-acetaminophen, and Tramadol    Review of Systems   Review of Systems  Gastrointestinal:  Positive for abdominal pain, diarrhea, nausea and vomiting.  All other systems reviewed and are negative.  Physical Exam Updated Vital Signs BP 126/75    Pulse 100    Temp 98.4 F (36.9 C) (Oral)    Resp 18    SpO2 97%  Physical Exam Vitals and nursing note reviewed.  Constitutional:      General: She is not in acute distress.    Appearance: Normal appearance.      Comments: Resting in the bed in NAD  HENT:     Head: Normocephalic and atraumatic.  Eyes:     Extraocular Movements: Extraocular movements intact.     Conjunctiva/sclera: Conjunctivae normal.     Pupils: Pupils are equal, round, and reactive to light.  Cardiovascular:     Rate and Rhythm: Normal rate and regular rhythm.     Pulses: Normal pulses.  Pulmonary:     Effort: Pulmonary effort is normal. No respiratory distress.     Breath sounds: Normal breath sounds. No wheezing.     Comments: Speaking in full sentences.  Clear lung sounds in all fields. Abdominal:     General: There is no distension.     Palpations: Abdomen is soft. There is no mass.     Tenderness: There is abdominal tenderness. There is no guarding or rebound.     Comments: Mild ttp of epigastic abd. No rigidity or distention. Negative murphys  Musculoskeletal:        General: Normal range of motion.     Cervical back: Normal range of motion and neck supple.  Skin:    General: Skin is warm and dry.     Capillary Refill: Capillary refill takes less than 2 seconds.  Neurological:     Mental Status: She is alert and oriented to person, place, and time.  Psychiatric:        Mood and Affect: Mood and affect normal.        Speech: Speech normal.        Behavior: Behavior normal.    ED Results / Procedures / Treatments   Labs (all labs ordered are listed, but only abnormal results are displayed) Labs Reviewed  COMPREHENSIVE METABOLIC PANEL - Abnormal; Notable for the following components:      Result Value   Glucose, Bld 131 (*)    All other components within normal limits  CBC - Abnormal; Notable for the following components:   MCV 102.3 (*)    All other components within normal limits  LIPASE, BLOOD  URINALYSIS, ROUTINE W REFLEX MICROSCOPIC    EKG None  Radiology DG Abdomen Acute W/Chest  Result Date: 12/11/2021 CLINICAL DATA:  Evaluate for free air.  Abdominal pain. EXAM: DG ABDOMEN ACUTE WITH 1 VIEW  CHEST COMPARISON:  CT abdomen and pelvis 06/08/2020. FINDINGS: There is no evidence of dilated bowel loops or free intraperitoneal air. No suspicious calcifications overlie the kidneys. Rounded calcifications in the lower central pelvis are unchanged from prior CT. Surgical clips are seen in the right abdomen and within the pelvis. Heart size and mediastinal contours are within normal limits. Both lungs are clear. IMPRESSION: Negative abdominal radiographs.  No acute cardiopulmonary disease. Electronically Signed   By: Tina Griffiths.D.  On: 12/11/2021 20:06    Procedures Procedures    Medications Ordered in ED Medications  alum & mag hydroxide-simeth (MAALOX/MYLANTA) 200-200-20 MG/5ML suspension 30 mL (30 mLs Oral Patient Refused/Not Given 12/11/21 1932)    And  lidocaine (XYLOCAINE) 2 % viscous mouth solution 15 mL (15 mLs Oral Patient Refused/Not Given 12/11/21 1932)  sucralfate (CARAFATE) tablet 1 g (1 g Oral Given 12/11/21 2105)  ondansetron (ZOFRAN-ODT) disintegrating tablet 4 mg (4 mg Oral Given 12/11/21 1928)  dicyclomine (BENTYL) capsule 10 mg (10 mg Oral Given 12/11/21 2059)    ED Course/ Medical Decision Making/ A&P                           Medical Decision Making   This patient presents to the ED for concern of n/v/d, abd pain. This involves a number of treatment options, and is a complaint that carries with it a moderate risk of complications and morbidity.  The differential diagnosis includes gastritis, PUD, viral GI illness, hiatal hernia, gastroparesis, IBS/IBD.   Co morbidities:  gerd  Lab Tests:  I ordered, and personally interpreted labs.  The pertinent results include: No leukocytosis.  Kidney, liver, pancreatic function normal.  Urine negative for infection.   Imaging Studies:  I ordered imaging studies including abdominal x-ray I independently visualized and interpreted imaging which showed no free air or no obstructed bowel gas pattern.  I agree with the  radiologist interpretation   Medicines ordered:  I ordered medication including zofran, carafate, bentyl for abd pain Reevaluation of the patient after these medicines showed that the patient improved I have reviewed the patients home medicines and have made adjustments as needed   Test Considered:  doubt intra-abdominal infection, perforation, obstruction, or surgical abdomen.  Do not feel emergent CT is necessary at this time.   Dispostion:  After consideration of the diagnostic results and the patients response to treatment, I feel that the patent would benefit from symptomatic outpatient management with GI follow-up.  Labs are reassuring, patient tolerating p.o., and history is overall reassuring.  Doubt emergent or acute abdomen that would require surgery or hospitalization at this time.  Likely gastritis/PUD.  Discussed findings and plan with patient.  At this time, patient appears safe for discharge.  Return precautions given.  Patient states she understands and agrees to plan.  Final Clinical Impression(s) / ED Diagnoses Final diagnoses:  Upper abdominal pain    Rx / DC Orders ED Discharge Orders          Ordered    sucralfate (CARAFATE) 1 g tablet  3 times daily with meals & bedtime        12/11/21 2052    dicyclomine (BENTYL) 20 MG tablet  2 times daily PRN        12/11/21 2052    loperamide (IMODIUM) 2 MG capsule  4 times daily PRN        12/11/21 2052              Franchot Heidelberg, PA-C 12/11/21 2239    Truddie Hidden, MD 12/12/21 2227

## 2021-12-11 NOTE — ED Triage Notes (Signed)
States she has some stomach issues and has a history of small bowel obstruction. Patient states symptoms started over a month ago

## 2021-12-11 NOTE — Discharge Instructions (Signed)
Take Zofran as needed for nausea or vomiting. Make sure you continue to take your heartburn medication as prescribed. I recommend you use Bentyl and Carafate to help with pain control.  Use Imodium to help with your diarrhea. But most importantly, I recommend you follow-up with your GI doctor for further evaluation. Return to the emergency room if you develop fevers, persistent vomiting despite medication, blood in your vomit or stool, or any new, worsening, or concerning symptoms

## 2022-01-01 ENCOUNTER — Ambulatory Visit (INDEPENDENT_AMBULATORY_CARE_PROVIDER_SITE_OTHER): Payer: 59 | Admitting: Gastroenterology

## 2022-01-01 ENCOUNTER — Encounter (INDEPENDENT_AMBULATORY_CARE_PROVIDER_SITE_OTHER): Payer: Self-pay | Admitting: Gastroenterology

## 2022-01-01 ENCOUNTER — Other Ambulatory Visit: Payer: Self-pay

## 2022-01-01 VITALS — BP 148/87 | HR 90 | Temp 98.4°F | Ht 63.0 in | Wt 193.2 lb

## 2022-01-01 DIAGNOSIS — R197 Diarrhea, unspecified: Secondary | ICD-10-CM

## 2022-01-01 DIAGNOSIS — R112 Nausea with vomiting, unspecified: Secondary | ICD-10-CM

## 2022-01-01 DIAGNOSIS — R1013 Epigastric pain: Secondary | ICD-10-CM

## 2022-01-01 MED ORDER — SUCRALFATE 1 GM/10ML PO SUSP: 1.0000 g | Freq: Four times a day (QID) | ORAL | 2 refills | Status: DC

## 2022-01-01 NOTE — Patient Instructions (Signed)
I have sent carafate 1g tablets to your pharmacy, you can take these before each meal and at bedtime for pain relief. Please continue protonix 40mg  once a day for now We will get you scheduled for upper endoscopy for further evaluation Please avoid NSAIDs (advil, aleve, naproxen, goody powder, ibuprofen) as these can be very hard on your GI tract, causing inflammation, ulcers and damage to the lining of your GI tract.

## 2022-01-01 NOTE — Progress Notes (Signed)
Referring Provider: Asencion Noble, MD Primary Care Physician:  Asencion Noble, MD Primary GI Physician: Rehman  Chief Complaint  Patient presents with   Abdominal Pain    Follow up ED visit for abdominal pain. Patient states pain started upper abdomen after having a biopsy done a couple years ago. Sometimes nausea, vomiting and diarrhea. Has heartburn, gas and bloating.    HPI:   [name] is a 56 y.o. female with past medical history of arthritis, asthma, cervical cancer, fibromyalgia, GERD, high cholesterol, HTN, RA and jejunitis.   Patient presenting today for ED follow up of n/v and abdominal pain.   Patient presented to ED on 12/11/21 with diarrhea, nausea, vomiting and upper abdominal pain, worse after eating. Also endorses intolerance to solid foods. Labs done in ED were unremarkable as was abdominal xray, no CT imaging done at that time, patient discharged with carafate 1g tablet TID, bentyl 20mg  BID and imodium 2mg  QID PRN with recommendations to follow up with GI  Patient last seen in clinic august 2021 with similar symptoms of epigastric pain, nausea though most recent CT and EGD done around that time with only findings of gastritis, previously diagnosed with jejunitis in July 2020 with CT imaging confirming resolution after course of abx.  Patient states that since having the flu in dec she has had epigastric pain, nausea and diarrhea, 4-5x/day. Was on antibiotics back in December when she was sick, however stool studies with PCP thereafter were negative. Denies blood in stools or black stools. No alleviating factors of diarrhea, worse after eating with fecal urgency.  Nausea is unchanged with eating. Pain worse postprandial. Had carafate from the ED which she states helped some with her epigastric pain, though she only took it once a day and felt this was sufficient. She states that she takes ibuprofen occasionally. She denies any issues with reflux. She is on protonix 40mg  daily. She  states that she feels full all the time even when she hasn't eaten very much. Previously on dicylcomine but she states that it caused her to have constipation so she does not wish to take that anymore. She reports she has lost about 7 pounds since symptoms began as she avoids eating because she knows epigastric pain and diarrhea will be worse thereafter. She does have zofran that she takes as needed for nausea 3-4x/day. Patient requesting refill of hydrocodone for pain.   Last Colonoscopy:09/14/20- The examined portion of the ileum was normal. - One 6 mm polyp at the splenic flexure,tubular adenoma - One small polyp in the sigmoid colon. Tubular adenoma - Diverticulosis in the sigmoid colon. Last Endoscopy:10/16/2019 - Normal esophagus. - Z-line irregular, 36 cm from the incisors. - 2 cm hiatal hernia. - Gastritis. ( Mild reactive gastropathy) - Normal duodenal bulb, second portion of the duodenum and third portion of the duodenum. - Congested duodenal mucosa. (Benign small bowel mucosa, no dysplasia or malignancy) - Congested (edematous) jejunum. (Benign small bowel mucosa)  Recommendations:  Repeat colonoscopy recommended for oct 2025  Past Medical History:  Diagnosis Date   Arthritis    Asthma    triggered by weather changes; no inhaler   Cancer ([place])    Cervical   Collagen vascular disease ([place])    Fibromyalgia    Ganglion cyst of wrist 12/2012   left dorsal cyst   GERD (gastroesophageal reflux disease)    TUMS as needed   Headache(784.0)    tension/migraines   Hypercholesteremia    Hypertension  PONV (postoperative nausea and vomiting)    also states stopped breathing after hernia surgery   Rectal bleeding    Rheumatoid arthritis ([place])    Stenosing tenosynovitis of finger 12/2012   left middle finger   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 [facility])     Past Surgical History:  Procedure Laterality Date   [place]   BIOPSY   12/01/2018   Procedure: BIOPSY;  Surgeon: Rogene Houston, MD;  [facility];  Service: Endoscopy;;  gastric    BIOPSY N/A 10/16/2019   Procedure: DUODENUM and JEJUNUM BIOPSY;  Surgeon: Rogene Houston, MD;  [facility];  Service: Endoscopy;  Laterality: N/A;   COLONOSCOPY N/A 05/08/2013   Procedure: COLONOSCOPY;  Surgeon: Rogene Houston, MD;  [facility];  Service: Endoscopy;  Laterality: N/A;  240   COLONOSCOPY N/A 09/23/2015   Procedure: COLONOSCOPY;  Surgeon: Rogene Houston, MD;  [facility];  Service: Endoscopy;  Laterality: N/A;  825   COLONOSCOPY WITH PROPOFOL N/A 09/14/2020   Procedure: COLONOSCOPY WITH PROPOFOL;  Surgeon: Rogene Houston, MD;  [facility];  Service: Endoscopy;  Laterality: N/A;  [place] AND BIOPSY  04/01/2007   ECTOPIC PREGNANCY SURGERY  1995   left tube and ovary removed   ENTEROSCOPY N/A 10/16/2019   Procedure: PUSH ENTEROSCOPY;  Surgeon: Rogene Houston, MD;  [facility];  Service: Endoscopy;  Laterality: N/A;   ESOPHAGOGASTRODUODENOSCOPY N/A 12/01/2018   Procedure: ESOPHAGOGASTRODUODENOSCOPY (EGD);  Surgeon: Rogene Houston, MD;  [facility];  Service: Endoscopy;  Laterality: N/A;  12:45   ESOPHAGOGASTRODUODENOSCOPY (EGD) WITH PROPOFOL N/A 10/16/2019   Procedure: ESOPHAGOGASTRODUODENOSCOPY (EGD) WITH PROPOFOL;  Surgeon: Rogene Houston, MD;  [facility];  Service: Endoscopy;  Laterality: N/A;  1430pm   GANGLION CYST EXCISION  12/17/2012   Procedure: REMOVAL GANGLION OF WRIST;  Surgeon: Wynonia Sours, MD;  Location: [place];  Service: Orthopedics;  Laterality: Left;   HARDWARE REMOVAL Right 08/09/2014   Procedure: Placement Right Ankle Tight Rope, Removal Plate and Screws;  Surgeon: Marybelle Killings, MD;  Location: [place];  Service: Orthopedics;  Laterality: Right;   POLYPECTOMY  09/14/2020   Procedure: POLYPECTOMY;  Surgeon:  Rogene Houston, MD;  [facility];  Service: Endoscopy;;   PUBOVAGINAL SLING  2010   rt ankle surgery     SALPINGOOPHORECTOMY  1996   right   TRIGGER FINGER RELEASE  12/17/2012   Procedure: RELEASE TRIGGER FINGER/A-1 PULLEY;  Surgeon: Wynonia Sours, MD;  Location: [place];  Service: Orthopedics;  Laterality: Left;   TUBAL LIGATION  1992   VAGINA RECONSTRUCTION SURGERY     x 5 since 2010   VENTRAL HERNIA REPAIR  09/06/2006    Current Outpatient Medications  Medication Sig Dispense Refill   albuterol (VENTOLIN HFA) 108 (90 Base) MCG/ACT inhaler Inhale 2 puffs into the lungs every 4 (four) hours as needed for wheezing or shortness of breath.      Cholecalciferol (VITAMIN D3) 125 MCG (5000 UT) CAPS Take 5,000 Units by mouth daily.      gabapentin (NEURONTIN) 400 MG capsule Take 1 capsule (400 mg total) by mouth at bedtime. 90 capsule 0   ibuprofen (ADVIL) 200 MG tablet Take 400-600 mg by mouth every 6 (six) hours as needed for moderate pain.  olmesartan (BENICAR) 5 MG tablet Take 5 mg by mouth daily.     ondansetron (ZOFRAN-ODT) 8 MG disintegrating tablet DISSOLVE 1 TABLET ON THE TONGUE EVERY 8 HOURS AS NEEDED FOR NAUSEA AND VOMITING 12 tablet 4   pantoprazole (PROTONIX) 40 MG tablet Take 1 tablet (40 mg total) by mouth daily. 90 tablet 3   No current facility-administered medications for this visit.    Allergies as of 01/01/2022 - Review Complete 01/01/2022  Allergen Reaction Noted   Imitrex [sumatriptan]  04/29/2013   Cymbalta [duloxetine hcl] Rash 04/29/2013   Latex Rash 12/11/2012   Propoxyphene n-acetaminophen Nausea And Vomiting and Rash    Tramadol Rash 08/05/2014    Family History  Problem Relation Age of Onset   Lung cancer Mother    Cervical cancer Mother    Heart disease Father    Leukemia Father    Cancer Maternal Grandmother        bladder and colon cancer    Social History   Socioeconomic History   Marital status: Married     Spouse name: Not on file   Number of children: 2   Years of education: Not on file   Highest education level: Not on file  Occupational History   Occupation: Press photographer  Tobacco Use   Smoking status: Never   Smokeless tobacco: Never  Vaping Use   Vaping Use: Never used  Substance and Sexual Activity   Alcohol use: No   Drug use: No   Sexual activity: Yes    Birth control/protection: Surgical    Comment: hyst tubal  Other Topics Concern   Not on file  Social History Narrative   Not on file   Social Determinants of Health   Financial Resource Strain: Low Risk    Difficulty of Paying Living Expenses: Not hard at all  Food Insecurity: No Food Insecurity   Worried About Charity fundraiser in the Last Year: Never true   Arboriculturist in the Last Year: Never true  Transportation Needs: No Transportation Needs   Lack of Transportation (Medical): No   Lack of Transportation (Non-Medical): No  Physical Activity: Inactive   Days of Exercise per Week: 0 days   Minutes of Exercise per Session: 0 min  Stress: Stress Concern Present   Feeling of Stress : To some extent  Social Connections: Unknown   Frequency of Communication with Friends and Family: More than three times a week   Frequency of Social Gatherings with Friends and Family: Once a week   Attends Religious Services: Patient refused   Marine scientist or Organizations: Patient refused   Attends Music therapist: Patient refused   Marital Status: Married   Review of systems General: negative for malaise, night sweats, fever, chills, +weight loss Neck: Negative for lumps, goiter, pain and significant neck swelling Resp: Negative for cough, wheezing, dyspnea at rest CV: Negative for chest pain, leg swelling, palpitations, orthopnea GI: denies melena, hematochezia, nausea,  constipation, dysphagia, odyonophagia, early satiety, +weight loss +nausea +diarrhea +epigastric pain MSK: Negative for joint pain or  swelling, back pain, and muscle pain. Derm: Negative for itching or rash Psych: Denies depression, anxiety, memory loss, confusion. No homicidal or suicidal ideation.  Heme: Negative for prolonged bleeding, bruising easily, and swollen nodes. Endocrine: Negative for cold or heat intolerance, polyuria, polydipsia and goiter. Neuro: negative for tremor, gait imbalance, syncope and seizures. The remainder of the review of systems is noncontributory.  Physical Exam: BP Marland Kitchen)  148/87 (BP Location: Right Arm, Patient Position: Sitting, Cuff Size: Large)    Pulse 90    Temp 98.4 F (36.9 C) (Oral)    Ht 5\' 3"  (1.6 m)    Wt 193 lb 3.2 oz (87.6 kg)    BMI 34.22 kg/m  General:   Alert and oriented. No distress noted. Pleasant and cooperative.  Head:  Normocephalic and atraumatic. Eyes:  Conjuctiva clear without scleral icterus. Mouth:  Oral mucosa pink and moist. Good dentition. No lesions. Heart: Normal rate and rhythm, s1 and s2 heart sounds present.  Lungs: Clear lung sounds in all lobes. Respirations equal and unlabored. Abdomen:  +BS, soft,  non-distended. Tenderness with light palpation of epigastric region as well as LUQ. No rebound or guarding. No HSM or masses noted. Derm: No palmar erythema or jaundice Msk:  Symmetrical without gross deformities. Normal posture. Extremities:  Without edema. Neurologic:  Alert and  oriented x4 Psych:  Alert and cooperative. Normal mood and affect.  Invalid input(s): 6 MONTHS   ASSESSMENT: [name] is a 56 y.o. female presenting today for ongoing nausea, diarrhea and abdominal pain since having the flu in December.  Patient with sharp epigastric pain, worse after eating, as well as 4-5 episodes of loose to watery diarrhea per day, with nausea and fecal urgency after eating as well. She denies any episodes of vomiting, rectal bleeding or melena, fevers or chills. Recent stool studies were negative.  Feels acid reflux is well controlled on once daily  PPI. Carafate prescribed during ED visit seemed to help with her epigastric discomfort.  Case discussed with Dr Laural Golden as patient has hx of jejunitis in the past, who recommends trying bentyl 10-20mg  daily as well as addition of carafate 1g QID, if no improvement in symptoms will proceed with EGD for further evaluation of patient's symptoms.    PLAN:  Bentyl 10-20mg  daily 2. Continue PPI once daily at this time, further recommendations to follow EGD 3. 1g carafate QID 4. Avoid NSAIDs 5. EGD if no improvement with medication regimen implementation  Follow Up: TBD   Jaclyn Alverio L. Alver Sorrow, MSN, APRN, AGNP-C Adult-Gerontology Nurse Practitioner [facility]

## 2022-01-02 ENCOUNTER — Encounter (INDEPENDENT_AMBULATORY_CARE_PROVIDER_SITE_OTHER): Payer: Self-pay

## 2022-01-02 ENCOUNTER — Other Ambulatory Visit (INDEPENDENT_AMBULATORY_CARE_PROVIDER_SITE_OTHER): Payer: Self-pay

## 2022-01-02 DIAGNOSIS — R110 Nausea: Secondary | ICD-10-CM

## 2022-01-02 DIAGNOSIS — R1013 Epigastric pain: Secondary | ICD-10-CM

## 2022-01-04 ENCOUNTER — Other Ambulatory Visit (INDEPENDENT_AMBULATORY_CARE_PROVIDER_SITE_OTHER): Payer: Self-pay | Admitting: Internal Medicine

## 2022-01-04 DIAGNOSIS — R197 Diarrhea, unspecified: Secondary | ICD-10-CM | POA: Insufficient documentation

## 2022-01-04 MED ORDER — DICYCLOMINE HCL 10 MG PO CAPS: 10.0000 mg | ORAL_CAPSULE | Freq: Two times a day (BID) | ORAL | 0 refills | Status: DC | PRN

## 2022-01-04 MED ORDER — SUCRALFATE 1 G PO TABS: 1.0000 g | ORAL_TABLET | Freq: Three times a day (TID) | ORAL | 1 refills | Status: DC

## 2022-01-04 NOTE — Progress Notes (Signed)
I talked with patient. She says it took 2 weeks for her to get over symptoms improved. Her GI symptoms have not improved. Last EGD with duodenoscopy was in November 2020 for similar but more severe symptoms requiring hospitalization.  I would like patient to try dicyclomine 10 mg by mouth every morning with a second dose on as-needed basis.  When she took it 3 times a day she got constipated. She also want me to change her sucralfate prescription to pill which done. Patient will try this combination for 2 weeks.  Hopefully she would notice significant improvement in which she has felt [name] EGD otherwise she is scheduled for the procedure on 01/25/2022.

## 2022-01-15 ENCOUNTER — Other Ambulatory Visit (INDEPENDENT_AMBULATORY_CARE_PROVIDER_SITE_OTHER): Payer: Self-pay | Admitting: Internal Medicine

## 2022-01-23 ENCOUNTER — Other Ambulatory Visit (HOSPITAL_COMMUNITY): Payer: 59

## 2022-01-25 ENCOUNTER — Ambulatory Visit (HOSPITAL_COMMUNITY): Admit: 2022-01-25 | Payer: 59 | Admitting: Internal Medicine

## 2022-01-25 ENCOUNTER — Encounter (HOSPITAL_COMMUNITY): Payer: Self-pay

## 2022-01-25 SURGERY — ESOPHAGOGASTRODUODENOSCOPY (EGD) WITH PROPOFOL
Anesthesia: Monitor Anesthesia Care

## 2022-01-26 ENCOUNTER — Other Ambulatory Visit (INDEPENDENT_AMBULATORY_CARE_PROVIDER_SITE_OTHER): Payer: Self-pay | Admitting: Internal Medicine

## 2022-01-26 ENCOUNTER — Other Ambulatory Visit (HOSPITAL_COMMUNITY): Payer: Self-pay | Admitting: Obstetrics & Gynecology

## 2022-01-26 ENCOUNTER — Encounter (INDEPENDENT_AMBULATORY_CARE_PROVIDER_SITE_OTHER): Payer: Self-pay

## 2022-01-26 DIAGNOSIS — Z1231 Encounter for screening mammogram for malignant neoplasm of breast: Secondary | ICD-10-CM

## 2022-01-29 NOTE — Telephone Encounter (Signed)
Dr. Laural Golden talked to patient on 2/16 and wanted her to try these meds for next 2 weeks. Pt was scheduled for EGD on 2/16 that was canceled. I called pt and she states she is doing better since taking  meds and would like to continue meds and did not want to schedule EGD at this time.

## 2022-01-31 NOTE — Telephone Encounter (Signed)
Ok per dr Laural Golden to give 90 day with one refill. Refills sent to pharm.

## 2022-02-19 ENCOUNTER — Ambulatory Visit (HOSPITAL_COMMUNITY): Payer: 59

## 2022-02-21 ENCOUNTER — Ambulatory Visit (HOSPITAL_COMMUNITY)
Admission: RE | Admit: 2022-02-21 | Discharge: 2022-02-21 | Disposition: A | Discharge status: Home / Self Care | Payer: 59 | Source: Ambulatory Visit | Attending: Obstetrics & Gynecology | Admitting: Obstetrics & Gynecology

## 2022-02-21 ENCOUNTER — Other Ambulatory Visit: Payer: Self-pay

## 2022-02-21 DIAGNOSIS — Z1231 Encounter for screening mammogram for malignant neoplasm of breast: Secondary | ICD-10-CM | POA: Insufficient documentation

## 2022-03-17 ENCOUNTER — Ambulatory Visit
Admission: EM | Admit: 2022-03-17 | Discharge: 2022-03-17 | Disposition: A | Discharge status: Home / Self Care | Payer: 59 | Attending: Family Medicine | Admitting: Family Medicine

## 2022-03-17 DIAGNOSIS — J209 Acute bronchitis, unspecified: Secondary | ICD-10-CM

## 2022-03-17 DIAGNOSIS — J3089 Other allergic rhinitis: Secondary | ICD-10-CM

## 2022-03-17 MED ORDER — CETIRIZINE HCL 10 MG PO TABS: 10.0000 mg | ORAL_TABLET | Freq: Every day | ORAL | 2 refills | Status: DC

## 2022-03-17 MED ORDER — FLUTICASONE PROPIONATE 50 MCG/ACT NA SUSP
1.0000 | Freq: Two times a day (BID) | NASAL | 2 refills | Status: DC
Start: 2022-03-17 — End: 2023-01-31

## 2022-03-17 MED ORDER — PREDNISONE 20 MG PO TABS: 40.0000 mg | ORAL_TABLET | Freq: Every day | ORAL | 0 refills | Status: DC

## 2022-03-17 MED ORDER — PROMETHAZINE-DM 6.25-15 MG/5ML PO SYRP: 5.0000 mL | ORAL_SOLUTION | Freq: Four times a day (QID) | ORAL | 0 refills | Status: DC | PRN

## 2022-03-17 NOTE — ED Triage Notes (Signed)
Pt states that about a week ago her cough and sore throat started ? ?Pt states she had left over promethazine from being sick in December and used it for 2 days, her prescribed allergy meds and inhaler ? ?Pt states that her cough feels like Bronchitis ? ?Denies Fever ? ? ?

## 2022-03-21 NOTE — ED Provider Notes (Signed)
?[place] ? ? ? ?CSN: [id number] ?Arrival date & time: 03/17/22  0801 ? ? ?  ? ?History   ?Chief Complaint ?Chief Complaint  ?Patient presents with  ? Cough  ?  Sore throat and cough  ? ? ?HPI ?[name] is a 56 y.o. female.  ? ?Patient presenting today with over a week of cough, sore throat, congestion, chest tightness.  Denies fever, chills, chest pain, shortness of breath, abdominal pain, nausea vomiting or diarrhea.  Has been trying some over-the-counter allergy meds, inhaler, Phenergan DM with mild relief.  Denies any new sick contacts recently.  History of seasonal allergies, asthma on albuterol as needed.   ? ? ?Past Medical History:  ?Diagnosis Date  ? Arthritis   ? Asthma   ? triggered by weather changes; no inhaler  ? Cancer [facility])   ? Cervical  ? Collagen vascular disease ([place])   ? Fibromyalgia   ? Ganglion cyst of wrist 12/2012  ? left dorsal cyst  ? GERD (gastroesophageal reflux disease)   ? TUMS as needed  ? Headache(784.0)   ? tension/migraines  ? Hypercholesteremia   ? Hypertension   ? PONV (postoperative nausea and vomiting)   ? also states stopped breathing after hernia surgery  ? Rectal bleeding   ? Rheumatoid arthritis ([place])   ? Stenosing tenosynovitis of finger 12/2012  ? left middle finger  ? 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 [facility])   ? ? ?Patient Active Problem List  ? Diagnosis Date Noted  ? Diarrhea 01/04/2022  ? Vitamin B 12 deficiency 05/19/2021  ? Tired 04/14/2021  ? Anxiety and depression 02/17/2021  ? Encounter for screening fecal occult blood testing 02/17/2021  ? Encounter for well woman exam with routine gynecological exam 02/17/2021  ? Current use of estrogen therapy 02/17/2021  ? Urine frequency 04/13/2020  ? Nausea and vomiting 10/08/2019  ? CRP elevated 10/05/2019  ? Noninfectious jejunitis 06/29/2019  ? Anemia 06/29/2019  ? Anemia due to vitamin B12 deficiency   ? Nausea   ? Gastroesophageal reflux disease 10/23/2018  ? Abdominal pain, epigastric 10/23/2018  ?  Chest pain 10/16/2018  ? Stiffness of joint, not elsewhere classified, ankle and foot 09/07/2014  ? Pain in joint, ankle and foot 09/07/2014  ? Difficulty in walking(719.7) 09/07/2014  ? 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 Hematochezia 08/11/2013  ? Constipation 08/11/2013  ? RHEUMATOID ARTHRITIS 02/20/2010  ? KNEE PAIN 02/20/2010  ? FIBROMYALGIA 02/20/2010  ? ? ?Past Surgical History:  ?Procedure Laterality Date  ? ABDOMINAL HYSTERECTOMY  1994  ? APPENDECTOMY  1996  ? BIOPSY  12/01/2018  ? Procedure: BIOPSY;  Surgeon: Rogene Houston, MD;  [facility];  Service: Endoscopy;;  gastric ?  ? BIOPSY N/A 10/16/2019  ? Procedure: DUODENUM and JEJUNUM BIOPSY;  Surgeon: Rogene Houston, MD;  [facility];  Service: Endoscopy;  Laterality: N/A;  ? COLONOSCOPY N/A 05/08/2013  ? Procedure: COLONOSCOPY;  Surgeon: Rogene Houston, MD;  [facility];  Service: Endoscopy;  Laterality: N/A;  240  ? COLONOSCOPY N/A 09/23/2015  ? Procedure: COLONOSCOPY;  Surgeon: Rogene Houston, MD;  [facility];  Service: Endoscopy;  Laterality: N/A;  825  ? COLONOSCOPY WITH PROPOFOL N/A 09/14/2020  ? Procedure: COLONOSCOPY WITH PROPOFOL;  Surgeon: Rogene Houston, MD;  [facility];  Service: Endoscopy;  Laterality: N/A;  730  ? CYSTOSCOPY WITH HYDRODISTENSION AND BIOPSY  04/01/2007  ? ECTOPIC PREGNANCY  SURGERY  1995  ? left tube and ovary removed  ? ENTEROSCOPY N/A 10/16/2019  ? Procedure: PUSH ENTEROSCOPY;  Surgeon: Rogene Houston, MD;  [facility];  Service: Endoscopy;  Laterality: N/A;  ? ESOPHAGOGASTRODUODENOSCOPY N/A 12/01/2018  ? Procedure: ESOPHAGOGASTRODUODENOSCOPY (EGD);  Surgeon: Rogene Houston, MD;  [facility];  Service: Endoscopy;  Laterality: N/A;  12:45  ? ESOPHAGOGASTRODUODENOSCOPY (EGD) WITH PROPOFOL N/A 10/16/2019  ? Procedure: ESOPHAGOGASTRODUODENOSCOPY (EGD) WITH PROPOFOL;  Surgeon: Rogene Houston, MD;  [facility];  Service:  Endoscopy;  Laterality: N/A;  1430pm  ? GANGLION CYST EXCISION  12/17/2012  ? Procedure: REMOVAL GANGLION OF WRIST;  Surgeon: Wynonia Sours, MD;  Location: [place];  Service: Orthopedics;  Laterality: Left;  ? HARDWARE REMOVAL Right 08/09/2014  ? Procedure: Placement Right Ankle Tight Rope, Removal Plate and Screws;  Surgeon: Marybelle Killings, MD;  Location: [place];  Service: Orthopedics;  Laterality: Right;  ? POLYPECTOMY  09/14/2020  ? Procedure: POLYPECTOMY;  Surgeon: Rogene Houston, MD;  [facility];  Service: Endoscopy;;  ? PUBOVAGINAL SLING  2010  ? rt ankle surgery    ? SALPINGOOPHORECTOMY  1996  ? right  ? TRIGGER FINGER RELEASE  12/17/2012  ? Procedure: RELEASE TRIGGER FINGER/A-1 PULLEY;  Surgeon: Wynonia Sours, MD;  Location: [facility];  Service: Orthopedics;  Laterality: Left;  ? TUBAL LIGATION  1992  ? VAGINA RECONSTRUCTION SURGERY    ? x 5 since 2010  ? VENTRAL HERNIA REPAIR  09/06/2006  ? ? ?OB History   ? ? Gravida  ?3  ? Para  ?2  ? Term  ?2  ? Preterm  ?0  ? AB  ?1  ? Living  ?2  ?  ? ? SAB  ?   ? IAB  ?   ? Ectopic  ?1  ? Multiple  ?   ? Live Births  ?2  ?   ?  ?  ? ? ? ?Home Medications   ? ?Prior to Admission medications   ?Medication Sig Start Date End Date Taking? Authorizing Provider  ?cetirizine (ZYRTEC ALLERGY) 10 MG tablet Take 1 tablet (10 mg total) by mouth daily. 03/17/22  Yes Volney American, PA-C  ?fluticasone (FLONASE) 50 MCG/ACT nasal spray Place 1 spray into both nostrils 2 (two) times daily. 03/17/22  Yes Volney American, PA-C  ?predniSONE (DELTASONE) 20 MG tablet Take 2 tablets (40 mg total) by mouth daily with breakfast. 03/17/22  Yes Volney American, PA-C  ?promethazine-dextromethorphan (PROMETHAZINE-DM) 6.25-15 MG/5ML syrup Take 5 mLs by mouth 4 (four) times daily as needed. 03/17/22  Yes Volney American, PA-C  ?albuterol (VENTOLIN HFA) 108 (90 Base) MCG/ACT inhaler Inhale 2 puffs into the lungs every 4 (four) hours as  needed for wheezing or shortness of breath.  12/22/19   [provider]  ?Cholecalciferol (VITAMIN D3) 125 MCG (5000 UT) CAPS Take 5,000 Units by mouth daily.     [provider]  ?dicyclomine (BENTYL) 10 MG capsule TAKE 1 CAPSULE (10 MG TOTAL) BY MOUTH 2 (TWO) TIMES DAILY AS NEEDED FOR SPASMS. 01/31/22   Rehman, Mechele Dawley, MD  ?gabapentin (NEURONTIN) 400 MG capsule Take 1 capsule (400 mg total) by mouth at bedtime. 12/25/16   Setzer, Rona Ravens, NP  ?ibuprofen (ADVIL) 200 MG tablet Take 400-600 mg by mouth every 6 (six) hours as needed for moderate pain.    [provider]  ?  olmesartan (BENICAR) 5 MG tablet Take 5 mg by mouth daily. 12/21/21   [provider]  ?ondansetron (ZOFRAN-ODT) 8 MG disintegrating tablet DISSOLVE 1 TABLET ON THE TONGUE EVERY 8 HOURS AS NEEDED FOR NAUSEA AND VOMITING 07/25/20   Montez Morita, Quillian Quince, MD  ?pantoprazole (PROTONIX) 40 MG tablet TAKE 1 TABLET BY MOUTH EVERY DAY 01/15/22   Rehman, Mechele Dawley, MD  ?sucralfate (CARAFATE) 1 g tablet TAKE 1 TABLET (1 G TOTAL) BY MOUTH 4 TIMES A DAY WITH MEALS AND AT BEDTIME 01/31/22   Rogene Houston, MD  ? ? ?Family History ?Family History  ?Problem Relation Age of Onset  ? Lung cancer Mother   ? Cervical cancer Mother   ? Heart disease Father   ? Leukemia Father   ? Cancer Maternal Grandmother   ?     bladder and colon cancer  ? ? ?Social History ?Social History  ? ?Tobacco Use  ? Smoking status: Never  ? Smokeless tobacco: Never  ?Vaping Use  ? Vaping Use: Never used  ?Substance Use Topics  ? Alcohol use: No  ? Drug use: No  ? ? ? ?Allergies   ?Imitrex [sumatriptan], Cymbalta [duloxetine hcl], Latex, Propoxyphene n-acetaminophen, and Tramadol ? ? ?Review of Systems ?Review of Systems ?Per HPI ? ?Physical Exam ?Triage Vital Signs ?ED Triage Vitals  ?Enc Vitals Group  ?   BP 03/17/22 0831 116/82  ?   Pulse Rate 03/17/22 0831 96  ?   Resp 03/17/22 0831 20  ?   Temp 03/17/22 0831 98.2 ?F (36.8 ?C)  ?   Temp Source 03/17/22  0831 Oral  ?   SpO2 03/17/22 0831 94 %  ?   Weight --   ?   Height --   ?   Head Circumference --   ?   Peak Flow --   ?   Pain Score 03/17/22 0833 5  ?   Pain Loc --   ?   Pain Edu? --   ?   Excl. in [place]? --   ?

## 2022-04-27 ENCOUNTER — Encounter (INDEPENDENT_AMBULATORY_CARE_PROVIDER_SITE_OTHER): Payer: Self-pay

## 2022-05-01 ENCOUNTER — Encounter (INDEPENDENT_AMBULATORY_CARE_PROVIDER_SITE_OTHER): Payer: 59 | Admitting: Ophthalmology

## 2022-05-22 ENCOUNTER — Encounter (INDEPENDENT_AMBULATORY_CARE_PROVIDER_SITE_OTHER): Payer: 59 | Admitting: Ophthalmology

## 2022-05-22 ENCOUNTER — Ambulatory Visit (INDEPENDENT_AMBULATORY_CARE_PROVIDER_SITE_OTHER): Payer: 59 | Admitting: Ophthalmology

## 2022-05-22 ENCOUNTER — Encounter (INDEPENDENT_AMBULATORY_CARE_PROVIDER_SITE_OTHER): Payer: Self-pay | Admitting: Ophthalmology

## 2022-05-22 DIAGNOSIS — H2513 Age-related nuclear cataract, bilateral: Secondary | ICD-10-CM | POA: Diagnosis not present

## 2022-05-22 DIAGNOSIS — H33322 Round hole, left eye: Secondary | ICD-10-CM

## 2022-05-22 NOTE — Assessment & Plan Note (Signed)
OS, with small atrophic hole inferotemporal.  I will recommend left eye have laser retinopexy prophylactic to prevent and minimize the risk of retinal detachment developing or extending in the left eye.

## 2022-05-22 NOTE — Progress Notes (Signed)
05/22/2022     CHIEF COMPLAINT Patient presents for  Chief Complaint  Patient presents with   Retina Evaluation      HISTORY OF PRESENT ILLNESS: [name] is a 56 y.o. female who presents to the clinic today for:  Evaluation of retinal break left eye as found by Dr. Collene Gobble of [facility]   HPI     Retina Evaluation           Laterality: left eye   Associated Symptoms: Floaters.  Negative for Flashes, Distortion, Blind Spot, Pain, Redness, Photophobia, Glare, Trauma, Scalp Tenderness, Jaw Claudication, Shoulder/Hip pain, Fever, Weight Loss and Fatigue         Comments   NP- Vitreous tuft possible break, new floaters-OS-Ref by [facility]. Pt stated, "I noticed there would be little black spots floating around in my vision. I thought they were flies at first. But then I heard they were called floaters. I bumped by head yesterday at work. I started feeling dizzy and out of focus. This morning I kept rubbing my eyes to get them to focus." Pt stated after drop and pictures, vision is a little cloudy. Pt mentioned that a couple of years ago, she was admitted into the hospital due to vision loss. Pt stated that her eyes felt like they were dilated and could not see out of her right eye.        Last edited by Hurman Horn, MD on 05/22/2022  3:31 PM.      Referring physician: Leticia Clas, [place]. [address] [place],  [place] [address]  HISTORICAL INFORMATION:   Selected notes from the [facility]: No current outpatient medications on file. (Ophthalmic Drugs)   No current facility-administered medications for this visit. (Ophthalmic Drugs)   Current Outpatient Medications (Other)  Medication Sig   albuterol (VENTOLIN HFA) 108 (90 Base) MCG/ACT inhaler Inhale 2 puffs into the lungs every 4 (four) hours as needed for wheezing or shortness of breath.    cetirizine (ZYRTEC ALLERGY) 10 MG tablet Take 1  tablet (10 mg total) by mouth daily.   Cholecalciferol (VITAMIN D3) 125 MCG (5000 UT) CAPS Take 5,000 Units by mouth daily.    dicyclomine (BENTYL) 10 MG capsule TAKE 1 CAPSULE (10 MG TOTAL) BY MOUTH 2 (TWO) TIMES DAILY AS NEEDED FOR SPASMS.   fluticasone (FLONASE) 50 MCG/ACT nasal spray Place 1 spray into both nostrils 2 (two) times daily.   gabapentin (NEURONTIN) 400 MG capsule Take 1 capsule (400 mg total) by mouth at bedtime.   ibuprofen (ADVIL) 200 MG tablet Take 400-600 mg by mouth every 6 (six) hours as needed for moderate pain.   olmesartan (BENICAR) 5 MG tablet Take 5 mg by mouth daily.   ondansetron (ZOFRAN-ODT) 8 MG disintegrating tablet DISSOLVE 1 TABLET ON THE TONGUE EVERY 8 HOURS AS NEEDED FOR NAUSEA AND VOMITING   pantoprazole (PROTONIX) 40 MG tablet TAKE 1 TABLET BY MOUTH EVERY DAY   predniSONE (DELTASONE) 20 MG tablet Take 2 tablets (40 mg total) by mouth daily with breakfast.   promethazine-dextromethorphan (PROMETHAZINE-DM) 6.25-15 MG/5ML syrup Take 5 mLs by mouth 4 (four) times daily as needed.   sucralfate (CARAFATE) 1 g tablet TAKE 1 TABLET (1 G TOTAL) BY MOUTH 4 TIMES A DAY WITH MEALS AND AT BEDTIME   No current facility-administered medications for this visit. (Other)      REVIEW OF SYSTEMS:  ROS   Negative for: Constitutional, Gastrointestinal, Neurological, Skin, Genitourinary, Musculoskeletal, HENT, Endocrine, Cardiovascular, Eyes, Respiratory, Psychiatric, Allergic/Imm, Heme/Lymph Last edited by Silvestre Moment on 05/22/2022  3:08 PM.       ALLERGIES Allergies  Allergen Reactions   Imitrex [Sumatriptan]     Causes a migraine   Cymbalta [Duloxetine Hcl] Rash   Latex Rash   Propoxyphene N-Acetaminophen Nausea And Vomiting and Rash   Tramadol Rash    PAST MEDICAL HISTORY Past Medical History:  Diagnosis Date   Arthritis    Asthma    triggered by weather changes; no inhaler   Cancer ([place])    Cervical   Collagen vascular disease ([place])    Fibromyalgia     Ganglion cyst of wrist 12/2012   left dorsal cyst   GERD (gastroesophageal reflux disease)    TUMS as needed   Headache(784.0)    tension/migraines   Hypercholesteremia    Hypertension    PONV (postoperative nausea and vomiting)    also states stopped breathing after hernia surgery   Rectal bleeding    Rheumatoid arthritis (HCC)    Stenosing tenosynovitis of finger 12/2012   left middle finger   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 [facility])    Past Surgical History:  Procedure Laterality Date   [place]   BIOPSY  12/01/2018   Procedure: BIOPSY;  Surgeon: Rogene Houston, MD;  [facility];  Service: Endoscopy;;  gastric    BIOPSY N/A 10/16/2019   Procedure: DUODENUM and JEJUNUM BIOPSY;  Surgeon: Rogene Houston, MD;  [facility];  Service: Endoscopy;  Laterality: N/A;   COLONOSCOPY N/A 05/08/2013   Procedure: COLONOSCOPY;  Surgeon: Rogene Houston, MD;  [facility];  Service: Endoscopy;  Laterality: N/A;  240   COLONOSCOPY N/A 09/23/2015   Procedure: COLONOSCOPY;  Surgeon: Rogene Houston, MD;  [facility];  Service: Endoscopy;  Laterality: N/A;  825   COLONOSCOPY WITH PROPOFOL N/A 09/14/2020   Procedure: COLONOSCOPY WITH PROPOFOL;  Surgeon: Rogene Houston, MD;  [facility];  Service: Endoscopy;  Laterality: N/A;  [place] AND BIOPSY  04/01/2007   ECTOPIC PREGNANCY SURGERY  1995   left tube and ovary removed   ENTEROSCOPY N/A 10/16/2019   Procedure: PUSH ENTEROSCOPY;  Surgeon: Rogene Houston, MD;  [facility];  Service: Endoscopy;  Laterality: N/A;   ESOPHAGOGASTRODUODENOSCOPY N/A 12/01/2018   Procedure: ESOPHAGOGASTRODUODENOSCOPY (EGD);  Surgeon: Rogene Houston, MD;  [facility];  Service: Endoscopy;  Laterality: N/A;  12:45   ESOPHAGOGASTRODUODENOSCOPY (EGD) WITH PROPOFOL N/A 10/16/2019   Procedure: ESOPHAGOGASTRODUODENOSCOPY  (EGD) WITH PROPOFOL;  Surgeon: Rogene Houston, MD;  [facility];  Service: Endoscopy;  Laterality: N/A;  1430pm   GANGLION CYST EXCISION  12/17/2012   Procedure: REMOVAL GANGLION OF WRIST;  Surgeon: Wynonia Sours, MD;  Location: [place];  Service: Orthopedics;  Laterality: Left;   HARDWARE REMOVAL Right 08/09/2014   Procedure: Placement Right Ankle Tight Rope, Removal Plate and Screws;  Surgeon: Marybelle Killings, MD;  Location: [place];  Service: Orthopedics;  Laterality: Right;   POLYPECTOMY  09/14/2020   Procedure: POLYPECTOMY;  Surgeon: Rogene Houston, MD;  [facility];  Service: Endoscopy;;   PUBOVAGINAL SLING  2010   rt ankle surgery     SALPINGOOPHORECTOMY  1996   right  TRIGGER FINGER RELEASE  12/17/2012   Procedure: RELEASE TRIGGER FINGER/A-1 PULLEY;  Surgeon: Wynonia Sours, MD;  Location: [place];  Service: Orthopedics;  Laterality: Left;   TUBAL LIGATION  1992   VAGINA RECONSTRUCTION SURGERY     x 5 since 2010   VENTRAL HERNIA REPAIR  09/06/2006    FAMILY HISTORY Family History  Problem Relation Age of Onset   Lung cancer Mother    Cervical cancer Mother    Heart disease Father    Leukemia Father    Cancer Maternal Grandmother        bladder and colon cancer    SOCIAL HISTORY Social History   Tobacco Use   Smoking status: Never   Smokeless tobacco: Never  Vaping Use   Vaping Use: Never used  Substance Use Topics   Alcohol use: No   Drug use: No         OPHTHALMIC EXAM:  Base Eye Exam     Visual Acuity (ETDRS)       Right Left   Dist cc 20/20 20/20    Correction: Glasses         Tonometry (Tonopen, 3:13 PM)       Right Left   Pressure 18 19         Pupils       Pupils Dark Light Shape React APD   Right PERRL 5 4 Round Brisk None   Left PERRL 5 4 Round Brisk None         Visual Fields       Left Right    Full Full         Extraocular Movement       Right Left    Full Full          Neuro/Psych     Oriented x3: Yes         Dilation     Both eyes: 1.0% Mydriacyl, 2.5% Phenylephrine @ 3:13 PM           Slit Lamp and Fundus Exam     External Exam       Right Left   External Normal Normal         Slit Lamp Exam       Right Left   Lids/Lashes Normal Normal   Conjunctiva/Sclera White and quiet White and quiet   Cornea Clear Clear   Anterior Chamber Deep and quiet Deep and quiet   Iris Round and reactive Round and reactive   Lens Trace Nuclear sclerosis Trace Nuclear sclerosis   Anterior Vitreous Normal Normal         Fundus Exam       Right Left   Posterior Vitreous Normal Normal   Disc Normal Normal   C/D Ratio 0.45 0.4   Macula Normal Normal   Vessels Normal Normal   Periphery Normal Vitreoretinal tuft with a small atrophic hole centered at 5:00 meridian just anterior to the equator inferotemporal            IMAGING AND PROCEDURES  Imaging and Procedures for 05/22/22  OCT, Retina - OU - Both Eyes       Right Eye Quality was good. Scan locations included subfoveal. Central Foveal Thickness: 288. Progression has no prior data. Findings include normal foveal contour.   Left Eye Quality was good. Scan locations included subfoveal. Central Foveal Thickness: 287. Progression has no prior data. Findings include normal foveal contour.  Color Fundus Photography Optos - OU - Both Eyes       Right Eye Progression has no prior data. Disc findings include normal observations. Macula : normal observations. Vessels : normal observations. Periphery : normal observations.   Left Eye Progression has no prior data. Disc findings include normal observations. Macula : normal observations. Vessels : normal observations. Periphery : normal observations.   Notes Pigmented tuft inferotemporal visible on color fundus photography OS             ASSESSMENT/PLAN:  Retinal hole, left OS, with small atrophic hole  inferotemporal.  I will recommend left eye have laser retinopexy prophylactic to prevent and minimize the risk of retinal detachment developing or extending in the left eye.  Nuclear sclerotic cataract of both eyes Mild not visually significant follow-up with family eye doctor     ICD-10-CM   1. Retinal hole, left  H33.322 OCT, Retina - OU - Both Eyes    Color Fundus Photography Optos - OU - Both Eyes    2. Nuclear sclerotic cataract of both eyes  H25.13       1.  OS with small retinal hole, will deliver laser retinopexy in the next week post precertification  2.  Mild NSC changes.  Follow-up Dr. Collene Gobble as scheduled  3.  Ophthalmic Meds Ordered this visit:  No orders of the defined types were placed in this encounter.      Return in about 1 week (around 05/29/2022) for dilate, OS, RETINOPEXY small hole inferotemporal.  There are no Patient Instructions on file for this visit.   Explained the diagnoses, plan, and follow up with the patient and they expressed understanding.  Patient expressed understanding of the importance of proper follow up care.   Clent Demark [place] Nase M.D. Diseases & Surgery of the Retina and Vitreous Retina & Diabetic [place] 05/22/22     Abbreviations: M myopia (nearsighted); A astigmatism; H hyperopia (farsighted); P presbyopia; Mrx spectacle prescription;  CTL contact lenses; OD right eye; OS left eye; OU both eyes  XT exotropia; ET esotropia; PEK punctate epithelial keratitis; PEE punctate epithelial erosions; DES dry eye syndrome; MGD meibomian gland dysfunction; ATs artificial tears; PFAT's preservative free artificial tears; [place] nuclear sclerotic cataract; PSC posterior subcapsular cataract; ERM epi-retinal membrane; PVD posterior vitreous detachment; RD retinal detachment; DM diabetes mellitus; DR diabetic retinopathy; NPDR non-proliferative diabetic retinopathy; PDR proliferative diabetic retinopathy; CSME clinically significant macular edema;  DME diabetic macular edema; dbh dot blot hemorrhages; CWS cotton wool spot; POAG primary open angle glaucoma; C/D cup-to-disc ratio; HVF humphrey visual field; GVF goldmann visual field; OCT optical coherence tomography; IOP intraocular pressure; BRVO Branch retinal vein occlusion; CRVO central retinal vein occlusion; CRAO central retinal artery occlusion; BRAO branch retinal artery occlusion; RT retinal tear; SB scleral buckle; PPV pars plana vitrectomy; VH Vitreous hemorrhage; PRP panretinal laser photocoagulation; IVK intravitreal kenalog; VMT vitreomacular traction; MH Macular hole;  NVD neovascularization of the disc; NVE neovascularization elsewhere; AREDS age related eye disease study; ARMD age related macular degeneration; POAG primary open angle glaucoma; EBMD epithelial/anterior basement membrane dystrophy; ACIOL anterior chamber intraocular lens; IOL intraocular lens; PCIOL posterior chamber intraocular lens; Phaco/IOL phacoemulsification with intraocular lens placement; [place] photorefractive keratectomy; LASIK laser assisted in situ keratomileusis; HTN hypertension; DM diabetes mellitus; COPD chronic obstructive pulmonary disease

## 2022-05-22 NOTE — Assessment & Plan Note (Signed)
Mild not visually significant follow-up with family eye doctor

## 2022-05-30 ENCOUNTER — Encounter (INDEPENDENT_AMBULATORY_CARE_PROVIDER_SITE_OTHER): Payer: 59 | Admitting: Ophthalmology

## 2022-06-07 ENCOUNTER — Encounter (INDEPENDENT_AMBULATORY_CARE_PROVIDER_SITE_OTHER): Payer: Self-pay | Admitting: Ophthalmology

## 2022-06-07 ENCOUNTER — Ambulatory Visit (INDEPENDENT_AMBULATORY_CARE_PROVIDER_SITE_OTHER): Payer: 59 | Admitting: Ophthalmology

## 2022-06-07 DIAGNOSIS — H33322 Round hole, left eye: Secondary | ICD-10-CM

## 2022-06-07 NOTE — Assessment & Plan Note (Signed)
Retinopexy left eye inferotemporal atrophic HOLE

## 2022-06-07 NOTE — Progress Notes (Signed)
06/07/2022     CHIEF COMPLAINT Patient presents for No chief complaint on file.     HISTORY OF PRESENT ILLNESS: [name] is a 56 y.o. female who presents to the clinic today for:   HPI   RETINAL HOLE OS, FOR RETINOPEXY OS TODAY Last edited by Hurman Horn, MD on 06/07/2022  3:48 PM.      Referring physician: Asencion Noble, MD [address] [place],  [place] [address]  HISTORICAL INFORMATION:   Selected notes from the [facility]: No current outpatient medications on file. (Ophthalmic Drugs)   No current facility-administered medications for this visit. (Ophthalmic Drugs)   Current Outpatient Medications (Other)  Medication Sig   albuterol (VENTOLIN HFA) 108 (90 Base) MCG/ACT inhaler Inhale 2 puffs into the lungs every 4 (four) hours as needed for wheezing or shortness of breath.    cetirizine (ZYRTEC ALLERGY) 10 MG tablet Take 1 tablet (10 mg total) by mouth daily.   Cholecalciferol (VITAMIN D3) 125 MCG (5000 UT) CAPS Take 5,000 Units by mouth daily.    dicyclomine (BENTYL) 10 MG capsule TAKE 1 CAPSULE (10 MG TOTAL) BY MOUTH 2 (TWO) TIMES DAILY AS NEEDED FOR SPASMS.   fluticasone (FLONASE) 50 MCG/ACT nasal spray Place 1 spray into both nostrils 2 (two) times daily.   gabapentin (NEURONTIN) 400 MG capsule Take 1 capsule (400 mg total) by mouth at bedtime.   ibuprofen (ADVIL) 200 MG tablet Take 400-600 mg by mouth every 6 (six) hours as needed for moderate pain.   olmesartan (BENICAR) 5 MG tablet Take 5 mg by mouth daily.   ondansetron (ZOFRAN-ODT) 8 MG disintegrating tablet DISSOLVE 1 TABLET ON THE TONGUE EVERY 8 HOURS AS NEEDED FOR NAUSEA AND VOMITING   pantoprazole (PROTONIX) 40 MG tablet TAKE 1 TABLET BY MOUTH EVERY DAY   predniSONE (DELTASONE) 20 MG tablet Take 2 tablets (40 mg total) by mouth daily with breakfast.   promethazine-dextromethorphan (PROMETHAZINE-DM) 6.25-15 MG/5ML syrup Take 5 mLs by mouth 4 (four) times  daily as needed.   sucralfate (CARAFATE) 1 g tablet TAKE 1 TABLET (1 G TOTAL) BY MOUTH 4 TIMES A DAY WITH MEALS AND AT BEDTIME   No current facility-administered medications for this visit. (Other)      REVIEW OF SYSTEMS: ROS   Negative for: Constitutional, Gastrointestinal, Neurological, Skin, Genitourinary, Musculoskeletal, HENT, Endocrine, Cardiovascular, Eyes, Respiratory, Psychiatric, Allergic/Imm, Heme/Lymph Last edited by Hurman Horn, MD on 06/07/2022  3:48 PM.       ALLERGIES Allergies  Allergen Reactions   Imitrex [Sumatriptan]     Causes a migraine   Cymbalta [Duloxetine Hcl] Rash   Latex Rash   Propoxyphene N-Acetaminophen Nausea And Vomiting and Rash   Tramadol Rash    PAST MEDICAL HISTORY Past Medical History:  Diagnosis Date   Arthritis    Asthma    triggered by weather changes; no inhaler   Cancer ([place])    Cervical   Collagen vascular disease ([place])    Fibromyalgia    Ganglion cyst of wrist 12/2012   left dorsal cyst   GERD (gastroesophageal reflux disease)    TUMS as needed   Headache(784.0)    tension/migraines   Hypercholesteremia    Hypertension    PONV (postoperative nausea and vomiting)    also states stopped breathing after hernia surgery   Rectal bleeding    Rheumatoid arthritis ([place])    Stenosing tenosynovitis of finger 12/2012  left middle finger   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 [facility])    Past Surgical History:  Procedure Laterality Date   [employer]   BIOPSY  12/01/2018   Procedure: BIOPSY;  Surgeon: Rogene Houston, MD;  [facility];  Service: Endoscopy;;  gastric    BIOPSY N/A 10/16/2019   Procedure: DUODENUM and JEJUNUM BIOPSY;  Surgeon: Rogene Houston, MD;  [facility];  Service: Endoscopy;  Laterality: N/A;   COLONOSCOPY N/A 05/08/2013   Procedure: COLONOSCOPY;  Surgeon: Rogene Houston, MD;  [facility];  Service: Endoscopy;  Laterality: N/A;   240   COLONOSCOPY N/A 09/23/2015   Procedure: COLONOSCOPY;  Surgeon: Rogene Houston, MD;  [facility];  Service: Endoscopy;  Laterality: N/A;  825   COLONOSCOPY WITH PROPOFOL N/A 09/14/2020   Procedure: COLONOSCOPY WITH PROPOFOL;  Surgeon: Rogene Houston, MD;  [facility];  Service: Endoscopy;  Laterality: N/A;  [place] AND BIOPSY  04/01/2007   ECTOPIC PREGNANCY SURGERY  1995   left tube and ovary removed   ENTEROSCOPY N/A 10/16/2019   Procedure: PUSH ENTEROSCOPY;  Surgeon: Rogene Houston, MD;  [facility];  Service: Endoscopy;  Laterality: N/A;   ESOPHAGOGASTRODUODENOSCOPY N/A 12/01/2018   Procedure: ESOPHAGOGASTRODUODENOSCOPY (EGD);  Surgeon: Rogene Houston, MD;  [facility];  Service: Endoscopy;  Laterality: N/A;  12:45   ESOPHAGOGASTRODUODENOSCOPY (EGD) WITH PROPOFOL N/A 10/16/2019   Procedure: ESOPHAGOGASTRODUODENOSCOPY (EGD) WITH PROPOFOL;  Surgeon: Rogene Houston, MD;  [facility];  Service: Endoscopy;  Laterality: N/A;  1430pm   GANGLION CYST EXCISION  12/17/2012   Procedure: REMOVAL GANGLION OF WRIST;  Surgeon: Wynonia Sours, MD;  Location: [place];  Service: Orthopedics;  Laterality: Left;   HARDWARE REMOVAL Right 08/09/2014   Procedure: Placement Right Ankle Tight Rope, Removal Plate and Screws;  Surgeon: Marybelle Killings, MD;  Location: [place];  Service: Orthopedics;  Laterality: Right;   POLYPECTOMY  09/14/2020   Procedure: POLYPECTOMY;  Surgeon: Rogene Houston, MD;  [facility];  Service: Endoscopy;;   PUBOVAGINAL SLING  2010   rt ankle surgery     SALPINGOOPHORECTOMY  1996   right   TRIGGER FINGER RELEASE  12/17/2012   Procedure: RELEASE TRIGGER FINGER/A-1 PULLEY;  Surgeon: Wynonia Sours, MD;  Location: [facility];  Service: Orthopedics;  Laterality: Left;   TUBAL LIGATION  1992   VAGINA RECONSTRUCTION SURGERY     x 5 since 2010   VENTRAL HERNIA  REPAIR  09/06/2006    FAMILY HISTORY Family History  Problem Relation Age of Onset   Lung cancer Mother    Cervical cancer Mother    Heart disease Father    Leukemia Father    Cancer Maternal Grandmother        bladder and colon cancer    SOCIAL HISTORY Social History   Tobacco Use   Smoking status: Never   Smokeless tobacco: Never  Vaping Use   Vaping Use: Never used  Substance Use Topics   Alcohol use: No   Drug use: No         OPHTHALMIC EXAM:  Base Eye Exam     Visual Acuity (ETDRS)       Right Left   Dist cc 20/20 20/20         Tonometry (Tonopen,  3:50 PM)       Right Left   Pressure 17 16         Pupils       Pupils APD   Right PERRL None   Left PERRL None         Visual Fields       Left Right    Full Full         Neuro/Psych     Oriented x3: Yes         Dilation     Left eye: 1.0% Mydriacyl, 2.5% Phenylephrine @ 3:49 PM           Slit Lamp and Fundus Exam     External Exam       Right Left   External Normal Normal         Slit Lamp Exam       Right Left   Lids/Lashes Normal Normal   Conjunctiva/Sclera White and quiet White and quiet   Cornea Clear Clear   Anterior Chamber Deep and quiet Deep and quiet   Iris Round and reactive Round and reactive   Lens Trace Nuclear sclerosis Trace Nuclear sclerosis   Anterior Vitreous Normal Normal         Fundus Exam       Right Left   Posterior Vitreous  Normal   Disc  Normal   C/D Ratio  0.4   Macula  Normal   Vessels  Normal   Periphery  Vitreoretinal tuft with a small atrophic hole centered at 5:00 meridian just anterior to the equator inferotemporal            IMAGING AND PROCEDURES  Imaging and Procedures for 06/07/22  Repair Retinal Breaks, Laser - OS - Left Eye       Tear locations include temporal, inferior.   Time Out Confirmed correct patient, procedure, site, and patient consented.   Anesthesia Topical anesthesia was used.  Anesthetic medications included Proparacaine 0.5%.   Laser Information The type of laser was diode. Color was yellow. The duration in seconds was 0.03. The spot size was 390 microns. Laser power was 260. Total spots was 68.   Post-op The patient tolerated the procedure well. There were no complications. The patient received written and verbal post procedure care education.   Notes All inferotemporally treated without difficulty              ASSESSMENT/PLAN:  Retinal hole, left Retinopexy left eye inferotemporal atrophic HOLE     ICD-10-CM   1. Retinal hole, left  H33.322 Repair Retinal Breaks, Laser - OS - Left Eye      1.  Patient retinopexy inferotemporally applied 3 rows applied around the break.  2.   NO Complications  3.  Patient to notify the office promptly if new onset flashing lights floaters profound change in vision in either eye  Ophthalmic Meds Ordered this visit:  No orders of the defined types were placed in this encounter.      Return in about 6 months (around 12/07/2022) for DILATE OU, COLOR FP.  There are no Patient Instructions on file for this visit.   Explained the diagnoses, plan, and follow up with the patient and they expressed understanding.  Patient expressed understanding of the importance of proper follow up care.   Clent Demark Vance Belcourt M.D. Diseases & Surgery of the Retina and Vitreous Retina & Diabetic [place] 06/07/22     Abbreviations: M myopia (nearsighted); A astigmatism;  H hyperopia (farsighted); P presbyopia; Mrx spectacle prescription;  CTL contact lenses; OD right eye; OS left eye; OU both eyes  XT exotropia; ET esotropia; PEK punctate epithelial keratitis; PEE punctate epithelial erosions; DES dry eye syndrome; MGD meibomian gland dysfunction; ATs artificial tears; PFAT's preservative free artificial tears; [place] nuclear sclerotic cataract; PSC posterior subcapsular cataract; ERM epi-retinal membrane; PVD posterior vitreous  detachment; RD retinal detachment; DM diabetes mellitus; DR diabetic retinopathy; NPDR non-proliferative diabetic retinopathy; PDR proliferative diabetic retinopathy; CSME clinically significant macular edema; DME diabetic macular edema; dbh dot blot hemorrhages; CWS cotton wool spot; POAG primary open angle glaucoma; C/D cup-to-disc ratio; HVF humphrey visual field; GVF goldmann visual field; OCT optical coherence tomography; IOP intraocular pressure; BRVO Branch retinal vein occlusion; CRVO central retinal vein occlusion; CRAO central retinal artery occlusion; BRAO branch retinal artery occlusion; RT retinal tear; SB scleral buckle; PPV pars plana vitrectomy; VH Vitreous hemorrhage; PRP panretinal laser photocoagulation; IVK intravitreal kenalog; VMT vitreomacular traction; MH Macular hole;  NVD neovascularization of the disc; NVE neovascularization elsewhere; AREDS age related eye disease study; ARMD age related macular degeneration; POAG primary open angle glaucoma; EBMD epithelial/anterior basement membrane dystrophy; ACIOL anterior chamber intraocular lens; IOL intraocular lens; PCIOL posterior chamber intraocular lens; Phaco/IOL phacoemulsification with intraocular lens placement; [place] photorefractive keratectomy; LASIK laser assisted in situ keratomileusis; HTN hypertension; DM diabetes mellitus; COPD chronic obstructive pulmonary disease

## 2022-06-30 ENCOUNTER — Other Ambulatory Visit (INDEPENDENT_AMBULATORY_CARE_PROVIDER_SITE_OTHER): Payer: Self-pay | Admitting: Internal Medicine

## 2022-08-16 ENCOUNTER — Other Ambulatory Visit: Payer: Self-pay | Admitting: Internal Medicine

## 2022-08-16 ENCOUNTER — Other Ambulatory Visit (HOSPITAL_COMMUNITY): Payer: Self-pay | Admitting: Internal Medicine

## 2022-08-16 DIAGNOSIS — R7989 Other specified abnormal findings of blood chemistry: Secondary | ICD-10-CM

## 2022-08-27 ENCOUNTER — Ambulatory Visit (HOSPITAL_COMMUNITY)
Admission: RE | Admit: 2022-08-27 | Discharge: 2022-08-27 | Disposition: A | Discharge status: Home / Self Care | Payer: 59 | Source: Ambulatory Visit | Attending: Internal Medicine | Admitting: Internal Medicine

## 2022-08-27 DIAGNOSIS — R7989 Other specified abnormal findings of blood chemistry: Secondary | ICD-10-CM | POA: Insufficient documentation

## 2022-12-06 ENCOUNTER — Encounter (INDEPENDENT_AMBULATORY_CARE_PROVIDER_SITE_OTHER): Payer: 59 | Admitting: Ophthalmology

## 2022-12-17 ENCOUNTER — Ambulatory Visit
Admission: RE | Admit: 2022-12-17 | Discharge: 2022-12-17 | Disposition: A | Discharge status: Home / Self Care | Payer: 59 | Source: Ambulatory Visit | Attending: Nurse Practitioner | Admitting: Nurse Practitioner

## 2022-12-17 VITALS — BP 135/78 | HR 78 | Temp 98.1°F | Resp 18

## 2022-12-17 DIAGNOSIS — J029 Acute pharyngitis, unspecified: Secondary | ICD-10-CM | POA: Diagnosis not present

## 2022-12-17 DIAGNOSIS — J329 Chronic sinusitis, unspecified: Secondary | ICD-10-CM

## 2022-12-17 DIAGNOSIS — R21 Rash and other nonspecific skin eruption: Secondary | ICD-10-CM | POA: Diagnosis not present

## 2022-12-17 DIAGNOSIS — B9789 Other viral agents as the cause of diseases classified elsewhere: Secondary | ICD-10-CM

## 2022-12-17 MED ORDER — TRIAMCINOLONE ACETONIDE 0.1 % EX OINT: 1.0000 | TOPICAL_OINTMENT | Freq: Two times a day (BID) | CUTANEOUS | 0 refills | Status: DC

## 2022-12-17 NOTE — ED Provider Notes (Signed)
[facility]    CSN: [id number] Arrival date & time: 12/17/22  1017      History   Chief Complaint Chief Complaint  Patient presents with   Sore Throat    Entered by patient    HPI [name] is a 57 y.o. female.   Patient presents today for 4-day history of sore throat.  Also endorses body aches, nasal congestion, runny nose, postnasal drainage, sore throat, right-sided sinus pressure, headache, nausea without vomiting, decreased appetite, and fatigue.  Patient denies cough, shortness of breath or chest pain, ear pain, abdominal pain, diarrhea, and loss of taste or smell.  No known sick contacts.  Reports when symptoms started, she was at an ice exhibit in [place].  Has been taking ibuprofen, DayQuil, and Tylenol for symptoms which helped temporarily.  Patient is also concerned about rash to right lower leg that has been present for the past 4 to 5 months.  Reports the rash is getting bigger.  It is itchy and red.  No oozing, bleeding, or swelling around the rash.  Has been trying Vaseline twice daily which helps minimally.  Has been putting a Band-Aid over the rash.      Past Medical History:  Diagnosis Date   Arthritis    Asthma    triggered by weather changes; no inhaler   Cancer ([place])    Cervical   Collagen vascular disease ([place])    Fibromyalgia    Ganglion cyst of wrist 12/2012   left dorsal cyst   GERD (gastroesophageal reflux disease)    TUMS as needed   Headache(784.0)    tension/migraines   Hypercholesteremia    Hypertension    PONV (postoperative nausea and vomiting)    also states stopped breathing after hernia surgery   Rectal bleeding    Rheumatoid arthritis (HCC)    Stenosing tenosynovitis of finger 12/2012   left middle finger   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 [facility])     Patient Active Problem List   Diagnosis Date Noted   Retinal hole, left 05/22/2022   Nuclear sclerotic cataract of both eyes 05/22/2022   Diarrhea 01/04/2022    Vitamin B 12 deficiency 05/19/2021   Tired 04/14/2021   Anxiety and depression 02/17/2021   Encounter for screening fecal occult blood testing 02/17/2021   Encounter for well woman exam with routine gynecological exam 02/17/2021   Current use of estrogen therapy 02/17/2021   Urine frequency 04/13/2020   Nausea and vomiting 10/08/2019   CRP elevated 10/05/2019   Noninfectious jejunitis 06/29/2019   Anemia 06/29/2019   Anemia due to vitamin B12 deficiency    Nausea    Gastroesophageal reflux disease 10/23/2018   Abdominal pain, epigastric 10/23/2018   Chest pain 10/16/2018   Stiffness of joint, not elsewhere classified, ankle and foot 09/07/2014   Pain in joint, ankle and foot 09/07/2014   Difficulty in walking(719.7) 09/07/2014   Right ankle instability 08/09/2014   Hematochezia 08/11/2013   Constipation 08/11/2013   RHEUMATOID ARTHRITIS 02/20/2010   KNEE PAIN 02/20/2010   FIBROMYALGIA 02/20/2010    Past Surgical History:  Procedure Laterality Date   ABDOMINAL HYSTERECTOMY  [place]   BIOPSY  12/01/2018   Procedure: BIOPSY;  Surgeon: Rogene Houston, MD;  [facility];  Service: Endoscopy;;  gastric    BIOPSY N/A 10/16/2019   Procedure: DUODENUM and JEJUNUM BIOPSY;  Surgeon: Rogene Houston, MD;  [facility];  Service: Endoscopy;  Laterality: N/A;   COLONOSCOPY N/A 05/08/2013   Procedure: COLONOSCOPY;  Surgeon: Rogene Houston, MD;  [facility];  Service: Endoscopy;  Laterality: N/A;  240   COLONOSCOPY N/A 09/23/2015   Procedure: COLONOSCOPY;  Surgeon: Rogene Houston, MD;  [facility];  Service: Endoscopy;  Laterality: N/A;  825   COLONOSCOPY WITH PROPOFOL N/A 09/14/2020   Procedure: COLONOSCOPY WITH PROPOFOL;  Surgeon: Rogene Houston, MD;  [facility];  Service: Endoscopy;  Laterality: N/A;  [place] AND BIOPSY  04/01/2007   ECTOPIC PREGNANCY SURGERY  1995   left  tube and ovary removed   ENTEROSCOPY N/A 10/16/2019   Procedure: PUSH ENTEROSCOPY;  Surgeon: Rogene Houston, MD;  [facility];  Service: Endoscopy;  Laterality: N/A;   ESOPHAGOGASTRODUODENOSCOPY N/A 12/01/2018   Procedure: ESOPHAGOGASTRODUODENOSCOPY (EGD);  Surgeon: Rogene Houston, MD;  [facility];  Service: Endoscopy;  Laterality: N/A;  12:45   ESOPHAGOGASTRODUODENOSCOPY (EGD) WITH PROPOFOL N/A 10/16/2019   Procedure: ESOPHAGOGASTRODUODENOSCOPY (EGD) WITH PROPOFOL;  Surgeon: Rogene Houston, MD;  [facility];  Service: Endoscopy;  Laterality: N/A;  1430pm   GANGLION CYST EXCISION  12/17/2012   Procedure: REMOVAL GANGLION OF WRIST;  Surgeon: Wynonia Sours, MD;  Location: [place];  Service: Orthopedics;  Laterality: Left;   HARDWARE REMOVAL Right 08/09/2014   Procedure: Placement Right Ankle Tight Rope, Removal Plate and Screws;  Surgeon: Marybelle Killings, MD;  Location: [place];  Service: Orthopedics;  Laterality: Right;   POLYPECTOMY  09/14/2020   Procedure: POLYPECTOMY;  Surgeon: Rogene Houston, MD;  [facility];  Service: Endoscopy;;   PUBOVAGINAL SLING  2010   rt ankle surgery     SALPINGOOPHORECTOMY  1996   right   TRIGGER FINGER RELEASE  12/17/2012   Procedure: RELEASE TRIGGER FINGER/A-1 PULLEY;  Surgeon: Wynonia Sours, MD;  Location: [place];  Service: Orthopedics;  Laterality: Left;   TUBAL LIGATION  1992   VAGINA RECONSTRUCTION SURGERY     x 5 since 2010   [place]  09/06/2006    OB History     Gravida  3   Para  2   Term  2   Preterm  0   AB  1   Living  2      SAB      IAB      Ectopic  1   Multiple      Live Births  2            Home Medications    Prior to Admission medications   Medication Sig Start Date End Date Taking? Authorizing Provider  albuterol (VENTOLIN HFA) 108 (90 Base) MCG/ACT inhaler Inhale 2 puffs into the lungs every 4 (four) hours as needed  for wheezing or shortness of breath.  12/22/19  Yes [provider]  Cholecalciferol (VITAMIN D3) 125 MCG (5000 UT) CAPS Take 5,000 Units by mouth daily.    Yes [provider]  dicyclomine (BENTYL) 10 MG capsule TAKE 1 CAPSULE (10 MG TOTAL) BY MOUTH 2 (TWO) TIMES DAILY AS NEEDED FOR SPASMS. 07/02/22  Yes Carlan, Chelsea L, NP  gabapentin (NEURONTIN) 400 MG capsule Take 1 capsule (400 mg total) by mouth at bedtime. 12/25/16  Yes Setzer, Terri L, NP  ibuprofen (ADVIL) 200 MG tablet Take 400-600 mg by mouth every 6 (six) hours as needed for moderate  pain.   Yes [provider]  olmesartan (BENICAR) 5 MG tablet Take 5 mg by mouth daily. 12/21/21  Yes [provider]  ondansetron (ZOFRAN-ODT) 8 MG disintegrating tablet DISSOLVE 1 TABLET ON THE TONGUE EVERY 8 HOURS AS NEEDED FOR NAUSEA AND VOMITING 07/25/20  Yes Montez Morita, Quillian Quince, MD  pantoprazole (PROTONIX) 40 MG tablet TAKE 1 TABLET BY MOUTH EVERY DAY 01/15/22  Yes Rehman, Mechele Dawley, MD  sucralfate (CARAFATE) 1 g tablet TAKE 1 TABLET (1 G TOTAL) BY MOUTH 4 TIMES A DAY WITH MEALS AND AT BEDTIME 01/31/22  Yes Rehman, Mechele Dawley, MD  triamcinolone ointment (KENALOG) 0.1 % Apply 1 Application topically 2 (two) times daily. 12/17/22  Yes Eulogio Bear, NP  cetirizine (ZYRTEC ALLERGY) 10 MG tablet Take 1 tablet (10 mg total) by mouth daily. 03/17/22   Volney American, PA-C  fluticasone [facility]) 50 MCG/ACT nasal spray Place 1 spray into both nostrils 2 (two) times daily. 03/17/22   Volney American, PA-C  predniSONE (DELTASONE) 20 MG tablet Take 2 tablets (40 mg total) by mouth daily with breakfast. 03/17/22   Volney American, PA-C    Family History Family History  Problem Relation Age of Onset   Lung cancer Mother    Cervical cancer Mother    Heart disease Father    Leukemia Father    Cancer Maternal Grandmother        bladder and colon cancer    Social History Social History   Tobacco Use    Smoking status: Never   Smokeless tobacco: Never  Vaping Use   Vaping Use: Never used  Substance Use Topics   Alcohol use: No   Drug use: No     Allergies   Imitrex [sumatriptan], Cymbalta [duloxetine hcl], Latex, Propoxyphene n-acetaminophen, and Tramadol   Review of Systems Review of Systems Per HPI  Physical Exam Triage Vital Signs ED Triage Vitals [12/17/22 1039]  Enc Vitals Group     BP 135/78     Pulse Rate 78     Resp 18     Temp 98.1 F (36.7 C)     Temp Source Oral     SpO2 94 %     Weight      Height      Head Circumference      Peak Flow      Pain Score 5     Pain Loc      Pain Edu?      Excl. in [place]?    No data found.  Updated Vital Signs BP 135/78 (BP Location: Right Arm)   Pulse 78   Temp 98.1 F (36.7 C) (Oral)   Resp 18   SpO2 94%   Visual Acuity Right Eye Distance:   Left Eye Distance:   Bilateral Distance:    Right Eye Near:   Left Eye Near:    Bilateral Near:     Physical Exam Vitals and nursing note reviewed.  Constitutional:      General: She is not in acute distress.    Appearance: Normal appearance. She is not ill-appearing or toxic-appearing.  HENT:     Head: Normocephalic and atraumatic.     Right Ear: Tympanic membrane, ear canal and external ear normal. No drainage, swelling or tenderness. No middle ear effusion. Tympanic membrane is not erythematous.     Left Ear: Tympanic membrane, ear canal and external ear normal. No drainage, swelling or tenderness.  No middle ear effusion. Tympanic membrane  is not erythematous.     Nose: No congestion or rhinorrhea.     Mouth/Throat:     Mouth: Mucous membranes are moist.     Pharynx: Oropharynx is clear. No oropharyngeal exudate or posterior oropharyngeal erythema.     Tonsils: No tonsillar exudate. 0 on the right. 0 on the left.  Eyes:     General: No scleral icterus.    Extraocular Movements: Extraocular movements intact.  Cardiovascular:     Rate and Rhythm: Normal rate and  regular rhythm.  Pulmonary:     Effort: Pulmonary effort is normal. No respiratory distress.     Breath sounds: Normal breath sounds. No wheezing, rhonchi or rales.  Abdominal:     General: Abdomen is flat. Bowel sounds are normal. There is no distension.     Palpations: Abdomen is soft.  Musculoskeletal:     Cervical back: Normal range of motion and neck supple.  Lymphadenopathy:     Cervical: No cervical adenopathy.  Skin:    General: Skin is warm and dry.     Capillary Refill: Capillary refill takes less than 2 seconds.     Coloration: Skin is not jaundiced or pale.     Findings: Erythema and rash present.          Comments: Slightly erythematous plaque to right lower extremity and approximately area marked.  Area is approximately 4 cm x 6 cm.  No central clearing, fluctuance, active drainage.  Neurological:     Mental Status: She is alert and oriented to person, place, and time.     Motor: No weakness.  Psychiatric:        Behavior: Behavior is cooperative.      UC Treatments / Results  Labs (all labs ordered are listed, but only abnormal results are displayed) Labs Reviewed - No data to display  EKG   Radiology No results found.  Procedures Procedures (including critical care time)  Medications Ordered in UC Medications - No data to display  Initial Impression / Assessment and Plan / UC Course  I have reviewed the triage vital signs and the nursing notes.  Pertinent labs & imaging results that were available during my care of the patient were reviewed by me and considered in my medical decision making (see chart for details).   Patient is well-appearing, normotensive, afebrile, not tachycardic, not tachypneic, oxygenating well on room air.    Viral sinusitis Viral pharyngitis Suspect viral etiology COVID-19 testing declined by patient Supportive care discussed Return and ER precautions discussed Note given for work  Rash and nonspecific skin  eruption Unclear etiology Will start triamcinolone ointment to help with itching, place emollient over top Follow-up with PCP or dermatologist with no improvement in symptoms despite treatment  The patient was given the opportunity to ask questions.  All questions answered to their satisfaction.  The patient is in agreement to this plan.    Final Clinical Impressions(s) / UC Diagnoses   Final diagnoses:  Viral sinusitis  Viral pharyngitis  Rash and nonspecific skin eruption     Discharge Instructions      I suspect you have a viral upper respiratory infection.  This can commonly cause a sinus infection as well as a sore throat you are having.  This will hopefully improve towards the end of this week.  Please seek care if your symptoms persist or worsen for more than 1 week to 10 days without improvement.  Some things that can make you feel better are: -  Increased rest - Increasing fluid with water/sugar free electrolytes - Acetaminophen and ibuprofen as needed for fever/pain - Salt water gargling, chloraseptic spray and throat lozenges for sore throat - OTC guaifenesin (Mucinex) 600 mg twice daily to break up congestion - Saline sinus flushes or a neti pot to break up congestion - Humidifying the air     ED Prescriptions     Medication Sig Dispense Auth. Provider   triamcinolone ointment (KENALOG) 0.1 % Apply 1 Application topically 2 (two) times daily. 30 g Eulogio Bear, NP      PDMP not reviewed this encounter.   Eulogio Bear, NP 12/17/22 [phone]

## 2022-12-17 NOTE — Discharge Instructions (Addendum)
I suspect you have a viral upper respiratory infection.  This can commonly cause a sinus infection as well as a sore throat you are having.  This will hopefully improve towards the end of this week.  Please seek care if your symptoms persist or worsen for more than 1 week to 10 days without improvement.  Some things that can make you feel better are: - Increased rest - Increasing fluid with water/sugar free electrolytes - Acetaminophen and ibuprofen as needed for fever/pain - Salt water gargling, chloraseptic spray and throat lozenges for sore throat - OTC guaifenesin (Mucinex) 600 mg twice daily to break up congestion - Saline sinus flushes or a neti pot to break up congestion - Humidifying the air

## 2022-12-17 NOTE — ED Triage Notes (Signed)
Sore throat that started Thursday. Taking Nyquil, tylenol, ibuprofen.

## 2022-12-26 ENCOUNTER — Other Ambulatory Visit (INDEPENDENT_AMBULATORY_CARE_PROVIDER_SITE_OTHER): Payer: Self-pay | Admitting: Gastroenterology

## 2022-12-27 ENCOUNTER — Other Ambulatory Visit (INDEPENDENT_AMBULATORY_CARE_PROVIDER_SITE_OTHER): Payer: Self-pay | Admitting: Internal Medicine

## 2023-01-28 ENCOUNTER — Other Ambulatory Visit (HOSPITAL_COMMUNITY): Payer: Self-pay | Admitting: Obstetrics & Gynecology

## 2023-01-28 DIAGNOSIS — Z1231 Encounter for screening mammogram for malignant neoplasm of breast: Secondary | ICD-10-CM

## 2023-01-31 ENCOUNTER — Encounter (INDEPENDENT_AMBULATORY_CARE_PROVIDER_SITE_OTHER): Payer: Self-pay | Admitting: Gastroenterology

## 2023-01-31 ENCOUNTER — Ambulatory Visit (INDEPENDENT_AMBULATORY_CARE_PROVIDER_SITE_OTHER): Payer: 59 | Admitting: Gastroenterology

## 2023-01-31 ENCOUNTER — Encounter (INDEPENDENT_AMBULATORY_CARE_PROVIDER_SITE_OTHER): Payer: Self-pay | Admitting: *Deleted

## 2023-01-31 VITALS — BP 105/72 | HR 90 | Temp 97.8°F | Ht 63.0 in | Wt 192.6 lb

## 2023-01-31 DIAGNOSIS — R112 Nausea with vomiting, unspecified: Secondary | ICD-10-CM

## 2023-01-31 DIAGNOSIS — R1013 Epigastric pain: Secondary | ICD-10-CM | POA: Diagnosis not present

## 2023-01-31 MED ORDER — ONDANSETRON 4 MG PO TBDP: 4.0000 mg | ORAL_TABLET | Freq: Three times a day (TID) | ORAL | 1 refills | Status: DC | PRN

## 2023-01-31 MED ORDER — PANTOPRAZOLE SODIUM 40 MG PO TBEC: 40.0000 mg | DELAYED_RELEASE_TABLET | Freq: Every day | ORAL | 3 refills | Status: DC

## 2023-01-31 NOTE — Progress Notes (Addendum)
Referring Provider: Asencion Noble, MD Primary Care Physician:  Asencion Noble, MD Primary GI Physician:   Chief Complaint  Patient presents with   Abdominal Pain    Patient here today due to issues with epigastric pain. She has nausea and vomiting on going for months. She has lost 8 lb in four weeks. Has a history of fatty liver. She needs refill on Pantoprazole 40 mg once per day.   HPI:   [name] is a 57 y.o. female with past medical history of  arthritis, asthma, cervical cancer, fibromyalgia, GERD, high cholesterol, HTN, RA and jejunitis.    Patient presenting today for abdominal pain, nausea and vomiting.  Last seen January 2023, at that time, here for ED follow up for n/v/upper abd pain. At that time, she reported  Having epigastric pain, nausea and diarrhea, 4-5x/day since having the flu December prior. Was on antibiotics back in December when she was sick, however stool studies with PCP thereafter were negative.  Nausea is unchanged with eating. Pain worse postprandial. Had carafate from the ED which she states helped some with her epigastric pain, though she only took it once a day and felt this was sufficient.  She denies any issues with reflux. on protonix '40mg'$  daily. She states that she feels full all the time even when she hasn't eaten very much. Previously on dicylcomine but she states that it caused her to have constipation so she does not wish to take that anymore.  has lost about 7 pounds since symptoms began as she avoids eating because she knows epigastric pain and diarrhea will be worse thereafter. She does have zofran that she takes as needed for nausea 3-4x/day. Patient requesting refill of hydrocodone for pain   Recommend starting bentyl for abd pain, continue PPI daily, carafate QID, EGD if no improvement.   Notably patient was scheduled for EGD on 01/25/22 but cancelled   Labs in January 2023 when she presented to the ED with n/v/ epigastric pain with normal lipase,  CMP, CBC   Present:  Patient notes that certain foods tend to cause her to vomit. Peanuts, broccoli, cauliflower tend to cause her to have worse abd pain and more vomiting. She has intermittent epigastric pain that is sharp in nature. She is taking tylenol which does not seem to help much. Eating makes pain worse.  Denies rectal bleeding or melena. Ran out of protonix a week ago and has had some heartburn since then. She is taking dicyclomine once daily which does not help  much, cannot take twice a day or she is constipated. Notes that stools sometimes appear grainy, can be yellow or green when her stomach hurts worse. She is s/p GB. Took carafate in the past which did not help. She avoid eating due to worsening pain. She has lost about 8 pounds in the past month. She has taken some aleve but not taken it on a regular basis, she does not drink etoh or use tobacco. Using zofran often for nausea and vomiting  Recent RUQ [place] in September showed fatty liver, no other abnormalities    Last Colonoscopy:09/14/20- The examined portion of the ileum was normal. - One 6 mm polyp at the splenic flexure,tubular adenoma - One small polyp in the sigmoid colon. Tubular adenoma - Diverticulosis in the sigmoid colon. Last Endoscopy:10/16/2019 - Normal esophagus. - Z-line irregular, 36 cm from the incisors. - 2 cm hiatal hernia. - Gastritis. ( Mild reactive gastropathy) - Normal duodenal bulb, second portion  of the duodenum and third portion of the duodenum. - Congested duodenal mucosa. (Benign small bowel mucosa, no dysplasia or malignancy) - Congested (edematous) jejunum. (Benign small bowel mucosa)   Recommendations:  Repeat colonoscopy recommended for oct 2025   Past Medical History:  Diagnosis Date   Arthritis    Asthma    triggered by weather changes; no inhaler   Cancer ([place])    Cervical   Collagen vascular disease ([place])    Fibromyalgia    Ganglion cyst of wrist 12/2012   left dorsal cyst   GERD  (gastroesophageal reflux disease)    TUMS as needed   Headache(784.0)    tension/migraines   Hypercholesteremia    Hypertension    PONV (postoperative nausea and vomiting)    also states stopped breathing after hernia surgery   Rectal bleeding    Rheumatoid arthritis ([facility])    Stenosing tenosynovitis of finger 12/2012   left middle finger   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 [facility])     Past Surgical History:  Procedure Laterality Date   [place]   BIOPSY  12/01/2018   Procedure: BIOPSY;  Surgeon: Rogene Houston, MD;  [facility];  Service: Endoscopy;;  gastric    BIOPSY N/A 10/16/2019   Procedure: DUODENUM and JEJUNUM BIOPSY;  Surgeon: Rogene Houston, MD;  [facility];  Service: Endoscopy;  Laterality: N/A;   COLONOSCOPY N/A 05/08/2013   Procedure: COLONOSCOPY;  Surgeon: Rogene Houston, MD;  [facility];  Service: Endoscopy;  Laterality: N/A;  240   COLONOSCOPY N/A 09/23/2015   Procedure: COLONOSCOPY;  Surgeon: Rogene Houston, MD;  [facility];  Service: Endoscopy;  Laterality: N/A;  825   COLONOSCOPY WITH PROPOFOL N/A 09/14/2020   Procedure: COLONOSCOPY WITH PROPOFOL;  Surgeon: Rogene Houston, MD;  [facility];  Service: Endoscopy;  Laterality: N/A;  [place] AND BIOPSY  04/01/2007   ECTOPIC PREGNANCY SURGERY  1995   left tube and ovary removed   ENTEROSCOPY N/A 10/16/2019   Procedure: PUSH ENTEROSCOPY;  Surgeon: Rogene Houston, MD;  [facility];  Service: Endoscopy;  Laterality: N/A;   ESOPHAGOGASTRODUODENOSCOPY N/A 12/01/2018   Procedure: ESOPHAGOGASTRODUODENOSCOPY (EGD);  Surgeon: Rogene Houston, MD;  [facility];  Service: Endoscopy;  Laterality: N/A;  12:45   ESOPHAGOGASTRODUODENOSCOPY (EGD) WITH PROPOFOL N/A 10/16/2019   Procedure: ESOPHAGOGASTRODUODENOSCOPY (EGD) WITH PROPOFOL;  Surgeon: Rogene Houston, MD;   [facility];  Service: Endoscopy;  Laterality: N/A;  1430pm   GANGLION CYST EXCISION  12/17/2012   Procedure: REMOVAL GANGLION OF WRIST;  Surgeon: Wynonia Sours, MD;  Location: [place];  Service: Orthopedics;  Laterality: Left;   HARDWARE REMOVAL Right 08/09/2014   Procedure: Placement Right Ankle Tight Rope, Removal Plate and Screws;  Surgeon: Marybelle Killings, MD;  Location: [place];  Service: Orthopedics;  Laterality: Right;   POLYPECTOMY  09/14/2020   Procedure: POLYPECTOMY;  Surgeon: Rogene Houston, MD;  [facility];  Service: Endoscopy;;   PUBOVAGINAL SLING  2010   rt ankle surgery     SALPINGOOPHORECTOMY  1996   right   TRIGGER FINGER RELEASE  12/17/2012   Procedure: RELEASE TRIGGER FINGER/A-1 PULLEY;  Surgeon: Wynonia Sours, MD;  Location: [place];  Service: Orthopedics;  Laterality: Left;   [place]  VAGINA RECONSTRUCTION SURGERY     x 5 since 2010   VENTRAL HERNIA REPAIR  09/06/2006    Current Outpatient Medications  Medication Sig Dispense Refill   albuterol (VENTOLIN HFA) 108 (90 Base) MCG/ACT inhaler Inhale 2 puffs into the lungs every 4 (four) hours as needed for wheezing or shortness of breath.      Cholecalciferol (VITAMIN D3) 125 MCG (5000 UT) CAPS Take 5,000 Units by mouth daily.      dicyclomine (BENTYL) 10 MG capsule TAKE 1 CAPSULE (10 MG TOTAL) BY MOUTH 2 (TWO) TIMES DAILY AS NEEDED FOR SPASMS. 180 capsule 1   doxycycline (VIBRAMYCIN) 100 MG capsule Take 100 mg by mouth 2 (two) times daily.     estradiol (ESTRACE) 1 MG tablet Take 1 mg by mouth daily.     gabapentin (NEURONTIN) 400 MG capsule Take 1 capsule (400 mg total) by mouth at bedtime. 90 capsule 0   olmesartan (BENICAR) 5 MG tablet Take 5 mg by mouth daily.     ondansetron (ZOFRAN-ODT) 8 MG disintegrating tablet DISSOLVE 1 TABLET ON THE TONGUE EVERY 8 HOURS AS NEEDED FOR NAUSEA AND VOMITING 12 tablet 4   pantoprazole (PROTONIX) 40 MG tablet TAKE 1  TABLET BY MOUTH EVERY DAY 30 tablet 11   triamcinolone ointment (KENALOG) 0.1 % Apply 1 Application topically 2 (two) times daily. 30 g 0   No current facility-administered medications for this visit.    Allergies as of 01/31/2023 - Review Complete 01/31/2023  Allergen Reaction Noted   Imitrex [sumatriptan]  04/29/2013   Cymbalta [duloxetine hcl] Rash 04/29/2013   Latex Rash 12/11/2012   Propoxyphene n-acetaminophen Nausea And Vomiting and Rash    Tramadol Rash 08/05/2014    Family History  Problem Relation Age of Onset   Lung cancer Mother    Cervical cancer Mother    Heart disease Father    Leukemia Father    Cancer Maternal Grandmother        bladder and colon cancer    Social History   Socioeconomic History   Marital status: Married    Spouse name: Not on file   Number of children: 2   Years of education: Not on file   Highest education level: Not on file  Occupational History   Occupation: Press photographer  Tobacco Use   Smoking status: Never   Smokeless tobacco: Never  Vaping Use   Vaping Use: Never used  Substance and Sexual Activity   Alcohol use: No   Drug use: No   Sexual activity: Yes    Birth control/protection: Surgical    Comment: hyst tubal  Other Topics Concern   Not on file  Social History Narrative   Not on file   Social Determinants of Health   Financial Resource Strain: Low Risk  (02/17/2021)   Overall Financial Resource Strain (CARDIA)    Difficulty of Paying Living Expenses: Not hard at all  Food Insecurity: No Food Insecurity (02/17/2021)   Hunger Vital Sign    Worried About Running Out of Food in the Last Year: Never true    Ran Out of Food in the Last Year: Never true  Transportation Needs: No Transportation Needs (02/17/2021)   PRAPARE - Hydrologist (Medical): No    Lack of Transportation (Non-Medical): No  Physical Activity: Inactive (02/17/2021)   Exercise Vital Sign    Days of Exercise per Week: 0 days     Minutes of Exercise per Session: 0 min  Stress:  Stress Concern Present (02/17/2021)   [place]    Feeling of Stress : To some extent  Social Connections: Unknown (02/17/2021)   Social Connection and Isolation Panel [NHANES]    Frequency of Communication with Friends and Family: More than three times a week    Frequency of Social Gatherings with Friends and Family: Once a week    Attends Religious Services: Patient refused    Marine scientist or Organizations: Patient refused    Attends Music therapist: Patient refused    Marital Status: Married    Review of systems General: negative for malaise, night sweats, fever, chills, weight loss Neck: Negative for lumps, goiter, pain and significant neck swelling Resp: Negative for cough, wheezing, dyspnea at rest CV: Negative for chest pain, leg swelling, palpitations, orthopnea GI: denies melena, hematochezia, diarrhea, constipation, dysphagia, odyonophagia, early satiety or unintentional weight loss. +nausea/vomiting +epigastric pain  MSK: Negative for joint pain or swelling, back pain, and muscle pain. Derm: Negative for itching or rash Psych: Denies depression, anxiety, memory loss, confusion. No homicidal or suicidal ideation.  Heme: Negative for prolonged bleeding, bruising easily, and swollen nodes. Endocrine: Negative for cold or heat intolerance, polyuria, polydipsia and goiter. Neuro: negative for tremor, gait imbalance, syncope and seizures. The remainder of the review of systems is noncontributory.  Physical Exam: BP 105/72 (BP Location: Left Arm, Patient Position: Sitting, Cuff Size: Large)   Pulse 90   Temp 97.8 F (36.6 C) (Temporal)   Ht '5\' 3"'$  (1.6 m)   Wt 192 lb 9.6 oz (87.4 kg)   BMI 34.12 kg/m  General:   Alert and oriented. No distress noted. Pleasant and cooperative.  Head:  Normocephalic and atraumatic. Eyes:  Conjuctiva clear  without scleral icterus. Mouth:  Oral mucosa pink and moist. Good dentition. No lesions. Heart: Normal rate and rhythm, s1 and s2 heart sounds present.  Lungs: Clear lung sounds in all lobes. Respirations equal and unlabored. Abdomen:  +BS, soft, and non-distended. TTP of epigastric region. No rebound or guarding. No HSM or masses noted. Derm: No palmar erythema or jaundice Msk:  Symmetrical without gross deformities. Normal posture. Extremities:  Without edema. Neurologic:  Alert and  oriented x4 Psych:  Alert and cooperative. Normal mood and affect.  Invalid input(s): "6 MONTHS"   ASSESSMENT: LORRIANNE REYBURN is a 57 y.o. female presenting today for nausea/vomiting and epigastric pain   Ongoing nausea, vomiting and epigastric pain over the past couple of years. Symptoms worse with eating. Not improved with dicyclomine or carafate. Taking PPI daily without any relief of symptoms. No rectal bleeding or melena. Previously scheduled for EGD which was never completed. She is not taking frequent NSAIDs smoking or drinking alcohol. Recommend proceeding with EGD for further evaluation of her symptoms as I cannot rule out gastritis, duodenitis, PUD or malignancy. Will continue with PPI daily, can use zofran '4mg'$  odt q8h PRN for nausea/vomiting. Should continue to avoid NSAIDs. Indications, risks and benefits of procedure discussed in detail with patient. Patient verbalized understanding and is in agreement to proceed with EGD.   PLAN:  Restart protonix '40mg'$  daily  2. Schedule EGD-[facility] ENDO 1 3. Zofran '4mg'$  Q8H  4. Avoid NSAIDs   All questions were answered, patient verbalized understanding and is in agreement with plan as outlined above.    Follow Up: 3 months   Iolanda Folson L. Alver Sorrow, MSN, APRN, AGNP-C Adult-Gerontology Nurse Practitioner [facility]  I have reviewed the note and agree with the APP's assessment as described in this progress note  If negative EGD, may  consider CT angio given postprandial pain.  Maylon Peppers, MD Gastroenterology and Hepatology [facility]

## 2023-01-31 NOTE — Patient Instructions (Signed)
I have sent a refill of your protonix 72m to take once daily You can take zofran 41mup to every 8 hours as needed for nausea/vomiting Please avoid NSAIDs (advil, aleve, naproxen, goody powder, ibuprofen) as these can be very hard on your GI tract, causing inflammation, ulcers and damage to the lining of your GI tract.   We will get you scheduled for upper endoscopy for further evaluation of your symptoms  Follow up 3 months  It was a pleasure to see you today. I want to create trusting relationships with patients and provide genuine, compassionate, and quality care. I truly value your feedback! please be on the lookout for a survey regarding your visit with me today. I appreciate your input about our visit and your time in completing this!    Alexias Margerum L. CaAlver SorrowMSN, APRN, AGNP-C Adult-Gerontology Nurse Practitioner [facility]

## 2023-01-31 NOTE — H&P (View-Only) (Signed)
 Referring Provider: Fagan, Roy, MD Primary Care Physician:  Fagan, Roy, MD Primary GI Physician:   Chief Complaint  Patient presents with   Abdominal Pain    Patient here today due to issues with epigastric pain. She has nausea and vomiting on going for months. She has lost 8 lb in four weeks. Has a history of fatty liver. She needs refill on Pantoprazole 40 mg once per day.   HPI:   [name] is a 56 y.o. female with past medical history of  arthritis, asthma, cervical cancer, fibromyalgia, GERD, high cholesterol, HTN, RA and jejunitis.    Patient presenting today for abdominal pain, nausea and vomiting.  Last seen January 2023, at that time, here for ED follow up for n/v/upper abd pain. At that time, she reported  Having epigastric pain, nausea and diarrhea, 4-5x/day since having the flu December prior. Was on antibiotics back in December when she was sick, however stool studies with PCP thereafter were negative.  Nausea is unchanged with eating. Pain worse postprandial. Had carafate from the ED which she states helped some with her epigastric pain, though she only took it once a day and felt this was sufficient.  She denies any issues with reflux. on protonix 40mg daily. She states that she feels full all the time even when she hasn't eaten very much. Previously on dicylcomine but she states that it caused her to have constipation so she does not wish to take that anymore.  has lost about 7 pounds since symptoms began as she avoids eating because she knows epigastric pain and diarrhea will be worse thereafter. She does have zofran that she takes as needed for nausea 3-4x/day. Patient requesting refill of hydrocodone for pain   Recommend starting bentyl for abd pain, continue PPI daily, carafate QID, EGD if no improvement.   Notably patient was scheduled for EGD on 01/25/22 but cancelled   Labs in January 2023 when she presented to the ED with n/v/ epigastric pain with normal lipase,  CMP, CBC   Present:  Patient notes that certain foods tend to cause her to vomit. Peanuts, broccoli, cauliflower tend to cause her to have worse abd pain and more vomiting. She has intermittent epigastric pain that is sharp in nature. She is taking tylenol which does not seem to help much. Eating makes pain worse.  Denies rectal bleeding or melena. Ran out of protonix a week ago and has had some heartburn since then. She is taking dicyclomine once daily which does not help  much, cannot take twice a day or she is constipated. Notes that stools sometimes appear grainy, can be yellow or green when her stomach hurts worse. She is s/p GB. Took carafate in the past which did not help. She avoid eating due to worsening pain. She has lost about 8 pounds in the past month. She has taken some aleve but not taken it on a regular basis, she does not drink etoh or use tobacco. Using zofran often for nausea and vomiting  Recent RUQ US in September showed fatty liver, no other abnormalities    Last Colonoscopy:09/14/20- The examined portion of the ileum was normal. - One 6 mm polyp at the splenic flexure,tubular adenoma - One small polyp in the sigmoid colon. Tubular adenoma - Diverticulosis in the sigmoid colon. Last Endoscopy:10/16/2019 - Normal esophagus. - Z-line irregular, 36 cm from the incisors. - 2 cm hiatal hernia. - Gastritis. ( Mild reactive gastropathy) - Normal duodenal bulb, second portion   of the duodenum and third portion of the duodenum. - Congested duodenal mucosa. (Benign small bowel mucosa, no dysplasia or malignancy) - Congested (edematous) jejunum. (Benign small bowel mucosa)   Recommendations:  Repeat colonoscopy recommended for oct 2025   Past Medical History:  Diagnosis Date   Arthritis    Asthma    triggered by weather changes; no inhaler   Cancer (HCC)    Cervical   Collagen vascular disease (HCC)    Fibromyalgia    Ganglion cyst of wrist 12/2012   left dorsal cyst   GERD  (gastroesophageal reflux disease)    TUMS as needed   Headache(784.0)    tension/migraines   Hypercholesteremia    Hypertension    PONV (postoperative nausea and vomiting)    also states stopped breathing after hernia surgery   Rectal bleeding    Rheumatoid arthritis (HCC)    Stenosing tenosynovitis of finger 12/2012   left middle finger   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 (HCC)     Past Surgical History:  Procedure Laterality Date   ABDOMINAL HYSTERECTOMY  1994   APPENDECTOMY  1996   BIOPSY  12/01/2018   Procedure: BIOPSY;  Surgeon: Rehman, Najeeb U, MD;  [facility];  Service: Endoscopy;;  gastric    BIOPSY N/A 10/16/2019   Procedure: DUODENUM and JEJUNUM BIOPSY;  Surgeon: Rehman, Najeeb U, MD;  [facility];  Service: Endoscopy;  Laterality: N/A;   COLONOSCOPY N/A 05/08/2013   Procedure: COLONOSCOPY;  Surgeon: Najeeb U Rehman, MD;  [facility];  Service: Endoscopy;  Laterality: N/A;  240   COLONOSCOPY N/A 09/23/2015   Procedure: COLONOSCOPY;  Surgeon: Najeeb U Rehman, MD;  [facility];  Service: Endoscopy;  Laterality: N/A;  825   COLONOSCOPY WITH PROPOFOL N/A 09/14/2020   Procedure: COLONOSCOPY WITH PROPOFOL;  Surgeon: Rehman, Najeeb U, MD;  [facility];  Service: Endoscopy;  Laterality: N/A;  730   CYSTOSCOPY WITH HYDRODISTENSION AND BIOPSY  04/01/2007   ECTOPIC PREGNANCY SURGERY  1995   left tube and ovary removed   ENTEROSCOPY N/A 10/16/2019   Procedure: PUSH ENTEROSCOPY;  Surgeon: Rehman, Najeeb U, MD;  [facility];  Service: Endoscopy;  Laterality: N/A;   ESOPHAGOGASTRODUODENOSCOPY N/A 12/01/2018   Procedure: ESOPHAGOGASTRODUODENOSCOPY (EGD);  Surgeon: Rehman, Najeeb U, MD;  [facility];  Service: Endoscopy;  Laterality: N/A;  12:45   ESOPHAGOGASTRODUODENOSCOPY (EGD) WITH PROPOFOL N/A 10/16/2019   Procedure: ESOPHAGOGASTRODUODENOSCOPY (EGD) WITH PROPOFOL;  Surgeon: Rehman, Najeeb U, MD;   [facility];  Service: Endoscopy;  Laterality: N/A;  1430pm   GANGLION CYST EXCISION  12/17/2012   Procedure: REMOVAL GANGLION OF WRIST;  Surgeon: Gary R Kuzma, MD;  Location: [facility];  Service: Orthopedics;  Laterality: Left;   HARDWARE REMOVAL Right 08/09/2014   Procedure: Placement Right Ankle Tight Rope, Removal Plate and Screws;  Surgeon: Mark C Yates, MD;  Location: [facility];  Service: Orthopedics;  Laterality: Right;   POLYPECTOMY  09/14/2020   Procedure: POLYPECTOMY;  Surgeon: Rehman, Najeeb U, MD;  [facility];  Service: Endoscopy;;   PUBOVAGINAL SLING  2010   rt ankle surgery     SALPINGOOPHORECTOMY  1996   right   TRIGGER FINGER RELEASE  12/17/2012   Procedure: RELEASE TRIGGER FINGER/A-1 PULLEY;  Surgeon: Gary R Kuzma, MD;  Location: [facility];  Service: Orthopedics;  Laterality: Left;   TUBAL LIGATION  1992     VAGINA RECONSTRUCTION SURGERY     x 5 since 2010   VENTRAL HERNIA REPAIR  09/06/2006    Current Outpatient Medications  Medication Sig Dispense Refill   albuterol (VENTOLIN HFA) 108 (90 Base) MCG/ACT inhaler Inhale 2 puffs into the lungs every 4 (four) hours as needed for wheezing or shortness of breath.      Cholecalciferol (VITAMIN D3) 125 MCG (5000 UT) CAPS Take 5,000 Units by mouth daily.      dicyclomine (BENTYL) 10 MG capsule TAKE 1 CAPSULE (10 MG TOTAL) BY MOUTH 2 (TWO) TIMES DAILY AS NEEDED FOR SPASMS. 180 capsule 1   doxycycline (VIBRAMYCIN) 100 MG capsule Take 100 mg by mouth 2 (two) times daily.     estradiol (ESTRACE) 1 MG tablet Take 1 mg by mouth daily.     gabapentin (NEURONTIN) 400 MG capsule Take 1 capsule (400 mg total) by mouth at bedtime. 90 capsule 0   olmesartan (BENICAR) 5 MG tablet Take 5 mg by mouth daily.     ondansetron (ZOFRAN-ODT) 8 MG disintegrating tablet DISSOLVE 1 TABLET ON THE TONGUE EVERY 8 HOURS AS NEEDED FOR NAUSEA AND VOMITING 12 tablet 4   pantoprazole (PROTONIX) 40 MG tablet TAKE 1  TABLET BY MOUTH EVERY DAY 30 tablet 11   triamcinolone ointment (KENALOG) 0.1 % Apply 1 Application topically 2 (two) times daily. 30 g 0   No current facility-administered medications for this visit.    Allergies as of 01/31/2023 - Review Complete 01/31/2023  Allergen Reaction Noted   Imitrex [sumatriptan]  04/29/2013   Cymbalta [duloxetine hcl] Rash 04/29/2013   Latex Rash 12/11/2012   Propoxyphene n-acetaminophen Nausea And Vomiting and Rash    Tramadol Rash 08/05/2014    Family History  Problem Relation Age of Onset   Lung cancer Mother    Cervical cancer Mother    Heart disease Father    Leukemia Father    Cancer Maternal Grandmother        bladder and colon cancer    Social History   Socioeconomic History   Marital status: Married    Spouse name: Not on file   Number of children: 2   Years of education: Not on file   Highest education level: Not on file  Occupational History   Occupation: sales  Tobacco Use   Smoking status: Never   Smokeless tobacco: Never  Vaping Use   Vaping Use: Never used  Substance and Sexual Activity   Alcohol use: No   Drug use: No   Sexual activity: Yes    Birth control/protection: Surgical    Comment: hyst tubal  Other Topics Concern   Not on file  Social History Narrative   Not on file   Social Determinants of Health   Financial Resource Strain: Low Risk  (02/17/2021)   Overall Financial Resource Strain (CARDIA)    Difficulty of Paying Living Expenses: Not hard at all  Food Insecurity: No Food Insecurity (02/17/2021)   Hunger Vital Sign    Worried About Running Out of Food in the Last Year: Never true    Ran Out of Food in the Last Year: Never true  Transportation Needs: No Transportation Needs (02/17/2021)   PRAPARE - Transportation    Lack of Transportation (Medical): No    Lack of Transportation (Non-Medical): No  Physical Activity: Inactive (02/17/2021)   Exercise Vital Sign    Days of Exercise per Week: 0 days     Minutes of Exercise per Session: 0 min  Stress:   Stress Concern Present (02/17/2021)   [employer] - Occupational Stress Questionnaire    Feeling of Stress : To some extent  Social Connections: Unknown (02/17/2021)   Social Connection and Isolation Panel [NHANES]    Frequency of Communication with Friends and Family: More than three times a week    Frequency of Social Gatherings with Friends and Family: Once a week    Attends Religious Services: Patient refused    Active Member of Clubs or Organizations: Patient refused    Attends Club or Organization Meetings: Patient refused    Marital Status: Married    Review of systems General: negative for malaise, night sweats, fever, chills, weight loss Neck: Negative for lumps, goiter, pain and significant neck swelling Resp: Negative for cough, wheezing, dyspnea at rest CV: Negative for chest pain, leg swelling, palpitations, orthopnea GI: denies melena, hematochezia, diarrhea, constipation, dysphagia, odyonophagia, early satiety or unintentional weight loss. +nausea/vomiting +epigastric pain  MSK: Negative for joint pain or swelling, back pain, and muscle pain. Derm: Negative for itching or rash Psych: Denies depression, anxiety, memory loss, confusion. No homicidal or suicidal ideation.  Heme: Negative for prolonged bleeding, bruising easily, and swollen nodes. Endocrine: Negative for cold or heat intolerance, polyuria, polydipsia and goiter. Neuro: negative for tremor, gait imbalance, syncope and seizures. The remainder of the review of systems is noncontributory.  Physical Exam: BP 105/72 (BP Location: Left Arm, Patient Position: Sitting, Cuff Size: Large)   Pulse 90   Temp 97.8 F (36.6 C) (Temporal)   Ht 5' 3" (1.6 m)   Wt 192 lb 9.6 oz (87.4 kg)   BMI 34.12 kg/m  General:   Alert and oriented. No distress noted. Pleasant and cooperative.  Head:  Normocephalic and atraumatic. Eyes:  Conjuctiva clear  without scleral icterus. Mouth:  Oral mucosa pink and moist. Good dentition. No lesions. Heart: Normal rate and rhythm, s1 and s2 heart sounds present.  Lungs: Clear lung sounds in all lobes. Respirations equal and unlabored. Abdomen:  +BS, soft, and non-distended. TTP of epigastric region. No rebound or guarding. No HSM or masses noted. Derm: No palmar erythema or jaundice Msk:  Symmetrical without gross deformities. Normal posture. Extremities:  Without edema. Neurologic:  Alert and  oriented x4 Psych:  Alert and cooperative. Normal mood and affect.  Invalid input(s): "6 MONTHS"   ASSESSMENT: Paisly K Mcpartland is a 56 y.o. female presenting today for nausea/vomiting and epigastric pain   Ongoing nausea, vomiting and epigastric pain over the past couple of years. Symptoms worse with eating. Not improved with dicyclomine or carafate. Taking PPI daily without any relief of symptoms. No rectal bleeding or melena. Previously scheduled for EGD which was never completed. She is not taking frequent NSAIDs smoking or drinking alcohol. Recommend proceeding with EGD for further evaluation of her symptoms as I cannot rule out gastritis, duodenitis, PUD or malignancy. Will continue with PPI daily, can use zofran 4mg odt q8h PRN for nausea/vomiting. Should continue to avoid NSAIDs. Indications, risks and benefits of procedure discussed in detail with patient. Patient verbalized understanding and is in agreement to proceed with EGD.   PLAN:  Restart protonix 40mg daily  2. Schedule EGD-ASA II ENDO 1 3. Zofran 4mg Q8H  4. Avoid NSAIDs   All questions were answered, patient verbalized understanding and is in agreement with plan as outlined above.    Follow Up: 3 months   Nakhi Choi L. Jule Whitsel, MSN, APRN, AGNP-C Adult-Gerontology Nurse Practitioner [facility]    I have reviewed the note and agree with the APP's assessment as described in this progress note  If negative EGD, may  consider CT angio given postprandial pain.  Daniel Castaneda, MD Gastroenterology and Hepatology [facility]  

## 2023-02-03 NOTE — Addendum Note (Signed)
Addended by: Harvel Quale on: 02/03/2023 11:15 AM   Modules accepted: Level of Service

## 2023-02-07 ENCOUNTER — Encounter: Payer: Self-pay | Admitting: Radiology

## 2023-02-22 ENCOUNTER — Encounter (HOSPITAL_COMMUNITY): Payer: Self-pay

## 2023-02-22 ENCOUNTER — Ambulatory Visit (HOSPITAL_COMMUNITY): Payer: 59 | Admitting: Certified Registered"

## 2023-02-22 ENCOUNTER — Encounter (HOSPITAL_COMMUNITY): Payer: Self-pay | Admitting: Gastroenterology

## 2023-02-22 ENCOUNTER — Ambulatory Visit (HOSPITAL_COMMUNITY)
Admission: RE | Admit: 2023-02-22 | Discharge: 2023-02-22 | Disposition: A | Discharge status: Home / Self Care | Payer: 59 | Source: Ambulatory Visit | Attending: Gastroenterology | Admitting: Gastroenterology

## 2023-02-22 ENCOUNTER — Other Ambulatory Visit: Payer: Self-pay

## 2023-02-22 ENCOUNTER — Ambulatory Visit (HOSPITAL_BASED_OUTPATIENT_CLINIC_OR_DEPARTMENT_OTHER): Payer: 59 | Admitting: Certified Registered"

## 2023-02-22 ENCOUNTER — Encounter (HOSPITAL_COMMUNITY)
Admission: RE | Disposition: A | Discharge status: Home / Self Care | Payer: Self-pay | Source: Ambulatory Visit | Attending: Gastroenterology

## 2023-02-22 ENCOUNTER — Ambulatory Visit (HOSPITAL_COMMUNITY)
Admission: RE | Admit: 2023-02-22 | Discharge: 2023-02-22 | Disposition: A | Discharge status: Home / Self Care | Payer: 59 | Source: Ambulatory Visit | Attending: Obstetrics & Gynecology | Admitting: Obstetrics & Gynecology

## 2023-02-22 DIAGNOSIS — Z1231 Encounter for screening mammogram for malignant neoplasm of breast: Secondary | ICD-10-CM | POA: Diagnosis present

## 2023-02-22 DIAGNOSIS — K2950 Unspecified chronic gastritis without bleeding: Secondary | ICD-10-CM | POA: Insufficient documentation

## 2023-02-22 DIAGNOSIS — K3189 Other diseases of stomach and duodenum: Secondary | ICD-10-CM | POA: Diagnosis not present

## 2023-02-22 DIAGNOSIS — M797 Fibromyalgia: Secondary | ICD-10-CM | POA: Insufficient documentation

## 2023-02-22 DIAGNOSIS — R1084 Generalized abdominal pain: Secondary | ICD-10-CM

## 2023-02-22 DIAGNOSIS — R112 Nausea with vomiting, unspecified: Secondary | ICD-10-CM | POA: Insufficient documentation

## 2023-02-22 DIAGNOSIS — Z8541 Personal history of malignant neoplasm of cervix uteri: Secondary | ICD-10-CM | POA: Insufficient documentation

## 2023-02-22 DIAGNOSIS — K219 Gastro-esophageal reflux disease without esophagitis: Secondary | ICD-10-CM | POA: Diagnosis not present

## 2023-02-22 DIAGNOSIS — J45909 Unspecified asthma, uncomplicated: Secondary | ICD-10-CM | POA: Diagnosis not present

## 2023-02-22 DIAGNOSIS — K760 Fatty (change of) liver, not elsewhere classified: Secondary | ICD-10-CM | POA: Diagnosis not present

## 2023-02-22 DIAGNOSIS — Z79899 Other long term (current) drug therapy: Secondary | ICD-10-CM | POA: Insufficient documentation

## 2023-02-22 DIAGNOSIS — R1013 Epigastric pain: Secondary | ICD-10-CM | POA: Diagnosis present

## 2023-02-22 DIAGNOSIS — I10 Essential (primary) hypertension: Secondary | ICD-10-CM | POA: Insufficient documentation

## 2023-02-22 HISTORY — PX: ESOPHAGOGASTRODUODENOSCOPY (EGD) WITH PROPOFOL: SHX5813

## 2023-02-22 SURGERY — ESOPHAGOGASTRODUODENOSCOPY (EGD) WITH PROPOFOL
Anesthesia: General

## 2023-02-22 MED ORDER — LACTATED RINGERS IV SOLN: INTRAVENOUS | Status: DC

## 2023-02-22 MED ORDER — LIDOCAINE HCL (CARDIAC) PF 100 MG/5ML IV SOSY
PREFILLED_SYRINGE | INTRAVENOUS | Status: DC | PRN
  Administered 2023-02-22: 50 mg via INTRAVENOUS

## 2023-02-22 MED ORDER — PROPOFOL 10 MG/ML IV BOLUS
INTRAVENOUS | Status: DC | PRN
  Administered 2023-02-22: 140 mg via INTRAVENOUS

## 2023-02-22 MED ORDER — PROPOFOL 500 MG/50ML IV EMUL
INTRAVENOUS | Status: DC | PRN
  Administered 2023-02-22: 150 ug/kg/min via INTRAVENOUS

## 2023-02-22 NOTE — Interval H&P Note (Signed)
History and Physical Interval Note:  02/22/2023 12:18 PM  [name]  has presented today for surgery, with the diagnosis of epigastric pain, nausea, vomiting.  The various methods of treatment have been discussed with the patient and family. After consideration of risks, benefits and other options for treatment, the patient has consented to  Procedure(s) with comments: ESOPHAGOGASTRODUODENOSCOPY (EGD) WITH PROPOFOL (N/A) - 230pm, asa 2 as a surgical intervention.  The patient's history has been reviewed, patient examined, no change in status, stable for surgery.  I have reviewed the patient's chart and labs.  Questions were answered to the patient's satisfaction.     [name]

## 2023-02-22 NOTE — Op Note (Signed)
[facility] Patient Name: [name] Procedure Date: 02/22/2023 2:35 PM MRN: [id number] Date of Birth: November 16, 1966 Attending MD: Maylon Peppers , , [id number] CSN: [id number] Age: 57 Admit Type: Outpatient Procedure:                Upper GI endoscopy Indications:              Epigastric abdominal pain Providers:                Maylon Peppers, Lurline Del, RN, Kristine L. Risa Grill, Technician Referring MD:              Medicines:                Monitored Anesthesia Care Complications:            No immediate complications. Estimated Blood Loss:     Estimated blood loss: none. Procedure:                Pre-Anesthesia Assessment:                           - Prior to the procedure, a History and Physical                            was performed, and patient medications, allergies                            and sensitivities were reviewed. The patient's                            tolerance of previous anesthesia was reviewed.                           - The risks and benefits of the procedure and the                            sedation options and risks were discussed with the                            patient. All questions were answered and informed                            consent was obtained.                           - ASA Grade Assessment: II - A patient with mild                            systemic disease.                           After obtaining informed consent, the endoscope was                            passed under direct vision. Throughout the  procedure, the patient's blood pressure, pulse, and                            oxygen saturations were monitored continuously. The                            [id number]) scope was introduced through the                            mouth, and advanced to the second part of duodenum.                            The upper GI endoscopy was accomplished without                             difficulty. The patient tolerated the procedure                            well. Scope In: 2:45:05 PM Scope Out: 2:50:19 PM Total Procedure Duration: 0 hours 5 minutes 14 seconds  Findings:      The gastroesophageal flap valve was visualized endoscopically and       classified as Hill Grade II (fold present, opens with respiration).      The esophagus was normal.      The entire examined stomach was normal. Biopsies were taken with a cold       forceps for Helicobacter pylori testing.      The examined duodenum was normal. Biopsies were taken with a cold       forceps for histology. Impression:               - Normal esophagus.                           - Normal stomach. Biopsied.                           - Normal examined duodenum. Biopsied. Moderate Sedation:      Per Anesthesia Care Recommendation:           - Discharge patient to home (ambulatory).                           - Resume previous diet.                           - Await pathology results. Procedure Code(s):        --- Professional ---                           [phone], Esophagogastroduodenoscopy, flexible,                            transoral; with biopsy, single or multiple Diagnosis Code(s):        --- Professional ---                           R10.13, Epigastric pain CPT copyright 2022  American Medical Association. All rights reserved. The codes documented in this report are preliminary and upon coder review may  be revised to meet current compliance requirements. Maylon Peppers, MD Maylon Peppers,  02/22/2023 2:55:52 PM This report has been signed electronically. Number of Addenda: 0

## 2023-02-22 NOTE — Discharge Instructions (Signed)
You are being discharged to home.  Resume your previous diet.  We are waiting for your pathology results.  

## 2023-02-22 NOTE — Transfer of Care (Signed)
Immediate Anesthesia Transfer of Care Note  Patient: [name]  Procedure(s) Performed: ESOPHAGOGASTRODUODENOSCOPY (EGD) WITH PROPOFOL  Patient Location: Endoscopy Unit  Anesthesia Type:General  Level of Consciousness: drowsy  Airway & Oxygen Therapy: Patient Spontanous Breathing  Post-op Assessment: Report given to RN and Post -op Vital signs reviewed and stable  Post vital signs: Reviewed and stable  Last Vitals:  Vitals Value Taken Time  BP    Temp    Pulse    Resp    SpO2      Last Pain:  Vitals:   02/22/23 1306  TempSrc: Oral  PainSc: 0-No pain      Patients Stated Pain Goal: 9 ([id number])  Complications: No notable events documented.

## 2023-02-22 NOTE — Anesthesia Procedure Notes (Signed)
Date/Time: 02/22/2023 2:46 PM  Performed by: Orlie Dakin, CRNAPre-anesthesia Checklist: Patient identified, Emergency Drugs available, Suction available and Patient being monitored Patient Re-evaluated:Patient Re-evaluated prior to induction Oxygen Delivery Method: Nasal cannula Placement Confirmation: positive ETCO2

## 2023-02-22 NOTE — Anesthesia Preprocedure Evaluation (Signed)
Anesthesia Evaluation  Patient identified by MRN, date of birth, ID band Patient awake    Reviewed: Allergy & Precautions, H&P , NPO status , Patient's Chart, lab work & pertinent test results, reviewed documented beta blocker date and time   History of Anesthesia Complications (+) PONV and history of anesthetic complications  Airway Mallampati: II  TM Distance: >3 FB Neck ROM: full    Dental no notable dental hx.    Pulmonary neg pulmonary ROS, asthma    Pulmonary exam normal breath sounds clear to auscultation       Cardiovascular Exercise Tolerance: Good hypertension, negative cardio ROS  Rhythm:regular Rate:Normal     Neuro/Psych  Headaches PSYCHIATRIC DISORDERS Anxiety Depression     Neuromuscular disease negative neurological ROS  negative psych ROS   GI/Hepatic negative GI ROS, Neg liver ROS,GERD  ,,  Endo/Other  negative endocrine ROS    Renal/GU negative Renal ROS  negative genitourinary   Musculoskeletal   Abdominal   Peds  Hematology negative hematology ROS (+) Blood dyscrasia, anemia   Anesthesia Other Findings   Reproductive/Obstetrics negative OB ROS                             Anesthesia Physical Anesthesia Plan  ASA: 2  Anesthesia Plan: General   Post-op Pain Management:    Induction:   PONV Risk Score and Plan: Propofol infusion  Airway Management Planned:   Additional Equipment:   Intra-op Plan:   Post-operative Plan:   Informed Consent: I have reviewed the patients History and Physical, chart, labs and discussed the procedure including the risks, benefits and alternatives for the proposed anesthesia with the patient or authorized representative who has indicated his/her understanding and acceptance.     Dental Advisory Given  Plan Discussed with: CRNA  Anesthesia Plan Comments:        Anesthesia Quick Evaluation

## 2023-02-23 NOTE — Anesthesia Postprocedure Evaluation (Signed)
Anesthesia Post Note  Patient: [name]  Procedure(s) Performed: ESOPHAGOGASTRODUODENOSCOPY (EGD) WITH PROPOFOL  Patient location during evaluation: Phase II Anesthesia Type: General Level of consciousness: awake Pain management: pain level controlled Vital Signs Assessment: post-procedure vital signs reviewed and stable Respiratory status: spontaneous breathing and respiratory function stable Cardiovascular status: blood pressure returned to baseline and stable Postop Assessment: no headache and no apparent nausea or vomiting Anesthetic complications: no Comments: Late entry   No notable events documented.   Last Vitals:  Vitals:   02/22/23 1306 02/22/23 1453  BP: 137/72 (!) 101/59  Pulse: 72 77  Resp: 19 18  Temp: 37.1 C 36.6 C  SpO2: 98% 97%    Last Pain:  Vitals:   02/22/23 1453  TempSrc: Axillary  PainSc: [place]

## 2023-02-25 ENCOUNTER — Other Ambulatory Visit (HOSPITAL_COMMUNITY): Payer: Self-pay | Admitting: Obstetrics & Gynecology

## 2023-02-25 DIAGNOSIS — R928 Other abnormal and inconclusive findings on diagnostic imaging of breast: Secondary | ICD-10-CM

## 2023-02-26 ENCOUNTER — Other Ambulatory Visit (INDEPENDENT_AMBULATORY_CARE_PROVIDER_SITE_OTHER): Payer: Self-pay | Admitting: Gastroenterology

## 2023-02-27 ENCOUNTER — Telehealth (INDEPENDENT_AMBULATORY_CARE_PROVIDER_SITE_OTHER): Payer: Self-pay | Admitting: Gastroenterology

## 2023-02-27 ENCOUNTER — Ambulatory Visit (HOSPITAL_COMMUNITY): Payer: 59

## 2023-02-27 ENCOUNTER — Encounter (INDEPENDENT_AMBULATORY_CARE_PROVIDER_SITE_OTHER): Payer: Self-pay | Admitting: Gastroenterology

## 2023-02-27 ENCOUNTER — Other Ambulatory Visit (INDEPENDENT_AMBULATORY_CARE_PROVIDER_SITE_OTHER): Payer: Self-pay | Admitting: Gastroenterology

## 2023-02-27 DIAGNOSIS — R1084 Generalized abdominal pain: Secondary | ICD-10-CM

## 2023-02-27 LAB — SURGICAL PATHOLOGY

## 2023-02-27 NOTE — Telephone Encounter (Signed)
Contacted pt this morning to give CT appt. Pt states she was diagnosed with a fatty liver last October by Dr.Fagan and is wondering if that could be causing some of her issues. Please advise. Thank you  (Ok to send my chart message)

## 2023-02-27 NOTE — Telephone Encounter (Signed)
Thanks, I sent Mychart message

## 2023-03-01 NOTE — Telephone Encounter (Signed)
The egd on 10/16/19 showed a 2 cm hiatal hernia and patient had another egd on 02/21/23. She is asking if the hernia is still present

## 2023-03-05 ENCOUNTER — Encounter (HOSPITAL_COMMUNITY): Payer: Self-pay | Admitting: Gastroenterology

## 2023-03-12 ENCOUNTER — Ambulatory Visit (HOSPITAL_COMMUNITY)
Admission: RE | Admit: 2023-03-12 | Discharge: 2023-03-12 | Disposition: A | Discharge status: Home / Self Care | Payer: 59 | Source: Ambulatory Visit | Attending: Obstetrics & Gynecology | Admitting: Obstetrics & Gynecology

## 2023-03-12 ENCOUNTER — Encounter (HOSPITAL_COMMUNITY): Payer: Self-pay

## 2023-03-12 DIAGNOSIS — R928 Other abnormal and inconclusive findings on diagnostic imaging of breast: Secondary | ICD-10-CM

## 2023-03-31 ENCOUNTER — Other Ambulatory Visit (INDEPENDENT_AMBULATORY_CARE_PROVIDER_SITE_OTHER): Payer: Self-pay | Admitting: Gastroenterology

## 2023-04-24 ENCOUNTER — Ambulatory Visit (HOSPITAL_COMMUNITY)
Admission: RE | Admit: 2023-04-24 | Discharge: 2023-04-24 | Disposition: A | Discharge status: Home / Self Care | Payer: 59 | Source: Ambulatory Visit | Attending: Gastroenterology | Admitting: Gastroenterology

## 2023-04-24 DIAGNOSIS — R1084 Generalized abdominal pain: Secondary | ICD-10-CM | POA: Diagnosis present

## 2023-04-24 MED ORDER — IOHEXOL 350 MG/ML SOLN
100.0000 mL | Freq: Once | INTRAVENOUS | Status: AC | PRN
  Administered 2023-04-24: 100 mL via INTRAVENOUS

## 2023-05-30 ENCOUNTER — Encounter (INDEPENDENT_AMBULATORY_CARE_PROVIDER_SITE_OTHER): Payer: Self-pay | Admitting: Gastroenterology

## 2023-05-30 ENCOUNTER — Ambulatory Visit (INDEPENDENT_AMBULATORY_CARE_PROVIDER_SITE_OTHER): Payer: 59 | Admitting: Gastroenterology

## 2023-05-30 VITALS — BP 106/71 | HR 81 | Temp 98.0°F | Ht 63.0 in | Wt 191.6 lb

## 2023-05-30 DIAGNOSIS — R112 Nausea with vomiting, unspecified: Secondary | ICD-10-CM

## 2023-05-30 DIAGNOSIS — R1013 Epigastric pain: Secondary | ICD-10-CM | POA: Diagnosis not present

## 2023-05-30 DIAGNOSIS — K219 Gastro-esophageal reflux disease without esophagitis: Secondary | ICD-10-CM | POA: Diagnosis not present

## 2023-05-30 DIAGNOSIS — K5900 Constipation, unspecified: Secondary | ICD-10-CM

## 2023-05-30 MED ORDER — ONDANSETRON 8 MG PO TBDP: ORAL_TABLET | ORAL | 2 refills | Status: DC

## 2023-05-30 NOTE — Patient Instructions (Signed)
Continue with zofran 8mg  up to every 8 hours as needed  Continue protonix 40mg  daily  I suspect some of your nausea and abdominal pain may be from constipation, I am providing Linzess 35mcg-samples  Linzess works best when taken once a day every day, on an empty stomach, at least 30 minutes before your first meal of the day. Diarrhea/belly cramping may occur in the first 2 weeks -keep taking it.  These symptoms should taper off, as long as you are having no more than 3-4 BMs per day, looser stools are not a concern.  Please let me know how linzess is working for you as you are nearing the end of your samples.  Increase water intake, aim for atleast 64 oz per day Increase fruits, veggies and whole grains, kiwi and prunes are especially good for constipation  Follow up 6 months  It was a pleasure to see you today. I want to create trusting relationships with patients and provide genuine, compassionate, and quality care. I truly value your feedback! please be on the lookout for a survey regarding your visit with me today. I appreciate your input about our visit and your time in completing this!    Lindsey Demonte L. Jeanmarie Hubert, MSN, APRN, AGNP-C Adult-Gerontology Nurse Practitioner [facility] at [facility]

## 2023-05-30 NOTE — Progress Notes (Addendum)
Referring Provider: Carylon Perches, MD Primary Care Physician:  Jaclyn Perches, MD Primary GI Physician: [name]   Chief Complaint  Patient presents with   Nausea    Follow up on nausea. Mostly happens after eating that comes and goes. Takes zofran 8 mg and it does help.    HPI:   [name] is a 57 y.o. female with past medical history of arthritis, asthma, cervical cancer, fibromyalgia, GERD, high cholesterol, HTN, RA and jejunitis.   Patient presenting today for follow up of nausea/vomiting/gerd/epigastric pain  Last seen February 2024, at that time, having nausea, vomiting, epigastric pain, heartburn. Taking dicyclomine.Carafate previously did not help.  Recommended restart protonix, schedule EGD, zofran for nausea.  EGD as below, scheduled for CT Angio A/P in may which showed no acute abnormality, hepatic steatosis, recommended GES but this has not been completed  Present:  She notes some continued epigastric pain and nausea. No recent vomiting. Has had about 3-4 episodes this month. Sometimes symptoms can occur even without eating. Taking zofran for nausea. Denies GERD symptoms. She has lost 3 pounds but is trying to lose weight. She did note some hematuria yesterday but none today, has history of IC. She feels that symptoms are mostly well controlled for now. She takes tylenol for the pain, though has not had any pain in about 2 weeks. She does not wish to pursue GES at this time.   She does tell me that she has issues where she may not have a BM but a couple of times per month, she states she has gone up to 3 weeks without a BM. She has harder stools and will sometimes have diarrhea if she has gone a long period of time without having one. she does not take anything for her constipation. She drinks a good amount of water, 4 bottles per day. . Denies rectal bleeding or melena.   Last Colonoscopy:09/14/20- The examined portion of the ileum was normal. - One 6 mm polyp at the splenic  flexure,tubular adenoma - One small polyp in the sigmoid colon. Tubular adenoma - Diverticulosis in the sigmoid colon. Last Endoscopy:02/2023- Normal esophagus.                           - Normal stomach. Biopsied-normal                            - Normal examined duodenum. Biopsied-normal  Recommendations:  Repeat colonoscopy recommended for oct 2025    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 [facility])     Past Surgical History:  Procedure Laterality Date   ABDOMINAL HYSTERECTOMY  1994   APPENDECTOMY  1996   BIOPSY  12/01/2018  Procedure: BIOPSY;  Surgeon: Malissa Hippo, MD;  [facility];  Service: Endoscopy;;  gastric    BIOPSY N/A 10/16/2019   Procedure: DUODENUM and JEJUNUM BIOPSY;  Surgeon: Malissa Hippo, MD;  [facility]: [facility];  Service: Endoscopy;  Laterality: N/A;   COLONOSCOPY N/A 05/08/2013   Procedure: COLONOSCOPY;  Surgeon: Malissa Hippo, MD;  [facility]: [facility];  Service: Endoscopy;  Laterality: N/A;  240   COLONOSCOPY N/A 09/23/2015   Procedure: COLONOSCOPY;  Surgeon: Malissa Hippo, MD;  [facility]: [facility];  Service: Endoscopy;  Laterality: N/A;  825   COLONOSCOPY WITH PROPOFOL N/A 09/14/2020   Procedure: COLONOSCOPY WITH PROPOFOL;  Surgeon: Malissa Hippo, MD;  [facility];  Service: Endoscopy;  Laterality: N/A;  730   CYSTOSCOPY WITH  HYDRODISTENSION AND BIOPSY  04/01/2007   ECTOPIC PREGNANCY SURGERY  1995   left tube and ovary removed   ENTEROSCOPY N/A 10/16/2019   Procedure: PUSH ENTEROSCOPY;  Surgeon: Malissa Hippo, MD;  [facility];  Service: Endoscopy;  Laterality: N/A;   ESOPHAGOGASTRODUODENOSCOPY N/A 12/01/2018   Procedure: ESOPHAGOGASTRODUODENOSCOPY (EGD);  Surgeon: Malissa Hippo, MD;  [facility];  Service: Endoscopy;  Laterality: N/A;  12:45   ESOPHAGOGASTRODUODENOSCOPY (EGD) WITH PROPOFOL N/A 10/16/2019   Procedure: ESOPHAGOGASTRODUODENOSCOPY (EGD) WITH PROPOFOL;  Surgeon: Malissa Hippo, MD;  [facility];  Service: Endoscopy;  Laterality: N/A;  1430pm   ESOPHAGOGASTRODUODENOSCOPY (EGD) WITH PROPOFOL N/A 02/22/2023   Procedure: ESOPHAGOGASTRODUODENOSCOPY (EGD) WITH PROPOFOL;  Surgeon: Dolores Frame, MD;  [facility]: [facility];  Service: Gastroenterology;  Laterality: N/A;  230pm, asa 2   GANGLION CYST EXCISION  12/17/2012   Procedure: REMOVAL GANGLION OF WRIST;  Surgeon: Nicki Reaper, MD;  Location: [facility];  Service: Orthopedics;  Laterality: Left;   HARDWARE REMOVAL Right 08/09/2014   Procedure: Placement Right Ankle Tight Rope, Removal Plate and Screws;  Surgeon: Eldred Manges, MD;  Location: [facility];  Service: Orthopedics;  Laterality: Right;   POLYPECTOMY  09/14/2020   Procedure: POLYPECTOMY;  Surgeon: Malissa Hippo, MD;  [facility];  Service: Endoscopy;;   PUBOVAGINAL SLING  2010   rt ankle surgery     SALPINGOOPHORECTOMY  1996   right   TRIGGER FINGER RELEASE  12/17/2012   Procedure: RELEASE TRIGGER FINGER/A-1 PULLEY;  Surgeon: Nicki Reaper, MD;  Location:  SUR[facility]ERY [facility];  Service: Orthopedics;  Laterality: Left;   TUBAL LIGATION  1992   VAGINA RECONSTRUCTION SURGERY     x 5 since 2010   VENTRAL HERNIA REPAIR  09/06/2006    Current Outpatient Medications  Medication Sig Dispense Refill   albuterol (VENTOLIN  HFA) 108 (90 Base) MCG/ACT inhaler Inhale 2 puffs into the lungs every 4 (four) hours as needed for wheezing or shortness of breath.      Cholecalciferol (VITAMIN D3) 125 MCG (5000 UT) CAPS Take 5,000 Units by mouth daily.      dicyclomine (BENTYL) 10 MG capsule TAKE 1 CAPSULE (10 MG TOTAL) BY MOUTH 2 (TWO) TIMES DAILY AS NEEDED FOR SPASMS. 180 capsule 1   doxycycline (VIBRAMYCIN) 100 MG capsule Take 100 mg by mouth 2 (two) times daily. Prn     estradiol (ESTRACE) 1 MG tablet Take 1 mg by mouth daily.     gabapentin (NEURONTIN) 400 MG capsule Take 1 capsule (400 mg total) by mouth at bedtime. 90 capsule 0   olmesartan (BENICAR)  5 MG tablet Take 5 mg by mouth daily.     ondansetron (ZOFRAN-ODT) 8 MG disintegrating tablet DISSOLVE 1 TABLET ON THE TONGUE EVERY 8 HOURS AS NEEDED FOR NAUSEA AND VOMITING 12 tablet 4   pantoprazole (PROTONIX) 40 MG tablet Take 1 tablet (40 mg total) by mouth daily. 90 tablet 3   triamcinolone ointment (KENALOG) 0.1 % Apply 1 Application topically 2 (two) times daily. 30 g 0   ondansetron (ZOFRAN-ODT) 4 MG disintegrating tablet Take 1 tablet (4 mg total) by mouth every 8 (eight) hours as needed for nausea or vomiting. (Patient not taking: Reported on 05/30/2023) 30 tablet 1   No current facility-administered medications for this visit.    Allergies as of 05/30/2023 - Review Complete 05/30/2023  Allergen Reaction Noted   Imitrex [sumatriptan]  04/29/2013   Cymbalta [duloxetine hcl] Rash 04/29/2013   Latex Rash 12/11/2012   Propoxyphene n-acetaminophen Nausea And Vomiting and Rash    Tramadol Rash 08/05/2014    Family History  Problem Relation Age of Onset   Lung cancer Mother    Cervical cancer Mother    Heart disease Father    Leukemia Father    Cancer Maternal Grandmother        bladder and colon cancer    Social History   Socioeconomic History   Marital status: Married    Spouse name: Not on file   Number of children: 2   Years of education: Not on  file   Highest education level: Not on file  Occupational History   Occupation: Airline pilot  Tobacco Use   Smoking status: Never    Passive exposure: Never   Smokeless tobacco: Never  Vaping Use   Vaping Use: Never used  Substance and Sexual Activity   Alcohol use: No   Drug use: No   Sexual activity: Yes    Birth control/protection: Surgical    Comment: hyst tubal  Other Topics Concern   Not on file  Social History Narrative   Not on file   Social Determinants of Health   Financial Resource Strain: Low Risk  (02/17/2021)   Overall Financial Resource Strain (CARDIA)    Difficulty of Paying Living Expenses: Not hard at all  Food Insecurity: No Food Insecurity (02/17/2021)   Hunger Vital Sign    Worried About Running Out of Food in the Last Year: Never true    Ran Out of Food in the Last Year: Never true  Transportation Needs: No Transportation Needs (02/17/2021)   PRAPARE - Administrator, Civil Service (Medical): No    Lack of Transportation (Non-Medical): No  Physical Activity: Inactive (02/17/2021)   Exercise Vital Sign    Days of Exercise per Week: 0 days    Minutes of Exercise per Session: 0 min  Stress: Stress Concern Present (02/17/2021)   [employer] of Occupational Health - Occupational Stress Questionnaire    Feeling of Stress : To some extent  Social Connections: Unknown (02/17/2021)   Social Connection and Isolation Panel [NHANES]    Frequency of Communication with Friends and Family: More than three times a week    Frequency of Social Gatherings with Friends and Family: Once a week    Attends Religious Services: Patient declined    Database administrator or Organizations: Patient declined    Attends Banker Meetings: Patient declined    Marital Status: Married   Review of systems General: negative for malaise, night sweats, fever, chills, weight loss Neck: Negative  for lumps, goiter, pain and significant neck swelling Resp: Negative  for cough, wheezing, dyspnea at rest CV: Negative for chest pain, leg swelling, palpitations, orthopnea GI: denies melena, hematochezia, vomiting, diarrhea, dysphagia, odyonophagia, early satiety or unintentional weight loss. +nausea +constipation +epigastric pain  MSK: Negative for joint pain or swelling, back pain, and muscle pain. Derm: Negative for itching or rash Psych: Denies depression, anxiety, memory loss, confusion. No homicidal or suicidal ideation.  Heme: Negative for prolonged bleeding, bruising easily, and swollen nodes. Endocrine: Negative for cold or heat intolerance, polyuria, polydipsia and goiter. Neuro: negative for tremor, gait imbalance, syncope and seizures. The remainder of the review of systems is noncontributory.  Physical Exam: BP 106/71 (BP Location: Left Arm, Patient Position: Sitting, Cuff Size: Large)   Pulse 81   Temp 98 F (36.7 C) (Oral)   Ht 5\' 3"  (1.6 m)   Wt 191 lb 9.6 oz (86.9 kg)   BMI 33.94 kg/m  General:   Alert and oriented. No distress noted. Pleasant and cooperative.  Head:  Normocephalic and atraumatic. Eyes:  Conjuctiva clear without scleral icterus. Mouth:  Oral mucosa pink and moist. Good dentition. No lesions. Heart: Normal rate and rhythm, s1 and s2 heart sounds present.  Lungs: Clear lung sounds in all lobes. Respirations equal and unlabored. Abdomen:  +BS, soft, and non-distended. TTP of epigastric region. No rebound or guarding. No HSM or masses noted. Derm: No palmar erythema or jaundice Msk:  Symmetrical without gross deformities. Normal posture. Extremities:  Without edema. Neurologic:  Alert and  oriented x4 Psych:  Alert and cooperative. Normal mood and affect.  Invalid input(s): "6 MONTHS"   ASSESSMENT: [name] is a 57 y.o. female presenting today for follow up of nausea/vomiting/gerd/epigastric pain  Patient has had ongoing epigastric pain with intermittent nausea and vomiting for which she has undergone EGD,  ultrasound, CT angio without findings to suggest etiology of her pain.  She notes that symptoms have improved some she has had less nausea and pain and no recent episodes of vomiting.  She is taking Zofran 8 mg as needed.  GERD is well-controlled on Protonix 40 mg once a day. Recommended previously to have GES which she declined.    Interestingly enough she does tell me that she has significant constipation and is only moving her bowels maybe once or twice a month.  Query of significant constipation is source of some of her upper GI symptoms.  Recommend continuing PPI and antiemetics, will start her on Linzess 72 mcg, I will provide samples of this and she can let me know how it is working for her.  I did make her aware that diarrhea and abdominal cramping is normal with initiation of Linzess and should taper off after the first few weeks.  She should continue taking medication despite diarrhea unless she is having more than 4 watery bowel movements or significant fecal urgency/incontinence.  She should let me know how Linzess is working for her, can send prescription if this is providing good results.  Encouraged to increase fruits/ veggies in diet and continue good water intake.  PLAN:  Continue with zofran 8mg  q8h  2. Continue protonix 40mg  daily  3. Linzess 65mcg-samples  4. Continue with good water intake  5. Increase water intake, aim for atleast 64 oz per day Increase fruits, veggies and whole grains, kiwi and prunes are especially good for constipation  All questions were answered, patient verbalized understanding and is in agreement with plan as outlined above.  Follow Up: 6 months   Garret Teale L. Jeanmarie Hubert, MSN, APRN, AGNP-C Adult-Gerontology Nurse Practitioner [facility]  I have reviewed the note and agree with the APP's assessment as described in this progress note  Katrinka Blazing, MD Gastroenterology and Hepatology [facility]enterology

## 2023-06-04 ENCOUNTER — Ambulatory Visit (INDEPENDENT_AMBULATORY_CARE_PROVIDER_SITE_OTHER): Payer: 59 | Admitting: Gastroenterology

## 2023-08-07 ENCOUNTER — Other Ambulatory Visit (INDEPENDENT_AMBULATORY_CARE_PROVIDER_SITE_OTHER): Payer: Self-pay

## 2023-08-07 ENCOUNTER — Telehealth (INDEPENDENT_AMBULATORY_CARE_PROVIDER_SITE_OTHER): Payer: Self-pay

## 2023-08-07 ENCOUNTER — Encounter (INDEPENDENT_AMBULATORY_CARE_PROVIDER_SITE_OTHER): Payer: Self-pay

## 2023-08-07 DIAGNOSIS — K5900 Constipation, unspecified: Secondary | ICD-10-CM

## 2023-08-07 DIAGNOSIS — R1013 Epigastric pain: Secondary | ICD-10-CM

## 2023-08-07 MED ORDER — LINACLOTIDE 72 MCG PO CAPS
72.0000 ug | ORAL_CAPSULE | Freq: Every day | ORAL | 5 refills | Status: AC
Start: 2023-08-07 — End: ?

## 2023-08-07 NOTE — Telephone Encounter (Signed)
I have submitted to the patient requested pharmacy.  Per patient Linzess 72 is working, needs sent to [facility].

## 2023-08-14 ENCOUNTER — Other Ambulatory Visit (HOSPITAL_COMMUNITY): Payer: Self-pay | Admitting: Obstetrics & Gynecology

## 2023-08-14 DIAGNOSIS — N6489 Other specified disorders of breast: Secondary | ICD-10-CM

## 2023-09-17 ENCOUNTER — Ambulatory Visit (HOSPITAL_COMMUNITY)
Admission: RE | Admit: 2023-09-17 | Discharge: 2023-09-17 | Disposition: A | Discharge status: Home / Self Care | Payer: 59 | Source: Ambulatory Visit | Attending: Obstetrics & Gynecology | Admitting: Obstetrics & Gynecology

## 2023-09-17 ENCOUNTER — Encounter (HOSPITAL_COMMUNITY): Payer: Self-pay

## 2023-09-17 DIAGNOSIS — N6489 Other specified disorders of breast: Secondary | ICD-10-CM | POA: Insufficient documentation

## 2023-10-08 ENCOUNTER — Ambulatory Visit: Payer: 59 | Admitting: Cardiology

## 2023-12-02 ENCOUNTER — Ambulatory Visit (INDEPENDENT_AMBULATORY_CARE_PROVIDER_SITE_OTHER): Payer: 59 | Admitting: Gastroenterology

## 2023-12-15 ENCOUNTER — Ambulatory Visit
Admission: EM | Admit: 2023-12-15 | Discharge: 2023-12-15 | Disposition: A | Discharge status: Home / Self Care | Payer: 59 | Attending: Nurse Practitioner | Admitting: Nurse Practitioner

## 2023-12-15 ENCOUNTER — Encounter: Payer: Self-pay | Admitting: Emergency Medicine

## 2023-12-15 DIAGNOSIS — J029 Acute pharyngitis, unspecified: Secondary | ICD-10-CM | POA: Insufficient documentation

## 2023-12-15 DIAGNOSIS — B349 Viral infection, unspecified: Secondary | ICD-10-CM | POA: Insufficient documentation

## 2023-12-15 LAB — POCT INFLUENZA A/B
Influenza A, POC: NEGATIVE
Influenza B, POC: NEGATIVE

## 2023-12-15 LAB — POCT RAPID STREP A (OFFICE): Rapid Strep A Screen: NEGATIVE

## 2023-12-15 MED ORDER — LIDOCAINE VISCOUS HCL 2 % MT SOLN: OROMUCOSAL | 0 refills | Status: DC

## 2023-12-15 NOTE — ED Provider Notes (Signed)
 [facility]    CSN: [id number] Arrival date & time: 12/15/23  1006      History   Chief Complaint No chief complaint on file.   HPI [name] is a 58 y.o. female.   The history is provided by the patient.   Patient presents for complaints of sore throat, cough, body aches, and low-grade fevers.  She states throat pain has been present for the past week, with cough and low-grade fever starting over the past 24 hours.  Denies headache, ear pain, nasal congestion, runny nose, wheezing, difficulty breathing, chest pain, abdominal pain, nausea, vomiting, diarrhea, or rash.  Patient reports she has been taking over-the-counter medications with minimal relief.  States that over the past month, she has had episodes of sore throat.  States that she was seen at another local urgent care at that time, and all testing was negative.  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 [facility])     Patient Active Problem List   Diagnosis Date Noted   Generalized postprandial abdominal pain 02/22/2023   Retinal hole, left 05/22/2022   Nuclear sclerotic cataract of both eyes 05/22/2022   Diarrhea 01/04/2022   Vitamin B 12 deficiency 05/19/2021   Tired 04/14/2021   Anxiety and depression 02/17/2021   Encounter for screening fecal occult blood testing 02/17/2021   Encounter for well woman exam with routine  gynecological exam 02/17/2021   Current use of estrogen therapy 02/17/2021   Urine frequency 04/13/2020   Nausea and vomiting 10/08/2019   CRP elevated 10/05/2019   Noninfectious jejunitis 06/29/2019   Anemia 06/29/2019   Anemia due to vitamin B12 deficiency    Nausea    Gastroesophageal reflux disease 10/23/2018   Abdominal pain, epigastric 10/23/2018   Chest pain 10/16/2018   Stiffness of joint, not elsewhere classified, ankle and foot 09/07/2014   Pain in joint, ankle and foot 09/07/2014   Difficulty walking 09/07/2014   Right ankle instability 08/09/2014   Hematochezia 08/11/2013   Constipation 08/11/2013   Rheumatoid arthritis (HCC) 02/20/2010   KNEE PAIN 02/20/2010   FIBROMYALGIA 02/20/2010    Past Surgical History:  Procedure Laterality Date   ABDOMINAL HYSTERECTOMY  1994   APPENDECTOMY  1996   BIOPSY  12/01/2018   Procedure: BIOPSY;  Surgeon: Golda Claudis PENNER, MD;  [facility]: [facility];  Service: Endoscopy;;  gastric    BIOPSY N/A 10/16/2019   Procedure: DUODENUM and JEJUNUM BIOPSY;  Surgeon: Golda Claudis PENNER, MD;  Location: [facility];  Service: Endoscopy;  Laterality: N/A;   COLONOSCOPY N/A 05/08/2013   Procedure: COLONOSCOPY;  Surgeon: Claudis PENNER Golda, MD;  Location: [facility];  Service: Endoscopy;  Laterality: N/A;  240   COLONOSCOPY N/A 09/23/2015   Procedure: COLONOSCOPY;  Surgeon: Claudis PENNER Golda, MD;  [facility]: [facility];  Service: Endoscopy;  Laterality: N/A;  825   COLONOSCOPY WITH PROPOFOL  N/A 09/14/2020   Procedure: COLONOSCOPY WITH PROPOFOL ;  Surgeon: Golda Claudis PENNER, MD;  [facility]: [facility];  Service: Endoscopy;  Laterality: N/A;  730   CYSTOSCOPY WITH HYDRODISTENSION AND BIOPSY  04/01/2007   ECTOPIC PREGNANCY SURGERY  1995   left tube and ovary removed   ENTEROSCOPY N/A 10/16/2019   Procedure: PUSH ENTEROSCOPY;  Surgeon: Golda Claudis PENNER, MD;  [facility]: [facility];  Service: Endoscopy;  Laterality: N/A;   ESOPHAGOGASTRODUODENOSCOPY  N/A 12/01/2018   Procedure: ESOPHAGOGASTRODUODENOSCOPY (EGD);  Surgeon: Golda Claudis PENNER, MD;  [facility];  Service: Endoscopy;  Laterality: N/A;  12:45   ESOPHAGOGASTRODUODENOSCOPY (EGD) WITH PROPOFOL  N/A 10/16/2019   Procedure: ESOPHAGOGASTRODUODENOSCOPY (EGD) WITH PROPOFOL ;  Surgeon: Golda Claudis PENNER, MD;  [facility];  Service: Endoscopy;  Laterality: N/A;  1430pm   ESOPHAGOGASTRODUODENOSCOPY (EGD) WITH PROPOFOL  N/A 02/22/2023   Procedure: ESOPHAGOGASTRODUODENOSCOPY (EGD) WITH PROPOFOL ;  Surgeon: Eartha Angelia Sieving, MD;  [facility]: [facility];  Service: Gastroenterology;  Laterality: N/A;  230pm, asa 2   GANGLION CYST EXCISION  12/17/2012   Procedure: REMOVAL GANGLION OF WRIST;  Surgeon: Arley JONELLE Curia, MD;  Location: [facility];  Service: Orthopedics;  Laterality: Left;   HARDWARE REMOVAL Right 08/09/2014   Procedure: Placement Right Ankle Tight Rope, Removal Plate and Screws;  Surgeon: Oneil JAYSON Herald, MD;  Location: [facility];  Service: Orthopedics;  Laterality: Right;   POLYPECTOMY  09/14/2020   Procedure: POLYPECTOMY;  Surgeon: Golda Claudis PENNER, MD;  [facility]: [facility];  Service: Endoscopy;;   PUBOVAGINAL SLING  2010   rt ankle surgery     SALPINGOOPHORECTOMY  1996   right   TRIGGER FINGER RELEASE  12/17/2012   Procedure: RELEASE TRIGGER FINGER/A-1 PULLEY;  Surgeon: Arley JONELLE Curia, MD;  Location: [facility];  Service: Orthopedics;  Laterality: Left;   TUBAL LIGATION  1992   VAGINA RECONSTRUCTION SURGERY     x 5 since 2010   VENTRAL HERNIA REPAIR  09/06/2006    OB History     Gravida  3   Para  2   Term  2   Preterm  0   AB  1   Living  2      SAB      IAB      Ectopic  1   Multiple      Live Births  2            Home Medications    Prior to Admission medications   Medication Sig Start Date End Date Taking? Authorizing Provider  lidocaine  (XYLOCAINE ) 2 % solution Gargle and spit 5 mL every 6 hours as  needed for throat pain or discomfort. 12/15/23  Yes Leath-Warren, Etta PARAS, NP  albuterol  (VENTOLIN  HFA) 108 (90 Base) MCG/ACT inhaler Inhale 2 puffs into the lungs every 4 (four) hours as needed for wheezing or shortness of breath.  12/22/19   [provider]  Cholecalciferol (VITAMIN D3) 125 MCG (5000 UT) CAPS Take 5,000 Units by mouth daily.     [provider]  dicyclomine  (BENTYL ) 10 MG capsule TAKE 1 CAPSULE (10 MG TOTAL) BY MOUTH 2 (TWO) TIMES DAILY AS NEEDED FOR SPASMS. 04/01/23   Carlan, Chelsea L, NP  estradiol  (ESTRACE ) 1 MG tablet Take 1 mg by mouth daily.    [provider]  gabapentin  (NEURONTIN ) 400 MG capsule  Take 1 capsule (400 mg total) by mouth at bedtime. 12/25/16   Setzer, Veva CROME, NP  linaclotide  (LINZESS ) 72 MCG capsule Take 1 capsule (72 mcg total) by mouth daily before breakfast. 08/07/23   Carlan, Chelsea L, NP  olmesartan (BENICAR) 5 MG tablet Take 5 mg by mouth daily. 12/21/21   [provider]  ondansetron  (ZOFRAN -ODT) 8 MG disintegrating tablet DISSOLVE 1 TABLET ON THE TONGUE EVERY 8 HOURS AS NEEDED FOR NAUSEA AND VOMITING 05/30/23   Carlan, Chelsea L, NP  pantoprazole  (PROTONIX ) 40 MG tablet Take 1 tablet (40 mg total) by mouth daily. 01/31/23   Mariette Mitzie CROME, NP    Family History Family History  Problem Relation Age of Onset   Lung cancer Mother    Cervical cancer Mother    Heart disease Father    Leukemia Father    Cancer Maternal Grandmother        bladder and colon cancer    Social History Social History   Tobacco Use   Smoking status: Never    Passive exposure: Never   Smokeless tobacco: Never  Vaping Use   Vaping status: Never Used  Substance Use Topics   Alcohol use: No   Drug use: No     Allergies   Imitrex [sumatriptan], Cymbalta [duloxetine hcl], Latex, Propoxyphene n-acetaminophen , and Tramadol   Review of Systems Review of Systems Per HPI  Physical Exam Triage Vital Signs ED Triage Vitals   Encounter Vitals Group     BP 12/15/23 1015 137/74     Systolic BP Percentile --      Diastolic BP Percentile --      Pulse Rate 12/15/23 1015 80     Resp 12/15/23 1015 18     Temp 12/15/23 1015 99.1 F (37.3 C)     Temp Source 12/15/23 1015 Oral     SpO2 12/15/23 1015 94 %     Weight --      Height --      Head Circumference --      Peak Flow --      Pain Score 12/15/23 1016 10     Pain Loc --      Pain Education --      Exclude from Growth Chart --    No data found.  Updated Vital Signs BP 137/74 (BP Location: Right Arm)   Pulse 80   Temp 99.1 F (37.3 C) (Oral)   Resp 18   SpO2 94%   Visual Acuity Right Eye Distance:   Left Eye Distance:   Bilateral Distance:    Right Eye Near:   Left Eye Near:    Bilateral Near:     Physical Exam Vitals and nursing note reviewed.  Constitutional:      General: She is not in acute distress.    Appearance: Normal appearance.  HENT:     Head: Normocephalic.     Right Ear: Tympanic membrane, ear canal and external ear normal.     Left Ear: Tympanic membrane, ear canal and external ear normal.     Nose: Nose normal.     Right Turbinates: Enlarged and swollen.     Left Turbinates: Enlarged and swollen.     Right Sinus: No maxillary sinus tenderness or frontal sinus tenderness.     Left Sinus: No maxillary sinus tenderness or frontal sinus tenderness.     Mouth/Throat:     Lips: Pink.     Mouth: Mucous membranes are moist.  Pharynx: Uvula midline. Posterior oropharyngeal erythema present. No pharyngeal swelling, oropharyngeal exudate or uvula swelling.     Tonsils: 1+ on the right. 1+ on the left.  Eyes:     Extraocular Movements: Extraocular movements intact.     Conjunctiva/sclera: Conjunctivae normal.     Pupils: Pupils are equal, round, and reactive to light.  Cardiovascular:     Rate and Rhythm: Normal rate and regular rhythm.     Pulses: Normal pulses.     Heart sounds: Normal heart sounds.  Pulmonary:      Effort: Pulmonary effort is normal. No respiratory distress.     Breath sounds: Normal breath sounds. No stridor. No wheezing, rhonchi or rales.  Abdominal:     General: Bowel sounds are normal.     Palpations: Abdomen is soft.     Tenderness: There is no abdominal tenderness.  Musculoskeletal:     Cervical back: Normal range of motion.  Lymphadenopathy:     Cervical: No cervical adenopathy.  Skin:    General: Skin is warm and dry.  Neurological:     General: No focal deficit present.     Mental Status: She is alert and oriented to person, place, and time.  Psychiatric:        Mood and Affect: Mood normal.        Behavior: Behavior normal.      UC Treatments / Results  Labs (all labs ordered are listed, but only abnormal results are displayed) Labs Reviewed  CULTURE, GROUP A STREP [facility])  POCT RAPID STREP A (OFFICE)  POCT INFLUENZA A/B    EKG   Radiology No results found.  Procedures Procedures (including critical care time)  Medications Ordered in UC Medications - No data to display  Initial Impression / Assessment and Plan / UC Course  I have reviewed the triage vital signs and the nursing notes.  Pertinent labs & imaging results that were available during my care of the patient were reviewed by me and considered in my medical decision making (see chart for details).  The rapid strep test and influenza test were negative.  Throat culture is pending.  Suspect symptoms are of viral etiology, although patient states she has been dealing with sore throat on and off for the past month.  Viscous lidocaine  2% provided for throat pain.  Supportive care recommendations were provided and discussed with the patient to include fluids, rest, over-the-counter analgesics, warm salt water  rinses, and a soft diet.  Patient was advised that if symptoms continue to persist and testing remains negative, recommend that she follow-up with her PCP to discuss possible referral to ENT for  further evaluation.  Patient was in agreement with this plan of care and verbalizes understanding.  All questions were answered.  Patient stable for discharge.  Work note was provided.  Final Clinical Impressions(s) / UC Diagnoses   Final diagnoses:  Sore throat  Viral illness     Discharge Instructions      The rapid strep test and influenza test were negative.  Throat culture is pending.  You will be contacted if the pending test result is abnormal. Take medication as prescribed. Increase fluids and allow for plenty of rest. May take over-the-counter Tylenol  or ibuprofen  as needed for pain, fever, or general discomfort. Warm salt water  gargles 3-4 times daily as needed. May use Chloraseptic throat spray and throat lozenges as needed for throat pain or discomfort. Recommend a soft diet while symptoms persist, this includes soup, broth, yogurt,  pudding, or Jell-O. If your test results are all negative, and you continue to experience symptoms, it is recommended that you follow-up with your PCP to discuss symptoms and possible referral to ENT. Follow-up as needed.     ED Prescriptions     Medication Sig Dispense Auth. Provider   lidocaine  (XYLOCAINE ) 2 % solution Gargle and spit 5 mL every 6 hours as needed for throat pain or discomfort. 100 mL Leath-Warren, Etta PARAS, NP      PDMP not reviewed this encounter.   Gilmer Etta PARAS, NP 12/15/23 1126

## 2023-12-15 NOTE — ED Triage Notes (Signed)
Sore throat x 1 week

## 2023-12-15 NOTE — Discharge Instructions (Signed)
 The rapid strep test and influenza test were negative.  Throat culture is pending.  You will be contacted if the pending test result is abnormal. Take medication as prescribed. Increase fluids and allow for plenty of rest. May take over-the-counter Tylenol  or ibuprofen  as needed for pain, fever, or general discomfort. Warm salt water  gargles 3-4 times daily as needed. May use Chloraseptic throat spray and throat lozenges as needed for throat pain or discomfort. Recommend a soft diet while symptoms persist, this includes soup, broth, yogurt, pudding, or Jell-O. If your test results are all negative, and you continue to experience symptoms, it is recommended that you follow-up with your PCP to discuss symptoms and possible referral to ENT. Follow-up as needed.

## 2023-12-18 LAB — CULTURE, GROUP A STREP (THRC)

## 2024-01-02 ENCOUNTER — Ambulatory Visit (INDEPENDENT_AMBULATORY_CARE_PROVIDER_SITE_OTHER): Payer: 59 | Admitting: Gastroenterology

## 2024-01-02 ENCOUNTER — Encounter (INDEPENDENT_AMBULATORY_CARE_PROVIDER_SITE_OTHER): Payer: Self-pay | Admitting: Gastroenterology

## 2024-01-02 VITALS — BP 119/76 | HR 89 | Temp 97.5°F | Ht 63.0 in | Wt 194.2 lb

## 2024-01-02 DIAGNOSIS — R112 Nausea with vomiting, unspecified: Secondary | ICD-10-CM

## 2024-01-02 DIAGNOSIS — K581 Irritable bowel syndrome with constipation: Secondary | ICD-10-CM | POA: Diagnosis not present

## 2024-01-02 DIAGNOSIS — R1010 Upper abdominal pain, unspecified: Secondary | ICD-10-CM | POA: Diagnosis not present

## 2024-01-02 DIAGNOSIS — G8929 Other chronic pain: Secondary | ICD-10-CM

## 2024-01-02 DIAGNOSIS — K589 Irritable bowel syndrome without diarrhea: Secondary | ICD-10-CM | POA: Insufficient documentation

## 2024-01-02 MED ORDER — HYOSCYAMINE SULFATE 0.125 MG SL SUBL: 0.1250 mg | SUBLINGUAL_TABLET | SUBLINGUAL | 0 refills | Status: DC | PRN

## 2024-01-02 NOTE — Patient Instructions (Addendum)
Stop using high dose aspirin including Goody/BC powders, NSAIDs such as Aleve, ibuprofen, naproxen, Motrin, Voltaren or Advil (even the topical ones).  Can take Tylenol instead Continue Linzess 72 mcg qday Stop dicyclomine Start Levsin 1 tablet q8h as needed for abdominal pain If worsening abdominal pain can consider upper GI series and capsule endoscopy

## 2024-01-02 NOTE — Progress Notes (Signed)
Katrinka Blazing, M.D. Gastroenterology & Hepatology [facility] Gastroenterology [address] [place], [place] [address]  Primary Care Physician: Carylon Perches, MD [address] [place] [address]  I will communicate my assessment and recommendations to the referring MD via EMR.  Problems: Chronic upper abdominal pain IBS-C Chronic nausea  History of Present Illness: [name] is a 58 y.o. female with past medical history of arthritis, asthma, cervical cancer, fibromyalgia, GERD, high cholesterol, HTN, RA and jejunitis  who presents for follow up of IBS-C and chronic upper abdominal pain.  The patient was last seen on 05/30/2023. At that time, the patient was started on Linzess 72 mcg every day and continue on Protonix 40 mg daily, Zofran 8 mg as needed.  Patient reports that she has felt intermittent episodes of pain in her upper abdomen. She has been taking Linzess 72 mcg per day, has 3-4 normal Bms per day. She takes Bentyl once a day as this causes constipation. Still has some nausea issues occasionally but no vomiting.  This is improved with the use of Zofran.  The patient denies having any vomiting, fever, chills, hematochezia, melena, hematemesis, diarrhea, jaundice, pruritus or weight loss.  CT Angio A/P in may 2024 which showed no acute abnormality, hepatic steatosis, recommended GES but this has not been completed.  Has been on Benicar since 2023, but this was started after her GI symptoms were present.  She takes NSAIDs, usually ibuprofen for abdominal pain or other pains in her joints.  Last Colonoscopy:09/14/20- The examined portion of the ileum was normal. - One 6 mm polyp at the splenic flexure,tubular adenoma - One small polyp in the sigmoid colon. Tubular adenoma - Diverticulosis in the sigmoid colon. Last Endoscopy:02/2023- Normal esophagus.                           - Normal stomach. Biopsied-normal                             - Normal examined duodenum. Biopsied-  Past Medical History: 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 [facility])     Past Surgical History: Past Surgical History:  Procedure Laterality Date   ABDOMINAL HYSTERECTOMY  1994   APPENDECTOMY  1996   BIOPSY  12/01/2018   Procedure: BIOPSY;  Surgeon: Malissa Hippo, MD;  [facility];  Service: Endoscopy;;  gastric    BIOPSY N/A 10/16/2019   Procedure: DUODENUM and JEJUNUM BIOPSY;  Surgeon: Malissa Hippo, MD;  [facility]: [facility];  Service: Endoscopy;  Laterality: N/A;   COLONOSCOPY N/A 05/08/2013   Procedure: COLONOSCOPY;  Surgeon: Malissa Hippo, MD;  [facility];  Service: Endoscopy;  Laterality: N/A;  240   COLONOSCOPY N/A 09/23/2015   Procedure: COLONOSCOPY;  Surgeon: Malissa Hippo, MD;  [facility];  Service: Endoscopy;  Laterality: N/A;  825   COLONOSCOPY WITH PROPOFOL N/A 09/14/2020   Procedure: COLONOSCOPY WITH PROPOFOL;  Surgeon: Malissa Hippo, MD;  [facility];  Service: Endoscopy;  Laterality: N/A;  730   CYSTOSCOPY WITH HYDRODISTENSION AND BIOPSY  04/01/2007   ECTOPIC PREGNANCY SURGERY  1995   left tube and ovary removed   ENTEROSCOPY N/A 10/16/2019   Procedure: PUSH ENTEROSCOPY;  Surgeon: Malissa Hippo, MD;  [facility];  Service: Endoscopy;  Laterality: N/A;    ESOPHAGOGASTRODUODENOSCOPY N/A 12/01/2018   Procedure: ESOPHAGOGASTRODUODENOSCOPY (EGD);  Surgeon: Malissa Hippo, MD;  [facility];  Service: Endoscopy;  Laterality: N/A;  12:45   ESOPHAGOGASTRODUODENOSCOPY (EGD) WITH PROPOFOL N/A 10/16/2019   Procedure: ESOPHAGOGASTRODUODENOSCOPY (EGD) WITH PROPOFOL;  Surgeon: Malissa Hippo, MD;  [facility];  Service: Endoscopy;  Laterality: N/A;  1430pm   ESOPHAGOGASTRODUODENOSCOPY (EGD) WITH PROPOFOL N/A 02/22/2023   Procedure: ESOPHAGOGASTRODUODENOSCOPY (EGD) WITH PROPOFOL;  Surgeon: Dolores Frame, MD;  [facility];  Service: Gastroenterology;  Laterality: N/A;  230pm, asa 2   GANGLION CYST EXCISION  12/17/2012   Procedure: REMOVAL GANGLION OF WRIST;  Surgeon: Nicki Reaper, MD;  Location: [facility];  Service: Orthopedics;  Laterality: Left;   HARDWARE REMOVAL Right 08/09/2014   Procedure: Placement Right Ankle Tight Rope, Removal Plate and Screws;  Surgeon: Eldred Manges, MD;  Location: [facility];  Service: Orthopedics;  Laterality: Right;   POLYPECTOMY  09/14/2020   Procedure: POLYPECTOMY;  Surgeon: Malissa Hippo, MD;  [facility];  Service: Endoscopy;;   PUBOVAGINAL SLING  2010   rt ankle surgery     SALPINGOOPHORECTOMY  1996   right   TRIGGER FINGER RELEASE  12/17/2012   Procedure: RELEASE TRIGGER FINGER/A-1 PULLEY;  Surgeon: Nicki Reaper, MD;  Location: [facility];  Service: Orthopedics;  Laterality: Left;   TUBAL LIGATION  1992   VAGINA RECONSTRUCTION SURGERY     x 5 since 2010   VENTRAL HERNIA REPAIR  09/06/2006    Family History: Family History  Problem Relation Age of Onset   Lung cancer Mother    Cervical cancer Mother    Heart disease Father    Leukemia Father    Cancer Maternal Grandmother        bladder and colon cancer    Social History: Social History   Tobacco Use  Smoking Status Never   Passive exposure: Never  Smokeless Tobacco Never    Social History   Substance and Sexual Activity  Alcohol Use No   Social History   Substance and Sexual Activity  Drug Use No    Allergies: Allergies  Allergen Reactions   Imitrex [Sumatriptan]     Causes a migraine   Cymbalta [Duloxetine Hcl] Rash   Latex Rash   Propoxyphene N-Acetaminophen Nausea And Vomiting and Rash   Tramadol Rash    Medications: Current Outpatient Medications  Medication Sig Dispense Refill   albuterol (VENTOLIN HFA) 108 (90 Base) MCG/ACT inhaler Inhale 2 puffs into the lungs every 4 (four) hours as needed for wheezing or shortness of breath.      Cholecalciferol (VITAMIN D3) 125 MCG (5000 UT) CAPS Take 5,000 Units by mouth daily.      dicyclomine (BENTYL) 10 MG  capsule TAKE 1 CAPSULE (10 MG TOTAL) BY MOUTH 2 (TWO) TIMES DAILY AS NEEDED FOR SPASMS. 180 capsule 1   estradiol (ESTRACE) 1 MG tablet Take 1 mg by mouth daily.     gabapentin (NEURONTIN) 400 MG capsule Take 1 capsule (400 mg total) by mouth at bedtime. 90 capsule 0   linaclotide (LINZESS) 72 MCG capsule Take 1 capsule (72 mcg total) by mouth daily before breakfast. 30 capsule 5   olmesartan (BENICAR) 5 MG tablet Take 5 mg by mouth daily.     ondansetron (ZOFRAN-ODT) 8 MG disintegrating tablet DISSOLVE 1 TABLET ON THE TONGUE EVERY 8 HOURS AS NEEDED FOR NAUSEA AND VOMITING 20 tablet 2   pantoprazole (PROTONIX) 40 MG tablet Take 1 tablet (40 mg total) by mouth daily. 90 tablet 3   No current facility-administered medications for this visit.    Review of Systems: GENERAL: negative for malaise, night sweats HEENT: No changes in hearing or vision, no nose bleeds or other nasal problems. NECK: Negative for lumps, goiter, pain and significant neck swelling RESPIRATORY: Negative for cough, wheezing CARDIOVASCULAR: Negative for chest pain, leg swelling, palpitations, orthopnea GI: SEE HPI MUSCULOSKELETAL: Negative for joint pain or swelling, back pain, and muscle pain. SKIN: Negative for  lesions, rash PSYCH: Negative for sleep disturbance, mood disorder and recent psychosocial stressors. HEMATOLOGY Negative for prolonged bleeding, bruising easily, and swollen nodes. ENDOCRINE: Negative for cold or heat intolerance, polyuria, polydipsia and goiter. NEURO: negative for tremor, gait imbalance, syncope and seizures. The remainder of the review of systems is noncontributory.   Physical Exam: BP 119/76 (BP Location: Left Arm, Patient Position: Sitting, Cuff Size: Large)   Pulse 89   Temp (!) 97.5 F (36.4 C) (Temporal)   Ht 5\' 3"  (1.6 m)   Wt 194 lb 3.2 oz (88.1 kg)   BMI 34.40 kg/m  GENERAL: The patient is AO x3, in no acute distress. HEENT: Head is normocephalic and atraumatic. EOMI are intact. Mouth is well hydrated and without lesions. NECK: Supple. No masses LUNGS: Clear to auscultation. No presence of rhonchi/wheezing/rales. Adequate chest expansion HEART: RRR, normal s1 and s2. ABDOMEN: tender to palpation in epigastric area, no guarding, no peritoneal signs, and nondistended. BS +. No masses. EXTREMITIES: Without any cyanosis, clubbing, rash, lesions or edema. NEUROLOGIC: AOx3, no focal motor deficit. SKIN: no jaundice, no rashes  Imaging/Labs: as above  I personally reviewed and interpreted the available labs, imaging and endoscopic files.  Impression and Plan: [name] is a 58 y.o. female with past medical history of arthritis, asthma, cervical cancer, fibromyalgia, GERD, high cholesterol, HTN, RA and jejunitis  who presents for follow up of IBS-C and chronic upper abdominal pain.  Patient has presented chronic recurrent episodes of abdominal pain.  This has been evaluated with imaging and endoscopic investigations, which have been unremarkable.  Notably, she has been taking NSAIDs intermittently, which could have led to intermittent inflammation.  I advised her to avoid using these medications as much as possible.  Also, it was discussed that her symptoms  could be related to bowel hypersensitivity, for which we will attempt using a different antispasmodic such as Levsin as needed.  She should stop taking dicyclomine for now.  The patient reports that her abdominal pain is less frequent than before so she would like to hold off on doing any more investigations for now, which I find reasonable.  Her IBS-C seems to be well-controlled with the use of Linzess daily, we will continue with the same  dosage.  -Stop using high dose aspirin including Goody/BC powders, NSAIDs such as Aleve, ibuprofen, naproxen, Motrin, Voltaren or Advil (even the topical ones).  Can take Tylenol instead - Continue Linzess 72 mcg qday -Stop dicyclomine - Start Levsin 1 tablet q8h as needed for abdominal pain -If worsening abdominal pain can consider upper GI series and capsule endoscopy  All questions were answered.      Katrinka Blazing, MD Gastroenterology and Hepatology [facility]

## 2024-02-03 ENCOUNTER — Other Ambulatory Visit (HOSPITAL_COMMUNITY): Payer: Self-pay | Admitting: Obstetrics & Gynecology

## 2024-02-03 DIAGNOSIS — N6489 Other specified disorders of breast: Secondary | ICD-10-CM

## 2024-02-10 ENCOUNTER — Other Ambulatory Visit (INDEPENDENT_AMBULATORY_CARE_PROVIDER_SITE_OTHER): Payer: Self-pay | Admitting: Gastroenterology

## 2024-03-11 DIAGNOSIS — H43392 Other vitreous opacities, left eye: Secondary | ICD-10-CM | POA: Diagnosis not present

## 2024-03-24 ENCOUNTER — Encounter (HOSPITAL_COMMUNITY): Payer: Self-pay

## 2024-03-24 ENCOUNTER — Ambulatory Visit (HOSPITAL_COMMUNITY)
Admission: RE | Admit: 2024-03-24 | Discharge: 2024-03-24 | Disposition: A | Discharge status: Home / Self Care | Payer: 59 | Source: Ambulatory Visit | Attending: Obstetrics & Gynecology | Admitting: Obstetrics & Gynecology

## 2024-03-24 DIAGNOSIS — N6489 Other specified disorders of breast: Secondary | ICD-10-CM

## 2024-03-24 DIAGNOSIS — R928 Other abnormal and inconclusive findings on diagnostic imaging of breast: Secondary | ICD-10-CM | POA: Diagnosis not present

## 2024-03-24 DIAGNOSIS — N644 Mastodynia: Secondary | ICD-10-CM | POA: Diagnosis not present

## 2024-03-24 DIAGNOSIS — R92323 Mammographic fibroglandular density, bilateral breasts: Secondary | ICD-10-CM | POA: Diagnosis not present

## 2024-03-25 ENCOUNTER — Encounter: Payer: Self-pay | Admitting: Obstetrics & Gynecology

## 2024-04-07 ENCOUNTER — Other Ambulatory Visit (INDEPENDENT_AMBULATORY_CARE_PROVIDER_SITE_OTHER): Payer: Self-pay | Admitting: *Deleted

## 2024-04-07 ENCOUNTER — Telehealth (INDEPENDENT_AMBULATORY_CARE_PROVIDER_SITE_OTHER): Payer: Self-pay | Admitting: Gastroenterology

## 2024-04-07 ENCOUNTER — Other Ambulatory Visit (INDEPENDENT_AMBULATORY_CARE_PROVIDER_SITE_OTHER): Payer: Self-pay | Admitting: Gastroenterology

## 2024-04-07 DIAGNOSIS — R1010 Upper abdominal pain, unspecified: Secondary | ICD-10-CM

## 2024-04-07 MED ORDER — HYOSCYAMINE SULFATE 0.125 MG SL SUBL: 0.1250 mg | SUBLINGUAL_TABLET | Freq: Four times a day (QID) | SUBLINGUAL | 3 refills | Status: DC | PRN

## 2024-04-07 NOTE — Telephone Encounter (Signed)
 Pt notified on voicmail

## 2024-04-07 NOTE — Telephone Encounter (Signed)
 Medication sent.

## 2024-04-07 NOTE — Telephone Encounter (Signed)
 Patient came to front desk saying she has transferred pharmacies from [facility] to [facility] on [address]. She is needing a refill on her Hyoscyamine  0.125 mg. [phone]

## 2024-04-07 NOTE — Telephone Encounter (Signed)
 Patient last seen 01/02/24 and started on med at that visit.   [facility] [address] [employer]

## 2024-04-08 ENCOUNTER — Encounter (INDEPENDENT_AMBULATORY_CARE_PROVIDER_SITE_OTHER): Payer: Self-pay

## 2024-04-16 NOTE — Telephone Encounter (Signed)
 Called patient to see if she got medication because we never received anything from pharmacy. She said she got hyosyamine tablets that did not dissolve and only took one because it caused her to have nausea. I called pharmacy because sl tabs were sent in and the pharmacy told me she received 0.125mg  SL #180. I called patient back and she said in the past she got a different one and it helped but this one made her sick. She wanted to bring the medication to the office tomorrow to show it to me.

## 2024-04-17 NOTE — Telephone Encounter (Signed)
 [phone] [name] Enter - husband brought in patient's medication. She had hyosycamine 0.125mg  sublingual in the past that was white tablets that she said worked for her but the ones she just picked up are green and she said they caused nausea and headache and she could not get to dissolve. I called the pharmacy and was told she ws given sublingual. Patient is wanting to know if she can get the ones that are white sent in

## 2024-04-20 NOTE — Telephone Encounter (Signed)
 Pt called and notified and I let her know I called [facility] 5 times today and unable to get through will try again on Wednesday since I'm off tomorrow.

## 2024-04-22 ENCOUNTER — Other Ambulatory Visit (INDEPENDENT_AMBULATORY_CARE_PROVIDER_SITE_OTHER): Payer: Self-pay | Admitting: *Deleted

## 2024-04-22 DIAGNOSIS — R1010 Upper abdominal pain, unspecified: Secondary | ICD-10-CM

## 2024-04-22 MED ORDER — HYOSCYAMINE SULFATE 0.125 MG SL SUBL: 0.1250 mg | SUBLINGUAL_TABLET | Freq: Four times a day (QID) | SUBLINGUAL | 0 refills | Status: DC | PRN

## 2024-04-22 NOTE — Telephone Encounter (Signed)
 Called and spoke to pharmacist at [facility] and was told he did not have white tablets in stock and unable to order them. I spoke to patient and she said send to [facility]. She is aware she will need to use good rx card to get since she just picked up #180 from [facility]. It was $12 with good rx and I also told her to make sure the tablets were the white ones before leaving pharmacy. I put a comment on rx to dispense white tablets and apply good rx. Patient verbalized understanding.

## 2024-05-31 ENCOUNTER — Encounter (INDEPENDENT_AMBULATORY_CARE_PROVIDER_SITE_OTHER): Payer: Self-pay | Admitting: Gastroenterology

## 2024-05-31 ENCOUNTER — Other Ambulatory Visit (INDEPENDENT_AMBULATORY_CARE_PROVIDER_SITE_OTHER): Payer: Self-pay | Admitting: Gastroenterology

## 2024-05-31 DIAGNOSIS — R1010 Upper abdominal pain, unspecified: Secondary | ICD-10-CM

## 2024-06-01 ENCOUNTER — Other Ambulatory Visit (INDEPENDENT_AMBULATORY_CARE_PROVIDER_SITE_OTHER): Payer: Self-pay | Admitting: Gastroenterology

## 2024-06-01 DIAGNOSIS — R1010 Upper abdominal pain, unspecified: Secondary | ICD-10-CM

## 2024-06-01 MED ORDER — HYOSCYAMINE SULFATE 0.125 MG SL SUBL: 0.1250 mg | SUBLINGUAL_TABLET | Freq: Four times a day (QID) | SUBLINGUAL | 3 refills | Status: AC | PRN

## 2024-06-16 DIAGNOSIS — R030 Elevated blood-pressure reading, without diagnosis of hypertension: Secondary | ICD-10-CM | POA: Diagnosis not present

## 2024-06-16 DIAGNOSIS — E669 Obesity, unspecified: Secondary | ICD-10-CM | POA: Diagnosis not present

## 2024-06-16 DIAGNOSIS — J019 Acute sinusitis, unspecified: Secondary | ICD-10-CM | POA: Diagnosis not present

## 2024-06-16 DIAGNOSIS — Z6834 Body mass index (BMI) 34.0-34.9, adult: Secondary | ICD-10-CM | POA: Diagnosis not present

## 2024-06-29 ENCOUNTER — Encounter (HOSPITAL_COMMUNITY): Payer: Self-pay | Admitting: Internal Medicine

## 2024-06-29 ENCOUNTER — Other Ambulatory Visit (HOSPITAL_COMMUNITY): Payer: Self-pay | Admitting: Internal Medicine

## 2024-06-29 DIAGNOSIS — G5622 Lesion of ulnar nerve, left upper limb: Secondary | ICD-10-CM | POA: Diagnosis not present

## 2024-06-29 DIAGNOSIS — M25561 Pain in right knee: Secondary | ICD-10-CM | POA: Diagnosis not present

## 2024-06-29 DIAGNOSIS — G5611 Other lesions of median nerve, right upper limb: Secondary | ICD-10-CM | POA: Diagnosis not present

## 2024-07-02 ENCOUNTER — Ambulatory Visit (HOSPITAL_COMMUNITY)

## 2024-07-02 ENCOUNTER — Encounter (HOSPITAL_COMMUNITY): Payer: Self-pay

## 2024-08-01 DIAGNOSIS — R110 Nausea: Secondary | ICD-10-CM | POA: Diagnosis not present

## 2024-08-20 ENCOUNTER — Ambulatory Visit: Admitting: Orthopedic Surgery

## 2024-09-23 ENCOUNTER — Encounter (INDEPENDENT_AMBULATORY_CARE_PROVIDER_SITE_OTHER): Payer: Self-pay | Admitting: Gastroenterology

## 2024-12-31 ENCOUNTER — Encounter (INDEPENDENT_AMBULATORY_CARE_PROVIDER_SITE_OTHER): Payer: Self-pay | Admitting: Gastroenterology

## 2025-03-08 ENCOUNTER — Ambulatory Visit (INDEPENDENT_AMBULATORY_CARE_PROVIDER_SITE_OTHER): Admitting: Gastroenterology
